# Patient Record
Sex: Male | Born: 1945 | Race: White | Hispanic: No | Marital: Married | Smoking: Current every day smoker
Health system: Southern US, Community
[De-identification: ages and names within clinical notes are randomized; demographics above are authoritative.]

## PROBLEM LIST (undated history)

## (undated) DIAGNOSIS — L409 Psoriasis, unspecified: Secondary | ICD-10-CM

## (undated) DIAGNOSIS — J449 Chronic obstructive pulmonary disease, unspecified: Secondary | ICD-10-CM

## (undated) DIAGNOSIS — E109 Type 1 diabetes mellitus without complications: Principal | ICD-10-CM

## (undated) DIAGNOSIS — M159 Polyosteoarthritis, unspecified: Secondary | ICD-10-CM

## (undated) DIAGNOSIS — L03119 Cellulitis of unspecified part of limb: Secondary | ICD-10-CM

## (undated) DIAGNOSIS — L02419 Cutaneous abscess of limb, unspecified: Secondary | ICD-10-CM

## (undated) DIAGNOSIS — G894 Chronic pain syndrome: Secondary | ICD-10-CM

## (undated) DIAGNOSIS — K143 Hypertrophy of tongue papillae: Secondary | ICD-10-CM

## (undated) DIAGNOSIS — F17200 Nicotine dependence, unspecified, uncomplicated: Secondary | ICD-10-CM

## (undated) DIAGNOSIS — I10 Essential (primary) hypertension: Secondary | ICD-10-CM

## (undated) DIAGNOSIS — E1142 Type 2 diabetes mellitus with diabetic polyneuropathy: Secondary | ICD-10-CM

## (undated) DIAGNOSIS — M5440 Lumbago with sciatica, unspecified side: Secondary | ICD-10-CM

## (undated) DIAGNOSIS — E1149 Type 2 diabetes mellitus with other diabetic neurological complication: Secondary | ICD-10-CM

## (undated) DIAGNOSIS — I872 Venous insufficiency (chronic) (peripheral): Secondary | ICD-10-CM

## (undated) DIAGNOSIS — L301 Dyshidrosis [pompholyx]: Secondary | ICD-10-CM

## (undated) DIAGNOSIS — F411 Generalized anxiety disorder: Secondary | ICD-10-CM

## (undated) DIAGNOSIS — S93409A Sprain of unspecified ligament of unspecified ankle, initial encounter: Secondary | ICD-10-CM

## (undated) HISTORY — DX: Chronic obstructive pulmonary disease, unspecified: J44.9

## (undated) HISTORY — DX: Type 2 diabetes mellitus with other diabetic neurological complication: E11.49

## (undated) HISTORY — DX: Polyosteoarthritis, unspecified: M15.9

## (undated) HISTORY — DX: Sprain of unspecified ligament of unspecified ankle, initial encounter: S93.409A

## (undated) HISTORY — DX: Hypertrophy of tongue papillae: K14.3

## (undated) HISTORY — DX: Dyshidrosis (pompholyx): L30.1

## (undated) HISTORY — DX: Cellulitis of unspecified part of limb: L03.119

## (undated) HISTORY — DX: Generalized anxiety disorder: F41.1

## (undated) HISTORY — DX: Nicotine dependence, unspecified, uncomplicated: F17.200

## (undated) HISTORY — PX: TESTICLE REMOVAL: SHX68

## (undated) HISTORY — DX: Lumbago with sciatica, unspecified side: M54.40

## (undated) HISTORY — DX: Type 2 diabetes mellitus with diabetic polyneuropathy: E11.42

## (undated) HISTORY — DX: Type 1 diabetes mellitus without complications: E10.9

## (undated) HISTORY — DX: Psoriasis, unspecified: L40.9

## (undated) HISTORY — DX: Cutaneous abscess of limb, unspecified: L02.419

## (undated) HISTORY — DX: Chronic pain syndrome: G89.4

## (undated) HISTORY — DX: Venous insufficiency (chronic) (peripheral): I87.2

## (undated) HISTORY — DX: Essential (primary) hypertension: I10

---

## 1999-04-03 ENCOUNTER — Encounter: Admission: RE | Admit: 1999-04-03 | Discharge: 1999-07-02 | Payer: Self-pay | Admitting: Family Medicine

## 1999-08-16 ENCOUNTER — Encounter: Payer: Self-pay | Admitting: Urology

## 1999-08-16 ENCOUNTER — Ambulatory Visit (HOSPITAL_COMMUNITY): Admission: RE | Admit: 1999-08-16 | Discharge: 1999-08-16 | Payer: Self-pay | Admitting: Urology

## 1999-08-25 ENCOUNTER — Ambulatory Visit (HOSPITAL_COMMUNITY): Admission: RE | Admit: 1999-08-25 | Discharge: 1999-08-25 | Payer: Self-pay | Admitting: Urology

## 2002-02-26 ENCOUNTER — Encounter: Admission: RE | Admit: 2002-02-26 | Discharge: 2002-02-26 | Payer: Self-pay | Admitting: Family Medicine

## 2002-02-26 ENCOUNTER — Encounter: Payer: Self-pay | Admitting: Family Medicine

## 2004-03-07 ENCOUNTER — Encounter: Admission: RE | Admit: 2004-03-07 | Discharge: 2004-03-07 | Payer: Self-pay | Admitting: Family Medicine

## 2004-04-18 ENCOUNTER — Ambulatory Visit: Payer: Self-pay | Admitting: Family Medicine

## 2004-05-16 ENCOUNTER — Ambulatory Visit: Payer: Self-pay | Admitting: Family Medicine

## 2004-06-13 ENCOUNTER — Ambulatory Visit: Payer: Self-pay | Admitting: Family Medicine

## 2004-10-03 ENCOUNTER — Ambulatory Visit: Payer: Self-pay | Admitting: Family Medicine

## 2004-11-07 ENCOUNTER — Ambulatory Visit: Payer: Self-pay | Admitting: Family Medicine

## 2004-12-12 ENCOUNTER — Ambulatory Visit: Payer: Self-pay | Admitting: Family Medicine

## 2005-01-30 ENCOUNTER — Ambulatory Visit: Payer: Self-pay | Admitting: Family Medicine

## 2005-04-10 ENCOUNTER — Ambulatory Visit: Payer: Self-pay | Admitting: Family Medicine

## 2005-07-24 ENCOUNTER — Ambulatory Visit: Payer: Self-pay | Admitting: Family Medicine

## 2005-07-25 ENCOUNTER — Encounter: Payer: Self-pay | Admitting: Family Medicine

## 2005-07-25 LAB — CONVERTED CEMR LAB: PSA: 0.43 ng/mL

## 2005-07-31 ENCOUNTER — Ambulatory Visit: Payer: Self-pay | Admitting: Family Medicine

## 2005-08-07 ENCOUNTER — Ambulatory Visit: Payer: Self-pay | Admitting: Family Medicine

## 2005-08-14 ENCOUNTER — Ambulatory Visit: Payer: Self-pay | Admitting: Gastroenterology

## 2005-09-04 ENCOUNTER — Encounter (INDEPENDENT_AMBULATORY_CARE_PROVIDER_SITE_OTHER): Payer: Self-pay | Admitting: Specialist

## 2005-09-04 ENCOUNTER — Ambulatory Visit: Payer: Self-pay | Admitting: Gastroenterology

## 2005-10-02 ENCOUNTER — Encounter: Admission: RE | Admit: 2005-10-02 | Discharge: 2005-10-02 | Payer: Self-pay | Admitting: Gastroenterology

## 2005-11-06 ENCOUNTER — Ambulatory Visit: Payer: Self-pay | Admitting: Family Medicine

## 2005-12-04 ENCOUNTER — Ambulatory Visit: Payer: Self-pay | Admitting: Family Medicine

## 2006-03-05 ENCOUNTER — Ambulatory Visit: Payer: Self-pay | Admitting: Family Medicine

## 2006-03-05 LAB — CONVERTED CEMR LAB
Cholesterol: 196 mg/dL (ref 0–200)
Glucose, Bld: 79 mg/dL (ref 70–99)

## 2006-09-24 ENCOUNTER — Ambulatory Visit: Payer: Self-pay | Admitting: Family Medicine

## 2006-09-24 LAB — CONVERTED CEMR LAB
Hgb A1c MFr Bld: 7.7 % — ABNORMAL HIGH (ref 4.6–6.0)
Potassium: 4.5 meq/L (ref 3.5–5.1)

## 2006-10-15 ENCOUNTER — Ambulatory Visit: Payer: Self-pay | Admitting: Family Medicine

## 2006-10-25 ENCOUNTER — Encounter: Payer: Self-pay | Admitting: Family Medicine

## 2006-10-25 DIAGNOSIS — F411 Generalized anxiety disorder: Secondary | ICD-10-CM

## 2006-10-25 DIAGNOSIS — I10 Essential (primary) hypertension: Secondary | ICD-10-CM

## 2006-10-25 DIAGNOSIS — E1149 Type 2 diabetes mellitus with other diabetic neurological complication: Secondary | ICD-10-CM

## 2006-10-25 DIAGNOSIS — J4489 Other specified chronic obstructive pulmonary disease: Secondary | ICD-10-CM

## 2006-10-25 DIAGNOSIS — J449 Chronic obstructive pulmonary disease, unspecified: Secondary | ICD-10-CM

## 2006-10-25 DIAGNOSIS — E1159 Type 2 diabetes mellitus with other circulatory complications: Secondary | ICD-10-CM

## 2006-10-25 DIAGNOSIS — E109 Type 1 diabetes mellitus without complications: Secondary | ICD-10-CM

## 2006-10-25 HISTORY — DX: Other specified chronic obstructive pulmonary disease: J44.89

## 2006-10-25 HISTORY — DX: Essential (primary) hypertension: I10

## 2006-10-25 HISTORY — DX: Type 2 diabetes mellitus with other diabetic neurological complication: E11.49

## 2006-10-25 HISTORY — DX: Chronic obstructive pulmonary disease, unspecified: J44.9

## 2006-10-25 HISTORY — DX: Generalized anxiety disorder: F41.1

## 2006-10-25 HISTORY — DX: Type 1 diabetes mellitus without complications: E10.9

## 2006-12-16 ENCOUNTER — Ambulatory Visit: Payer: Self-pay | Admitting: Family Medicine

## 2006-12-16 DIAGNOSIS — S93409A Sprain of unspecified ligament of unspecified ankle, initial encounter: Secondary | ICD-10-CM

## 2006-12-16 HISTORY — DX: Sprain of unspecified ligament of unspecified ankle, initial encounter: S93.409A

## 2006-12-31 ENCOUNTER — Ambulatory Visit: Payer: Self-pay | Admitting: Family Medicine

## 2006-12-31 LAB — CONVERTED CEMR LAB
BUN: 8 mg/dL (ref 6–23)
CO2: 29 meq/L (ref 19–32)
Calcium: 9.3 mg/dL (ref 8.4–10.5)
Chloride: 99 meq/L (ref 96–112)
Creatinine, Ser: 0.8 mg/dL (ref 0.4–1.5)
GFR calc Af Amer: 126 mL/min
GFR calc non Af Amer: 104 mL/min
Glucose, Bld: 117 mg/dL — ABNORMAL HIGH (ref 70–99)
Hgb A1c MFr Bld: 6.7 % — ABNORMAL HIGH (ref 4.6–6.0)
Potassium: 4.4 meq/L (ref 3.5–5.1)
Sodium: 136 meq/L (ref 135–145)

## 2007-04-01 ENCOUNTER — Ambulatory Visit: Payer: Self-pay | Admitting: Family Medicine

## 2007-04-03 ENCOUNTER — Ambulatory Visit: Payer: Self-pay | Admitting: Family Medicine

## 2007-04-03 LAB — CONVERTED CEMR LAB
Alkaline Phosphatase: 26 units/L — ABNORMAL LOW (ref 39–117)
BUN: 13 mg/dL (ref 6–23)
Basophils Relative: 0.4 % (ref 0.0–1.0)
Bilirubin, Direct: 0.1 mg/dL (ref 0.0–0.3)
CO2: 30 meq/L (ref 19–32)
Cholesterol: 219 mg/dL (ref 0–200)
Eosinophils Absolute: 0.3 10*3/uL (ref 0.0–0.6)
GFR calc Af Amer: 126 mL/min
GFR calc non Af Amer: 104 mL/min
HDL: 97.2 mg/dL (ref >39.0)
Hemoglobin: 14.7 g/dL (ref 13.0–17.0)
Lymphocytes Relative: 42.7 % (ref 12.0–46.0)
MCHC: 34.4 g/dL (ref 30.0–36.0)
MCV: 99.3 fL (ref 78.0–100.0)
Microalb Creat Ratio: 14.3 mg/g (ref 0.0–30.0)
Microalb, Ur: 0.5 mg/dL (ref 0.0–1.9)
Monocytes Absolute: 0.7 10*3/uL (ref 0.2–0.7)
Monocytes Relative: 9.1 % (ref 3.0–11.0)
Neutro Abs: 3.1 10*3/uL (ref 1.4–7.7)
Potassium: 4.3 meq/L (ref 3.5–5.1)
Total Protein: 7.1 g/dL (ref 6.0–8.3)
VLDL: 11 mg/dL (ref 0–40)

## 2007-04-08 ENCOUNTER — Ambulatory Visit: Payer: Self-pay | Admitting: Family Medicine

## 2007-04-08 DIAGNOSIS — E1142 Type 2 diabetes mellitus with diabetic polyneuropathy: Secondary | ICD-10-CM

## 2007-04-08 HISTORY — DX: Type 2 diabetes mellitus with diabetic polyneuropathy: E11.42

## 2007-06-25 ENCOUNTER — Ambulatory Visit: Payer: Self-pay | Admitting: Family Medicine

## 2007-07-08 ENCOUNTER — Ambulatory Visit: Payer: Self-pay | Admitting: Family Medicine

## 2007-07-08 DIAGNOSIS — F17200 Nicotine dependence, unspecified, uncomplicated: Secondary | ICD-10-CM

## 2007-07-08 HISTORY — DX: Nicotine dependence, unspecified, uncomplicated: F17.200

## 2007-07-25 ENCOUNTER — Telehealth: Payer: Self-pay | Admitting: Family Medicine

## 2007-09-08 ENCOUNTER — Inpatient Hospital Stay (HOSPITAL_COMMUNITY): Admission: AD | Admit: 2007-09-08 | Discharge: 2007-09-12 | Payer: Self-pay | Admitting: Family Medicine

## 2007-09-08 ENCOUNTER — Ambulatory Visit: Payer: Self-pay | Admitting: Family Medicine

## 2007-09-08 DIAGNOSIS — L03119 Cellulitis of unspecified part of limb: Secondary | ICD-10-CM

## 2007-09-08 DIAGNOSIS — L02419 Cutaneous abscess of limb, unspecified: Secondary | ICD-10-CM

## 2007-09-08 HISTORY — DX: Cutaneous abscess of limb, unspecified: L02.419

## 2007-09-08 HISTORY — DX: Cellulitis of unspecified part of limb: L03.119

## 2007-09-09 ENCOUNTER — Encounter: Payer: Self-pay | Admitting: Internal Medicine

## 2007-09-09 ENCOUNTER — Ambulatory Visit: Payer: Self-pay | Admitting: Surgery

## 2007-09-15 ENCOUNTER — Ambulatory Visit: Payer: Self-pay | Admitting: Family Medicine

## 2007-09-19 ENCOUNTER — Ambulatory Visit: Payer: Self-pay | Admitting: Family Medicine

## 2007-10-15 ENCOUNTER — Telehealth: Payer: Self-pay | Admitting: *Deleted

## 2007-10-21 ENCOUNTER — Ambulatory Visit: Payer: Self-pay | Admitting: Family Medicine

## 2007-11-11 DIAGNOSIS — K143 Hypertrophy of tongue papillae: Secondary | ICD-10-CM

## 2007-11-11 HISTORY — DX: Hypertrophy of tongue papillae: K14.3

## 2007-11-25 ENCOUNTER — Ambulatory Visit: Payer: Self-pay | Admitting: Family Medicine

## 2007-11-25 DIAGNOSIS — I872 Venous insufficiency (chronic) (peripheral): Secondary | ICD-10-CM

## 2007-11-25 HISTORY — DX: Venous insufficiency (chronic) (peripheral): I87.2

## 2007-12-02 ENCOUNTER — Ambulatory Visit: Payer: Self-pay | Admitting: Family Medicine

## 2007-12-02 LAB — CONVERTED CEMR LAB
BUN: 12 mg/dL (ref 6–23)
CO2: 29 meq/L (ref 19–32)
Chloride: 99 meq/L (ref 96–112)
Creatinine, Ser: 0.8 mg/dL (ref 0.4–1.5)

## 2007-12-04 ENCOUNTER — Telehealth: Payer: Self-pay | Admitting: *Deleted

## 2008-03-05 ENCOUNTER — Telehealth: Payer: Self-pay | Admitting: Family Medicine

## 2008-03-10 ENCOUNTER — Ambulatory Visit: Payer: Self-pay | Admitting: Family Medicine

## 2008-05-11 ENCOUNTER — Telehealth: Payer: Self-pay | Admitting: *Deleted

## 2008-06-08 ENCOUNTER — Ambulatory Visit: Payer: Self-pay | Admitting: Family Medicine

## 2008-06-08 DIAGNOSIS — L301 Dyshidrosis [pompholyx]: Secondary | ICD-10-CM | POA: Insufficient documentation

## 2008-06-08 HISTORY — DX: Dyshidrosis (pompholyx): L30.1

## 2008-06-08 LAB — CONVERTED CEMR LAB
BUN: 23 mg/dL (ref 6–23)
CO2: 32 meq/L (ref 19–32)
Chloride: 101 meq/L (ref 96–112)
Creatinine, Ser: 1.1 mg/dL (ref 0.4–1.5)
Glucose, Bld: 98 mg/dL (ref 70–99)

## 2008-07-08 ENCOUNTER — Telehealth: Payer: Self-pay | Admitting: *Deleted

## 2008-08-11 ENCOUNTER — Telehealth: Payer: Self-pay | Admitting: Family Medicine

## 2008-09-30 ENCOUNTER — Ambulatory Visit: Payer: Self-pay | Admitting: Family Medicine

## 2008-09-30 ENCOUNTER — Telehealth: Payer: Self-pay | Admitting: Family Medicine

## 2008-09-30 LAB — CONVERTED CEMR LAB
Basophils Relative: 0.7 % (ref 0.0–3.0)
Bilirubin, Direct: 0.1 mg/dL (ref 0.0–0.3)
Blood in Urine, dipstick: NEGATIVE
Calcium: 9.5 mg/dL (ref 8.4–10.5)
Direct LDL: 141.8 mg/dL
Eosinophils Absolute: 0.3 10*3/uL (ref 0.0–0.7)
Eosinophils Relative: 3.9 % (ref 0.0–5.0)
GFR calc non Af Amer: 80.2 mL/min (ref >60)
Glucose, Bld: 94 mg/dL (ref 70–99)
HDL: 47.5 mg/dL (ref >39.00)
Hemoglobin: 13 g/dL (ref 13.0–17.0)
Ketones, urine, test strip: NEGATIVE
Lymphocytes Relative: 44.5 % (ref 12.0–46.0)
MCHC: 34.3 g/dL (ref 30.0–36.0)
Monocytes Relative: 8.6 % (ref 3.0–12.0)
Neutro Abs: 3 10*3/uL (ref 1.4–7.7)
Nitrite: NEGATIVE
Nitrite: NEGATIVE
Protein, U semiquant: NEGATIVE
RBC: 4.04 M/uL — ABNORMAL LOW (ref 4.22–5.81)
Sodium: 142 meq/L (ref 135–145)
Specific Gravity, Urine: <1.005
Specific Gravity, Urine: <=1.005 (ref 1.000–1.030)
Total CHOL/HDL Ratio: 4
Total Protein, Urine: NEGATIVE mg/dL
Total Protein: 7.2 g/dL (ref 6.0–8.3)
Triglycerides: 63 mg/dL (ref 0.0–149.0)
Urobilinogen, UA: 0.2
pH: 6 (ref 5.0–8.0)

## 2008-10-05 ENCOUNTER — Ambulatory Visit: Payer: Self-pay | Admitting: Family Medicine

## 2008-11-18 ENCOUNTER — Ambulatory Visit: Payer: Self-pay | Admitting: Endocrinology

## 2008-12-07 ENCOUNTER — Ambulatory Visit: Payer: Self-pay | Admitting: Endocrinology

## 2009-02-01 ENCOUNTER — Ambulatory Visit: Payer: Self-pay | Admitting: Endocrinology

## 2009-02-16 ENCOUNTER — Telehealth: Payer: Self-pay | Admitting: Family Medicine

## 2009-02-23 ENCOUNTER — Ambulatory Visit: Payer: Self-pay | Admitting: Endocrinology

## 2009-03-16 ENCOUNTER — Ambulatory Visit: Payer: Self-pay | Admitting: Endocrinology

## 2009-04-06 ENCOUNTER — Ambulatory Visit: Payer: Self-pay | Admitting: Endocrinology

## 2009-05-05 ENCOUNTER — Telehealth: Payer: Self-pay | Admitting: Family Medicine

## 2009-05-11 ENCOUNTER — Ambulatory Visit: Payer: Self-pay | Admitting: Endocrinology

## 2009-05-24 ENCOUNTER — Telehealth: Payer: Self-pay | Admitting: Family Medicine

## 2009-08-16 ENCOUNTER — Ambulatory Visit: Payer: Self-pay | Admitting: Endocrinology

## 2009-08-16 LAB — CONVERTED CEMR LAB
Creatinine,U: 27.9 mg/dL
Microalb Creat Ratio: 7.2 mg/g (ref 0.0–30.0)
Microalb, Ur: 0.2 mg/dL (ref 0.0–1.9)

## 2009-09-07 LAB — HM DIABETES EYE EXAM: HM Diabetic Eye Exam: NORMAL

## 2009-10-12 ENCOUNTER — Telehealth: Payer: Self-pay | Admitting: Family Medicine

## 2009-11-30 ENCOUNTER — Telehealth: Payer: Self-pay | Admitting: Family Medicine

## 2009-12-13 ENCOUNTER — Ambulatory Visit: Payer: Self-pay | Admitting: Endocrinology

## 2009-12-28 ENCOUNTER — Telehealth: Payer: Self-pay | Admitting: Family Medicine

## 2010-03-02 ENCOUNTER — Telehealth: Payer: Self-pay | Admitting: Endocrinology

## 2010-03-03 ENCOUNTER — Telehealth: Payer: Self-pay | Admitting: Endocrinology

## 2010-03-29 ENCOUNTER — Ambulatory Visit: Payer: Self-pay | Admitting: Family Medicine

## 2010-03-29 LAB — CONVERTED CEMR LAB
ALT: 16 units/L (ref 0–53)
AST: 20 units/L (ref 0–37)
BUN: 20 mg/dL (ref 6–23)
Creatinine,U: 125.2 mg/dL
Eosinophils Relative: 3 % (ref 0.0–5.0)
GFR calc non Af Amer: 72.26 mL/min (ref >60)
HCT: 41.9 % (ref 39.0–52.0)
Hemoglobin: 14.1 g/dL (ref 13.0–17.0)
LDL Cholesterol: 115 mg/dL — ABNORMAL HIGH (ref 0–99)
Lymphs Abs: 3.4 10*3/uL (ref 0.7–4.0)
Microalb Creat Ratio: 0.4 mg/g (ref 0.0–30.0)
Monocytes Relative: 9 % (ref 3.0–12.0)
Nitrite: NEGATIVE
Platelets: 222 10*3/uL (ref 150.0–400.0)
Potassium: 4.8 meq/L (ref 3.5–5.1)
Protein, U semiquant: NEGATIVE
RBC: 4.43 M/uL (ref 4.22–5.81)
Sodium: 139 meq/L (ref 135–145)
TSH: 0.97 microintl units/mL (ref 0.35–5.50)
Total Bilirubin: 0.4 mg/dL (ref 0.3–1.2)
Total CHOL/HDL Ratio: 3
Urobilinogen, UA: 0.2
VLDL: 15.4 mg/dL (ref 0.0–40.0)
WBC Urine, dipstick: NEGATIVE
WBC: 6.9 10*3/uL (ref 4.5–10.5)

## 2010-04-05 ENCOUNTER — Encounter: Payer: Self-pay | Admitting: Family Medicine

## 2010-04-05 ENCOUNTER — Ambulatory Visit: Payer: Self-pay | Admitting: Family Medicine

## 2010-04-07 ENCOUNTER — Telehealth: Payer: Self-pay | Admitting: Family Medicine

## 2010-04-18 ENCOUNTER — Ambulatory Visit: Payer: Self-pay | Admitting: Endocrinology

## 2010-06-27 NOTE — Progress Notes (Signed)
Summary: LANTUS  Phone Note Call from Patient Call back at Home Phone [phone] Call back at [phone]   Caller: Patient Summary of Call: NEEDS LANTUS SENT TO [facility] INSTEAD OF [facility] Initial call taken by: Hilarie Fredrickson,  March 03, 2010 11:19 AM    Prescriptions: LANTUS 100 UNIT/ML SOLN (INSULIN GLARGINE) Inject 10 units at night  #52mth x 3   Entered by:   Margaret Pyle, CMA   Authorized by:   Minus Breeding MD   Signed by:   Margaret Pyle, CMA on 03/03/2010   Method used:   Electronically to        [facility]* (retail)             ,          Ph: [phone]       Fax: [phone]   RxID:   [id number]

## 2010-06-27 NOTE — Progress Notes (Signed)
Summary: please  call  Phone Note Call from Patient Call back at Home Phone [phone]   Caller: Patient Call For: Roderick Pee MD Summary of Call: pt would liike rachel to return call Initial call taken by: Heron Sabins,  April 07, 2010 11:44 AM  Follow-up for Phone Call        [facility] please call Follow-up by: Roderick Pee MD,  April 10, 2010 8:27 AM    Prescriptions: TRIAMCINOLONE ACETONIDE 0.1 % OINT (TRIAMCINOLONE ACETONIDE) apply two times a day  #1pound jar x 3   Entered by:   Kern Reap CMA (AAMA)   Authorized by:   Roderick Pee MD   Signed by:   Kern Reap CMA (AAMA) on 04/10/2010   Method used:   Electronically to        [facility]* (retail)             ,          Ph: [phone]       Fax: [phone]   RxID:   [id number] GABAPENTIN 300 MG CAPS (GABAPENTIN) Take 1 capsule by mouth three times a day  #300 x 3   Entered by:   Kern Reap CMA (AAMA)   Authorized by:   Roderick Pee MD   Signed by:   Kern Reap CMA (AAMA) on 04/10/2010   Method used:   Electronically to        [facility]* (retail)             ,          Ph: [phone]       Fax: [phone]   RxID:   [id number] FUROSEMIDE 20 MG  TABS (FUROSEMIDE) take one tab in the evening  #100 x 3   Entered by:   Kern Reap CMA (AAMA)   Authorized by:   Roderick Pee MD   Signed by:   Kern Reap CMA (AAMA) on 04/10/2010   Method used:   Electronically to        [facility]* (retail)             ,          Ph: [phone]       Fax: [phone]   RxID:   [id number] IBUPROFEN 800 MG TABS (IBUPROFEN) Take 1 tablet by mouth twice a day  #200 x 3   Entered by:   Kern Reap CMA (AAMA)   Authorized by:   Roderick Pee MD   Signed by:   Kern Reap CMA (AAMA) on 04/10/2010   Method used:   Electronically to        [facility]* (retail)             ,          Ph: [phone]       Fax: [phone]   RxID:   [id number]

## 2010-06-27 NOTE — Progress Notes (Signed)
Summary: accu-chek tests strips refill  Phone Note Refill Request Message from:  Fax from Pharmacy on December 28, 2009 12:52 PM  Refills Requested: Medication #1:  ACCU-CHEK AVIVA  STRP use four times a day to check glucose levels Initial call taken by: Kern Reap CMA Duncan Dull),  December 28, 2009 12:52 PM    Prescriptions: ACCU-CHEK AVIVA  STRP (GLUCOSE BLOOD) use four times a day to check glucose levels  #360 x 3   Entered by:   Kern Reap CMA (AAMA)   Authorized by:   Roderick Pee MD   Signed by:   Kern Reap CMA (AAMA) on 12/28/2009   Method used:   Electronically to        [facility]* (retail)       [address]       [place], [place]  [id number]       Ph: [phone]       Fax: [phone]   RxID:   [id number]

## 2010-06-27 NOTE — Progress Notes (Signed)
Summary: REFILL REQUEST  Phone Note Refill Request Message from:  Patient on November 30, 2009 3:36 PM  Refills Requested: Medication #1:  VICODIN ES 7.5-750 MG  TABS Take 1 tablet by mouth three times a day   Notes: Please fax to [facility].    Initial call taken by: Debbra Riding,  November 30, 2009 3:37 PM  Follow-up for Phone Call        Fleet Contras, pls  research this Follow-up by: Roderick Pee MD,  December 01, 2009 11:10 AM  Additional Follow-up for Phone Call Additional follow up Details #1::        patient was taking darvocet for pain but rx was changed to viocodin.  the last fill was 12/10.  he will come to the office to pick this rx up. Additional Follow-up by: Kern Reap CMA Duncan Dull),  December 01, 2009 2:39 PM    Additional Follow-up for Phone Call Additional follow up Details #2::    okay to refill enough until next physical Follow-up by: Roderick Pee MD,  December 02, 2009 8:46 AM  Additional Follow-up for Phone Call Additional follow up Details #3:: Details for Additional Follow-up Action Taken: faxed to [name] Additional Follow-up by: Kern Reap CMA Duncan Dull),  December 02, 2009 3:02 PM  Prescriptions: VICODIN ES 7.5-750 MG  TABS (HYDROCODONE-ACETAMINOPHEN) Take 1 tablet by mouth three times a day  #90 x 3   Entered by:   Kern Reap CMA (AAMA)   Authorized by:   Roderick Pee MD   Signed by:   Kern Reap CMA (AAMA) on 12/02/2009   Method used:   Print then Give to Patient   RxID:   [id number]

## 2010-06-27 NOTE — Assessment & Plan Note (Signed)
Summary: 3 MOS F/U PER PT/CD   Vital Signs:  Patient profile:   65 year old male Height:      72 inches (182.88 cm) Weight:      199.50 pounds (90.68 kg) O2 Sat:      99 % on Room air Temp:     97.8 degrees F (36.56 degrees C) oral Pulse rate:   84 / minute BP sitting:   128 / 64  (left arm) Cuff size:   regular  Vitals Entered By: Josph Macho RMA (August 16, 2009 10:27 AM)  O2 Flow:  Room air CC: 3 month follow up/ Pt states he is no longer taking Darvocet/ CF Is Patient Diabetic? Yes   Referring Provider:  Dr Mohammed Kindle Primary Provider:  Dr Mohammed Kindle  CC:  3 month follow up/ Pt states he is no longer taking Darvocet/ CF.  History of Present Illness: he brings a record of his cbg's which i have reviewed today.  pt states he feels well in general, except for hypoglycemia in the middle of the night, approx 1/week.  he still takes lantus 15 units at bedtime. pt states few years of severe itching of the legs.  there is an intermittently associated rash.  Current Medications (verified): 1)  Accu-Chek Multiclix Lancets  Misc (Lancets) .... Use 1 Needle Three Times A Day 2)  Amitriptyline Hcl 10 Mg Tabs (Amitriptyline Hcl) .Marland Kitchen.. 1 Tablet By Mouth At Bedtime 3)  Buspirone Hcl 30 Mg Tabs (Buspirone Hcl) .... Take 1 Tablet By Mouth Twice A Day 4)  Clonidine Hcl 0.1 Mg Tabs (Clonidine Hcl) .... Take 1 Tablet By Mouth Every Night 5)  Gabapentin 300 Mg Caps (Gabapentin) .... Take 1 Capsule By Mouth Three Times A Day 6)  Ibuprofen 800 Mg Tabs (Ibuprofen) .... Take 1 Tablet By Mouth Twice A Day 7)  Lantus 100 Unit/ml Soln (Insulin Glargine) .... Inject 10 Units At Night 8)  Lisinopril 40 Mg Tabs (Lisinopril) .Marland Kitchen.. 1 Tablet By Mouth Twice A Day 9)  Rhinocort Aqua 32 Mcg/act Susp (Budesonide (Nasal)) .... Inhale 2 Spray Into Both Nostrils Once A Day 10)  Darvocet-N 100 100-650 Mg  Tabs (Propoxyphene N-Apap) .... One Three Times Daily 11)  Adult Aspirin Low Strength 81 Mg  Tbdp (Aspirin)  .... One  Mon & Fri 12)  Vicodin Es 7.5-750 Mg  Tabs (Hydrocodone-Acetaminophen) .... Take 1 Tablet By Mouth Three Times A Day 13)  Furosemide 20 Mg  Tabs (Furosemide) .... Take One Tab in The Evening 14)  Zyrtec Allergy 10 Mg  Tabs (Cetirizine Hcl) .... Once Daily 15)  Bd Insulin Syringe Ultrafine 31g X 5/16" 0.5 Ml  Misc (Insulin Syringe-Needle U-100) .... Qid 16)  Accu-Chek Aviva  Strp (Glucose Blood) .... Use Four Times A Day To Check Glucose Levels 17)  Lidex 0.05 % Crea (Fluocinonide) .... Apply At Bedtime 18)  Triamcinolone Acetonide 0.1 % Oint (Triamcinolone Acetonide) .... Apply Two Times A Day 19)  Novolog 100 Unit/ml Soln (Insulin Aspart) .... Three Times A Day (Just Before Each Meal) 05-08-13 Units  Allergies (verified): No Known Drug Allergies  Past History:  Past Medical History: Last updated: 10/25/2006 Diabetes mellitus, type I PSORIASIS TA COPD PERIPHERAL NEUROPATHY Hypertension Anxiety  Review of Systems  The patient denies syncope.    Physical Exam  General:  normal appearance.   Skin:  on the legs, i do not see a rash Additional Exam:   Hemoglobin A1C       [H]  7.8 %   Impression & Recommendations:  Problem # 1:  DIABETES MELLITUS, TYPE I (ICD-250.01) needs increased rx  Problem # 2:  DYSHIDROSIS (ICD-705.81) needs increased rx  Medications Added to Medication List This Visit: 1)  Humalog 100 Unit/ml Soln (Insulin lispro (human)) .... Three times a day (just before each meal) 05-08-13 units 2)  Humalog 100 Unit/ml Soln (Insulin lispro (human)) .... Three times a day (just before each meal) 13-13-15 units 3)  Fluocinonide 0.05 % Crea (Fluocinonide) .... Three times a day as needed for itching  Other Orders: TLB-A1C / Hgb A1C (Glycohemoglobin) (83036-A1C) TLB-Microalbumin/Creat Ratio, Urine (82043-MALB) Est. Patient Level IV (54098)  Patient Instructions: 1)  reduce lantus to 10 units at night only. 2)  increase humalog to just before each  meal 05-08-13 units. 3)  Please schedule a follow-up appointment in 3 months. 4)  call sooner if sugar is below 70. 5)  check your blood sugar 4 times a day--before the 3 meals, and at bedtime.  also check if you have symptoms of your blood sugar being too high or too low.  please keep a record of the readings and bring it to your next appointment here.  please call us sooner if you are having low blood sugar episodes. 6)  lidex cream three times a day as needed for itching 7)  (update: i left message on phone-tree:  lastus as above.  increase humalog to (just before each meal) 13-13-15 units). Prescriptions: HUMALOG 100 UNIT/ML SOLN (INSULIN LISPRO (HUMAN)) three times a day (just before each meal) 05-08-13 units  #4 vials x 3   Entered and Authorized by:   Minus Breeding MD   Signed by:   Minus Breeding MD on 08/16/2009   Method used:   Electronically to        [facility]* (mail-order)             ,          Ph: [phone]       Fax: [phone]   RxID:   [id number] FLUOCINONIDE 0.05 % CREA (FLUOCINONIDE) three times a day as needed for itching  #1 large tube x 3   Entered and Authorized by:   Minus Breeding MD   Signed by:   Minus Breeding MD on 08/16/2009   Method used:   Electronically to        [facility]* (mail-order)             ,          Ph: [phone]       Fax: [phone]   RxID:   [id number]

## 2010-06-27 NOTE — Progress Notes (Signed)
Summary: refill request  Phone Note Refill Request   Refills Requested: Medication #1:  AMITRIPTYLINE HCL 10 MG TABS 1 tablet by mouth at bedtime  Medication #2:  CLONIDINE HCL 0.1 MG TABS Take 1 tablet by mouth every night  Medication #3:  IBUPROFEN 800 MG TABS Take 1 tablet by mouth twice a day Initial call taken by: Kern Reap CMA Duncan Dull),  Oct 12, 2009 11:44 AM    Prescriptions: IBUPROFEN 800 MG TABS (IBUPROFEN) Take 1 tablet by mouth twice a day  #200 x 11   Entered by:   Kern Reap CMA (AAMA)   Authorized by:   Roderick Pee MD   Signed by:   Kern Reap CMA (AAMA) on 10/12/2009   Method used:   Electronically to        [facility]* (mail-order)             ,          Ph: [phone]       Fax: [phone]   RxID:   [id number] CLONIDINE HCL 0.1 MG TABS (CLONIDINE HCL) Take 1 tablet by mouth every night  #100 x 4   Entered by:   Kern Reap CMA (AAMA)   Authorized by:   Roderick Pee MD   Signed by:   Kern Reap CMA (AAMA) on 10/12/2009   Method used:   Electronically to        [facility]* (mail-order)             ,          Ph: [phone]       Fax: [phone]   RxID:   [id number] AMITRIPTYLINE HCL 10 MG TABS (AMITRIPTYLINE HCL) 1 tablet by mouth at bedtime  #100 x 4   Entered by:   Kern Reap CMA (AAMA)   Authorized by:   Roderick Pee MD   Signed by:   Kern Reap CMA (AAMA) on 10/12/2009   Method used:   Electronically to        [facility]* (mail-order)             ,          Ph: [phone]       Fax: [phone]   RxID:   [id number]

## 2010-06-27 NOTE — Assessment & Plan Note (Signed)
Summary: cpx labs v70.0/njr   Vital Signs:  Patient profile:   65 year old male Height:      72 inches Weight:      196 pounds BMI:     26.68 Temp:     98.6 degrees F oral BP sitting:   128 / 80  (left arm) Cuff size:   regular  Vitals Entered By: Kern Reap CMA Duncan Dull) (April 05, 2010 10:01 AM) CC: cpx   Primary Care Provider:  Dr Mohammed Kindle  CC:  cpx.  History of Present Illness: Jeremy Gill is a 65 year old male, smoker, type I diabetic with underlying hypertension, COPD, neuropathy from diabetes, who comes in today for general physical examination  His medications were reviewed in detail.  He is currently seeing Dr. Everardo All for treatment of his diabetes.  He takes 10 units of Lantus at bedtime and Humalog sliding scale prior to each meal.  However, blood sugar daily.  He shows me there is tremendous sleep.  In the morning.  His blood sugar is 260 in the afternoon at 72 and then at night time.  Its to 80.  The rest of his medications are static.  He continues to smoke and declines a smoking cessation program.  Tetanus 2003, Pneumovax 2000, seasonal flu shot today.  Preventive Screening-Counseling & Management  Alcohol-Tobacco     Smoking Cessation Counseling: yes  Allergies: No Known Drug Allergies  Past History:  Past medical, surgical, family and social histories (including risk factors) reviewed, and no changes noted (except as noted below).  Past Medical History: Reviewed history from 10/25/2006 and no changes required. Diabetes mellitus, type I PSORIASIS TA COPD PERIPHERAL NEUROPATHY Hypertension Anxiety  Past Surgical History: Reviewed history from 10/25/2006 and no changes required. R TESTICLE REMOVED  Family History: Reviewed history from 11/18/2008 and no changes required. father died in his 62s, unknown cause mother died at 64 CNS aneurysm, cancer, unknown primary 4 brothers, two in good health.  One died of old age 27 was killed 3 sisters  one died of old age.  Two, alive and well sister has dm  Social History: Reviewed history from 11/18/2008 and no changes required. Married Current Smoker Alcohol use-no Drug use-no Regular exercise-no self-employed  Review of Systems      See HPI       Flu Vaccine Consent Questions     Do you have a history of severe allergic reactions to this vaccine? no    Any prior history of allergic reactions to egg and/or gelatin? no    Do you have a sensitivity to the preservative Thimersol? no    Do you have a past history of Guillan-Barre Syndrome? no    Do you currently have an acute febrile illness? no    Have you ever had a severe reaction to latex? no    Vaccine information given and explained to patient? yes    Are you currently pregnant? no    Lot Number:[id number]   Exp Date:11/25/2010   Site Given  Left Deltoid IM   Physical Exam  General:  Well-developed,well-nourished,in no acute distress; alert,appropriate and cooperative throughout examination Head:  Normocephalic and atraumatic without obvious abnormalities. No apparent alopecia or balding. Eyes:  No corneal or conjunctival inflammation noted. EOMI. Perrla. Funduscopic exam benign, without hemorrhages, exudates or papilledema. Vision grossly normal. Ears:  External ear exam shows no significant lesions or deformities.  Otoscopic examination reveals clear canals, tympanic membranes are intact bilaterally without bulging, retraction, inflammation or discharge.  Hearing is grossly normal bilaterally. Nose:  External nasal examination shows no deformity or inflammation. Nasal mucosa are pink and moist without lesions or exudates. Mouth:  Oral mucosa and oropharynx without lesions or exudates.  Teeth in good repair. Neck:  No deformities, masses, or tenderness noted. Chest Wall:  No deformities, masses, tenderness or gynecomastia noted. Breasts:  No masses or gynecomastia noted Lungs:  Normal respiratory effort, chest expands  symmetrically. Lungs are clear to auscultation, no crackles or wheezes. Heart:  Normal rate and regular rhythm. S1 and S2 normal without gallop, murmur, click, rub or other extra sounds. Abdomen:  Bowel sounds positive,abdomen soft and non-tender without masses, organomegaly or hernias noted. Rectal:  No external abnormalities noted. Normal sphincter tone. No rectal masses or tenderness. Genitalia:  left testicle normal.  Right was surgically removed Prostate:  Prostate gland firm and smooth, no enlargement, nodularity, tenderness, mass, asymmetry or induration. Msk:  No deformity or scoliosis noted of thoracic or lumbar spine.   Pulses:  R and L carotid,radial,femoral,dorsalis pedis and posterior tibial pulses are full and equal bilaterally Extremities:  No clubbing, cyanosis, edema, or deformity noted with normal full range of motion of all joints.   Neurologic:  No cranial nerve deficits noted. Station and gait are normal. Plantar reflexes are down-going bilaterally. DTRs are symmetrical throughout. Sensory, motor and coordinative functions appear intact. Skin:  Intact without suspicious lesions or rashes Cervical Nodes:  No lymphadenopathy noted Axillary Nodes:  No palpable lymphadenopathy Inguinal Nodes:  No significant adenopathy Psych:  Cognition and judgment appear intact. Alert and cooperative with normal attention span and concentration. No apparent delusions, illusions, hallucinations  Diabetes Management Exam:    Foot Exam (with socks and/or shoes not present):       Sensory-Pinprick/Light touch:          Left medial foot (L-4): normal          Left dorsal foot (L-5): normal          Left lateral foot (S-1): normal          Right medial foot (L-4): normal          Right dorsal foot (L-5): normal          Right lateral foot (S-1): normal       Sensory-Monofilament:          Left foot: normal          Right foot: normal       Inspection:          Left foot: normal           Right foot: normal       Nails:          Left foot: normal          Right foot: normal    Eye Exam:       Eye Exam done elsewhere          Date: 09/07/2009          Results: normal          Done by: opth   Impression & Recommendations:  Problem # 1:  TOBACCO ABUSE (ICD-305.1) Assessment Unchanged  Orders: Prescription Created Electronically [phone]) Tobacco use cessation intermediate 3-10 minutes (60454)  Problem # 2:  HYPERTENSION (ICD-401.9) Assessment: Improved  His updated medication list for this problem includes:    Clonidine Hcl 0.1 Mg Tabs (Clonidine hcl) .Marland Kitchen... Take 1 tablet by mouth every night    Lisinopril 40  Mg Tabs (Lisinopril) .Marland Kitchen... 1 tablet by mouth twice a day    Furosemide 20 Mg Tabs (Furosemide) .Marland Kitchen... Take one tab in the evening  Orders: Prescription Created Electronically [phone]) Tobacco use cessation intermediate 3-10 minutes (99406) EKG w/ Interpretation (93000)  Problem # 3:  DIABETES MELLITUS, TYPE I (ICD-250.01) Assessment: Unchanged  His updated medication list for this problem includes:    Lantus 100 Unit/ml Soln (Insulin glargine) ..... Inject 10 units at night    Lisinopril 40 Mg Tabs (Lisinopril) .Marland Kitchen... 1 tablet by mouth twice a day    Adult Aspirin Low Strength 81 Mg Tbdp (Aspirin) ..... One  mon & fri    Humalog 100 Unit/ml Soln (Insulin lispro (human)) .Marland Kitchen... Three times a day (just before each meal) 13-13-15 units  Orders: Prescription Created Electronically [phone]) Tobacco use cessation intermediate 3-10 minutes (57846)  Problem # 4:  Preventive Health Care (ICD-V70.0) Assessment: Unchanged  Complete Medication List: 1)  Accu-chek Multiclix Lancets Misc (Lancets) .... Use 1 needle three times a day 2)  Amitriptyline Hcl 10 Mg Tabs (Amitriptyline hcl) .Marland Kitchen.. 1 tablet by mouth at bedtime 3)  Buspirone Hcl 30 Mg Tabs (Buspirone hcl) .... Take 1 tablet by mouth twice a day 4)  Clonidine Hcl 0.1 Mg Tabs (Clonidine hcl) .... Take 1 tablet by  mouth every night 5)  Gabapentin 300 Mg Caps (Gabapentin) .... Take 1 capsule by mouth three times a day 6)  Ibuprofen 800 Mg Tabs (Ibuprofen) .... Take 1 tablet by mouth twice a day 7)  Lantus 100 Unit/ml Soln (Insulin glargine) .... Inject 10 units at night 8)  Lisinopril 40 Mg Tabs (Lisinopril) .Marland Kitchen.. 1 tablet by mouth twice a day 9)  Rhinocort Aqua 32 Mcg/act Susp (Budesonide (nasal)) .... Inhale 2 spray into both nostrils once a day 10)  Adult Aspirin Low Strength 81 Mg Tbdp (Aspirin) .... One  mon & fri 11)  Vicodin Es 7.5-750 Mg Tabs (Hydrocodone-acetaminophen) .... Take 1 tablet by mouth three times a day 12)  Furosemide 20 Mg Tabs (Furosemide) .... Take one tab in the evening 13)  Zyrtec Allergy 10 Mg Tabs (Cetirizine hcl) .... Once daily 14)  Bd Insulin Syringe Ultrafine 31g X 5/16" 0.5 Ml Misc (Insulin syringe-needle u-100) .... Qid 15)  Accu-chek Aviva Strp (Glucose blood) .... Use four times a day to check glucose levels 16)  Triamcinolone Acetonide 0.1 % Oint (Triamcinolone acetonide) .... Apply two times a day 17)  Humalog 100 Unit/ml Soln (Insulin lispro (human)) .... Three times a day (just before each meal) 13-13-15 units 18)  Fluocinonide 0.05 % Crea (Fluocinonide) .... Three times a day as needed for itching  Other Orders: Admin 1st Vaccine (96295) Flu Vaccine 69yrs + (28413)  Patient Instructions: 1)  Please schedule a follow-up appointment in 1 year. 2)  Tobacco is very bad for your health and your loved ones! You Should stop smoking!. 3)  Stop Smoking Tips: Choose a Quit date. Cut down before the Quit date. decide what you will do as a substitute when you feel the urge to smoke(gum,toothpick,exercise). 4)  It is important that you exercise regularly at least 20 minutes 5 times a week. If you develop chest pain, have severe difficulty breathing, or feel very tired , stop exercising immediately and seek medical attention. 5)  Take an Aspirin every day. 6)  Check your blood  sugars regularly. If your readings are usually above : or below 70 you should contact our office. 7)  It is important that  your Diabetic A1c level is checked every 3 months. 8)  See your eye doctor yearly to check for diabetic eye damage. 9)  Check your feet each night for sore areas, calluses or signs of infection. 10)  Check your Blood Pressure regularly. If it is above: you should make an appointment. Prescriptions: VICODIN ES 7.5-750 MG  TABS (HYDROCODONE-ACETAMINOPHEN) Take 1 tablet by mouth three times a day  #90 x 3   Entered and Authorized by:   Roderick Pee MD   Signed by:   Roderick Pee MD on 04/05/2010   Method used:   Printed then faxed to ...       [facility]* (retail)       [address]       [place], [place]  [address]       Ph: [phone]       Fax: [phone]   RxID:   [id number] TRIAMCINOLONE ACETONIDE 0.1 % OINT (TRIAMCINOLONE ACETONIDE) apply two times a day  #1 x 3   Entered and Authorized by:   Roderick Pee MD   Signed by:   Roderick Pee MD on 04/05/2010   Method used:   Electronically to        [facility]* (retail)       [address]       [place], [place]  [address]       Ph: [phone]       Fax: [phone]   RxID:   [id number] FUROSEMIDE 20 MG  TABS (FUROSEMIDE) take one tab in the evening  #100 x 3   Entered and Authorized by:   Roderick Pee MD   Signed by:   Roderick Pee MD on 04/05/2010   Method used:   Electronically to        [facility]* (retail)       [address]       [place], [place]  [address]       Ph: [phone]       Fax: [phone]   RxID:   [id number] RHINOCORT AQUA 32 MCG/ACT SUSP (BUDESONIDE (NASAL)) Inhale 2 spray into both nostrils once a day  #3 x 4   Entered and Authorized by:   Roderick Pee MD   Signed by:   Roderick Pee MD on 04/05/2010   Method used:   Electronically to        [facility]* (retail)       [address]       [place], [place]  [address]       Ph: [phone]       Fax: [phone]   RxID:   [id number] LISINOPRIL 40 MG TABS (LISINOPRIL) 1 tablet by mouth twice a day  #200 x 3   Entered and Authorized by:   Roderick Pee MD   Signed by:   Roderick Pee MD on 04/05/2010   Method used:   Electronically to        [facility]* (retail)       [address]       [place], [place]  [address]       Ph: [phone]       Fax: [phone]   RxID:   [id number] IBUPROFEN 800 MG TABS (IBUPROFEN) Take 1 tablet by mouth twice a day  #200 x 3   Entered and Authorized by:   Roderick Pee MD  Signed by:   Roderick Pee MD on 04/05/2010   Method used:   Electronically to        [facility]* (retail)       [address]       [place], [place]  [address]       Ph: [phone]       Fax: [phone]   RxID:   [id number] GABAPENTIN 300 MG CAPS (GABAPENTIN) Take 1 capsule by mouth three times a day  #300 x 3   Entered and Authorized by:   Roderick Pee MD   Signed by:   Roderick Pee MD on 04/05/2010   Method used:   Electronically to        [facility]* (retail)       [address]       [place], [place]  [address]       Ph: [phone]       Fax: [phone]   RxID:   [id number] CLONIDINE HCL 0.1 MG TABS (CLONIDINE HCL) Take 1 tablet by mouth every night  #100 x 3   Entered and Authorized by:   Roderick Pee MD   Signed by:   Roderick Pee MD on 04/05/2010   Method used:   Electronically to        [facility]* (retail)       [address]       [place], [place]  [address]       Ph: [phone]       Fax: [phone]   RxID:   [id number] BUSPIRONE HCL 30 MG TABS (BUSPIRONE HCL) Take 1 tablet by mouth twice a day  #200 x 3   Entered and Authorized by:   Roderick Pee MD   Signed by:   Roderick Pee MD on 04/05/2010   Method used:   Electronically to        [facility]* (retail)        [address]       [place], [place]  [address]       Ph: [phone]       Fax: [phone]   RxID:   [id number] AMITRIPTYLINE HCL 10 MG TABS (AMITRIPTYLINE HCL) 1 tablet by mouth at bedtime  #100 x 3   Entered and Authorized by:   Roderick Pee MD   Signed by:   Roderick Pee MD on 04/05/2010   Method used:   Electronically to        [facility]* (retail)       [address]       [place], [place]  [address]       Ph: [phone]       Fax: [phone]   RxID:   [id number]    Orders Added: 1)  Prescription Created Electronically [G8553] 2)  Est. Patient 40-64 years [99396] 3)  Tobacco use cessation intermediate 3-10 minutes [99406] 4)  EKG w/ Interpretation [93000] 5)  Admin 1st Vaccine [90471] 6)  Flu Vaccine 48yrs + [82505]

## 2010-06-27 NOTE — Assessment & Plan Note (Signed)
Summary: 4 MO ROV /NWS   Vital Signs:  Patient profile:   65 year old male Height:      72 inches (182.88 cm) Weight:      197.25 pounds (89.66 kg) BMI:     26.85 O2 Sat:      97 % on Room air Temp:     98.8 degrees F (37.11 degrees C) oral Pulse rate:   104 / minute BP sitting:   122 / 74  (left arm) Cuff size:   regular  Vitals Entered By: Brenton Grills MA (December 13, 2009 10:08 AM)  O2 Flow:  Room air CC: 4 mo F/U appt/aj Is Patient Diabetic? Yes Comments Pt wants rx refills of Accu-Check Lancets, Buspirone, Gabapentin, Furosemide, BD Insulin Syringes, and Accu-Check Test Strips to be printed out and given to him   Referring Provider:  Dr Mohammed Kindle Primary Provider:  Dr Mohammed Kindle  CC:  4 mo F/U appt/aj.  History of Present Illness: he brings a record of his cbg's which i have reviewed today.  pt states he feels well in general, except for hypoglycemia in the middle of the night, approx 1/week.  he still takes lantus 15 units at bedtime.   Current Medications (verified): 1)  Accu-Chek Multiclix Lancets  Misc (Lancets) .... Use 1 Needle Three Times A Day 2)  Amitriptyline Hcl 10 Mg Tabs (Amitriptyline Hcl) .Marland Kitchen.. 1 Tablet By Mouth At Bedtime 3)  Buspirone Hcl 30 Mg Tabs (Buspirone Hcl) .... Take 1 Tablet By Mouth Twice A Day 4)  Clonidine Hcl 0.1 Mg Tabs (Clonidine Hcl) .... Take 1 Tablet By Mouth Every Night 5)  Gabapentin 300 Mg Caps (Gabapentin) .... Take 1 Capsule By Mouth Three Times A Day 6)  Ibuprofen 800 Mg Tabs (Ibuprofen) .... Take 1 Tablet By Mouth Twice A Day 7)  Lantus 100 Unit/ml Soln (Insulin Glargine) .... Inject 10 Units At Night 8)  Lisinopril 40 Mg Tabs (Lisinopril) .Marland Kitchen.. 1 Tablet By Mouth Twice A Day 9)  Rhinocort Aqua 32 Mcg/act Susp (Budesonide (Nasal)) .... Inhale 2 Spray Into Both Nostrils Once A Day 10)  Adult Aspirin Low Strength 81 Mg  Tbdp (Aspirin) .... One  Mon & Fri 11)  Vicodin Es 7.5-750 Mg  Tabs (Hydrocodone-Acetaminophen) .... Take 1  Tablet By Mouth Three Times A Day 12)  Furosemide 20 Mg  Tabs (Furosemide) .... Take One Tab in The Evening 13)  Zyrtec Allergy 10 Mg  Tabs (Cetirizine Hcl) .... Once Daily 14)  Bd Insulin Syringe Ultrafine 31g X 5/16" 0.5 Ml  Misc (Insulin Syringe-Needle U-100) .... Qid 15)  Accu-Chek Aviva  Strp (Glucose Blood) .... Use Four Times A Day To Check Glucose Levels 16)  Lidex 0.05 % Crea (Fluocinonide) .... Apply At Bedtime 17)  Triamcinolone Acetonide 0.1 % Oint (Triamcinolone Acetonide) .... Apply Two Times A Day 18)  Humalog 100 Unit/ml Soln (Insulin Lispro (Human)) .... Three Times A Day (Just Before Each Meal) 13-13-15 Units 19)  Fluocinonide 0.05 % Crea (Fluocinonide) .... Three Times A Day As Needed For Itching  Allergies (verified): No Known Drug Allergies  Past History:  Past Medical History: Last updated: 10/25/2006 Diabetes mellitus, type I PSORIASIS TA COPD PERIPHERAL NEUROPATHY Hypertension Anxiety  Review of Systems  The patient denies syncope.    Physical Exam  General:  normal appearance.   Skin:  insulin injection sites at anterior abdomen are normal, except for a few ecchymoses Additional Exam:  Hemoglobin A1C       [  H]  8.1 %   Impression & Recommendations:  Problem # 1:  DIABETES MELLITUS, TYPE I (ICD-250.01) needs increased rx  Other Orders: TLB-A1C / Hgb A1C (Glycohemoglobin) (83036-A1C) Est. Patient Level III ([address])  Patient Instructions: 1)  blood tests are being ordered for you today.  please call [phone] to hear your test results. 2)  pending the test results, please: 3)  reduce lantus to 10 units at night only, and: 4)  increase humalog to just before each meal 13-13-15 units. 5)  Please schedule a follow-up appointment in 3 months. 6)  call sooner if sugar is below 70. 7)  check your blood sugar 4 times a day--before the 3 meals, and at bedtime.  also check if you have symptoms of your blood sugar being too high or too low.  please keep a  record of the readings and bring it to your next appointment here.  please call us sooner if you are having low blood sugar episodes. 8)  please consider referral to a diabetes educator to consider a pump and/or continuous glucose monitor.   9)  you are due for a physical with dr todd.  please call for an appointment. 10)  (update: i left message on phone-tree:  rx as we discussed) Prescriptions: ACCU-CHEK AVIVA  STRP (GLUCOSE BLOOD) use four times a day to check glucose levels  #360 x 3   Entered and Authorized by:   Minus Breeding MD   Signed by:   Minus Breeding MD on 12/13/2009   Method used:   Print then Give to Patient   RxID:   [id number] INSULIN SYRINGE ULTRAFINE 31G X 5/16" 0.5 ML  MISC (INSULIN SYRINGE-NEEDLE U-100) qid  #360 x 3   Entered and Authorized by:   Minus Breeding MD   Signed by:   Minus Breeding MD on 12/13/2009   Method used:   Print then Give to Patient   RxID:   [id number] ACCU-CHEK MULTICLIX LANCETS  MISC (LANCETS) Use 1 needle three times a day  #270 x 3   Entered and Authorized by:   Minus Breeding MD   Signed by:   Minus Breeding MD on 12/13/2009   Method used:   Print then Give to Patient   RxID:   [id number] FUROSEMIDE 20 MG  TABS (FUROSEMIDE) take one tab in the evening  #100 x 0   Entered and Authorized by:   Minus Breeding MD   Signed by:   Minus Breeding MD on 12/13/2009   Method used:   Print then Give to Patient   RxID:   [id number] GABAPENTIN 300 MG CAPS (GABAPENTIN) Take 1 capsule by mouth three times a day  #300 x 0   Entered and Authorized by:   Minus Breeding MD   Signed by:   Minus Breeding MD on 12/13/2009   Method used:   Print then Give to Patient   RxID:   [id number] BUSPIRONE HCL 30 MG TABS (BUSPIRONE HCL) Take 1 tablet by mouth twice a day  #200 x 0   Entered and Authorized by:   Minus Breeding MD   Signed by:   Minus Breeding MD on 12/13/2009   Method used:   Print then Give to Patient   RxID:    [id number]

## 2010-06-27 NOTE — Assessment & Plan Note (Signed)
Summary: FU / NWS   Vital Signs:  Patient profile:   65 year old male Height:      72 inches (182.88 cm) Weight:      199 pounds (90.45 kg) BMI:     27.09 O2 Sat:      97 % on Room air Temp:     98.7 degrees F (37.06 degrees C) oral Pulse rate:   86 / minute BP sitting:   120 / 70  (left arm) Cuff size:   regular  Vitals Entered By: Brenton Grills CMA Duncan Dull) (April 18, 2010 10:06 AM)  O2 Flow:  Room air CC: Follow-up visit/rx for Busiprone/aj Is Patient Diabetic? Yes   Referring Provider:  Dr Mohammed Kindle Primary Provider:  Dr Mohammed Kindle  CC:  Follow-up visit/rx for Busiprone/aj.  History of Present Illness: for now, pt wants to defer insulin pump for financial reasons.  he eats 2 meals/day, at 1 pm, and 10 pm.  pt states he feels well in general.  he brings a record of his cbg's which i have reviewed today.  it varies from 200's (am) to low-100's before lunch.  it is variable, though.  he does not check after his evening meal, which is late in the evening.     Current Medications (verified): 1)  Accu-Chek Multiclix Lancets  Misc (Lancets) .... Use 1 Needle Three Times A Day 2)  Amitriptyline Hcl 10 Mg Tabs (Amitriptyline Hcl) .Marland Kitchen.. 1 Tablet By Mouth At Bedtime 3)  Buspirone Hcl 30 Mg Tabs (Buspirone Hcl) .... Take 1 Tablet By Mouth Twice A Day 4)  Clonidine Hcl 0.1 Mg Tabs (Clonidine Hcl) .... Take 1 Tablet By Mouth Every Night 5)  Gabapentin 300 Mg Caps (Gabapentin) .... Take 1 Capsule By Mouth Three Times A Day 6)  Ibuprofen 800 Mg Tabs (Ibuprofen) .... Take 1 Tablet By Mouth Twice A Day 7)  Lantus 100 Unit/ml Soln (Insulin Glargine) .... Inject 10 Units At Night 8)  Lisinopril 40 Mg Tabs (Lisinopril) .Marland Kitchen.. 1 Tablet By Mouth Twice A Day 9)  Rhinocort Aqua 32 Mcg/act Susp (Budesonide (Nasal)) .... Inhale 2 Spray Into Both Nostrils Once A Day 10)  Adult Aspirin Low Strength 81 Mg  Tbdp (Aspirin) .... One  Mon & Fri 11)  Vicodin Es 7.5-750 Mg  Tabs  (Hydrocodone-Acetaminophen) .... Take 1 Tablet By Mouth Three Times A Day 12)  Furosemide 20 Mg  Tabs (Furosemide) .... Take One Tab in The Evening 13)  Zyrtec Allergy 10 Mg  Tabs (Cetirizine Hcl) .... Once Daily 14)  Bd Insulin Syringe Ultrafine 31g X 5/16" 0.5 Ml  Misc (Insulin Syringe-Needle U-100) .... Qid 15)  Accu-Chek Aviva  Strp (Glucose Blood) .... Use Four Times A Day To Check Glucose Levels 16)  Triamcinolone Acetonide 0.1 % Oint (Triamcinolone Acetonide) .... Apply Two Times A Day 17)  Humalog 100 Unit/ml Soln (Insulin Lispro (Human)) .... Three Times A Day (Just Before Each Meal) 13-13-15 Units 18)  Fluocinonide 0.05 % Crea (Fluocinonide) .... Three Times A Day As Needed For Itching  Allergies (verified): No Known Drug Allergies  Past History:  Past Medical History: Last updated: 10/25/2006 Diabetes mellitus, type I PSORIASIS TA COPD PERIPHERAL NEUROPATHY Hypertension Anxiety  Review of Systems  The patient denies hypoglycemia.    Physical Exam  General:  normal appearance.   Pulses:  dorsalis pedis intact bilat.   Extremities:  no deformity.  no ulcer on the feet.  feet are of normal color and temp.  no  edema. there is bilat onychomycosis. Neurologic:  cn 2-12 grossly intact.   readily moves all 4's.   sensation is intact to touch on the feet, but decreased from normal. Additional Exam:  a1c=9.3   Impression & Recommendations:  Problem # 1:  DIABETES MELLITUS, TYPE I (ICD-250.01) needs increased rx  Medications Added to Medication List This Visit: 1)  Lantus 100 Unit/ml Soln (Insulin glargine) .... Inject 12 units at night 2)  Triamcinolone Acetonide 0.1 % Oint (Triamcinolone acetonide) .... Two times a day 1:1 compound with eucerin 3)  Humalog 100 Unit/ml Soln (Insulin lispro (human)) .Marland Kitchen.. 15 units just before each meal.  Other Orders: Est. Patient Level III (16109)  Patient Instructions: 1)  pending knowing what your blood sugar is after your  evening meal, please: 2)  increase lantus to 12 units at night only, and: 3)  take humalog 15 units, just before each meal 4)  Please schedule a follow-up appointment in 3 months. 5)  check your blood sugar 4 times a day--before meals, and at bedtime.  also check if you have symptoms of your blood sugar being too high or too low.  please keep a record of the readings and bring it to your next appointment here.  please call us sooner if you are having low blood sugar episodes.   6)  please call the customer service phone number on your health insurance card, to see what your copay would be for a pump, and a continuous glucose monitor. Prescriptions: BUSPIRONE HCL 30 MG TABS (BUSPIRONE HCL) Take 1 tablet by mouth twice a day  #200 x 3   Entered and Authorized by:   Minus Breeding MD   Signed by:   Minus Breeding MD on 04/18/2010   Method used:   Faxed to ...       [facility] (mail-order)             , [place]         Ph: [phone]       Fax: [phone]   RxID:   [id number] TRIAMCINOLONE ACETONIDE 0.1 % OINT (TRIAMCINOLONE ACETONIDE) two times a day 1:1 compound with eucerin  #1 lb tub x 1   Entered and Authorized by:   Minus Breeding MD   Signed by:   Minus Breeding MD on 04/18/2010   Method used:   Faxed to ...       [facility] (mail-order)             , [place]         Ph: [phone]       Fax: [phone]   RxID:   [id number]    Orders Added: 1)  Est. Patient Level III [32951]

## 2010-06-27 NOTE — Progress Notes (Signed)
Summary: Rx refill req  Phone Note Call from Patient Call back at Home Phone [phone]   Caller: Patient--[phone] Call For: [place] Summary of Call: Pt needs a refill of Insulin, almost out. Set designer.  Initial call taken by: Verdell Face,  March 02, 2010 11:19 AM    Prescriptions: HUMALOG 100 UNIT/ML SOLN (INSULIN LISPRO (HUMAN)) three times a day (just before each meal) 13-13-15 units  #69mth x 1   Entered by:   Margaret Pyle, CMA   Authorized by:   Minus Breeding MD   Signed by:   Margaret Pyle, CMA on 03/02/2010   Method used:   Electronically to        [facility] (retail)             ,          Ph: [phone]       Fax: [phone]   RxID:   [id number]

## 2010-06-27 NOTE — Miscellaneous (Signed)
Summary: [facility], [facility]   Imported By: Lester York 08/26/2009 08:16:23  _____________________________________________________________________  External Attachment:    Type:   Image     Comment:   External Document

## 2010-10-10 NOTE — H&P (Signed)
NAME:  JAMIER, URBAS NO.:  [id number]   MEDICAL RECORD NO.:  [id number]          PATIENT TYPE:  INP   LOCATION:  1310                         FACILITY:  [facility]   PHYSICIAN:  Eugenio Hoes. 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   Patient states he felt well until Friday when he noticed his right leg  had become red and swollen.  He does not recall any history of trauma.  It stayed like this over the weekend.  He also noted Friday his blood  sugar was over 400.  He came to the office today with that complaint and  was immediately admitted to [facility] for pancultures, IV  fluids, IV antibiotics, and control of his marked hyperglycemia.   He has had no previous hospitalizations.  He had a fractured ankle as an  outpatient.   PAST ILLNESSES:  None.   INJURIES:  None.   DRUG ALLERGIES:  None.   He continues to smoke a pipe.  He drinks about 12 ounces of beer daily.   CURRENT MEDICATIONS:  1. Elavil 10 mg q.h.s. for the diabetic neuropathy.  2. BuSpar 30 mg b.i.d. for anxiety.  3. Clonidine 0.1 daily.  4. Lasix 20 at noon, 40 in the morning.  5. Lisinopril 40 b.i.d. for hypertension.  6. Gabapentin 300 mg t.i.d. for neuropathy.  7. Glipizide 10 mg b.i.d.  8. Lantus 10 units b.i.d.  9. Lisinopril as noted above.  10.Rhinocort Aqua nasal spray for allergic rhinitis.  11.Metformin 1000 mg b.i.d.  12.Darvocet-N 100 three times a day as needed for pain.   REVIEW OF  SYSTEMS:  He wears glasses for distance vision, gets yearly  eye exams, otherwise ENT negative.  He does not get regular dental care.  CARDIOPULMONARY:  Negative.  GI:  Negative.  His last colonoscopy was April of 2007 by Dr. Russella Dar  showed a couple colon polyps and hemorrhoids, otherwise negative.  GU:  Negative except for prostatitis x1.  ENDO:  Negative.  Long-term diabetes.  Recently, in the past year and a  half, converted to a type 1, well controlled with Lantus.  His last  hemoglobin A1c was 7.7, March of 2008.  It then dropped down to 7.1 with  improved therapy.   The rest of review of systems negative.   SOCIAL HISTORY:  He works in Airline pilot.  He enjoys playing pool as a hobby.  Married and 2 children.   FAMILY HISTORY:  Dad died in his 52s, unknown cause.  Mother died in her  49s, CNS aneurysm, cancer, unknown primary.  Four brothers, 1 died and  was killed, 1 died of old age, 1 died of unknown cause, the other is in  good health.  Three sisters, 1 died of old age, the other 2 are in good  health.   VACCINATION HISTORY:  Last tetanus, 2003.  Pneumovax, 2008.  Flu shot,  fall of 2008.   PHYSICAL EXAM:  The weight was 185.  Temp 98.  Pulse 70 and regular.  BP  140/70.  GENERAL:  He is a well-developed, well-nourished, white male in no acute  distress.  HEAD, EYES, EARS, NOSE, AND THROAT:  Negative except for stained teeth  from chronic pipe use.  CARDIOPULMONARY:  Negative except for symmetrical decreased breath  sounds.  ABDOMEN:  Negative.  EXTREMITIES:  Normal except for right lower extremity is red, hot,  swollen.  Blood sugar 400, as noted above.   IMPRESSION:  1. Cellulitis, right lower extremity.  2. Diabetes, type 1, out of control.  3. History of hypertension.  4. Neuropathy.  5. Tobacco abuse.  6. Chronic obstructive pulmonary disease.   PLAN:  Admit.  IV fluids.  Cultures.  Start IV Timentin 3.1 g q.h. after  cultures done.  Also, increase Lantus to 20  b.i.d.  If this does not  control his blood sugar, then obviously we will need to go to a sliding  scale.      Jeffrey A. Tawanna Cooler, MD  Electronically Signed     JAT/MEDQ  D:  09/08/2007  T:  09/08/2007  Job:  [id number]

## 2010-10-13 NOTE — Op Note (Signed)
[place]  Patient:    Jeremy Gill, Jeremy Gill                      MRN: [id number] Proc. Date: 08/25/99 Adm. Date:  [id number] Attending:  Ellwood Handler Dictator:   Verl Dicker, M.D. CC:         Evette Georges, M.D. [facility]             Verl Dicker, M.D.                           Operative Report  DATE OF BIRTH:  1945/08/04  REFERRING PHYSICIAN:  Evette Georges, M.D. [facility]  UROLOGIST:  Verl Dicker, M.D.  INDICATIONS:  The patient is a 65 year old male recently diagnosed with diabetes, history of undescended testes and phimosis.  The patient is having progressive ain in the right groin around the right testis, and is having progressive difficulties retracting the foreskin.  CT scan of the abdomen and pelvis on 08/16/99 at [facility] "undescended right testis within the right inguinal canal, no evidence of pelvic or abdominal adenopathy or retroperitoneal mass, PSA 0.3, AFP 1, beta HCG undetectable".  PREOPERATIVE DIAGNOSIS:  Undescended testes, phimosis.  POSTOPERATIVE DIAGNOSIS:  Undescended testes, phimosis.  PROCEDURE:  Right inguinal orchiectomy, circumcision.  ANESTHESIA:  General.  DRAINS:  None.  COMPLICATIONS:  None.  DESCRIPTION OF PROCEDURE:  The patient was prepped and draped in the supine position after institution of an adequate level of general anesthesia.  A transverse incision was made over the right inguinal canal through the skin and  subcutaneous tissue.  Fibers of the external oblique were easily identified. Incision made in the direction of the fibers.  Testes easily identified within he inguinal canal.  Dissection carried along the cord to the level of the internal  inguinal ring.  Cord was then crossed clamped with large Hemostats x 2, and then divided.  Cord was ligated with stick ties of 0 chromic x 2.  The floor of the canal was then closed with interrupted  sutures of 0 Vicryl.  The external oblique was closed with a running suture of 0 Vicryl.  Skin was closed in a subcuticular fashion with 4-0 Dexon.  An incision was then made in the distal aspect of the penile shaft, proximal to the subcoronal sulcus.  A similar incision was made proximal to the original incision, and a ring of fibrotic purpucial skin was removed in a "parallel lines technique". Bleeding sites were lightly fulgurated using needle tip cautery.  Skin edges were reapproximated with interrupted sutures of 3-0 chromic.  The wound was covered ith dry gauze, bacitracin ointment, and Coban tape.  The patient was returned to recovery in satisfactory condition. DD:  08/25/99 TD:  08/25/99 Job: [id number]

## 2010-10-13 NOTE — Discharge Summary (Signed)
NAME:  Jeremy Gill, Jeremy Gill NO.:  [id number]   MEDICAL RECORD NO.:  [id number]          PATIENT TYPE:  INP   LOCATION:  1310                         FACILITY:  [facility]   PHYSICIAN:  Valerie A. Felicity Coyer, MDDATE OF BIRTH:  [birth year]   DATE OF ADMISSION:  09/08/2007  DATE OF DISCHARGE:  09/12/2007                               DISCHARGE SUMMARY   DISCHARGE DIAGNOSES:  1. Right lower extremity cellulitis.  2. Uncontrolled type 2 diabetes.  3. Mild hyponatremia.   HISTORY OF PRESENT ILLNESS:  Jeremy Gill is a 65 year old white male  with a history of diabetes,  anxiety, hypertension and polyneuropathy.  He presented to the office on the day of admission with pain and  swelling of his right lower extremity that started on Friday prior to  admission.  The patient denied any recent trauma to area.  Also upon  evaluation in the ER, the patient found to have a CBG of over 400, at  which time the patient was transferred to the hospital for further  evaluation and treatment.   PAST MEDICAL HISTORY:  1. Diabetes.  2. Anxiety.  3. Hypertension.  4. 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  5. COPD with ongoing tobacco abuse.   COURSE OF HOSPITALIZATION:  The patient admitted to the hospital from  the office and started on IV antibiotics.  Blood cultures obtained at  time of admission, which were negative for any growth.  The patient's  symptoms responded well to antibiotic treatment.  The patient with  decreased white cell count and decreased swelling and erythema to  affected extremity with IV antibiotics.  A venous Doppler obtained,  which was negative for any DVT bilaterally in lower extremities.  The  patient's diabetes exacerbated by infection.  The patient's insulin  doses adjusted accordingly throughout hospitalization.  The patient  easily transitioned from IV antibiotics to p.o. antibiotics, including  Augmentin and doxycycline for MRSA coverage; however, there was   very  low suspicion for MRSA due to decreased white cell count with initial  antibiotic treatment, but, due to the patient's diabetes, double  coverage was ordered.  The patient felt medically stable for discharge  on September 12, 2007, with very close followup with primary care physician.   MEDICATIONS AT THE TIME OF DISCHARGE:  1. Augmentin 875 mg p.o. b.i.d. until gone.  2. Doxycycline 100 mg p.o. b.i.d. until gone.  3. Metformin 1000 mg p.o. b.i.d.  4. Glucotrol XL 10 mg p.o. b.i.d.  5. Lantus insulin 15 units b.i.d.  Note this is a new dose for this      patient.  6. Lisinopril 40 mg p.o. daily.  7. Clonidine 0.1 mg p.o. daily.  8. Neurontin 300 mg p.o. t.i.d.  9. Lasix 40 mg p.o. q.a.m., 20 mg p.o. q.h.s.  10.BuSpar 30 mg p.o. b.i.d.  11.Aspirin 81 mg p.o. daily.  12.Amitriptyline 10 mg p.o. q.h.s.  13.Zyrtec 10 mg p.o. daily.   PERTINENT LABORATORY WORK AT TIME OF DISCHARGE:  White cell count 8.1,  platelet count 311, hemoglobin 9.8, hematocrit 28.6, sodium 133,  potassium 4.0, BUN 14, creatinine  0.89.  As mentioned above, blood  cultures were negative for any growth x2.  Anemia panel done during this  admission revealed iron level of 25 with percent saturation of 11, TIBC  223, vitamin B12 of 283, ferritin 184.  Total cholesterol 135,  triglycerides 51, HDL 47, LDL of 78.   DISPOSITION:  Again, the patient felt medically stable for discharge  home with close outpatient followup.  The patient is scheduled to follow  up with his primary care physician, Dr. Kelle Darting, on April 20 at  10:00 a.m.  The patient also instructed to maintain a low-sodium, low-  sugar diet and to keep right leg elevated when at rest.      Cordelia Pen, NP      Raenette Rover. Felicity Coyer, MD  Electronically Signed    LE/MEDQ  D:  09/24/2007  T:  09/24/2007  Job:  [id number]   cc:   Tinnie Gens A. Tawanna Cooler, MD  [address] [place]  [place] [address]

## 2010-10-30 ENCOUNTER — Telehealth: Payer: Self-pay | Admitting: Family Medicine

## 2010-10-30 NOTE — Telephone Encounter (Signed)
Refills okay for one year or until next complete physical

## 2010-10-30 NOTE — Telephone Encounter (Signed)
Refill Hydrocodone,Amitriptyline for mail order. Pt will pick up. Wants Fleet Contras to call him about a handicap form.

## 2010-11-01 ENCOUNTER — Telehealth: Payer: Self-pay | Admitting: Family Medicine

## 2010-11-01 MED ORDER — HYDROCODONE-ACETAMINOPHEN 7.5-750 MG PO TABS: 1.0000 | ORAL_TABLET | Freq: Three times a day (TID) | ORAL | Status: DC

## 2010-11-01 MED ORDER — AMITRIPTYLINE HCL 10 MG PO TABS: 10.0000 mg | ORAL_TABLET | Freq: Every day | ORAL | Status: DC

## 2010-11-01 NOTE — Telephone Encounter (Signed)
Spoke with patient.

## 2010-11-01 NOTE — Telephone Encounter (Signed)
Pt would like for only Fleet Contras to return his call, regarding the message he left on 10-30-2010 with Aram Beecham. Thanks.

## 2010-11-02 NOTE — Telephone Encounter (Signed)
rx faxed

## 2011-02-20 LAB — CBC
Hemoglobin: 11.9 — ABNORMAL LOW
Hemoglobin: 10.5 — ABNORMAL LOW
MCHC: 34.6
MCHC: 33.9
MCHC: 34.2
Platelets: 246
RBC: 2.96 — ABNORMAL LOW
RDW: 12.9
RDW: 12.9
RDW: 13.1

## 2011-02-20 LAB — LIPID PANEL
Cholesterol: 135
HDL: 47
LDL Cholesterol: 78
Total CHOL/HDL Ratio: 2.9
Triglycerides: 51

## 2011-02-20 LAB — RETICULOCYTES: Retic Count, Absolute: 18.7 — ABNORMAL LOW

## 2011-02-20 LAB — BASIC METABOLIC PANEL
BUN: 15
CO2: 28
CO2: 29
Calcium: 8.5
Calcium: 8.9
Creatinine, Ser: 0.85
Creatinine, Ser: 0.89
GFR calc Af Amer: >60
GFR calc non Af Amer: >60
GFR calc non Af Amer: >60
Glucose, Bld: 57 — ABNORMAL LOW

## 2011-02-20 LAB — COMPREHENSIVE METABOLIC PANEL
AST: 18
Albumin: 3.2 — ABNORMAL LOW
Alkaline Phosphatase: 29 — ABNORMAL LOW
CO2: 27
Chloride: 90 — ABNORMAL LOW
GFR calc non Af Amer: >60
Potassium: 4.7
Total Bilirubin: 0.9

## 2011-02-20 LAB — URINALYSIS, ROUTINE W REFLEX MICROSCOPIC
Bilirubin Urine: NEGATIVE
Leukocytes, UA: NEGATIVE
Nitrite: NEGATIVE
Specific Gravity, Urine: 1.017
Urobilinogen, UA: 1
pH: 6.5

## 2011-02-20 LAB — IRON AND TIBC
Iron: 25 — ABNORMAL LOW
Saturation Ratios: 11 — ABNORMAL LOW
TIBC: 223

## 2011-02-20 LAB — CULTURE, BLOOD (ROUTINE X 2)

## 2011-02-20 LAB — FERRITIN: Ferritin: 184 (ref 22–322)

## 2011-03-09 ENCOUNTER — Other Ambulatory Visit: Payer: Self-pay | Admitting: Family Medicine

## 2011-03-09 MED ORDER — GLUCOSE BLOOD VI STRP: ORAL_STRIP | Status: AC

## 2011-03-09 MED ORDER — ACCU-CHEK MULTICLIX LANCETS MISC: Status: AC

## 2011-03-09 NOTE — Telephone Encounter (Signed)
rx sent in electronically 

## 2011-03-09 NOTE — Telephone Encounter (Signed)
Pt called req refill of Accuchek Aviva Test Strip #120 check glucose 4 times a day. Pt also needs Accu-chek Multiclix Lancets #270 w/ 3 refills. Pls call in to [facility] mail order pharmacy. Pt is almost out of strips and lancets. Pls call in asap today.

## 2011-04-26 ENCOUNTER — Ambulatory Visit (INDEPENDENT_AMBULATORY_CARE_PROVIDER_SITE_OTHER): Payer: 59 | Admitting: *Deleted

## 2011-04-26 DIAGNOSIS — Z23 Encounter for immunization: Secondary | ICD-10-CM

## 2011-04-30 ENCOUNTER — Other Ambulatory Visit: Payer: Self-pay | Admitting: Family Medicine

## 2011-05-02 ENCOUNTER — Other Ambulatory Visit: Payer: Self-pay | Admitting: *Deleted

## 2011-05-02 MED ORDER — "INSULIN SYRINGE-NEEDLE U-100 31G X 5/16"" 0.5 ML MISC": 1.0000 | Freq: Every day | Status: DC

## 2011-05-02 MED ORDER — BUSPIRONE HCL 30 MG PO TABS: 30.0000 mg | ORAL_TABLET | Freq: Two times a day (BID) | ORAL | Status: DC

## 2011-05-02 MED ORDER — GABAPENTIN 300 MG PO CAPS: 300.0000 mg | ORAL_CAPSULE | Freq: Three times a day (TID) | ORAL | Status: DC

## 2011-05-02 MED ORDER — AMITRIPTYLINE HCL 10 MG PO TABS: 10.0000 mg | ORAL_TABLET | Freq: Every day | ORAL | Status: DC

## 2011-05-02 NOTE — Telephone Encounter (Signed)
Patient is requesting a refill of Ibuprofen 800 mg sent to [facility]

## 2011-05-03 NOTE — Telephone Encounter (Signed)
ok 

## 2011-05-04 MED ORDER — IBUPROFEN 800 MG PO TABS: 800.0000 mg | ORAL_TABLET | Freq: Three times a day (TID) | ORAL | Status: DC | PRN

## 2011-05-04 NOTE — Telephone Encounter (Signed)
rx faxed

## 2011-05-11 ENCOUNTER — Other Ambulatory Visit: Payer: Self-pay

## 2011-05-11 MED ORDER — IBUPROFEN 800 MG PO TABS: 800.0000 mg | ORAL_TABLET | Freq: Three times a day (TID) | ORAL | Status: DC | PRN

## 2011-05-15 ENCOUNTER — Other Ambulatory Visit: Payer: Self-pay | Admitting: Family Medicine

## 2011-05-15 NOTE — Telephone Encounter (Signed)
Pt need new rx ibuprofen 800mg  twice a day [facility] [phone]. Fleet Contras please call pt today.

## 2011-05-18 ENCOUNTER — Other Ambulatory Visit: Payer: Self-pay | Admitting: Endocrinology

## 2011-05-21 NOTE — Telephone Encounter (Signed)
[facility]oke with [facility] and prescription was sent 05/16/11

## 2011-07-10 ENCOUNTER — Other Ambulatory Visit (INDEPENDENT_AMBULATORY_CARE_PROVIDER_SITE_OTHER): Payer: 59

## 2011-07-10 ENCOUNTER — Encounter: Payer: Self-pay | Admitting: Endocrinology

## 2011-07-10 ENCOUNTER — Ambulatory Visit (INDEPENDENT_AMBULATORY_CARE_PROVIDER_SITE_OTHER): Payer: 59 | Admitting: Endocrinology

## 2011-07-10 VITALS — BP 138/72 | HR 88 | Temp 97.1°F | Ht 71.0 in | Wt 189.0 lb

## 2011-07-10 DIAGNOSIS — E109 Type 1 diabetes mellitus without complications: Secondary | ICD-10-CM

## 2011-07-10 MED ORDER — INSULIN GLARGINE 100 UNIT/ML ~~LOC~~ SOLN: 12.0000 [IU] | Freq: Every day | SUBCUTANEOUS | Status: DC

## 2011-07-10 MED ORDER — INSULIN LISPRO 100 UNIT/ML ~~LOC~~ SOLN: SUBCUTANEOUS | Status: DC

## 2011-07-10 NOTE — Patient Instructions (Addendum)
increase lantus to 12 units at night only, and: take humalog 12 units, just before each meal. Please schedule a follow-up appointment in 1 month. check your blood sugar 4 times a day--before meals, and at bedtime.  also check if you have symptoms of your blood sugar being too high or too low.  please keep a record of the readings and bring it to your next appointment here.  please call us sooner if you are having low blood sugar episodes. good diet and exercise habits significanly improve the control of your diabetes.  please let me know if you wish to be referred to a dietician.  high blood sugar is very risky to your health.  you should see an eye doctor every year. controlling your blood pressure and cholesterol drastically reduces the damage diabetes does to your body.  this also applies to quitting smoking.  please discuss these with your doctor.  you should take an aspirin every day, unless you have been advised by a doctor not to. blood tests are being requested for you today.  please call [phone] to hear your test results.  You will be prompted to enter the 9-digit "MRN" number that appears at the top left of this page, followed by #.  Then you will hear the message.

## 2011-07-10 NOTE — Progress Notes (Signed)
  Subjective:    Patient ID: Jeremy Gill, male    DOB: 06-28-1945, 66 y.o.   MRN: [id number]  HPI Pt returns for f/u of adult-onset type 1 DM (2000).  pt states he feels well in general.  He seldom takes humalog, and he takes a varying dosage of lantus.  he brings a record of his cbg's which i have reviewed today.  It varies from 70-300.  It is in general higher as the day goes on.  Past Medical History  Diagnosis Date  . DIABETES MELLITUS, TYPE I 10/25/2006  . ANXIETY 10/25/2006  . COPD 10/25/2006  . HYPERTENSION 10/25/2006  . 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 Polyneuropathy in diabetes 04/08/2007  . VENOUS INSUFFICIENCY 11/25/2007  . TOBACCO ABUSE 07/08/2007  . Psoriasis     Past Surgical History  Procedure Date  . Testicle removal     R testicle    History   Social History  . Marital Status: Married    Spouse Name: N/A    Number of Children: N/A  . Years of Education: N/A   Occupational History  . Self Employed    Social History Main Topics  . Smoking status: Former Smoker    Quit date: 05/29/2011  . Smokeless tobacco: Not on file  . Alcohol Use: No  . Drug Use: No  . Sexually Active: Not on file   Other Topics Concern  . Not on file   Social History Narrative   Regular exercise-no    Current Outpatient Prescriptions on File Prior to Visit  Medication Sig Dispense Refill  . amitriptyline (ELAVIL) 10 MG tablet Take 1 tablet (10 mg total) by mouth at bedtime.  90 tablet  3  . busPIRone (BUSPAR) 30 MG tablet Take 1 tablet (30 mg total) by mouth 2 (two) times daily.  180 tablet  3  . gabapentin (NEURONTIN) 300 MG capsule Take 1 capsule (300 mg total) by mouth 3 (three) times daily.  270 capsule  3  . glucose blood (ACCU-CHEK AVIVA) test strip Use as directed  270 each  3  . Insulin Syringe-Needle U-100 (B-D INS SYRINGE 0.5CC/31GX5/16) 31G X 5/16" 0.5 ML MISC 1 each by Does not apply route daily.  100 each  3  . Lancets (ACCU-CHEK MULTICLIX) lancets Use as directed  300  each  3    No Known Allergies  Family History  Problem Relation Age of Onset  . Cancer Mother     Unknown   . Diabetes Sister     BP 138/72  Pulse 88  Temp(Src) 97.1 F (36.2 C) (Oral)  Ht 5\' 11"  (1.803 m)  Wt 189 lb (85.73 kg)  BMI 26.36 kg/m2  SpO2 98%   Review of Systems Denies loc    Objective:   Physical Exam VITAL SIGNS:  See vs page GENERAL: no distress Pulses: dorsalis pedis intact bilat.   Feet: no deformity.  no ulcer on the feet.  feet are of normal color and temp.  no edema. Neuro: sensation is intact to touch on the feet, but decreased from normal.    Lab Results  Component Value Date   HGBA1C 10.2* 07/10/2011      Assessment & Plan:  DM, needs increased rx

## 2011-07-17 ENCOUNTER — Telehealth: Payer: Self-pay | Admitting: Family Medicine

## 2011-07-17 NOTE — Telephone Encounter (Signed)
Patient called stating that he called the pharmacy to get a refill on his oxycodone and was told that his doctors office needed to call in narcotics. Patient needs a refill on his oxycodone 7.5/750v/35 also his ibuprofen 800mg . Please assist and inform patient when done.

## 2011-07-18 MED ORDER — HYDROCODONE-ACETAMINOPHEN 7.5-750 MG PO TABS: 1.0000 | ORAL_TABLET | Freq: Three times a day (TID) | ORAL | Status: DC | PRN

## 2011-07-18 MED ORDER — IBUPROFEN 800 MG PO TABS: 800.0000 mg | ORAL_TABLET | Freq: Two times a day (BID) | ORAL | Status: DC

## 2011-07-18 NOTE — Telephone Encounter (Signed)
Fleet Contras please refill his Vicodin ES #90 one half to one tablet 3 times daily when necessary for pain no refills he needs a 30 minute appointment sometime in the next month for general medical exam since we've not seen him in over a year

## 2011-08-07 ENCOUNTER — Ambulatory Visit (INDEPENDENT_AMBULATORY_CARE_PROVIDER_SITE_OTHER): Payer: 59 | Admitting: Family Medicine

## 2011-08-07 ENCOUNTER — Encounter: Payer: Self-pay | Admitting: Family Medicine

## 2011-08-07 DIAGNOSIS — E1142 Type 2 diabetes mellitus with diabetic polyneuropathy: Secondary | ICD-10-CM

## 2011-08-07 DIAGNOSIS — K143 Hypertrophy of tongue papillae: Secondary | ICD-10-CM

## 2011-08-07 DIAGNOSIS — M549 Dorsalgia, unspecified: Secondary | ICD-10-CM

## 2011-08-07 DIAGNOSIS — L301 Dyshidrosis [pompholyx]: Secondary | ICD-10-CM

## 2011-08-07 DIAGNOSIS — I10 Essential (primary) hypertension: Secondary | ICD-10-CM

## 2011-08-07 DIAGNOSIS — I872 Venous insufficiency (chronic) (peripheral): Secondary | ICD-10-CM

## 2011-08-07 DIAGNOSIS — N138 Other obstructive and reflux uropathy: Secondary | ICD-10-CM

## 2011-08-07 DIAGNOSIS — J449 Chronic obstructive pulmonary disease, unspecified: Secondary | ICD-10-CM

## 2011-08-07 DIAGNOSIS — N401 Enlarged prostate with lower urinary tract symptoms: Secondary | ICD-10-CM

## 2011-08-07 DIAGNOSIS — M1990 Unspecified osteoarthritis, unspecified site: Secondary | ICD-10-CM

## 2011-08-07 DIAGNOSIS — Z0000 Encounter for general adult medical examination without abnormal findings: Secondary | ICD-10-CM

## 2011-08-07 DIAGNOSIS — E109 Type 1 diabetes mellitus without complications: Secondary | ICD-10-CM

## 2011-08-07 DIAGNOSIS — F17200 Nicotine dependence, unspecified, uncomplicated: Secondary | ICD-10-CM

## 2011-08-07 DIAGNOSIS — Z23 Encounter for immunization: Secondary | ICD-10-CM

## 2011-08-07 LAB — HEPATIC FUNCTION PANEL
ALT: 19 U/L (ref 0–53)
AST: 16 U/L (ref 0–37)
Alkaline Phosphatase: 25 U/L — ABNORMAL LOW (ref 39–117)
Bilirubin, Direct: 0.1 mg/dL (ref 0.0–0.3)
Total Protein: 6.9 g/dL (ref 6.0–8.3)

## 2011-08-07 LAB — LIPID PANEL
Cholesterol: 196 mg/dL (ref 0–200)
VLDL: 8.2 mg/dL (ref 0.0–40.0)

## 2011-08-07 LAB — CBC WITH DIFFERENTIAL/PLATELET
Basophils Absolute: 0 10*3/uL (ref 0.0–0.1)
Eosinophils Absolute: 0.1 10*3/uL (ref 0.0–0.7)
Lymphs Abs: 2.8 10*3/uL (ref 0.7–4.0)
MCHC: 33.4 g/dL (ref 30.0–36.0)
MCV: 93.9 fl (ref 78.0–100.0)
Monocytes Absolute: 0.5 10*3/uL (ref 0.1–1.0)
Neutrophils Relative %: 47.2 % (ref 43.0–77.0)
Platelets: 193 10*3/uL (ref 150.0–400.0)
RDW: 13.6 % (ref 11.5–14.6)
WBC: 6.5 10*3/uL (ref 4.5–10.5)

## 2011-08-07 LAB — BASIC METABOLIC PANEL
BUN: 20 mg/dL (ref 6–23)
Creatinine, Ser: 1 mg/dL (ref 0.4–1.5)
GFR: 80.41 mL/min (ref >60.00)
Glucose, Bld: 265 mg/dL — ABNORMAL HIGH (ref 70–99)

## 2011-08-07 LAB — HEMOGLOBIN A1C: Hgb A1c MFr Bld: 9.7 % — ABNORMAL HIGH (ref 4.6–6.5)

## 2011-08-07 LAB — POCT URINALYSIS DIPSTICK
Leukocytes, UA: NEGATIVE
Protein, UA: NEGATIVE
Urobilinogen, UA: 0.2
pH, UA: 6

## 2011-08-07 LAB — MICROALBUMIN / CREATININE URINE RATIO: Microalb, Ur: 0.9 mg/dL (ref 0.0–1.9)

## 2011-08-07 MED ORDER — HYDROCODONE-ACETAMINOPHEN 7.5-750 MG PO TABS: 1.0000 | ORAL_TABLET | Freq: Three times a day (TID) | ORAL | Status: DC

## 2011-08-07 MED ORDER — IBUPROFEN 400 MG PO TABS: ORAL_TABLET | ORAL | Status: DC

## 2011-08-07 NOTE — Progress Notes (Signed)
Subjective:    Patient ID: Jeremy Gill, male    DOB: [birth year], 66 y.o.   MRN: [id number]  HPI Jeremy Gill is a 66 year old single male,,,, X. smoker times 06/12/2011,,,,,,,, who comes in today for a Medicare wellness examination because of a history of diabetes type 1, hypertension, osteoarthritis, neuropathy, venous insufficiency, anxiety, sleep dysfunction  His medications were reviewed in detail there've been no changes the only recommendation I would have would be to decrease his Motrin to no more than 400 twice a day. He's currently taking 800 mg 3 times a day which increases his chance to GI bleed and renal failure also the NSAIDs can cause elevated blood pressure  He takes Vicodin 1 tablet 3 times daily for back pain.  His blood pressure today was 170/98. He states his blood pressures at home are Gill normal 130/80  He is followed by Dr. Everardo Gill endocrinology for his diabetes  He gets routine eye care, poor dentition no dental care, hearing normal, although I think it Jeremy Gill managed, cognitive function normal home health safety reviewed no issues identified, he does not exercise on a regular basis, no guns in the house, he does have a health care power of attorney and living well tetanus 2003 Pneumovax x2 seasonal flu shot 2012 information given on shingles   Review of Systems  Constitutional: Negative.   HENT: Negative.   Eyes: Negative.   Respiratory: Negative.   Cardiovascular: Negative.   Gastrointestinal: Negative.   Genitourinary: Negative.   Musculoskeletal: Negative.   Skin: Negative.   Neurological: Negative.   Hematological: Negative.   Psychiatric/Behavioral: Negative.        Objective:   Physical Exam  Constitutional: He is oriented to person, place, and time. He appears well-developed and well-nourished.  HENT:  Head: Normocephalic and atraumatic.  Right Ear: External ear normal.  Left Ear: External ear normal.  Nose: Nose normal.  Mouth/Throat: Oropharynx  is clear and moist.       Extremely poor dentition referred to Dr. dentist for complete evaluation  Eyes: Conjunctivae and EOM are normal. Pupils are equal, round, and reactive to light.  Neck: Normal range of motion. Neck supple. No JVD present. No tracheal deviation present. No thyromegaly present.  Cardiovascular: Normal rate, regular rhythm, normal heart sounds and intact distal pulses.  Exam reveals no gallop and no friction rub.   No murmur heard. Pulmonary/Chest: Effort normal and breath sounds normal. No stridor. No respiratory distress. He has no wheezes. He has no rales. He exhibits no tenderness.  Abdominal: Soft. Bowel sounds are normal. He exhibits no distension and no mass. There is no tenderness. There is no rebound and no guarding.  Genitourinary: Rectum normal, prostate normal and penis normal. Guaiac negative stool. No penile tenderness.  Musculoskeletal: Normal range of motion. He exhibits no edema and no tenderness.  Lymphadenopathy:    He has no cervical adenopathy.  Neurological: He is alert and oriented to person, place, and time. He has normal reflexes. No cranial nerve deficit. He exhibits normal muscle tone.  Skin: Skin is warm and dry. No rash noted. No erythema. No pallor.  Psychiatric: He has a normal mood and affect. His behavior is normal. Judgment and thought content normal.          Assessment & Plan:  Diabetes type 1 managed by Dr. Everardo Gill  Chronic back pain and osteoarthritis continue Motrin but decrease dose to 400 twice a day Vicodin when necessary  Hypertension continue current medication  Sleep dysfunction  anxiety continue current medication  BP check daily followup in one month

## 2011-08-07 NOTE — Patient Instructions (Signed)
Is decrease the Motrin to 400 mg twice daily with food  Check your blood pressure daily in the morning return in 4 weeks for followup

## 2011-08-21 ENCOUNTER — Ambulatory Visit: Payer: 59 | Admitting: Endocrinology

## 2011-09-18 ENCOUNTER — Ambulatory Visit: Payer: 59 | Admitting: Endocrinology

## 2011-10-03 ENCOUNTER — Other Ambulatory Visit: Payer: Self-pay | Admitting: *Deleted

## 2011-10-03 MED ORDER — INSULIN LISPRO 100 UNIT/ML ~~LOC~~ SOLN: SUBCUTANEOUS | Status: DC

## 2011-12-06 ENCOUNTER — Telehealth: Payer: Self-pay | Admitting: Family Medicine

## 2011-12-06 NOTE — Telephone Encounter (Signed)
Pt called and said that he would like to get a call back asap. Would not give any detail to what it was regarding.

## 2011-12-06 NOTE — Telephone Encounter (Signed)
Pt was calling in regard to son

## 2011-12-21 ENCOUNTER — Other Ambulatory Visit: Payer: Self-pay | Admitting: Endocrinology

## 2011-12-31 ENCOUNTER — Other Ambulatory Visit: Payer: Self-pay | Admitting: *Deleted

## 2011-12-31 MED ORDER — CLONIDINE HCL 0.1 MG PO TABS: 0.1000 mg | ORAL_TABLET | Freq: Every evening | ORAL | Status: DC

## 2011-12-31 MED ORDER — FUROSEMIDE 20 MG PO TABS: 20.0000 mg | ORAL_TABLET | Freq: Every evening | ORAL | Status: DC

## 2012-01-01 ENCOUNTER — Other Ambulatory Visit: Payer: Self-pay | Admitting: *Deleted

## 2012-01-01 MED ORDER — LISINOPRIL 40 MG PO TABS: 40.0000 mg | ORAL_TABLET | Freq: Every day | ORAL | Status: DC

## 2012-01-08 ENCOUNTER — Telehealth: Payer: Self-pay | Admitting: Speech Pathology

## 2012-01-08 DIAGNOSIS — M549 Dorsalgia, unspecified: Secondary | ICD-10-CM

## 2012-01-08 NOTE — Telephone Encounter (Signed)
Referral request sent 

## 2012-01-08 NOTE — Telephone Encounter (Signed)
Pt called and said Dr. Tawanna Cooler referred him to see Dr. Dareen Piano, Rheumatologist, however they are requesting the referral come from [place].  Please send me a referral for this.  Thanks.

## 2012-02-06 ENCOUNTER — Other Ambulatory Visit: Payer: Self-pay | Admitting: Family Medicine

## 2012-02-19 ENCOUNTER — Other Ambulatory Visit: Payer: Self-pay | Admitting: *Deleted

## 2012-02-19 DIAGNOSIS — E1142 Type 2 diabetes mellitus with diabetic polyneuropathy: Secondary | ICD-10-CM

## 2012-02-19 MED ORDER — HYDROCODONE-ACETAMINOPHEN 7.5-750 MG PO TABS: 1.0000 | ORAL_TABLET | Freq: Three times a day (TID) | ORAL | Status: DC

## 2012-03-12 ENCOUNTER — Ambulatory Visit (INDEPENDENT_AMBULATORY_CARE_PROVIDER_SITE_OTHER): Payer: 59

## 2012-03-12 DIAGNOSIS — Z23 Encounter for immunization: Secondary | ICD-10-CM

## 2012-07-17 ENCOUNTER — Other Ambulatory Visit: Payer: Self-pay | Admitting: Endocrinology

## 2012-07-18 ENCOUNTER — Other Ambulatory Visit: Payer: Self-pay | Admitting: *Deleted

## 2012-07-18 ENCOUNTER — Other Ambulatory Visit: Payer: Self-pay | Admitting: Family Medicine

## 2012-07-18 MED ORDER — INSULIN LISPRO 100 UNIT/ML ~~LOC~~ SOLN: SUBCUTANEOUS | Status: DC

## 2012-07-23 ENCOUNTER — Other Ambulatory Visit: Payer: Self-pay | Admitting: Endocrinology

## 2012-07-23 MED ORDER — INSULIN LISPRO 100 UNIT/ML ~~LOC~~ SOLN: SUBCUTANEOUS | Status: DC

## 2012-08-06 ENCOUNTER — Other Ambulatory Visit: Payer: Self-pay | Admitting: Family Medicine

## 2012-08-07 ENCOUNTER — Encounter: Payer: Self-pay | Admitting: Family Medicine

## 2012-08-29 ENCOUNTER — Telehealth: Payer: Self-pay | Admitting: Family Medicine

## 2012-08-29 MED ORDER — ONETOUCH ULTRASOFT LANCETS MISC: Status: DC

## 2012-08-29 MED ORDER — GLUCOSE BLOOD VI STRP: ORAL_STRIP | Status: DC

## 2012-08-29 NOTE — Telephone Encounter (Signed)
[facility]

## 2012-08-29 NOTE — Telephone Encounter (Signed)
Pt has new meter one touch verio IQ [facility] lancets,strip test enough for 3 month supply. Pt is testing blood sugar 4 times a day

## 2012-10-22 ENCOUNTER — Telehealth: Payer: Self-pay | Admitting: Family Medicine

## 2012-10-22 NOTE — Telephone Encounter (Signed)
Patient called stating that he need a refill of his hydrocodone 7.5-750mg  1po tid [facility] for a 90 day supply. Please assist.

## 2012-10-22 NOTE — Telephone Encounter (Signed)
Patient also needs his ibuprofen 400 mg 1po tid [facility] for a 90 day supply along with previous request.

## 2012-10-24 MED ORDER — IBUPROFEN 800 MG PO TABS: 800.0000 mg | ORAL_TABLET | Freq: Three times a day (TID) | ORAL | Status: DC | PRN

## 2012-10-24 MED ORDER — HYDROCODONE-ACETAMINOPHEN 7.5-325 MG PO TABS: 1.0000 | ORAL_TABLET | Freq: Three times a day (TID) | ORAL | Status: DC | PRN

## 2012-10-27 NOTE — Telephone Encounter (Signed)
okay

## 2012-12-02 ENCOUNTER — Other Ambulatory Visit (INDEPENDENT_AMBULATORY_CARE_PROVIDER_SITE_OTHER): Payer: 59

## 2012-12-02 DIAGNOSIS — Z0000 Encounter for general adult medical examination without abnormal findings: Secondary | ICD-10-CM

## 2012-12-02 LAB — CBC WITH DIFFERENTIAL/PLATELET
Basophils Absolute: 0.1 10*3/uL (ref 0.0–0.1)
Eosinophils Absolute: 0.3 10*3/uL (ref 0.0–0.7)
Hemoglobin: 12.4 g/dL — ABNORMAL LOW (ref 13.0–17.0)
Lymphocytes Relative: 41.6 % (ref 12.0–46.0)
MCHC: 33.7 g/dL (ref 30.0–36.0)
Monocytes Relative: 8.7 % (ref 3.0–12.0)
Neutro Abs: 2.5 10*3/uL (ref 1.4–7.7)
Platelets: 199 10*3/uL (ref 150.0–400.0)
RDW: 13.6 % (ref 11.5–14.6)

## 2012-12-02 LAB — HEPATIC FUNCTION PANEL
Albumin: 3.9 g/dL (ref 3.5–5.2)
Alkaline Phosphatase: 23 U/L — ABNORMAL LOW (ref 39–117)
Total Bilirubin: 0.4 mg/dL (ref 0.3–1.2)

## 2012-12-02 LAB — LIPID PANEL
LDL Cholesterol: 110 mg/dL — ABNORMAL HIGH (ref 0–99)
VLDL: 7.6 mg/dL (ref 0.0–40.0)

## 2012-12-02 LAB — POCT URINALYSIS DIPSTICK
Bilirubin, UA: NEGATIVE
Leukocytes, UA: NEGATIVE
Nitrite, UA: NEGATIVE
Protein, UA: NEGATIVE
pH, UA: 7

## 2012-12-02 LAB — MICROALBUMIN / CREATININE URINE RATIO: Microalb, Ur: 0.5 mg/dL (ref 0.0–1.9)

## 2012-12-02 LAB — BASIC METABOLIC PANEL
BUN: 20 mg/dL (ref 6–23)
CO2: 26 mEq/L (ref 19–32)
Calcium: 9.2 mg/dL (ref 8.4–10.5)
Creatinine, Ser: 1 mg/dL (ref 0.4–1.5)
GFR: 79.16 mL/min (ref >60.00)
Glucose, Bld: 233 mg/dL — ABNORMAL HIGH (ref 70–99)
Sodium: 140 mEq/L (ref 135–145)

## 2012-12-02 LAB — PSA: PSA: 0.48 ng/mL (ref 0.10–4.00)

## 2012-12-02 LAB — HEMOGLOBIN A1C: Hgb A1c MFr Bld: 9.6 % — ABNORMAL HIGH (ref 4.6–6.5)

## 2012-12-09 ENCOUNTER — Encounter: Payer: Self-pay | Admitting: Family Medicine

## 2012-12-09 ENCOUNTER — Ambulatory Visit (INDEPENDENT_AMBULATORY_CARE_PROVIDER_SITE_OTHER): Payer: 59 | Admitting: Family Medicine

## 2012-12-09 VITALS — BP 120/80 | Temp 98.9°F | Ht 71.0 in | Wt 193.0 lb

## 2012-12-09 DIAGNOSIS — E1142 Type 2 diabetes mellitus with diabetic polyneuropathy: Secondary | ICD-10-CM

## 2012-12-09 DIAGNOSIS — E109 Type 1 diabetes mellitus without complications: Secondary | ICD-10-CM

## 2012-12-09 DIAGNOSIS — L301 Dyshidrosis [pompholyx]: Secondary | ICD-10-CM

## 2012-12-09 DIAGNOSIS — J4489 Other specified chronic obstructive pulmonary disease: Secondary | ICD-10-CM

## 2012-12-09 DIAGNOSIS — F17200 Nicotine dependence, unspecified, uncomplicated: Secondary | ICD-10-CM

## 2012-12-09 DIAGNOSIS — I10 Essential (primary) hypertension: Secondary | ICD-10-CM

## 2012-12-09 DIAGNOSIS — I872 Venous insufficiency (chronic) (peripheral): Secondary | ICD-10-CM

## 2012-12-09 DIAGNOSIS — K143 Hypertrophy of tongue papillae: Secondary | ICD-10-CM

## 2012-12-09 DIAGNOSIS — J449 Chronic obstructive pulmonary disease, unspecified: Secondary | ICD-10-CM

## 2012-12-09 DIAGNOSIS — G8929 Other chronic pain: Secondary | ICD-10-CM

## 2012-12-09 DIAGNOSIS — M549 Dorsalgia, unspecified: Secondary | ICD-10-CM

## 2012-12-09 MED ORDER — LISINOPRIL 40 MG PO TABS: 40.0000 mg | ORAL_TABLET | Freq: Every day | ORAL | Status: DC

## 2012-12-09 MED ORDER — GABAPENTIN 300 MG PO CAPS: ORAL_CAPSULE | ORAL | Status: DC

## 2012-12-09 MED ORDER — BUSPIRONE HCL 30 MG PO TABS: ORAL_TABLET | ORAL | Status: DC

## 2012-12-09 MED ORDER — CLONIDINE HCL 0.1 MG PO TABS: 0.1000 mg | ORAL_TABLET | Freq: Every evening | ORAL | Status: DC

## 2012-12-09 MED ORDER — INSULIN LISPRO 100 UNIT/ML ~~LOC~~ SOLN: SUBCUTANEOUS | Status: DC

## 2012-12-09 MED ORDER — HYDROCODONE-ACETAMINOPHEN 7.5-325 MG PO TABS: 1.0000 | ORAL_TABLET | Freq: Three times a day (TID) | ORAL | Status: DC | PRN

## 2012-12-09 MED ORDER — FUROSEMIDE 20 MG PO TABS: 20.0000 mg | ORAL_TABLET | Freq: Every evening | ORAL | Status: DC

## 2012-12-09 MED ORDER — INSULIN GLARGINE 100 UNIT/ML ~~LOC~~ SOLN: SUBCUTANEOUS | Status: DC

## 2012-12-09 MED ORDER — AMITRIPTYLINE HCL 10 MG PO TABS: ORAL_TABLET | ORAL | Status: DC

## 2012-12-09 NOTE — Patient Instructions (Signed)
Increase her regular insulin to 16 units before each meal and increase your evening dose of insulin Lantus to 18 units  Check a fasting blood sugar once a day in the morning  Return in 4 weeks for followup  Continue your other medications as outlined  I would go see a dentist a Dr. Mellody Dance we may not be what gets her blood sugar under good control because her teeth are all and gums are all inflamed........ I would recommend Dr. Alvester Morin

## 2012-12-09 NOTE — Progress Notes (Signed)
Subjective:    Patient ID: Jeremy Gill, male    DOB: [birth year], 67 y.o.   MRN: [id number]  HPI Court is a 67 year old male still smokes but down to one cigarette a day who comes in today for his annual Medicare wellness examination because of a history of diabetes type 1, hypertension, chronic back and neck pain, diabetic neuropathy, ongoing tobacco abuse  He is on 12 units of Lantus at bedtime and 12 units of Humalog before each meal. His blood sugar was 233 fasting the other day and his A1c is 9.6%.  He has found it difficult to follow a diet and exercise because of his chronic pain.  He gets routine eye care, does not get any dental care, had a colonoscopy at one time was normal, vaccinations up-to-date except I recommended shingles vaccine for him  Cognitive function normal he does not exercise on a regular basis home no sick reviewed no issues identified, no guns in the house, he does have a health care power of attorney and living well  For chronic pain he takes the Neurontin 300 mg 3 times daily, Motrin 800 mg 3 times daily and Vicodin 7.5 mg 3 times daily  We talked about the negative impact of over-the-counter NSAIDs and his diabetes and he is also on lisinopril. Renal function fairly normal with a GFR of 79 creatinine 1.0. He understands this combination may cause him to have renal failure   Review of Systems  Constitutional: Negative.   HENT: Negative.   Eyes: Negative.   Respiratory: Negative.   Cardiovascular: Negative.   Gastrointestinal: Negative.   Genitourinary: Negative.   Musculoskeletal: Negative.   Skin: Negative.   Neurological: Negative.   Psychiatric/Behavioral: Negative.        Objective:   Physical Exam  Nursing note and vitals reviewed. Constitutional: He is oriented to person, place, and time. He appears well-developed and well-nourished.  HENT:  Head: Normocephalic and atraumatic.  Right Ear: External ear normal.  Left Ear: External ear  normal.  Nose: Nose normal.  Mouth/Throat: Oropharynx is clear and moist.  Poor dentition teeth are stained guns are inflamed  Eyes: Conjunctivae and EOM are normal. Pupils are equal, round, and reactive to light.  Neck: Normal range of motion. Neck supple. No JVD present. No tracheal deviation present. No thyromegaly present.  Cardiovascular: Normal rate, regular rhythm, normal heart sounds and intact distal pulses.  Exam reveals no gallop and no friction rub.   No murmur heard. No carotid artery bruits peripheral pulses 1+ out of 2 and symmetrical  Pulmonary/Chest: Effort normal and breath sounds normal. No stridor. No respiratory distress. He has no wheezes. He has no rales. He exhibits no tenderness.  Abdominal: Soft. Bowel sounds are normal. He exhibits no distension and no mass. There is no tenderness. There is no rebound and no guarding.  Genitourinary: Rectum normal and penis normal. Guaiac negative stool. No penile tenderness.  2+ symmetrical BPH  Musculoskeletal: Normal range of motion. He exhibits no edema and no tenderness.  Lymphadenopathy:    He has no cervical adenopathy.  Neurological: He is alert and oriented to person, place, and time. He has normal reflexes. No cranial nerve deficit. He exhibits normal muscle tone.  Skin: Skin is warm and dry. No rash noted. No erythema. No pallor.  Psychiatric: He has a normal mood and affect. His behavior is normal. Judgment and thought content normal.          Assessment & Plan:  Diabetes  type 1 not a good control will increase regular insulin to 14 units before each meal and Lantus we'll increase to 16 units at bedtime followup in one month  Diabetic neuropathy continue Elavil 10 mg  Anxiety continue BuSpar 30 mg twice a day  Hypertension continue Catapres and lisinopril BP good today 120/80  Chronic pain continue Neurontin 300 mg twice a day Motrin 800 3 times a day and Vicodin 7.5 mg 3 times a day  Tobacco abuse again  discussed smoking cessation he says he is down to one cigarette a day  Underlying COPD from chronic tobacco abuse currently asymptomatic  Poor dentition recommend dental consult ASAP

## 2012-12-12 ENCOUNTER — Ambulatory Visit (INDEPENDENT_AMBULATORY_CARE_PROVIDER_SITE_OTHER): Payer: 59 | Admitting: *Deleted

## 2012-12-12 DIAGNOSIS — Z2911 Encounter for prophylactic immunotherapy for respiratory syncytial virus (RSV): Secondary | ICD-10-CM

## 2012-12-12 DIAGNOSIS — Z23 Encounter for immunization: Secondary | ICD-10-CM

## 2012-12-22 ENCOUNTER — Encounter: Payer: Self-pay | Admitting: Family Medicine

## 2012-12-29 ENCOUNTER — Telehealth: Payer: Self-pay | Admitting: Family Medicine

## 2012-12-29 DIAGNOSIS — G8929 Other chronic pain: Secondary | ICD-10-CM

## 2012-12-29 DIAGNOSIS — M549 Dorsalgia, unspecified: Secondary | ICD-10-CM

## 2012-12-29 NOTE — Telephone Encounter (Signed)
PT is calling to request a 3 month refill of his HYDROcodone-acetaminophen (NORCO) 7.5-325 MG per tablet, and ibuprofen (ADVIL,MOTRIN) 800 MG tablet. He states that when the Ibuprofen was filled in may, it was only filled for 90 and it should've been filled for 180 quantity. [facility]. Please assist.

## 2013-01-01 MED ORDER — HYDROCODONE-ACETAMINOPHEN 7.5-325 MG PO TABS: 1.0000 | ORAL_TABLET | Freq: Three times a day (TID) | ORAL | Status: DC | PRN

## 2013-01-01 MED ORDER — IBUPROFEN 800 MG PO TABS
800.0000 mg | ORAL_TABLET | Freq: Three times a day (TID) | ORAL | Status: DC
Start: 2013-01-01 — End: 2013-03-30

## 2013-01-01 NOTE — Telephone Encounter (Signed)
Hydrocodone faxed and Ibuprofen sent to [facility]

## 2013-03-24 ENCOUNTER — Ambulatory Visit: Payer: 59

## 2013-03-24 DIAGNOSIS — Z23 Encounter for immunization: Secondary | ICD-10-CM

## 2013-03-30 ENCOUNTER — Other Ambulatory Visit: Payer: Self-pay | Admitting: *Deleted

## 2013-03-30 MED ORDER — IBUPROFEN 800 MG PO TABS: 800.0000 mg | ORAL_TABLET | Freq: Three times a day (TID) | ORAL | Status: DC

## 2013-05-26 ENCOUNTER — Other Ambulatory Visit: Payer: Self-pay | Admitting: Family Medicine

## 2013-06-02 ENCOUNTER — Telehealth: Payer: Self-pay | Admitting: Family Medicine

## 2013-06-02 DIAGNOSIS — E1142 Type 2 diabetes mellitus with diabetic polyneuropathy: Secondary | ICD-10-CM

## 2013-06-02 MED ORDER — AMITRIPTYLINE HCL 10 MG PO TABS: ORAL_TABLET | ORAL | Status: DC

## 2013-06-02 NOTE — Telephone Encounter (Signed)
[facility] requesting new rx for amitriptyline (ELAVIL) 10 MG tablet # 90

## 2013-06-02 NOTE — Telephone Encounter (Signed)
[facility]

## 2013-06-06 ENCOUNTER — Other Ambulatory Visit: Payer: Self-pay | Admitting: Family Medicine

## 2013-07-06 ENCOUNTER — Telehealth: Payer: Self-pay | Admitting: Family Medicine

## 2013-07-06 DIAGNOSIS — M549 Dorsalgia, unspecified: Principal | ICD-10-CM

## 2013-07-06 DIAGNOSIS — G8929 Other chronic pain: Secondary | ICD-10-CM

## 2013-07-06 MED ORDER — HYDROCODONE-ACETAMINOPHEN 7.5-325 MG PO TABS: 1.0000 | ORAL_TABLET | Freq: Three times a day (TID) | ORAL | Status: DC | PRN

## 2013-07-06 NOTE — Telephone Encounter (Signed)
Rx ready for pick up and patient is aware 

## 2013-07-06 NOTE — Telephone Encounter (Signed)
Pt needs new rx hydrocodone °

## 2013-07-22 ENCOUNTER — Other Ambulatory Visit: Payer: Self-pay | Admitting: Family Medicine

## 2013-09-14 ENCOUNTER — Ambulatory Visit: Payer: 59 | Admitting: Family Medicine

## 2013-09-21 ENCOUNTER — Telehealth: Payer: Self-pay | Admitting: Family Medicine

## 2013-09-21 DIAGNOSIS — M549 Dorsalgia, unspecified: Principal | ICD-10-CM

## 2013-09-21 DIAGNOSIS — G8929 Other chronic pain: Secondary | ICD-10-CM

## 2013-09-21 MED ORDER — HYDROCODONE-ACETAMINOPHEN 7.5-325 MG PO TABS: 1.0000 | ORAL_TABLET | Freq: Three times a day (TID) | ORAL | Status: DC | PRN

## 2013-09-21 NOTE — Telephone Encounter (Signed)
rx ready for pick up and patient is aware  

## 2013-09-21 NOTE — Telephone Encounter (Signed)
Pt is needing new rx HYDROcodone-acetaminophen (NORCO) 7.5-325 MG per tablet please call when available for pick up ° °

## 2013-10-13 ENCOUNTER — Other Ambulatory Visit: Payer: Self-pay | Admitting: Family Medicine

## 2013-12-09 ENCOUNTER — Other Ambulatory Visit: Payer: Self-pay | Admitting: Family Medicine

## 2013-12-09 DIAGNOSIS — I10 Essential (primary) hypertension: Secondary | ICD-10-CM

## 2013-12-09 DIAGNOSIS — E109 Type 1 diabetes mellitus without complications: Secondary | ICD-10-CM

## 2013-12-14 ENCOUNTER — Telehealth: Payer: Self-pay | Admitting: Family Medicine

## 2013-12-14 DIAGNOSIS — G8929 Other chronic pain: Secondary | ICD-10-CM

## 2013-12-14 DIAGNOSIS — M549 Dorsalgia, unspecified: Principal | ICD-10-CM

## 2013-12-14 NOTE — Telephone Encounter (Signed)
Pt called and would like script refill for HYDROcodone-acetaminophen (NORCO) 7.5-325 MG per tablet [[id number]][[id number]].;  Pt's spouse has appointment on 12/15/13 11:30 am and would like for her to pick up if possible.  Best number to call is [phone] / lt

## 2013-12-15 MED ORDER — HYDROCODONE-ACETAMINOPHEN 7.5-325 MG PO TABS: 1.0000 | ORAL_TABLET | Freq: Three times a day (TID) | ORAL | Status: DC | PRN

## 2013-12-15 NOTE — Telephone Encounter (Signed)
Rx ready for pick up and patient is aware 

## 2013-12-16 ENCOUNTER — Other Ambulatory Visit: Payer: Self-pay | Admitting: Family Medicine

## 2014-01-11 ENCOUNTER — Telehealth: Payer: Self-pay | Admitting: Family Medicine

## 2014-01-11 DIAGNOSIS — E109 Type 1 diabetes mellitus without complications: Secondary | ICD-10-CM

## 2014-01-11 MED ORDER — INSULIN LISPRO 100 UNIT/ML ~~LOC~~ SOLN: SUBCUTANEOUS | Status: DC

## 2014-01-11 NOTE — Telephone Encounter (Signed)
Rx sent to [facility]

## 2014-01-11 NOTE — Telephone Encounter (Signed)
Pt is almost out of insulin lispro (HUMALOG) 100 UNIT/ML injection [[id number]][[id number]] and [facility] states prescription has expired.  [facility]  Best number to call [phone]

## 2014-01-13 ENCOUNTER — Other Ambulatory Visit (INDEPENDENT_AMBULATORY_CARE_PROVIDER_SITE_OTHER): Payer: 59

## 2014-01-13 DIAGNOSIS — I10 Essential (primary) hypertension: Secondary | ICD-10-CM

## 2014-01-13 DIAGNOSIS — Z0000 Encounter for general adult medical examination without abnormal findings: Secondary | ICD-10-CM

## 2014-01-13 DIAGNOSIS — E109 Type 1 diabetes mellitus without complications: Secondary | ICD-10-CM

## 2014-01-13 LAB — CBC WITH DIFFERENTIAL/PLATELET
BASOS ABS: 0 10*3/uL (ref 0.0–0.1)
Basophils Relative: 0.5 % (ref 0.0–3.0)
Eosinophils Absolute: 0.3 10*3/uL (ref 0.0–0.7)
Eosinophils Relative: 3.8 % (ref 0.0–5.0)
HCT: 38.8 % — ABNORMAL LOW (ref 39.0–52.0)
HEMOGLOBIN: 12.9 g/dL — AB (ref 13.0–17.0)
Lymphocytes Relative: 33.8 % (ref 12.0–46.0)
Lymphs Abs: 2.3 10*3/uL (ref 0.7–4.0)
MCHC: 33.2 g/dL (ref 30.0–36.0)
MCV: 93.3 fl (ref 78.0–100.0)
MONO ABS: 0.5 10*3/uL (ref 0.1–1.0)
MONOS PCT: 7.8 % (ref 3.0–12.0)
NEUTROS ABS: 3.7 10*3/uL (ref 1.4–7.7)
Neutrophils Relative %: 54.1 % (ref 43.0–77.0)
PLATELETS: 245 10*3/uL (ref 150.0–400.0)
RBC: 4.17 Mil/uL — ABNORMAL LOW (ref 4.22–5.81)
RDW: 13.8 % (ref 11.5–15.5)
WBC: 6.8 10*3/uL (ref 4.0–10.5)

## 2014-01-13 LAB — PSA: PSA: 0.45 ng/mL (ref 0.10–4.00)

## 2014-01-13 LAB — TSH: TSH: 0.64 u[IU]/mL (ref 0.35–4.50)

## 2014-01-13 LAB — BASIC METABOLIC PANEL
BUN: 16 mg/dL (ref 6–23)
CALCIUM: 9.4 mg/dL (ref 8.4–10.5)
CO2: 29 meq/L (ref 19–32)
Chloride: 101 mEq/L (ref 96–112)
Creatinine, Ser: 1 mg/dL (ref 0.4–1.5)
GFR: 83.71 mL/min (ref >60.00)
Glucose, Bld: 217 mg/dL — ABNORMAL HIGH (ref 70–99)
Potassium: 4.7 mEq/L (ref 3.5–5.1)
SODIUM: 136 meq/L (ref 135–145)

## 2014-01-13 LAB — LIPID PANEL
CHOLESTEROL: 187 mg/dL (ref 0–200)
HDL: 58.8 mg/dL (ref >39.00)
LDL Cholesterol: 118 mg/dL — ABNORMAL HIGH (ref 0–99)
NonHDL: 128.2
Total CHOL/HDL Ratio: 3
Triglycerides: 52 mg/dL (ref 0.0–149.0)
VLDL: 10.4 mg/dL (ref 0.0–40.0)

## 2014-01-13 LAB — POCT URINALYSIS DIPSTICK
BILIRUBIN UA: NEGATIVE
KETONES UA: NEGATIVE
LEUKOCYTES UA: NEGATIVE
NITRITE UA: NEGATIVE
PROTEIN UA: NEGATIVE
Spec Grav, UA: 1.015
Urobilinogen, UA: 1
pH, UA: 6

## 2014-01-13 LAB — MICROALBUMIN / CREATININE URINE RATIO
Creatinine,U: 83.1 mg/dL
MICROALB/CREAT RATIO: 1.4 mg/g (ref 0.0–30.0)
Microalb, Ur: 1.2 mg/dL (ref 0.0–1.9)

## 2014-01-13 LAB — HEPATIC FUNCTION PANEL
ALK PHOS: 26 U/L — AB (ref 39–117)
ALT: 14 U/L (ref 0–53)
AST: 15 U/L (ref 0–37)
Albumin: 3.8 g/dL (ref 3.5–5.2)
BILIRUBIN DIRECT: 0.1 mg/dL (ref 0.0–0.3)
BILIRUBIN TOTAL: 0.4 mg/dL (ref 0.2–1.2)
Total Protein: 6.9 g/dL (ref 6.0–8.3)

## 2014-01-13 LAB — HEMOGLOBIN A1C: Hgb A1c MFr Bld: 9.3 % — ABNORMAL HIGH (ref 4.6–6.5)

## 2014-01-19 ENCOUNTER — Encounter: Payer: Self-pay | Admitting: Family Medicine

## 2014-01-19 ENCOUNTER — Ambulatory Visit (INDEPENDENT_AMBULATORY_CARE_PROVIDER_SITE_OTHER): Payer: 59 | Admitting: Family Medicine

## 2014-01-19 VITALS — BP 120/70 | Temp 98.8°F | Ht 71.0 in | Wt 208.0 lb

## 2014-01-19 DIAGNOSIS — F411 Generalized anxiety disorder: Secondary | ICD-10-CM

## 2014-01-19 DIAGNOSIS — I872 Venous insufficiency (chronic) (peripheral): Secondary | ICD-10-CM

## 2014-01-19 DIAGNOSIS — E109 Type 1 diabetes mellitus without complications: Secondary | ICD-10-CM

## 2014-01-19 DIAGNOSIS — Z23 Encounter for immunization: Secondary | ICD-10-CM

## 2014-01-19 DIAGNOSIS — J449 Chronic obstructive pulmonary disease, unspecified: Secondary | ICD-10-CM

## 2014-01-19 DIAGNOSIS — E1142 Type 2 diabetes mellitus with diabetic polyneuropathy: Secondary | ICD-10-CM

## 2014-01-19 DIAGNOSIS — F17200 Nicotine dependence, unspecified, uncomplicated: Secondary | ICD-10-CM

## 2014-01-19 DIAGNOSIS — I10 Essential (primary) hypertension: Secondary | ICD-10-CM

## 2014-01-19 MED ORDER — CLONIDINE HCL 0.1 MG PO TABS: 0.1000 mg | ORAL_TABLET | Freq: Every evening | ORAL | Status: DC

## 2014-01-19 MED ORDER — TRIAMCINOLONE ACETONIDE 0.1 % EX OINT: TOPICAL_OINTMENT | Freq: Two times a day (BID) | CUTANEOUS | Status: DC

## 2014-01-19 MED ORDER — LISINOPRIL 40 MG PO TABS: 40.0000 mg | ORAL_TABLET | Freq: Every day | ORAL | Status: DC

## 2014-01-19 MED ORDER — FUROSEMIDE 20 MG PO TABS: 20.0000 mg | ORAL_TABLET | Freq: Every evening | ORAL | Status: DC

## 2014-01-19 MED ORDER — BUSPIRONE HCL 30 MG PO TABS: ORAL_TABLET | ORAL | Status: DC

## 2014-01-19 MED ORDER — AMITRIPTYLINE HCL 10 MG PO TABS: ORAL_TABLET | ORAL | Status: DC

## 2014-01-19 MED ORDER — INSULIN LISPRO 100 UNIT/ML ~~LOC~~ SOLN
SUBCUTANEOUS | Status: DC
Start: 2014-01-19 — End: 2014-12-23

## 2014-01-19 MED ORDER — GABAPENTIN 300 MG PO CAPS: ORAL_CAPSULE | ORAL | Status: DC

## 2014-01-19 NOTE — Patient Instructions (Signed)
Call and make an appointment to see a dentist immediately  We will get you set up a consult with Tollie Eth to help control your diabetes  Walk 30 minutes daily  No tobacco  Followup with me in 4-6 weeks

## 2014-01-19 NOTE — Progress Notes (Signed)
Pre visit review using our clinic review tool, if applicable. No additional management support is needed unless otherwise documented below in the visit note. Lab Results  Component Value Date   HGBA1C 9.3* 01/13/2014   HGBA1C 9.6* 12/02/2012   HGBA1C 9.7* 08/07/2011   Lab Results  Component Value Date   MICROALBUR 1.2 01/13/2014   LDLCALC 118* 01/13/2014   CREATININE 1.0 01/13/2014

## 2014-01-19 NOTE — Progress Notes (Signed)
   Subjective:    Patient ID: Jeremy Gill, male    DOB: Nov 04, 1945, 69 y.o.   MRN: [id number]  HPI Jeremy Gill is a 68 year old male who comes in today for evaluation of anxiety, allergic rhinitis, hypertension, diabetes type 1 uncontrolled, neuropathy secondary to diabetes, chronic back pain, hypertension  He's been seeing endocrinology his A1c is 9.3%. We will get him a consult with Tollie Eth.  He continues to smoke despite knowing the complications of tobacco use hypertension and diabetes  Vaccinations updated by Fleet Contras   Review of Systems  Constitutional: Negative.   HENT: Negative.   Eyes: Negative.   Respiratory: Negative.   Cardiovascular: Negative.   Gastrointestinal: Negative.   Genitourinary: Negative.   Musculoskeletal: Negative.   Skin: Negative.   Neurological: Negative.   Psychiatric/Behavioral: Negative.        Objective:   Physical Exam  Nursing note and vitals reviewed. Constitutional: He is oriented to person, place, and time. He appears well-developed and well-nourished. No distress.  Smells of tobacco,,,,,,, pipe smoker,,,, 5 bowls a day  HENT:  Head: Normocephalic and atraumatic.  Right Ear: External ear normal.  Left Ear: External ear normal.  Nose: Nose normal.  Mouth/Throat: No oropharyngeal exudate.  Teeth are black from chronic tobacco abuse he states extensive gum disease  Eyes: Conjunctivae and EOM are normal. Pupils are equal, round, and reactive to light. Right eye exhibits no discharge. Left eye exhibits no discharge. Scleral icterus is present.  Neck: Normal range of motion. Neck supple. No JVD present. No tracheal deviation present. No thyromegaly present.  Cardiovascular: Normal rate, regular rhythm and intact distal pulses.  Exam reveals no gallop and no friction rub.   Murmur heard. New grade 2/6 systolic ejection murmur question MR////////// cardiac consult for evaluation  Pulmonary/Chest: Effort normal and breath sounds normal. No  stridor. No respiratory distress. He has no wheezes. He has no rales. He exhibits no tenderness.  Abdominal: Soft. Bowel sounds are normal. He exhibits no distension and no mass. There is no tenderness. There is no rebound and no guarding.  Genitourinary: Rectum normal and penis normal. Guaiac negative stool. No penile tenderness.  1+ symmetrical BPH  Musculoskeletal: Normal range of motion. He exhibits no edema and no tenderness.  Lymphadenopathy:    He has no cervical adenopathy.  Neurological: He is alert and oriented to person, place, and time. He has normal reflexes. No cranial nerve deficit. He exhibits normal muscle tone.  Skin: Skin is warm and dry. No rash noted. He is not diaphoretic. No erythema. No pallor.  Psychiatric: He has a normal mood and affect. His behavior is normal. Judgment and thought content normal.          Assessment & Plan:  Diabetes type 1 not at goal........ consult with Tollie Eth  Hypertension at goal continue current therapy  Anxiety continue BuSpar  Extensive gum disease with rotten teeth........... dental consult stat  Peripheral edema continue Lasix 20 mg daily  Neuropathy secondary to diabetes continue Neurontin  Chronic back pain on Narco one tablet 3 times daily decrease Motrin to 400 twice daily   Tobacco abuse............. stop smoking     Explained to Jeremy Gill that he is in severe severe risk factors for having a heart attack stroke kidney failure etc. etc. from his uncontrolled diabetes poor nutrition rotten teeth etc.

## 2014-01-20 ENCOUNTER — Telehealth: Payer: Self-pay | Admitting: Family Medicine

## 2014-01-20 NOTE — Telephone Encounter (Signed)
Relevant patient education mailed to patient.  

## 2014-03-19 ENCOUNTER — Telehealth: Payer: Self-pay | Admitting: Family Medicine

## 2014-03-19 DIAGNOSIS — E109 Type 1 diabetes mellitus without complications: Secondary | ICD-10-CM

## 2014-03-19 DIAGNOSIS — M549 Dorsalgia, unspecified: Secondary | ICD-10-CM

## 2014-03-19 DIAGNOSIS — G8929 Other chronic pain: Secondary | ICD-10-CM

## 2014-03-19 MED ORDER — INSULIN GLARGINE 100 UNIT/ML ~~LOC~~ SOLN: SUBCUTANEOUS | Status: DC

## 2014-03-19 NOTE — Telephone Encounter (Signed)
Pt would like new rx hydrocodone °

## 2014-03-19 NOTE — Telephone Encounter (Signed)
[facility] - [place], [place] - [address] is requesting re-fill on IBUPROFEN TABS 800 MG

## 2014-03-19 NOTE — Telephone Encounter (Signed)
Lantus sent to pharmacy.  Okay to fill hydrocodone?

## 2014-03-23 MED ORDER — HYDROCODONE-ACETAMINOPHEN 7.5-325 MG PO TABS: 1.0000 | ORAL_TABLET | Freq: Three times a day (TID) | ORAL | Status: DC | PRN

## 2014-03-23 NOTE — Telephone Encounter (Signed)
Denied per Dr Tawanna Coolerodd.  Ibuprofen can be bought OTC.

## 2014-03-23 NOTE — Telephone Encounter (Signed)
Rx faxed

## 2014-03-25 ENCOUNTER — Telehealth: Payer: Self-pay | Admitting: Family Medicine

## 2014-03-25 NOTE — Telephone Encounter (Signed)
Pt would like you to call him.. Refused to elaborate, saying he wants to discuss something with you..pls call.

## 2014-03-25 NOTE — Telephone Encounter (Signed)
Attempted to call patient but no answer.

## 2014-03-25 NOTE — Telephone Encounter (Signed)
Patient will pick up the hard copy of his hydrocodone

## 2014-03-30 ENCOUNTER — Encounter: Payer: Self-pay | Admitting: Physical Medicine & Rehabilitation

## 2014-04-08 ENCOUNTER — Telehealth: Payer: Self-pay | Admitting: Family Medicine

## 2014-04-08 NOTE — Telephone Encounter (Signed)
[facility] - [place], [place] - [address] is requesting re-fill on ONE TOUCH VERIO GOLD STRP 100

## 2014-04-09 ENCOUNTER — Ambulatory Visit: Payer: 59 | Admitting: Physical Medicine & Rehabilitation

## 2014-04-09 MED ORDER — GLUCOSE BLOOD VI STRP: ORAL_STRIP | Status: DC

## 2014-04-09 NOTE — Telephone Encounter (Signed)
Rx sent 

## 2014-04-20 ENCOUNTER — Telehealth: Payer: Self-pay | Admitting: Family Medicine

## 2014-04-20 MED ORDER — GLUCOSE BLOOD VI STRP
ORAL_STRIP | Status: DC
Start: 2014-04-20 — End: 2016-12-18

## 2014-04-20 NOTE — Telephone Encounter (Signed)
Pt would like rachel to return his call concerning medication issue

## 2014-04-20 NOTE — Telephone Encounter (Signed)
Rx sent to pharmacy   

## 2014-09-24 ENCOUNTER — Telehealth: Payer: Self-pay | Admitting: Family Medicine

## 2014-09-24 DIAGNOSIS — G8929 Other chronic pain: Secondary | ICD-10-CM

## 2014-09-24 DIAGNOSIS — M549 Dorsalgia, unspecified: Principal | ICD-10-CM

## 2014-09-24 NOTE — Telephone Encounter (Signed)
Pt needs new rx hydrocodone °

## 2014-09-27 MED ORDER — HYDROCODONE-ACETAMINOPHEN 7.5-325 MG PO TABS: 1.0000 | ORAL_TABLET | Freq: Three times a day (TID) | ORAL | Status: DC | PRN

## 2014-09-27 NOTE — Telephone Encounter (Signed)
Rx ready for pick up and patient is aware.  He is also aware of having his hydrocodone refilled by a new provider or have a referral for pain medication.

## 2014-11-23 ENCOUNTER — Telehealth: Payer: Self-pay | Admitting: *Deleted

## 2014-11-23 DIAGNOSIS — E109 Type 1 diabetes mellitus without complications: Secondary | ICD-10-CM

## 2014-11-23 NOTE — Telephone Encounter (Signed)
Spoke with patient and he would like to wait to get his a1c because he does not have insurance at this time. a1c ordered Diabetic bundle

## 2014-12-23 ENCOUNTER — Other Ambulatory Visit: Payer: Self-pay | Admitting: *Deleted

## 2014-12-23 DIAGNOSIS — I10 Essential (primary) hypertension: Secondary | ICD-10-CM

## 2014-12-23 DIAGNOSIS — E108 Type 1 diabetes mellitus with unspecified complications: Secondary | ICD-10-CM

## 2014-12-23 DIAGNOSIS — E109 Type 1 diabetes mellitus without complications: Secondary | ICD-10-CM

## 2014-12-23 DIAGNOSIS — F17200 Nicotine dependence, unspecified, uncomplicated: Secondary | ICD-10-CM

## 2014-12-23 DIAGNOSIS — E0842 Diabetes mellitus due to underlying condition with diabetic polyneuropathy: Secondary | ICD-10-CM

## 2014-12-23 MED ORDER — AMITRIPTYLINE HCL 10 MG PO TABS: ORAL_TABLET | ORAL | Status: DC

## 2014-12-23 MED ORDER — CLONIDINE HCL 0.1 MG PO TABS: 0.1000 mg | ORAL_TABLET | Freq: Every evening | ORAL | Status: DC

## 2014-12-23 MED ORDER — FLUOCINONIDE 0.05 % EX CREA: TOPICAL_CREAM | Freq: Three times a day (TID) | CUTANEOUS | Status: DC | PRN

## 2014-12-23 MED ORDER — LISINOPRIL 40 MG PO TABS: 40.0000 mg | ORAL_TABLET | Freq: Every day | ORAL | Status: DC

## 2014-12-23 MED ORDER — TRIAMCINOLONE ACETONIDE 0.1 % EX OINT: TOPICAL_OINTMENT | Freq: Two times a day (BID) | CUTANEOUS | Status: DC

## 2014-12-23 MED ORDER — INSULIN GLARGINE 100 UNIT/ML ~~LOC~~ SOLN: SUBCUTANEOUS | Status: DC

## 2014-12-23 MED ORDER — BUSPIRONE HCL 30 MG PO TABS: ORAL_TABLET | ORAL | Status: DC

## 2014-12-23 MED ORDER — FUROSEMIDE 20 MG PO TABS: 20.0000 mg | ORAL_TABLET | Freq: Every evening | ORAL | Status: DC

## 2014-12-23 MED ORDER — GABAPENTIN 300 MG PO CAPS: ORAL_CAPSULE | ORAL | Status: DC

## 2014-12-23 MED ORDER — INSULIN LISPRO 100 UNIT/ML ~~LOC~~ SOLN: SUBCUTANEOUS | Status: DC

## 2014-12-27 ENCOUNTER — Telehealth: Payer: Self-pay | Admitting: Family Medicine

## 2014-12-27 ENCOUNTER — Other Ambulatory Visit (INDEPENDENT_AMBULATORY_CARE_PROVIDER_SITE_OTHER): Payer: Self-pay

## 2014-12-27 DIAGNOSIS — M549 Dorsalgia, unspecified: Principal | ICD-10-CM

## 2014-12-27 DIAGNOSIS — E109 Type 1 diabetes mellitus without complications: Secondary | ICD-10-CM

## 2014-12-27 DIAGNOSIS — I10 Essential (primary) hypertension: Secondary | ICD-10-CM

## 2014-12-27 DIAGNOSIS — G8929 Other chronic pain: Secondary | ICD-10-CM

## 2014-12-27 LAB — BASIC METABOLIC PANEL
BUN: 21 mg/dL (ref 6–23)
CO2: 29 mEq/L (ref 19–32)
CREATININE: 1.05 mg/dL (ref 0.40–1.50)
Calcium: 9.8 mg/dL (ref 8.4–10.5)
Chloride: 102 mEq/L (ref 96–112)
GFR: 74.37 mL/min (ref >60.00)
Glucose, Bld: 154 mg/dL — ABNORMAL HIGH (ref 70–99)
POTASSIUM: 4.4 meq/L (ref 3.5–5.1)
Sodium: 141 mEq/L (ref 135–145)

## 2014-12-27 LAB — HEMOGLOBIN A1C: Hgb A1c MFr Bld: 8.5 % — ABNORMAL HIGH (ref 4.6–6.5)

## 2014-12-27 NOTE — Telephone Encounter (Signed)
Referral placed.

## 2014-12-27 NOTE — Telephone Encounter (Signed)
Pt was referred to [facility] back in Nov 2015 and did not keep the appt . Today he was in asking if he can be referred again. Would like a call back.Marland Kitchen

## 2015-01-13 ENCOUNTER — Other Ambulatory Visit: Payer: Self-pay | Admitting: Family Medicine

## 2015-01-13 DIAGNOSIS — E1142 Type 2 diabetes mellitus with diabetic polyneuropathy: Secondary | ICD-10-CM

## 2015-03-29 ENCOUNTER — Ambulatory Visit (INDEPENDENT_AMBULATORY_CARE_PROVIDER_SITE_OTHER): Payer: Self-pay

## 2015-03-29 DIAGNOSIS — Z23 Encounter for immunization: Secondary | ICD-10-CM

## 2015-07-26 ENCOUNTER — Encounter: Payer: Self-pay | Admitting: Gastroenterology

## 2015-08-26 ENCOUNTER — Telehealth: Payer: Self-pay | Admitting: Internal Medicine

## 2015-08-26 NOTE — Telephone Encounter (Signed)
Patient came in requesting medication refill for Lantus Insulin 100 units. [facility]   Patient aware he came in late to request the refill and it will be done Monday, April 3rd 2017. Not enough time to do it today.

## 2015-09-23 NOTE — Telephone Encounter (Signed)
Called patient and he states everything has been taken care of by his PCP

## 2015-09-27 ENCOUNTER — Other Ambulatory Visit: Payer: Self-pay | Admitting: Family Medicine

## 2015-11-28 ENCOUNTER — Encounter (HOSPITAL_COMMUNITY): Payer: Self-pay | Admitting: *Deleted

## 2015-11-28 ENCOUNTER — Emergency Department (HOSPITAL_COMMUNITY)
Admission: EM | Admit: 2015-11-28 | Discharge: 2015-11-28 | Disposition: A | Discharge status: Home / Self Care | Payer: Medicare Other | Attending: Emergency Medicine | Admitting: Emergency Medicine

## 2015-11-28 DIAGNOSIS — Z87891 Personal history of nicotine dependence: Secondary | ICD-10-CM | POA: Insufficient documentation

## 2015-11-28 DIAGNOSIS — Z794 Long term (current) use of insulin: Secondary | ICD-10-CM | POA: Diagnosis not present

## 2015-11-28 DIAGNOSIS — J449 Chronic obstructive pulmonary disease, unspecified: Secondary | ICD-10-CM | POA: Diagnosis not present

## 2015-11-28 DIAGNOSIS — Z79891 Long term (current) use of opiate analgesic: Secondary | ICD-10-CM | POA: Diagnosis not present

## 2015-11-28 DIAGNOSIS — Z79899 Other long term (current) drug therapy: Secondary | ICD-10-CM | POA: Diagnosis not present

## 2015-11-28 DIAGNOSIS — M549 Dorsalgia, unspecified: Secondary | ICD-10-CM

## 2015-11-28 DIAGNOSIS — G8929 Other chronic pain: Secondary | ICD-10-CM | POA: Diagnosis not present

## 2015-11-28 DIAGNOSIS — Z7984 Long term (current) use of oral hypoglycemic drugs: Secondary | ICD-10-CM | POA: Insufficient documentation

## 2015-11-28 DIAGNOSIS — E109 Type 1 diabetes mellitus without complications: Secondary | ICD-10-CM | POA: Insufficient documentation

## 2015-11-28 DIAGNOSIS — M545 Low back pain: Secondary | ICD-10-CM | POA: Insufficient documentation

## 2015-11-28 DIAGNOSIS — I10 Essential (primary) hypertension: Secondary | ICD-10-CM | POA: Insufficient documentation

## 2015-11-28 MED ORDER — OXYCODONE-ACETAMINOPHEN 5-325 MG PO TABS
2.0000 | ORAL_TABLET | Freq: Once | ORAL | Status: AC
  Administered 2015-11-28: 2 via ORAL
  Filled 2015-11-28: qty 2

## 2015-11-28 MED ORDER — METHOCARBAMOL 500 MG PO TABS: 500.0000 mg | ORAL_TABLET | Freq: Two times a day (BID) | ORAL | Status: DC

## 2015-11-28 MED ORDER — DIAZEPAM 5 MG PO TABS
5.0000 mg | ORAL_TABLET | Freq: Once | ORAL | Status: AC
  Administered 2015-11-28: 5 mg via ORAL
  Filled 2015-11-28: qty 1

## 2015-11-28 MED ORDER — INSULIN GLARGINE 100 UNIT/ML ~~LOC~~ SOLN: 20.0000 [IU] | Freq: Every day | SUBCUTANEOUS | Status: DC

## 2015-11-28 MED ORDER — OXYCODONE-ACETAMINOPHEN 5-325 MG PO TABS: 1.0000 | ORAL_TABLET | Freq: Four times a day (QID) | ORAL | Status: DC | PRN

## 2015-11-28 NOTE — Discharge Instructions (Signed)

## 2015-11-28 NOTE — ED Notes (Signed)
MD at bedside. 

## 2015-11-28 NOTE — ED Notes (Signed)
Pt complains of back pain that has made ambulating difficult for the past 5 days. Pt states he has had back pain for months. Pt denies injury to back. Pt also complains of tingling and weakness in his arms for the past 5 days

## 2015-11-28 NOTE — ED Provider Notes (Signed)
CSN: [id number]     Arrival date & time 11/28/15  1434 History   First MD Initiated Contact with Patient 11/28/15 1616     Chief Complaint  Patient presents with  . Back Pain     (Consider location/radiation/quality/duration/timing/severity/associated sxs/prior Treatment) HPI Comments: Patient here complaining of lower back pain times several days. Has had a three-year history of chronic back pain that became more acute over the past several days. Denies any bowel or bladder dysfunction. Denies any saddle anesthesias. Is able to ambulate but that does make the pain worse and his pain is characterized as sharp. His current pain is no different than his prior symptoms. Denies any recent history of trauma. Was on hydrocodone in the past but has not been on that for several years. Has an appointment to be seen by a new physician in 2 days. He also states he is out of his insulin.  Patient is a 70 y.o. male presenting with back pain. The history is provided by the patient and the spouse.  Back Pain   Past Medical History  Diagnosis Date  . DIABETES MELLITUS, TYPE I 10/25/2006  . ANXIETY 10/25/2006  . COPD 10/25/2006  . HYPERTENSION 10/25/2006  . 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 Polyneuropathy in diabetes(357.2) 04/08/2007  . VENOUS INSUFFICIENCY 11/25/2007  . TOBACCO ABUSE 07/08/2007  . Psoriasis    Past Surgical History  Procedure Laterality Date  . Testicle removal      R testicle   Family History  Problem Relation Age of Onset  . Cancer Mother     Unknown   . Diabetes Sister    Social History  Substance Use Topics  . Smoking status: Former Smoker    Quit date: 05/29/2011  . Smokeless tobacco: None  . Alcohol Use: No    Review of Systems  Musculoskeletal: Positive for back pain.  All other systems reviewed and are negative.     Allergies  Review of patient's allergies indicates no known allergies.  Home Medications   Prior to Admission medications   Medication Sig Start Date  End Date Taking? Authorizing Provider  amitriptyline (ELAVIL) 10 MG tablet TAKE 1 TABLET AT BEDTIME 12/23/14   Roderick PeeJeffrey A Todd, MD  aspirin 81 MG tablet Take 81 mg by mouth. On M and F    Historical Provider, MD  BD INSULIN SYRINGE ULTRAFINE 31G X 5/16" 0.5 ML MISC USE DAILY 10/13/13   Roderick PeeJeffrey A Todd, MD  busPIRone (BUSPAR) 30 MG tablet TAKE 1 TABLET TWICE A DAY 12/23/14   Roderick PeeJeffrey A Todd, MD  cloNIDine (CATAPRES) 0.1 MG tablet Take 1 tablet (0.1 mg total) by mouth every evening. 12/23/14   Roderick PeeJeffrey A Todd, MD  fluocinonide cream (LIDEX) 0.05 % Apply topically 3 (three) times daily as needed. For itching 12/23/14   Roderick PeeJeffrey A Todd, MD  furosemide (LASIX) 20 MG tablet Take 1 tablet (20 mg total) by mouth every evening. 12/23/14   Roderick PeeJeffrey A Todd, MD  gabapentin (NEURONTIN) 300 MG capsule TAKE 1 CAPSULE THREE TIMES A DAY 12/23/14   Roderick PeeJeffrey A Todd, MD  glucose blood test strip One touch verio.  Use up to 4 times a day.  E10.9 04/20/14   Roderick PeeJeffrey A Todd, MD  HYDROcodone-acetaminophen (NORCO) 7.5-325 MG per tablet Take 1 tablet by mouth 3 (three) times daily as needed. 09/27/14   Roderick PeeJeffrey A Todd, MD  insulin glargine (LANTUS) 100 UNIT/ML injection 16 units at bedtime 12/23/14   Roderick PeeJeffrey A Todd, MD  insulin lispro (HUMALOG)  100 UNIT/ML injection 16 units before each meal 12/23/14   Roderick PeeJeffrey A Todd, MD  Insulin Syringe-Needle U-100 (BD INSULIN SYRINGE ULTRAFINE) 31G X 5/16" 0.5 ML MISC  08/06/12   Roderick PeeJeffrey A Todd, MD  Lancets [facility](ONETOUCH ULTRASOFT) lancets QID Dx 250.01 08/29/12   Roderick PeeJeffrey A Todd, MD  lisinopril (PRINIVIL,ZESTRIL) 40 MG tablet Take 1 tablet (40 mg total) by mouth daily. 12/23/14   Roderick PeeJeffrey A Todd, MD  triamcinolone ointment (KENALOG) 0.1 % Apply topically 2 (two) times daily. 1:1 compound with Eucerin 12/23/14   Roderick PeeJeffrey A Todd, MD   BP 106/66 mmHg  Pulse 92  Temp(Src) 98.2 F (36.8 C) (Oral)  Resp 18  SpO2 94% Physical Exam  Constitutional: He is oriented to person, place, and time. He appears well-developed  and well-nourished.  Non-toxic appearance. No distress.  HENT:  Head: Normocephalic and atraumatic.  Eyes: Conjunctivae, EOM and lids are normal. Pupils are equal, round, and reactive to light.  Neck: Normal range of motion. Neck supple. No tracheal deviation present. No thyroid mass present.  Cardiovascular: Normal rate, regular rhythm and normal heart sounds.  Exam reveals no gallop.   No murmur heard. Pulmonary/Chest: Effort normal and breath sounds normal. No stridor. No respiratory distress. He has no decreased breath sounds. He has no wheezes. He has no rhonchi. He has no rales.  Abdominal: Soft. Normal appearance and bowel sounds are normal. He exhibits no distension. There is no tenderness. There is no rebound and no CVA tenderness.  Musculoskeletal: Normal range of motion. He exhibits no edema or tenderness.       Arms: Neurological: He is alert and oriented to person, place, and time. He displays no tremor. No cranial nerve deficit or sensory deficit. Coordination and gait normal. GCS eye subscore is 4. GCS verbal subscore is 5. GCS motor subscore is 6.  Reflex Scores:      Patellar reflexes are 1+ on the right side and 1+ on the left side. Patient with family in the department without assistance  Skin: Skin is warm and dry. No abrasion and no rash noted.  Psychiatric: He has a normal mood and affect. His speech is normal and behavior is normal.  Nursing note and vitals reviewed.   ED Course  Procedures (including critical care time) Labs Review Labs Reviewed - No data to display  Imaging Review No results found. I have personally reviewed and evaluated these images and lab results as part of my medical decision-making.   EKG Interpretation None      MDM   Final diagnoses:  None    Patient with acute on chronic back pain. No red flags for cauda equina. Patient medicated for pain here in give short course of pain medication as well as a refill of his insulin. Return  precautions given    Lorre NickAnthony Ekin Pilar, MD 11/28/15 1645

## 2015-11-28 NOTE — ED Notes (Signed)
Discharge instructions, follow up care, and rx x3 reviewed with patient. Patient verbalized understanding. 

## 2015-11-30 ENCOUNTER — Telehealth: Payer: Self-pay | Admitting: Family Medicine

## 2015-11-30 ENCOUNTER — Ambulatory Visit (INDEPENDENT_AMBULATORY_CARE_PROVIDER_SITE_OTHER): Payer: Medicare Other | Admitting: Family Medicine

## 2015-11-30 ENCOUNTER — Encounter: Payer: Self-pay | Admitting: Family Medicine

## 2015-11-30 VITALS — BP 126/82 | HR 89 | Temp 98.0°F | Resp 12 | Ht 71.0 in | Wt 214.0 lb

## 2015-11-30 DIAGNOSIS — G894 Chronic pain syndrome: Secondary | ICD-10-CM | POA: Diagnosis not present

## 2015-11-30 DIAGNOSIS — M545 Low back pain, unspecified: Secondary | ICD-10-CM

## 2015-11-30 DIAGNOSIS — E1342 Other specified diabetes mellitus with diabetic polyneuropathy: Secondary | ICD-10-CM | POA: Diagnosis not present

## 2015-11-30 DIAGNOSIS — M159 Polyosteoarthritis, unspecified: Secondary | ICD-10-CM | POA: Diagnosis not present

## 2015-11-30 DIAGNOSIS — M5440 Lumbago with sciatica, unspecified side: Secondary | ICD-10-CM

## 2015-11-30 HISTORY — DX: Polyosteoarthritis, unspecified: M15.9

## 2015-11-30 HISTORY — DX: Chronic pain syndrome: G89.4

## 2015-11-30 NOTE — Telephone Encounter (Signed)
It is Ok to hold on imaging if that is what he wants to do. I ordered lumbar X ray because it was reported as new onset. I am not sure if pain clinic will do lumbar X ray.  Thanks, BJ

## 2015-11-30 NOTE — Progress Notes (Signed)
HPI:  ACUTE VISIT:  Chief Complaint  Patient presents with  . Back Pain    [name] is a 70 y.o. male, who is here today complaining of back pain, today he is following on recent ER visit, 11/28/15.   Presented to the ER because lower back pain radiated to both LE to knees, severe, could not even move. He received  Rx for Methocarbamol and Percocet.  He states that pain is mildly better. He denies side effects from medications.  He denies any recent injury or unusual level of activity.  Pain is achy,  10/10 in intensity, with no associated LE numbness, tingling, urinary incontinence or retention, stool incontinence, or saddle anesthesia. He has Hx of DM I with polyneuropathy, c/o hand numbness, stable.  No rash or edema on area, fever, chills, or abnormal wt loss.  Denies prior Hx of back pain.  He has history of generalized arthralgias, requesting prescription for Percocet. According to patient his former PCP, Jeremy Gill was prescribing it, also mentions that he recommended pain management. Unstable gait, uses a cane.  Hx of anxiety on Buspirone.  He is on Gabapentin and Amitriptyline.     Review of Systems  Constitutional: Negative for fever, appetite change, fatigue and unexpected weight change.  HENT: Negative for mouth sores, nosebleeds and trouble swallowing.   Respiratory: Negative for cough, shortness of breath and wheezing.        + Tobacco use.   Cardiovascular: Negative for leg swelling.  Gastrointestinal: Negative for nausea, vomiting, abdominal pain and blood in stool.       No changes in bowel habits.  Genitourinary: Negative for dysuria, hematuria and decreased urine volume.  Musculoskeletal: Positive for back pain, arthralgias and gait problem. Negative for joint swelling.  Skin: Negative for color change and rash.  Neurological: Positive for numbness (hands,chronic). Negative for syncope, weakness and headaches.  Psychiatric/Behavioral:  Negative for confusion. The patient is nervous/anxious.       Current Outpatient Prescriptions on File Prior to Visit  Medication Sig Dispense Refill  . amitriptyline (ELAVIL) 10 MG tablet TAKE 1 TABLET AT BEDTIME 90 tablet 3  . aspirin 81 MG tablet Take 81 mg by mouth at bedtime. On M and F    . BD INSULIN SYRINGE ULTRAFINE 31G X 5/16" 0.5 ML MISC USE DAILY 100 each 1  . busPIRone (BUSPAR) 15 MG tablet Take 30 mg by mouth 2 (two) times daily.  3  . busPIRone (BUSPAR) 30 MG tablet TAKE 1 TABLET TWICE A DAY 180 tablet 3  . cloNIDine (CATAPRES) 0.1 MG tablet Take 1 tablet (0.1 mg total) by mouth every evening. (Patient taking differently: Take 0.1 mg by mouth daily as needed (high blood pressure). ) 90 tablet 3  . fluocinonide cream (LIDEX) 0.05 % Apply topically 3 (three) times daily as needed. For itching 30 g 3  . furosemide (LASIX) 20 MG tablet Take 1 tablet (20 mg total) by mouth every evening. (Patient taking differently: Take 20 mg by mouth daily. ) 90 tablet 3  . gabapentin (NEURONTIN) 600 MG tablet Take 600 mg by mouth 3 (three) times daily.  1  . glucose blood test strip One touch verio.  Use up to 4 times a day.  E10.9 400 each 3  . HYDROcodone-acetaminophen (NORCO) 7.5-325 MG per tablet Take 1 tablet by mouth 3 (three) times daily as needed. 180 tablet 0  . ibuprofen (ADVIL,MOTRIN) 800 MG tablet Take 800 mg by mouth 3 (  three) times daily.  1  . insulin aspart (NOVOLOG) 100 UNIT/ML injection Inject 15 Units into the skin 3 (three) times daily before meals.    . insulin glargine (LANTUS) 100 UNIT/ML injection Inject 0.2 mLs (20 Units total) into the skin at bedtime. 30 mL 5  . insulin lispro (HUMALOG) 100 UNIT/ML injection 16 units before each meal 3 vial 5  . Insulin Syringe-Needle U-100 (BD INSULIN SYRINGE ULTRAFINE) 31G X 5/16" 0.5 ML MISC     . Lancets (ONETOUCH ULTRASOFT) lancets QID Dx 250.01 100 each 12  . lisinopril (PRINIVIL,ZESTRIL) 40 MG tablet Take 1 tablet (40 mg total)  by mouth daily. 90 tablet 3  . methocarbamol (ROBAXIN) 500 MG tablet Take 1 tablet (500 mg total) by mouth 2 (two) times daily. 30 tablet 0  . oxyCODONE-acetaminophen (PERCOCET/ROXICET) 5-325 MG tablet Take 1-2 tablets by mouth every 6 (six) hours as needed for moderate pain or severe pain. 20 tablet 0  . triamcinolone ointment (KENALOG) 0.1 % Apply topically 2 (two) times daily. 1:1 compound with Eucerin 453.6 g 3   No current facility-administered medications on file prior to visit.     Past Medical History  Diagnosis Date  . DIABETES MELLITUS, TYPE I 10/25/2006  . ANXIETY 10/25/2006  . COPD 10/25/2006  . HYPERTENSION 10/25/2006  . 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 Polyneuropathy in diabetes(357.2) 04/08/2007  . VENOUS INSUFFICIENCY 11/25/2007  . TOBACCO ABUSE 07/08/2007  . Psoriasis    No Known Allergies  Social History   Social History  . Marital Status: Married    Spouse Name: N/A  . Number of Children: N/A  . Years of Education: N/A   Occupational History  . Self Employed    Social History Main Topics  . Smoking status: Former Smoker    Quit date: 05/29/2011  . Smokeless tobacco: None  . Alcohol Use: No  . Drug Use: No  . Sexual Activity: Not Asked   Other Topics Concern  . None   Social History Narrative   Regular exercise-no    Filed Vitals:   11/30/15 0839  BP: 126/82  Pulse: 89  Temp: 98 F (36.7 C)  Resp: 12   Body mass index is 29.86 kg/(m^2).   SpO2 Readings from Last 3 Encounters:  11/30/15 97%  11/28/15 99%  07/10/11 98%      Physical Exam  Nursing note and vitals reviewed. Constitutional: He is oriented to person, place, and time. He appears well-developed. No distress.  HENT:  Head: Atraumatic.  Mouth/Throat: Oropharynx is clear and moist. Abnormal dentition.  Eyes: Conjunctivae are normal.  Cardiovascular:  DP pulses present bilateral.  Respiratory: Effort normal and breath sounds normal. No respiratory distress.  GI: Soft. He exhibits  no mass. There is no tenderness.  Musculoskeletal: He exhibits edema (mild peri-ankle edema bilateral). He exhibits no tenderness.   No tenderness upon palpation of paraspinal muscles. Mild pain elicited with movement on exam table during examination. Knee crepitus bilateral, ROM elicits pain.     Neurological: He is alert and oriented to person, place, and time. He has normal strength.  Reflex Scores:      Patellar reflexes are 2+ on the right side and 2+ on the left side.      Achilles reflexes are 2+ on the right side and 2+ on the left side. SLR negative bilateral. Stable gait assisted with cane.  Psychiatric: He has a normal mood and affect.  Well groomed, good eye contact.      ASSESSMENT  AND PLAN:     Jeremy Gill was seen today for back pain.  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  Because he is reporting lower back pain as new onset, I think imaging is appropriate. I reviewed records after visit and on 01/19/14 documentation there is a comment about chronic back pain, during OV I asked his a few times and he denied prior Hx.  Educated about some side effects of medications prescribed recently in the ER. Local heat, relative rest recommended. PT may help and could be considered if still having pain in 2-3 weeks. Fall precautions discussed.  - In regard to Percocet request, I explained that it is a controlled medication with some side effects and this needs to be discussed with his PCP.  I see prior Rx for Hydrocodone -Acetaminophen 7.5 mg-325 mg in 2016. He would like referral to  pain clinic and sine it was recommended by Jeremy Gill (per pt report), I agree with placing referral.  -He is following with endocrinologists for DM I.  - Encouraged smoking cessation, not interested.    He needs to establish with PCP to continue following on his other chronic medical problems.     -[name] advised to return or notify a doctor immediately if symptoms worsen or persist or new concerns arise, he voices understanding.       Jeremy Peugh G. Swazil[place]Jordan, MD  [facility]. [place] office.

## 2015-11-30 NOTE — Telephone Encounter (Signed)
Pt need to know if it is okay to disregard your xray order due to the pain management require them to have one with them.

## 2015-11-30 NOTE — Progress Notes (Signed)
Pre visit review using our clinic review tool, if applicable. No additional management support is needed unless otherwise documented below in the visit note. 

## 2015-11-30 NOTE — Telephone Encounter (Signed)
Pt would like to know if you will make an exception and see him to establish?  Pt is a Jeremy Gill pt, wife sees [name] and mother in law sees [name]. Pt has asked for you , and declined anyone else.

## 2015-11-30 NOTE — Telephone Encounter (Signed)
I spoke with the patient's wife and informed her that they could wait on the x-ray we had put in to see if pain management does one. I did let them know that they could still get it incase pain management doesn't do an x-ray.

## 2015-11-30 NOTE — Patient Instructions (Addendum)
A few things to remember from today's visit:   [name] I have seen you today for an acute visit because your primary care provider was not available. Monitor for signs of worsening symptoms and seek immediate medical attention if any concerning/warning symptom as we discussed. If symptoms are not resolved in 1-2 weeks you should schedule a follow up appointment with your doctor, before if symptoms get worse.  Please continue following with your PCP for your other chronic medical problems and be sure you have an appointment already scheduled.   1. Low back pain with radiation, unspecified laterality  - DG Lumbar Spine Complete; Future    Back pain is very common in adults.The cause of back pain is rarely dangerous and the pain often gets better over time even with no pharmacologic treatment.  The cause of your back pain may not be known. Some common causes of back pain include: 1. Strain of the muscles or ligaments supporting the spine. 2. Wear and tear (degeneration) of the spinal disks. 3. Arthritis. 4. Direct injury to the back. 5.  For many people, back pain may return. Since back pain is rarely dangerous, most people can learn to manage this condition on their own.  HOME CARE INSTRUCTIONS Watch your back pain for any changes. The following actions may help to lessen any discomfort you are feeling: 1. Remain active. It is stressful on your back to sit or stand in one place for long periods of time. Do not sit, drive, or stand in one place for more than 30 minutes at a time. Take short walks on even surfaces as soon as you are able.Try to increase the length of time you walk each day. 2.  3. Exercise regularly as directed by your health care provider. Exercise helps your back heal faster. It also helps avoid future injury by keeping your muscles strong and flexible.  4. Do not stay in bed.Resting more than 1-2 days can delay your recovery.                          5. Pay attention to your body when you bend and lift. The most comfortable positions are those that put less stress on your recovering back.  6.  Always use proper lifting techniques, including: 1. Bending your knees. 2. Keeping the load close to your body. 3. Avoiding twisting.  7. Find a comfortable position to sleep. Use a firm mattress and lie on your side with your knees slightly bent. If you lie on your back, put a pillow under your knees.  8. Over the counter rubbing medications like Icy Hot or local heat might help.  Acetaminophen and/or Aleve/Ibuprofen can be taken if needed and if not contraindications. Local ice and heat may be alternated to reduce pain and spasms.     Muscle relaxants might or might not help, they cause drowsiness among other    side effects. They could also interact with some of medications you may be already taking (medications for depression/anxiety and some pain medications).   9. Maintain a healthy weight. Excess weight puts extra stress on your back and makes it difficult to maintain good posture.   SEEK MEDICAL CARE IF: worsening pain, associated fever, rash/edema on area, pain going to legs or buttocks, numbness/tingling, night pain, or abnormal weight loss.    SEEK IMMEDIATE MEDICAL CARE IF:  1. You develop new bowel or bladder control problems. 2. You have unusual  weakness or numbness in your arms or legs. 3. You develop nausea or vomiting. 4. You develop abdominal pain. 5. You feel faint.   Fall precautions.

## 2015-12-01 NOTE — Telephone Encounter (Signed)
Has already been seen by Dr. SwazilandJordan- She has more availability to establish folks as far as I know

## 2015-12-05 ENCOUNTER — Ambulatory Visit (INDEPENDENT_AMBULATORY_CARE_PROVIDER_SITE_OTHER)
Admission: RE | Admit: 2015-12-05 | Discharge: 2015-12-05 | Disposition: A | Discharge status: Home / Self Care | Payer: Medicare Other | Source: Ambulatory Visit | Attending: Family Medicine | Admitting: Family Medicine

## 2015-12-05 DIAGNOSIS — M5440 Lumbago with sciatica, unspecified side: Secondary | ICD-10-CM

## 2015-12-05 DIAGNOSIS — M545 Low back pain, unspecified: Secondary | ICD-10-CM

## 2015-12-05 DIAGNOSIS — M47816 Spondylosis without myelopathy or radiculopathy, lumbar region: Secondary | ICD-10-CM | POA: Diagnosis not present

## 2015-12-08 ENCOUNTER — Other Ambulatory Visit: Payer: Self-pay | Admitting: Family Medicine

## 2015-12-08 DIAGNOSIS — G8929 Other chronic pain: Secondary | ICD-10-CM

## 2015-12-08 DIAGNOSIS — M549 Dorsalgia, unspecified: Principal | ICD-10-CM

## 2015-12-21 DIAGNOSIS — M545 Low back pain: Secondary | ICD-10-CM | POA: Diagnosis not present

## 2015-12-21 DIAGNOSIS — E1142 Type 2 diabetes mellitus with diabetic polyneuropathy: Secondary | ICD-10-CM | POA: Diagnosis not present

## 2015-12-21 DIAGNOSIS — R2689 Other abnormalities of gait and mobility: Secondary | ICD-10-CM | POA: Diagnosis not present

## 2015-12-21 DIAGNOSIS — M5136 Other intervertebral disc degeneration, lumbar region: Secondary | ICD-10-CM | POA: Diagnosis not present

## 2015-12-27 ENCOUNTER — Other Ambulatory Visit: Payer: Self-pay | Admitting: Family Medicine

## 2015-12-27 DIAGNOSIS — I10 Essential (primary) hypertension: Secondary | ICD-10-CM

## 2015-12-27 DIAGNOSIS — E0842 Diabetes mellitus due to underlying condition with diabetic polyneuropathy: Secondary | ICD-10-CM

## 2015-12-27 NOTE — Telephone Encounter (Signed)
Pt is aware and declined to accept Dr Swaziland or Kandee Keen.

## 2015-12-28 NOTE — Telephone Encounter (Signed)
Pt need new Rx for Novolog, amitriptyline, buspirone and furosemide.   Pharm:  [facility] on [address].

## 2015-12-29 ENCOUNTER — Other Ambulatory Visit: Payer: Self-pay | Admitting: Emergency Medicine

## 2015-12-29 ENCOUNTER — Other Ambulatory Visit: Payer: Self-pay

## 2015-12-29 DIAGNOSIS — E0842 Diabetes mellitus due to underlying condition with diabetic polyneuropathy: Secondary | ICD-10-CM

## 2015-12-29 DIAGNOSIS — I10 Essential (primary) hypertension: Secondary | ICD-10-CM

## 2015-12-29 DIAGNOSIS — F17200 Nicotine dependence, unspecified, uncomplicated: Secondary | ICD-10-CM

## 2015-12-29 MED ORDER — AMITRIPTYLINE HCL 10 MG PO TABS: ORAL_TABLET | ORAL | 3 refills | Status: DC

## 2015-12-29 MED ORDER — FUROSEMIDE 20 MG PO TABS: 20.0000 mg | ORAL_TABLET | Freq: Every evening | ORAL | 3 refills | Status: DC

## 2015-12-29 MED ORDER — BUSPIRONE HCL 30 MG PO TABS: ORAL_TABLET | ORAL | 3 refills | Status: DC

## 2015-12-29 MED ORDER — INSULIN ASPART 100 UNIT/ML ~~LOC~~ SOLN: 15.0000 [IU] | Freq: Three times a day (TID) | SUBCUTANEOUS | 0 refills | Status: DC

## 2015-12-29 MED ORDER — FUROSEMIDE 20 MG PO TABS
20.0000 mg | ORAL_TABLET | Freq: Every evening | ORAL | 3 refills | Status: DC
Start: 2015-12-29 — End: 2016-02-08

## 2015-12-29 NOTE — Telephone Encounter (Signed)
Patient has not currently established care with a new PCP. Patient has an appointment scheduled with Dr. Swaziland on 9/13 at 9am. Dr. Swaziland agreed to fill the NovoLog for 1 month as long as patient has an appointment. Patient must keep appointment for future refills.

## 2016-01-04 DIAGNOSIS — M545 Low back pain: Secondary | ICD-10-CM | POA: Diagnosis not present

## 2016-01-06 DIAGNOSIS — M545 Low back pain: Secondary | ICD-10-CM | POA: Diagnosis not present

## 2016-01-10 DIAGNOSIS — M545 Low back pain: Secondary | ICD-10-CM | POA: Diagnosis not present

## 2016-01-12 DIAGNOSIS — M545 Low back pain: Secondary | ICD-10-CM | POA: Diagnosis not present

## 2016-01-17 DIAGNOSIS — M545 Low back pain: Secondary | ICD-10-CM | POA: Diagnosis not present

## 2016-01-19 ENCOUNTER — Telehealth: Payer: Self-pay | Admitting: Family Medicine

## 2016-01-19 NOTE — Telephone Encounter (Signed)
Wife would like you to call and discuss a medication. Declined to elaborate

## 2016-01-19 NOTE — Telephone Encounter (Signed)
Called and spoke with patient's wife. Patient has appointment with pain management in September. They were wanting to know if we can fill the hydrocodone for Mr. Jeremy Gill for when he goes on vacation so he will have it. Pain management said that would be fine. Please advise.

## 2016-01-20 DIAGNOSIS — M545 Low back pain: Secondary | ICD-10-CM | POA: Diagnosis not present

## 2016-01-20 NOTE — Telephone Encounter (Signed)
Called and spoke with patient's wife. They are going to come in Monday morning at 11am.

## 2016-01-20 NOTE — Telephone Encounter (Signed)
I have not seen Mr Jeremy Gill for pain management. I saw him for acute visit, lower back pain (reported as new onset). He was instructed then to follow with PCP. He could arrange a f/u appt and we can have a discussion but I do not fel comfortable doing so without appropriate evaluation.  Thanks, BJ

## 2016-01-23 ENCOUNTER — Encounter: Payer: Self-pay | Admitting: Family Medicine

## 2016-01-23 ENCOUNTER — Ambulatory Visit (INDEPENDENT_AMBULATORY_CARE_PROVIDER_SITE_OTHER): Payer: Medicare Other | Admitting: Family Medicine

## 2016-01-23 VITALS — BP 134/72 | HR 92 | Temp 97.8°F | Resp 12 | Ht 71.0 in | Wt 208.2 lb

## 2016-01-23 DIAGNOSIS — M545 Low back pain, unspecified: Secondary | ICD-10-CM

## 2016-01-23 DIAGNOSIS — M5440 Lumbago with sciatica, unspecified side: Secondary | ICD-10-CM | POA: Diagnosis not present

## 2016-01-23 DIAGNOSIS — G894 Chronic pain syndrome: Secondary | ICD-10-CM

## 2016-01-23 DIAGNOSIS — E1342 Other specified diabetes mellitus with diabetic polyneuropathy: Secondary | ICD-10-CM | POA: Diagnosis not present

## 2016-01-23 HISTORY — DX: Low back pain, unspecified: M54.50

## 2016-01-23 MED ORDER — HYDROCODONE-ACETAMINOPHEN 7.5-325 MG PO TABS: 1.0000 | ORAL_TABLET | Freq: Three times a day (TID) | ORAL | 0 refills | Status: DC | PRN

## 2016-01-23 NOTE — Progress Notes (Signed)
HPI:  ACUTE VISIT:  Chief Complaint  Patient presents with  . Medication Management    refill on pain med    Jeremy Gill is a 70 y.o. male, who is here today complaining of "terrible" pain, requesting refills on Hydrocodone-Acetaminophen 7.5-325 mg, which he has taken before positive for pain and prescribed by former PCP. He already has an appointment with pain management, second week of September 2017. She denies any side effect of medication, and usually helps some with pain.  He denies any fall in the past year. Smoker, he smokes pipe, he is not interested in quitting.  He denies any use or illicit drugs.  Hx of chronic pain, affecting mainly lower back and lower extremities; also arthralgias of shoulders and ankles, mild cervical and knees.  I saw Jeremy Gill on 11/30/2015, acute visit for lower back pain.  Lumbar spine plain imaging 12/05/15:  1. Mild degenerative changes. 2. Aortic atherosclerosis.  He was referred to orthopedist.  He has history of chronic back pain, currently he is following with orthopedist and ongoing PT. According to patient. The has helped, about 30% improvement in back pain but pain is still "bad", 12/10, sharp, constant but exacerbated by certain activities. Alleviated by rest and local ice. Lower back pain is radiated to both lower extremities, he denies any saddle anesthesia, bowel/urine incontinence.   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 According to his problem list he has history of DM 1, he thinks it is DM 2. He is not longer following with endocrinologist, according to patient, he saw Dr Everardo All and he "didn't get along" with him.  Currently he is on Gabapentin 600 mg 3 times per day.   Lab Results  Component Value Date   HGBA1C 8.5 (H) 12/27/2014    Hx of anxiety, currently he is on Buspirone 15 mg 2 tabs bid.  He is also on amitriptyline 10  mg at bedtime, he has been taking it for about 10 years for insomnia.      Review of Systems  Constitutional: Negative for appetite change, fatigue, fever and unexpected weight change.  HENT: Negative for nosebleeds, sore throat and trouble swallowing.   Eyes: Negative for redness and visual disturbance.  Respiratory: Negative for cough, shortness of breath and wheezing.   Cardiovascular: Positive for leg swelling (stable). Negative for chest pain and palpitations.  Gastrointestinal: Negative for abdominal pain, nausea and vomiting.       No changes in bowel habits.  Genitourinary: Negative for decreased urine volume, dysuria and hematuria.  Musculoskeletal: Positive for arthralgias, back pain, gait problem, neck pain and neck stiffness.  Skin: Negative for rash and wound.  Neurological: Positive for numbness. Negative for seizures, syncope, weakness and headaches.  Psychiatric/Behavioral: Negative for confusion. The patient is nervous/anxious.       Current Outpatient Prescriptions on File Prior to Visit  Medication Sig Dispense Refill  . amitriptyline (ELAVIL) 10 MG tablet TAKE 1 TABLET AT BEDTIME 90 tablet 3  . aspirin 81 MG tablet Take 81 mg by mouth at bedtime. On M and F    . BD INSULIN SYRINGE ULTRAFINE 31G X 5/16" 0.5 ML MISC USE DAILY 100 each 1  . busPIRone (BUSPAR) 15 MG tablet Take 30 mg by mouth 2 (two) times daily.  3  . cloNIDine (CATAPRES) 0.1 MG tablet Take 1 tablet (0.1 mg total) by mouth every evening. (Patient taking differently: Take 0.1 mg by  mouth daily as needed (high blood pressure). ) 90 tablet 3  . fluocinonide cream (LIDEX) 0.05 % Apply topically 3 (three) times daily as needed. For itching 30 g 3  . furosemide (LASIX) 20 MG tablet Take 1 tablet (20 mg total) by mouth every evening. 90 tablet 3  . gabapentin (NEURONTIN) 600 MG tablet Take 600 mg by mouth 3 (three) times daily.  1  . glucose blood test strip One touch verio.  Use up to 4 times a day.  E10.9  400 each 3  . ibuprofen (ADVIL,MOTRIN) 800 MG tablet Take 800 mg by mouth 3 (three) times daily.  1  . insulin aspart (NOVOLOG) 100 UNIT/ML injection Inject 15 Units into the skin 3 (three) times daily before meals. 10 mL 0  . insulin glargine (LANTUS) 100 UNIT/ML injection Inject 0.2 mLs (20 Units total) into the skin at bedtime. 30 mL 5  . insulin lispro (HUMALOG) 100 UNIT/ML injection 16 units before each meal 3 vial 5  . Insulin Syringe-Needle U-100 (BD INSULIN SYRINGE ULTRAFINE) 31G X 5/16" 0.5 ML MISC     . Lancets (ONETOUCH ULTRASOFT) lancets QID Dx 250.01 100 each 12  . lisinopril (PRINIVIL,ZESTRIL) 40 MG tablet Take 1 tablet (40 mg total) by mouth daily. 90 tablet 3  . triamcinolone ointment (KENALOG) 0.1 % Apply topically 2 (two) times daily. 1:1 compound with Eucerin 453.6 g 3   No current facility-administered medications on file prior to visit.      Past Medical History:  Diagnosis Date  . ANXIETY 10/25/2006  . COPD 10/25/2006  . DIABETES MELLITUS, TYPE I 10/25/2006  . 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 HYPERTENSION 10/25/2006  . Polyneuropathy in diabetes(357.2) 04/08/2007  . Psoriasis   . TOBACCO ABUSE 07/08/2007  . VENOUS INSUFFICIENCY 11/25/2007   No Known Allergies  Social History   Social History  . Marital status: Married    Spouse name: N/A  . Number of children: N/A  . Years of education: N/A   Occupational History  . Self Employed    Social History Main Topics  . Smoking status: Current Every Day Smoker    Types: Pipe    Last attempt to quit: 05/29/2011  . Smokeless tobacco: None  . Alcohol use No  . Drug use: No  . Sexual activity: Not Asked   Other Topics Concern  . None   Social History Narrative   Regular exercise-no    Vitals:   01/23/16 1049  BP: 134/72  Pulse: 92  Resp: 12  Temp: 97.8 F (36.6 C)    Body mass index is 29.04 kg/m.     Physical Exam  Nursing note and vitals reviewed. Constitutional: He is oriented to person, place, and  time. He appears well-developed. No distress.  HENT:  Head: Atraumatic.  Mouth/Throat: Oropharynx is clear and moist and mucous membranes are normal. Abnormal dentition (poor dentition).  Eyes: Conjunctivae and EOM are normal.  Cardiovascular: Normal rate and regular rhythm.   Murmur (soft SEM RUSB) heard. Pulses:      Dorsalis pedis pulses are 2+ on the right side, and 2+ on the left side.  Respiratory: Effort normal and breath sounds normal. No respiratory distress.  GI: Soft. He exhibits no mass. There is no hepatomegaly. There is no tenderness.  Musculoskeletal: He exhibits edema (1+ pitting LE edema bilateral).  Shoulders with adequate ROM, elicits pain. Cervical ROM with moderate limitation, no pain elicited. No tenderness upon palpation of paraspinal muscles. Pain elicited with movement on exam  table during examination. Antalgic gait.    Neurological: He is alert and oriented to person, place, and time. Coordination normal.  Reflex Scores:      Patellar reflexes are 1+ on the right side and 1+ on the left side.      Achilles reflexes are 1+ on the right side and 1+ on the left side. SLR elicits pain on lower back, bilateral. Achilles and patellar DTR's symmetric.  Gait unstable when he first gets up, slow, assisted by cane.  Skin: Skin is warm. No erythema.  Psychiatric: He has a normal mood and affect.  Appropriately groomed, good eye contact.      ASSESSMENT AND PLAN:     Fayrene FearingJames was seen today for medication management.  Diagnoses and all orders for this visit:  Diabetic polyneuropathy associated with other specified diabetes mellitus (HCC)  Cymbalta could be a good option for him but we may need to try weaning off Amitriptyline. For now he is not interested in changing current medications. Continue gabapentin 600 mg 3 times per day. Encouraged smoking cessation. Better controlled of DM and foot care. He has an appt in a few days for annual physical/follow up,  so no lab work done today.   -     HYDROcodone-acetaminophen (NORCO) 7.5-325 MG tablet; Take 1 tablet by mouth 3 (three) times daily as needed.  Low back pain with radiation, unspecified laterality  Continue following with orthopedist. Continue with PT. Fall precautions discussed.  -     HYDROcodone-acetaminophen (NORCO) 7.5-325 MG tablet; Take 1 tablet by mouth 3 (three) times daily as needed.  Chronic pain disorder  Prescription for hydrocodone acetaminophen was given, some side effects discussed. Instructed to keep appointment with pain clinic, who will continue managing his chronic pain.  -     HYDROcodone-acetaminophen (NORCO) 7.5-325 MG tablet; Take 1 tablet by mouth 3 (three) times daily as needed.    -Fasting lab work will be ordered including office visit.  Return for Medicare preventive with Ms Susan/keep next OV with me .     -Jeremy.Guy SandiferJames E Sewell advised to return or notify a doctor immediately if symptoms worsen or persist or new concerns arise.       Samuella Rasool G. [place], MD  [facility]. [place] office.

## 2016-01-23 NOTE — Patient Instructions (Addendum)
A few things to remember from today's visit:   Diabetic polyneuropathy associated with other specified diabetes mellitus (HCC) - Plan: HYDROcodone-acetaminophen (NORCO) 7.5-325 MG tablet  Low back pain with radiation, unspecified laterality - Plan: HYDROcodone-acetaminophen (NORCO) 7.5-325 MG tablet  Chronic pain disorder - Plan: HYDROcodone-acetaminophen (NORCO) 7.5-325 MG tablet    Pain is chronic and the goal with medication is to decrease pain to a level when you are able to function, pain will not be zero. Opioid medications have many side effects: constipation, nausea, vomiting,sedation, decreased psychomotor function, urinary retention, addiction among some.     Fall precautions. Continue following with orthopedist, continue physical therapy, and keep appointment with pain clinic.

## 2016-02-02 ENCOUNTER — Ambulatory Visit: Payer: Medicare Other | Admitting: Family Medicine

## 2016-02-06 DIAGNOSIS — M545 Low back pain: Secondary | ICD-10-CM | POA: Diagnosis not present

## 2016-02-08 ENCOUNTER — Ambulatory Visit (INDEPENDENT_AMBULATORY_CARE_PROVIDER_SITE_OTHER): Payer: Medicare Other | Admitting: Family Medicine

## 2016-02-08 ENCOUNTER — Encounter: Payer: Self-pay | Admitting: Family Medicine

## 2016-02-08 VITALS — BP 116/80 | HR 95 | Resp 12 | Ht 71.0 in | Wt 204.5 lb

## 2016-02-08 DIAGNOSIS — Z136 Encounter for screening for cardiovascular disorders: Secondary | ICD-10-CM | POA: Diagnosis not present

## 2016-02-08 DIAGNOSIS — E1142 Type 2 diabetes mellitus with diabetic polyneuropathy: Secondary | ICD-10-CM

## 2016-02-08 DIAGNOSIS — F17200 Nicotine dependence, unspecified, uncomplicated: Secondary | ICD-10-CM

## 2016-02-08 DIAGNOSIS — Z23 Encounter for immunization: Secondary | ICD-10-CM | POA: Diagnosis not present

## 2016-02-08 DIAGNOSIS — F419 Anxiety disorder, unspecified: Secondary | ICD-10-CM

## 2016-02-08 DIAGNOSIS — E1149 Type 2 diabetes mellitus with other diabetic neurological complication: Secondary | ICD-10-CM | POA: Diagnosis not present

## 2016-02-08 DIAGNOSIS — I10 Essential (primary) hypertension: Secondary | ICD-10-CM | POA: Diagnosis not present

## 2016-02-08 LAB — COMPREHENSIVE METABOLIC PANEL
ALBUMIN: 4.3 g/dL (ref 3.5–5.2)
ALT: 16 U/L (ref 0–53)
AST: 16 U/L (ref 0–37)
Alkaline Phosphatase: 37 U/L — ABNORMAL LOW (ref 39–117)
BUN: 19 mg/dL (ref 6–23)
CHLORIDE: 96 meq/L (ref 96–112)
CO2: 32 meq/L (ref 19–32)
Calcium: 9.6 mg/dL (ref 8.4–10.5)
Creatinine, Ser: 1.01 mg/dL (ref 0.40–1.50)
GFR: 77.53 mL/min (ref >60.00)
Glucose, Bld: 250 mg/dL — ABNORMAL HIGH (ref 70–99)
POTASSIUM: 4.8 meq/L (ref 3.5–5.1)
SODIUM: 135 meq/L (ref 135–145)
Total Bilirubin: 0.3 mg/dL (ref 0.2–1.2)
Total Protein: 7.7 g/dL (ref 6.0–8.3)

## 2016-02-08 LAB — LIPID PANEL
CHOL/HDL RATIO: 4
CHOLESTEROL: 199 mg/dL (ref 0–200)
HDL: 51.8 mg/dL (ref >39.00)
LDL CALC: 132 mg/dL — AB (ref 0–99)
NONHDL: 147.03
Triglycerides: 73 mg/dL (ref 0.0–149.0)
VLDL: 14.6 mg/dL (ref 0.0–40.0)

## 2016-02-08 LAB — MICROALBUMIN / CREATININE URINE RATIO
Creatinine,U: 69.5 mg/dL
MICROALB UR: 0.7 mg/dL (ref 0.0–1.9)
Microalb Creat Ratio: 1 mg/g (ref 0.0–30.0)

## 2016-02-08 LAB — HEMOGLOBIN A1C: Hgb A1c MFr Bld: 9.6 % — ABNORMAL HIGH (ref 4.6–6.5)

## 2016-02-08 MED ORDER — LISINOPRIL 40 MG PO TABS: 40.0000 mg | ORAL_TABLET | Freq: Every day | ORAL | 1 refills | Status: DC

## 2016-02-08 MED ORDER — CLONIDINE HCL 0.1 MG PO TABS: 0.1000 mg | ORAL_TABLET | Freq: Every evening | ORAL | 2 refills | Status: DC

## 2016-02-08 MED ORDER — BUSPIRONE HCL 15 MG PO TABS: 30.0000 mg | ORAL_TABLET | Freq: Two times a day (BID) | ORAL | 2 refills | Status: DC

## 2016-02-08 MED ORDER — FUROSEMIDE 20 MG PO TABS: 20.0000 mg | ORAL_TABLET | Freq: Every evening | ORAL | 3 refills | Status: DC

## 2016-02-08 MED ORDER — INSULIN GLARGINE 100 UNIT/ML ~~LOC~~ SOLN: 20.0000 [IU] | Freq: Every day | SUBCUTANEOUS | 5 refills | Status: DC

## 2016-02-08 MED ORDER — AMITRIPTYLINE HCL 10 MG PO TABS
ORAL_TABLET | ORAL | 2 refills | Status: DC
Start: 2016-02-08 — End: 2016-05-31

## 2016-02-08 MED ORDER — INSULIN ASPART 100 UNIT/ML ~~LOC~~ SOLN: 15.0000 [IU] | Freq: Three times a day (TID) | SUBCUTANEOUS | 3 refills | Status: DC

## 2016-02-08 MED ORDER — GABAPENTIN 600 MG PO TABS: 600.0000 mg | ORAL_TABLET | Freq: Three times a day (TID) | ORAL | 1 refills | Status: DC

## 2016-02-08 NOTE — Progress Notes (Signed)
HPI:   Jeremy Gill is a 70 y.o. male, who is here today to follow on some of his chronic medical problems.    He denies any new concern or acute problem.   Diabetes Mellitus II:   Dx with DM II about 16 years ago, listed on problem list DM I, he tells me that it is type 2. He is not longer following with endocrinologists, Dr Everardo AllEllison.  Currently on Lantus 20 U daily and Novolog 15 U before meals tid. Problem has been stable. Checking BS's : FG 120-130's, pre-prandial 150's. Hypoglycemia: He feels "bad" when BS < 100. A month ago he had a BS in the mid 50's, not frequent.  He is tolerating medications well. He denies abdominal pain, nausea, vomiting, polydipsia, polyuria, or polyphagia. + Numbness, tingling, or burning; Hx of peripheral neuropathy. He is on Gabapentin 600 mg tid.  He has not had eye examination in 2 years.   Lab Results  Component Value Date   CREATININE 1.05 12/27/2014   BUN 21 12/27/2014   NA 141 12/27/2014   K 4.4 12/27/2014   CL 102 12/27/2014   CO2 29 12/27/2014    Lab Results  Component Value Date   HGBA1C 8.5 (H) 12/27/2014   Lab Results  Component Value Date   MICROALBUR 1.2 01/13/2014    Hypertension: He does not check his BP at home. Currently he is on Lisinopril 40 mg daily and Clonidine 0.1 mg at bedtime.  He also takes Lasix 20 mg at bedtime, it does not cause urinary frequency or nocturia.  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 He denies any Hx of CKD. No gross hematuria or foam in urine.  Denies severe/frequent headache, visual changes, chest pain, dyspnea, palpitation, claudication, focal weakness, or worsening edema.  Anxiety: Currently he is on Buspar 30 mg bid. He denies any suicidal thoughts. In general he sleeps well, he takes Amitriptyline 10 mg at bedtime, according to pt, it was initially prescribed for insomnia. He has taken Amitriptyline for many years, denies any side  effect.  He lives with wife and mother in low.  + pipe smoker. Hx of COPD, denies cough,wheezing, or dyspnea.    Review of Systems  Constitutional: Negative for appetite change, fatigue, fever and unexpected weight change.  HENT: Negative for mouth sores, nosebleeds, sore throat and trouble swallowing.   Eyes: Negative for pain, redness and visual disturbance.  Respiratory: Negative for cough, shortness of breath and wheezing.   Cardiovascular: Positive for leg swelling (chronic). Negative for chest pain and palpitations.  Gastrointestinal: Negative for abdominal pain, nausea and vomiting.       No changes in bowel habits.  Endocrine: Negative for polydipsia, polyphagia and polyuria.  Genitourinary: Negative for decreased urine volume, difficulty urinating, dysuria, frequency and hematuria.  Musculoskeletal: Positive for arthralgias, back pain, gait problem, neck pain and neck stiffness.  Skin: Negative for rash and wound.  Neurological: Positive for numbness. Negative for seizures, syncope, weakness and headaches.  Psychiatric/Behavioral: Negative for confusion, sleep disturbance and suicidal ideas. The patient is nervous/anxious.       Current Outpatient Prescriptions on File Prior to Visit  Medication Sig Dispense Refill  . aspirin 81 MG tablet Take 81 mg by mouth at bedtime. On M and F    . BD INSULIN SYRINGE ULTRAFINE 31G X 5/16" 0.5 ML MISC USE DAILY 100 each 1  . fluocinonide cream (LIDEX) 0.05 % Apply topically 3 (three)  times daily as needed. For itching 30 g 3  . glucose blood test strip One touch verio.  Use up to 4 times a day.  E10.9 400 each 3  . HYDROcodone-acetaminophen (NORCO) 7.5-325 MG tablet Take 1 tablet by mouth 3 (three) times daily as needed. 90 tablet 0  . ibuprofen (ADVIL,MOTRIN) 800 MG tablet Take 800 mg by mouth 3 (three) times daily.  1  . Insulin Syringe-Needle U-100 (BD INSULIN SYRINGE ULTRAFINE) 31G X 5/16" 0.5 ML MISC     . Lancets (ONETOUCH  ULTRASOFT) lancets QID Dx 250.01 100 each 12  . triamcinolone ointment (KENALOG) 0.1 % Apply topically 2 (two) times daily. 1:1 compound with Eucerin 453.6 g 3   No current facility-administered medications on file prior to visit.      Past Medical History:  Diagnosis Date  . ANXIETY 10/25/2006  . COPD 10/25/2006  . DIABETES MELLITUS, TYPE I 10/25/2006  . 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 HYPERTENSION 10/25/2006  . Polyneuropathy in diabetes(357.2) 04/08/2007  . Psoriasis   . TOBACCO ABUSE 07/08/2007  . VENOUS INSUFFICIENCY 11/25/2007   No Known Allergies  Social History   Social History  . Marital status: Married    Spouse name: N/A  . Number of children: N/A  . Years of education: N/A   Occupational History  . Self Employed    Social History Main Topics  . Smoking status: Current Every Day Smoker    Types: Pipe    Last attempt to quit: 05/29/2011  . Smokeless tobacco: None  . Alcohol use No  . Drug use: No  . Sexual activity: Not Asked   Other Topics Concern  . None   Social History Narrative   Regular exercise-no    Vitals:   02/08/16 0859  BP: 116/80  Pulse: 95  Resp: 12   O2 sat 98% at RA.  Body mass index is 28.52 kg/m.    Physical Exam  Nursing note and vitals reviewed. Constitutional: He is oriented to person, place, and time. He appears well-developed. No distress.  HENT:  Head: Atraumatic.  Mouth/Throat: Oropharynx is clear and moist and mucous membranes are normal. Abnormal dentition.  Hyperpigmentation changes on tongue, ? Hairy tongue   Eyes: Conjunctivae and EOM are normal. Pupils are equal, round, and reactive to light.  Neck: No thyroid mass and no thyromegaly present.  Cardiovascular: Normal rate and regular rhythm.   Murmur (? soft SEM RUSB) heard. Pulses:      Dorsalis pedis pulses are 2+ on the right side, and 2+ on the left side.  Respiratory: Effort normal and breath sounds normal. No respiratory distress.  GI: Soft. He exhibits no  mass. There is no hepatomegaly. There is no tenderness.  Musculoskeletal: He exhibits edema (pitting trace LE edema bilateral).  Lymphadenopathy:    He has no cervical adenopathy.  Neurological: He is alert and oriented to person, place, and time. He has normal strength. Coordination normal.  Assisted gait with cane.  Skin: Skin is warm. No erythema.  Psychiatric: He has a normal mood and affect.  Well groomed, good eye contact.   Diabetic foot exam:  Monofilament decreased bilateral. Peripheral pulses present (DP). No calluses + hypertrophic/long toenails.   ASSESSMENT AND PLAN:     Jeremy Gill was seen today for transfer.  Diagnoses and all orders for this visit:    Diabetes mellitus type 2 with neurological manifestations (HCC)  HgA1C pending. No changes in current management for now,will adjust according to lab results. Regular exercise  and healthy diet with avoidance of added sugar food intake is an important part of treatment and recommended. Education about hypoglycemia provided. Annual eye exam, periodic dental and foot care recommended. F/U in 5-6 months  -     Hemoglobin A1c -     Lipid Panel -     CMP -     Microalbumin/Creatinine Ratio, Urine -     insulin glargine (LANTUS) 100 UNIT/ML injection; Inject 0.2 mLs (20 Units total) into the skin at bedtime. -     insulin aspart (NOVOLOG) 100 UNIT/ML injection; Inject 15 Units into the skin 3 (three) times daily before meals.  Diabetic polyneuropathy associated with type 2 diabetes mellitus (HCC)  Foot care discussed. Continue Gabapentin. Amitriptyline also may help, explained this medication is not usually recommended at his age, risk of side effects; but because he has taken it for many years and tolerating well, he will continue it. Fall precautions.  -     amitriptyline (ELAVIL) 10 MG tablet; TAKE 1 TABLET AT BEDTIME -     gabapentin (NEURONTIN) 600 MG tablet; Take 1 tablet (600 mg total) by mouth 3 (three)  times daily.  Essential hypertension  Adequately controlled, SBP on lower normal range, recommended monitoring BP at home. No changes in current management. Adverse effects of chronic NSAID's use discussed.  DASH diet recommended. Eye exam recommended annually. F/U in 3-4 months, before if needed.  -     Lisinopril (PRINIVIL,ZESTRIL) 40 MG tablet; Take 1 tablet (40 mg total) by mouth daily. -     cloNIDine (CATAPRES) 0.1 MG tablet; Take 1 tablet (0.1 mg total) by mouth every evening. -     furosemide (LASIX) 20 MG tablet; Take 1 tablet (20 mg total) by mouth every evening. -     Lipid Panel -     CMP -     lisinopril (PRINIVIL,ZESTRIL) 40 MG tablet; Take 1 tablet (40 mg total) by mouth daily.  Screening for AAA (abdominal aortic aneurysm)  Adverse effects of tobacco use. He is not interested in cessation, he does not think pipe is as bad as cigarette and states that he does "not inhale it." He agrees with AAA screening.  -     US ABDOMINAL AORTA SCREENING AAA; Future  Need for 23-polyvalent pneumococcal polysaccharide vaccine -     Pneumococcal polysaccharide vaccine 23-valent greater than or equal to 2yo subcutaneous/IM  TOBACCO ABUSE -     US ABDOMINAL AORTA SCREENING AAA; Future  Anxiety disorder, unspecified  Stable. No changes in current management. F/U in 6 months.  -     busPIRone (BUSPAR) 15 MG tablet; Take 2 tablets (30 mg total) by mouth 2 (two) times daily.      -He is supposed to follow with pain clinic in the next few days.    -Jeremy Gill was advised to return sooner than planned today if new concerns arise.       Airiana Elman G. [place], MD  [facility]. [place] office.

## 2016-02-08 NOTE — Progress Notes (Signed)
Pre visit review using our clinic review tool, if applicable. No additional management support is needed unless otherwise documented below in the visit note. 

## 2016-02-08 NOTE — Patient Instructions (Addendum)
A few things to remember from today's visit:   Diabetes mellitus type 2 with neurological manifestations (HCC) - Plan: Hemoglobin A1c, Lipid Panel, CMP, Microalbumin/Creatinine Ratio, Urine, insulin glargine (LANTUS) 100 UNIT/ML injection, insulin aspart (NOVOLOG) 100 UNIT/ML injection  Diabetic polyneuropathy associated with type 2 diabetes mellitus (HCC) - Plan: amitriptyline (ELAVIL) 10 MG tablet, gabapentin (NEURONTIN) 600 MG tablet  Essential hypertension - Plan: cloNIDine (CATAPRES) 0.1 MG tablet, furosemide (LASIX) 20 MG tablet, Lipid Panel, CMP, lisinopril (PRINIVIL,ZESTRIL) 40 MG tablet, DISCONTINUED: lisinopril (PRINIVIL,ZESTRIL) 40 MG tablet  Screening for AAA (abdominal aortic aneurysm)  Type 1 diabetes mellitus without complication (HCC) - Plan: insulin glargine (LANTUS) 100 UNIT/ML injection  Monitor blood pressure at home, we might need to decrease medication if on lower range. HgA1C goal < 7.0. Avoid sugar added food:regular soft drinks, energy drinks, and sports drinks. candy. cakes. cookies. pies and cobblers. sweet rolls, pastries, and donuts. fruit drinks, such as fruitades and fruit punch. dairy desserts, such as ice cream  Mediterranean diet has showed benefits for sugar control.  How much and what type of carbohydrate foods are important for managing diabetes. The balance between how much insulin is in your body and the carbohydrate you eat makes a difference in your blood glucose levels.  Fasting blood sugar ideally 130 or less, 2 hours after meals less than 180.   Regular exercise also will help with controlling disease, daily brisk walking as tolerated for 15-30 min definitively will help.  Fall precautions.  Avoid skipping meals, blood sugar might drop and cause serious problems. Remember checking feet periodically, good dental hygiene, and annual eye exam.     Please be sure medication list is accurate. If a new problem present, please set up appointment  sooner than planned today.

## 2016-02-09 ENCOUNTER — Telehealth: Payer: Self-pay | Admitting: Family Medicine

## 2016-02-09 ENCOUNTER — Other Ambulatory Visit: Payer: Self-pay

## 2016-02-09 DIAGNOSIS — M545 Low back pain: Secondary | ICD-10-CM | POA: Diagnosis not present

## 2016-02-09 DIAGNOSIS — Z136 Encounter for screening for cardiovascular disorders: Secondary | ICD-10-CM

## 2016-02-09 NOTE — Telephone Encounter (Signed)
Kim with [facility] called to advise there is a referral in their workque from  Dr [place] for pt. They only see pregnant women. Please advise.

## 2016-02-09 NOTE — Telephone Encounter (Signed)
I don't see any referrals that would have been sent to [facility] nor any that are pending.

## 2016-02-13 DIAGNOSIS — M545 Low back pain: Secondary | ICD-10-CM | POA: Diagnosis not present

## 2016-02-14 ENCOUNTER — Inpatient Hospital Stay (HOSPITAL_COMMUNITY): Admission: RE | Admit: 2016-02-14 | Payer: Medicare Other | Source: Ambulatory Visit

## 2016-02-14 ENCOUNTER — Other Ambulatory Visit: Payer: Self-pay

## 2016-02-14 DIAGNOSIS — E1149 Type 2 diabetes mellitus with other diabetic neurological complication: Secondary | ICD-10-CM

## 2016-02-14 MED ORDER — ATORVASTATIN CALCIUM 20 MG PO TABS: 20.0000 mg | ORAL_TABLET | Freq: Every day | ORAL | 1 refills | Status: DC

## 2016-02-14 MED ORDER — INSULIN GLARGINE 100 UNIT/ML ~~LOC~~ SOLN: 25.0000 [IU] | Freq: Every day | SUBCUTANEOUS | 1 refills | Status: DC

## 2016-02-15 DIAGNOSIS — M545 Low back pain: Secondary | ICD-10-CM | POA: Diagnosis not present

## 2016-02-16 ENCOUNTER — Encounter: Payer: Medicare Other | Attending: Physical Medicine & Rehabilitation | Admitting: Physical Medicine & Rehabilitation

## 2016-02-16 ENCOUNTER — Encounter: Payer: Self-pay | Admitting: Physical Medicine & Rehabilitation

## 2016-02-16 VITALS — BP 153/81 | HR 91 | Resp 14

## 2016-02-16 DIAGNOSIS — F17200 Nicotine dependence, unspecified, uncomplicated: Secondary | ICD-10-CM | POA: Diagnosis not present

## 2016-02-16 DIAGNOSIS — L409 Psoriasis, unspecified: Secondary | ICD-10-CM | POA: Diagnosis not present

## 2016-02-16 DIAGNOSIS — J449 Chronic obstructive pulmonary disease, unspecified: Secondary | ICD-10-CM | POA: Diagnosis not present

## 2016-02-16 DIAGNOSIS — L301 Dyshidrosis [pompholyx]: Secondary | ICD-10-CM | POA: Insufficient documentation

## 2016-02-16 DIAGNOSIS — Z79899 Other long term (current) drug therapy: Secondary | ICD-10-CM | POA: Diagnosis not present

## 2016-02-16 DIAGNOSIS — Z5181 Encounter for therapeutic drug level monitoring: Secondary | ICD-10-CM

## 2016-02-16 DIAGNOSIS — R269 Unspecified abnormalities of gait and mobility: Secondary | ICD-10-CM | POA: Diagnosis not present

## 2016-02-16 DIAGNOSIS — M545 Low back pain, unspecified: Secondary | ICD-10-CM

## 2016-02-16 DIAGNOSIS — G894 Chronic pain syndrome: Secondary | ICD-10-CM | POA: Diagnosis not present

## 2016-02-16 DIAGNOSIS — G8929 Other chronic pain: Secondary | ICD-10-CM

## 2016-02-16 DIAGNOSIS — I10 Essential (primary) hypertension: Secondary | ICD-10-CM | POA: Diagnosis not present

## 2016-02-16 DIAGNOSIS — I872 Venous insufficiency (chronic) (peripheral): Secondary | ICD-10-CM | POA: Diagnosis not present

## 2016-02-16 DIAGNOSIS — G479 Sleep disorder, unspecified: Secondary | ICD-10-CM | POA: Diagnosis not present

## 2016-02-16 DIAGNOSIS — M1990 Unspecified osteoarthritis, unspecified site: Secondary | ICD-10-CM | POA: Insufficient documentation

## 2016-02-16 DIAGNOSIS — F419 Anxiety disorder, unspecified: Secondary | ICD-10-CM | POA: Diagnosis present

## 2016-02-16 DIAGNOSIS — Z794 Long term (current) use of insulin: Secondary | ICD-10-CM | POA: Diagnosis not present

## 2016-02-16 DIAGNOSIS — E1142 Type 2 diabetes mellitus with diabetic polyneuropathy: Secondary | ICD-10-CM | POA: Diagnosis not present

## 2016-02-16 MED ORDER — GABAPENTIN 600 MG PO TABS: 600.0000 mg | ORAL_TABLET | Freq: Three times a day (TID) | ORAL | 1 refills | Status: DC

## 2016-02-16 MED ORDER — DULOXETINE HCL 30 MG PO CPEP: 30.0000 mg | ORAL_CAPSULE | Freq: Every day | ORAL | 1 refills | Status: DC

## 2016-02-16 MED ORDER — GABAPENTIN 300 MG PO CAPS: 300.0000 mg | ORAL_CAPSULE | Freq: Three times a day (TID) | ORAL | 1 refills | Status: DC

## 2016-02-16 MED ORDER — MELOXICAM 15 MG PO TABS: 15.0000 mg | ORAL_TABLET | Freq: Every day | ORAL | 1 refills | Status: DC

## 2016-02-16 NOTE — Patient Instructions (Signed)
Please request TENS trial from PT

## 2016-02-16 NOTE — Progress Notes (Signed)
Subjective:    Patient ID: Jeremy Gill, male    DOB: [birth year], 70 y.o.   MRN: [id number]  HPI  70 y/o male with pmh of diabetic polyneuropathy, OA, chronic pain, tobacco abuse, HTN, COPD presents with chronic pain >low back, midline.  Started 11/26/15 denies inciting event.  Getting progressively worse.  Pain meds improve the pain. No pain meds exacerbate the pain.  Mostly dull pain. Non-radiating.  Constant.  He saw Ortho spine for evaluation of surgery.  He was referred to PT, which he completed.  It helped a little.  He has not been back to see surgery.  He exercises daily. He tried hydrocodone for 12 years, but it was stopped by her PCP at that time.  He saw Dr. [place], who prescribed 30 day supply of Hydrocodone recently. Denies associated weakness and numbness.  He has fallen twice in the last year because he was not using his cane.  Pain limits pt from doing "normal things".    Pain Inventory Average Pain 9 Pain Right Now 6 My pain is sharp, dull and aching  In the last 24 hours, has pain interfered with the following? General activity 7 Relation with others 5 Enjoyment of life 3 What TIME of day is your pain at its worst? daytime Sleep (in general) Good  Pain is worse with: walking, bending, sitting, standing and some activites Pain improves with: heat/ice, therapy/exercise and medication Relief from Meds: 7  Mobility use a cane ability to climb steps?  no do you drive?  yes  Function retired I need assistance with the following:  dressing  Neuro/Psych weakness numbness tingling trouble walking  Prior Studies Any changes since last visit?  no  Physicians involved in your care Any changes since last visit?  no   Family History  Problem Relation Age of Onset  . Cancer Mother     Unknown   . Diabetes Sister    Social History   Social History  . Marital status: Married    Spouse name: N/A  . Number of children: N/A  . Years of education: N/A    Occupational History  . Self Employed    Social History Main Topics  . Smoking status: Current Every Day Smoker    Types: Pipe    Last attempt to quit: 05/29/2011  . Smokeless tobacco: None  . Alcohol use No  . Drug use: No  . Sexual activity: Not Asked   Other Topics Concern  . None   Social History Narrative   Regular exercise-no   Past Surgical History:  Procedure Laterality Date  . TESTICLE REMOVAL     R testicle   Past Medical History:  Diagnosis Date  . ANXIETY 10/25/2006  . COPD 10/25/2006  . DIABETES MELLITUS, TYPE I 10/25/2006  . 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 HYPERTENSION 10/25/2006  . Polyneuropathy in diabetes(357.2) 04/08/2007  . Psoriasis   . TOBACCO ABUSE 07/08/2007  . VENOUS INSUFFICIENCY 11/25/2007   BP (!) 153/81   Pulse 91   Resp 14   SpO2 97%   Opioid Risk Score:   Fall Risk Score:  `1  Depression screen PHQ 2/9  [facility] screen [facility] 02/16/2016 02/08/2016 01/19/2014  Decreased Interest 2 0 0  Down, Depressed, Hopeless 1 0 0  PHQ - 2 Score 3 0 0  Altered sleeping 1 - -  Tired, decreased energy 2 - -  Change in appetite 1 - -  Feeling bad or failure about yourself  0 - -  Trouble concentrating 0 - -  Moving slowly or fidgety/restless 0 - -  Suicidal thoughts 0 - -  PHQ-9 Score 7 - -  Difficult doing work/chores Somewhat difficult - -    Review of Systems  Constitutional: Negative for chills and fever.  Endocrine:       High blood sugars and low  Skin: Positive for rash.  Neurological: Positive for weakness. Negative for dizziness.  All other systems reviewed and are negative.     Objective:   Physical Exam Gen: NAD. Vital signs reviewed HENT: Normocephalic, Atraumatic Eyes: EOMI, Conj WNL Cardio: S1, S2 normal, RRR Pulm: B/l clear to auscultation.  Effort normal Abd: Soft, non-distended, non-tender, BS+ MSK:  Gait antalgic with cane.   No TTP.    No edema.   FABERs limited due to pain with all ROM.  Neuro: CN II-XII grossly  intact.    Sensation intact to light touch in all LE dermatomes  Reflexes 1+ throughout LE  Strength  4+/5 in all LE myotomes  Seated SLR appears neg, but limited due to pain with ROM Skin: Warm and Dry.     Assessment & Plan:  70 y/o male with pmh of diabetic polyneuropathy, OA, chronic pain, tobacco abuse, HTN, COPD presents with chronic pain >low back, midline.   1.  Chronic mechanical low back pain  Xray L-spine reviewed, mild degenerative changes.   Tylenol ineffective for pt  Pt recently completed PT, possible extension  Cont ice  Will order Mobic 15mg  daily with food  Will increase Gabapentin to 900 TID  Will start Cymbalta 30mg  daily  Pt sees Ortho Surg next week, will await further plan per Ortho  Pt to try TENS with PT, will consider PENS in future  Pt would like to discuss all medications with PCP  He also insists that he knows what works for him and requests the Hydocodone.  Long discussion with patient regarding plan for management of pain with opiods as a last resort.    2. Diabetic polyneuropathy  Gabapentin increased to 900 TID  3. Gait abnormality  Cont cane for safety, pt states good support with current single point cane  Encouraged compliance with diabetic footware  4. Sleep disturbance  Cont Elavil 10 qhs

## 2016-02-20 DIAGNOSIS — M6281 Muscle weakness (generalized): Secondary | ICD-10-CM | POA: Diagnosis not present

## 2016-02-20 DIAGNOSIS — M5136 Other intervertebral disc degeneration, lumbar region: Secondary | ICD-10-CM | POA: Diagnosis not present

## 2016-02-20 DIAGNOSIS — M545 Low back pain: Secondary | ICD-10-CM | POA: Diagnosis not present

## 2016-02-20 DIAGNOSIS — R2689 Other abnormalities of gait and mobility: Secondary | ICD-10-CM | POA: Diagnosis not present

## 2016-02-21 ENCOUNTER — Ambulatory Visit (HOSPITAL_COMMUNITY)
Admission: RE | Admit: 2016-02-21 | Discharge: 2016-02-21 | Disposition: A | Discharge status: Home / Self Care | Payer: Medicare Other | Source: Ambulatory Visit | Attending: Family Medicine | Admitting: Family Medicine

## 2016-02-21 DIAGNOSIS — Z87891 Personal history of nicotine dependence: Secondary | ICD-10-CM | POA: Insufficient documentation

## 2016-02-21 DIAGNOSIS — E119 Type 2 diabetes mellitus without complications: Secondary | ICD-10-CM | POA: Insufficient documentation

## 2016-02-21 DIAGNOSIS — I708 Atherosclerosis of other arteries: Secondary | ICD-10-CM | POA: Diagnosis not present

## 2016-02-21 DIAGNOSIS — Z136 Encounter for screening for cardiovascular disorders: Secondary | ICD-10-CM | POA: Diagnosis not present

## 2016-02-21 DIAGNOSIS — Z832 Family history of diseases of the blood and blood-forming organs and certain disorders involving the immune mechanism: Secondary | ICD-10-CM | POA: Insufficient documentation

## 2016-02-21 DIAGNOSIS — I10 Essential (primary) hypertension: Secondary | ICD-10-CM | POA: Insufficient documentation

## 2016-02-21 DIAGNOSIS — J449 Chronic obstructive pulmonary disease, unspecified: Secondary | ICD-10-CM | POA: Insufficient documentation

## 2016-02-23 LAB — TOXASSURE SELECT,+ANTIDEPR,UR

## 2016-02-27 NOTE — Progress Notes (Signed)
Urine drug screen for this encounter is consistent for prescribed medication 

## 2016-03-21 ENCOUNTER — Encounter: Payer: Medicare Other | Attending: Physical Medicine & Rehabilitation | Admitting: Physical Medicine & Rehabilitation

## 2016-03-21 ENCOUNTER — Encounter: Payer: Self-pay | Admitting: Physical Medicine & Rehabilitation

## 2016-03-21 VITALS — BP 156/90 | HR 100 | Resp 14

## 2016-03-21 DIAGNOSIS — I10 Essential (primary) hypertension: Secondary | ICD-10-CM | POA: Insufficient documentation

## 2016-03-21 DIAGNOSIS — L409 Psoriasis, unspecified: Secondary | ICD-10-CM | POA: Diagnosis not present

## 2016-03-21 DIAGNOSIS — I872 Venous insufficiency (chronic) (peripheral): Secondary | ICD-10-CM | POA: Diagnosis not present

## 2016-03-21 DIAGNOSIS — G894 Chronic pain syndrome: Secondary | ICD-10-CM | POA: Diagnosis not present

## 2016-03-21 DIAGNOSIS — G8929 Other chronic pain: Secondary | ICD-10-CM | POA: Diagnosis not present

## 2016-03-21 DIAGNOSIS — J449 Chronic obstructive pulmonary disease, unspecified: Secondary | ICD-10-CM | POA: Diagnosis not present

## 2016-03-21 DIAGNOSIS — Z794 Long term (current) use of insulin: Secondary | ICD-10-CM | POA: Diagnosis not present

## 2016-03-21 DIAGNOSIS — F17200 Nicotine dependence, unspecified, uncomplicated: Secondary | ICD-10-CM | POA: Diagnosis not present

## 2016-03-21 DIAGNOSIS — L301 Dyshidrosis [pompholyx]: Secondary | ICD-10-CM | POA: Insufficient documentation

## 2016-03-21 DIAGNOSIS — M1990 Unspecified osteoarthritis, unspecified site: Secondary | ICD-10-CM | POA: Diagnosis not present

## 2016-03-21 DIAGNOSIS — G479 Sleep disorder, unspecified: Secondary | ICD-10-CM | POA: Insufficient documentation

## 2016-03-21 DIAGNOSIS — M545 Low back pain: Secondary | ICD-10-CM | POA: Insufficient documentation

## 2016-03-21 DIAGNOSIS — F419 Anxiety disorder, unspecified: Secondary | ICD-10-CM | POA: Diagnosis present

## 2016-03-21 DIAGNOSIS — E1142 Type 2 diabetes mellitus with diabetic polyneuropathy: Secondary | ICD-10-CM | POA: Diagnosis not present

## 2016-03-21 DIAGNOSIS — R269 Unspecified abnormalities of gait and mobility: Secondary | ICD-10-CM | POA: Diagnosis not present

## 2016-03-21 MED ORDER — NAPROXEN 500 MG PO TABS: 500.0000 mg | ORAL_TABLET | Freq: Two times a day (BID) | ORAL | 1 refills | Status: DC

## 2016-03-21 MED ORDER — METHOCARBAMOL 500 MG PO TABS: 500.0000 mg | ORAL_TABLET | Freq: Three times a day (TID) | ORAL | 0 refills | Status: DC | PRN

## 2016-03-21 MED ORDER — TRAMADOL HCL 50 MG PO TABS: 50.0000 mg | ORAL_TABLET | Freq: Three times a day (TID) | ORAL | 0 refills | Status: DC | PRN

## 2016-03-21 NOTE — Progress Notes (Signed)
Subjective:    Patient ID: Jeremy Gill, male    DOB: 1945-06-05, 70 y.o.   MRN: [id number]  HPI  70 y/o male with pmh of diabetic polyneuropathy, OA, chronic pain, tobacco abuse, HTN, COPD presents follow up chronic pain >low back, midline.  Started 11/26/15 denies inciting event.  Getting progressively worse.  Pain meds improve the pain. No pain meds exacerbate the pain.  Mostly dull pain. Non-radiating.  Constant.  He saw Ortho spine for evaluation of surgery.  He was referred to PT, which he completed.  It helped a little.  He has not been back to see surgery.  He exercises daily. He tried hydrocodone for 12 years, but it was stopped by her PCP.  Denies associated weakness and numbness.  He has fallen twice in the last year because he was not using his cane.  Pain limits pt from doing "normal things".    Last clinic visit 02/16/16.  Since that time, he has completed PT.  He saw Ortho, who released him.  Continues to use ice.  Mobic did not help.  He believes the Cymbalta is helping.  There was some confusion about dosing of Gabapentin.  Pt did not try TENS with PT.  He continues to take Elavil with good benefit.    Pain Inventory Average Pain 7 Pain Right Now 7 My pain is constant, sharp, burning, dull, stabbing, tingling and aching  In the last 24 hours, has pain interfered with the following? General activity 7 Relation with others 5 Enjoyment of life 4 What TIME of day is your pain at its worst? daytime Sleep (in general) Good  Pain is worse with: walking, bending, sitting, standing and some activites Pain improves with: heat/ice and therapy/exercise Relief from Meds: 2  Mobility walk with assistance use a cane how many minutes can you walk? 30 ability to climb steps?  yes do you drive?  yes  Function retired  Neuro/Psych numbness tingling trouble walking depression  Prior Studies Any changes since last visit?  no  Physicians involved in your care Any changes  since last visit?  no   Family History  Problem Relation Age of Onset  . Cancer Mother     Unknown   . Diabetes Sister    Social History   Social History  . Marital status: Married    Spouse name: N/A  . Number of children: N/A  . Years of education: N/A   Occupational History  . Self Employed    Social History Main Topics  . Smoking status: Current Every Day Smoker    Types: Pipe    Last attempt to quit: 05/29/2011  . Smokeless tobacco: Never Used  . Alcohol use No  . Drug use: No  . Sexual activity: Not Asked   Other Topics Concern  . None   Social History Narrative   Regular exercise-no   Past Surgical History:  Procedure Laterality Date  . TESTICLE REMOVAL     R testicle   Past Medical History:  Diagnosis Date  . ANXIETY 10/25/2006  . COPD 10/25/2006  . DIABETES MELLITUS, TYPE I 10/25/2006  . 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 HYPERTENSION 10/25/2006  . Polyneuropathy in diabetes(357.2) 04/08/2007  . Psoriasis   . TOBACCO ABUSE 07/08/2007  . VENOUS INSUFFICIENCY 11/25/2007   BP (!) 156/90 (BP Location: Left Arm, Patient Position: Sitting, Cuff Size: Large)   Pulse 100   Resp 14   SpO2 98%   Opioid Risk Score:   Fall  Risk Score:  `1  Depression screen PHQ 2/9  Dep[facility] screen [facility] 02/16/2016 02/08/2016 01/19/2014  Decreased Interest 2 0 0  Down, Depressed, Hopeless 1 0 0  PHQ - 2 Score 3 0 0  Altered sleeping 1 - -  Tired, decreased energy 2 - -  Change in appetite 1 - -  Feeling bad or failure about yourself  0 - -  Trouble concentrating 0 - -  Moving slowly or fidgety/restless 0 - -  Suicidal thoughts 0 - -  PHQ-9 Score 7 - -  Difficult doing work/chores Somewhat difficult - -    Review of Systems  Constitutional: Negative.  Negative for chills and fever.  HENT: Negative.   Eyes: Negative.   Cardiovascular: Positive for leg swelling.  Gastrointestinal: Positive for diarrhea.  Endocrine: Negative.        High blood sugar Low blood sugar     Genitourinary: Positive for dysuria.  Musculoskeletal: Positive for arthralgias, back pain and gait problem.  Skin: Positive for rash.  Allergic/Immunologic: Negative.   Neurological: Positive for numbness. Negative for dizziness.       Tingling   Hematological: Negative.   Psychiatric/Behavioral: Positive for dysphoric mood.  All other systems reviewed and are negative.     Objective:   Physical Exam Gen: NAD. Vital signs reviewed HENT: Normocephalic, Atraumatic Eyes: EOMI, Conj WNL Cardio: S1, S2 normal, RRR Pulm: B/l clear to auscultation.  Effort normal Abd: Soft, non-distended, non-tender, BS+ MSK:  Gait antalgic with cane.   No TTP.    No edema.   FABERs limited due to pain with ROM, but appears to Neg.  Neuro: CN II-XII grossly intact.    Sensation intact to light touch in all LE dermatomes  Reflexes 1+ throughout LE  Strength  4+/5 in all LE myotomes  Seated SLR neg Skin: Warm and Dry.     Assessment & Plan:  70 y/o male with pmh of diabetic polyneuropathy, OA, chronic pain, tobacco abuse, HTN, COPD presents with chronic pain >low back, midline.   1.  Chronic mechanical low back pain  Xray L-spine reviewed, mild degenerative changes.   Tylenol ineffective for pt  Pt released by Ortho  Pt recently completed PT, cont HEP  Cont ice  Mobic 15mg  d/ced due to lack of efficacy  Pt to now increase Gabapentin to 900 TID  Will increase Cymbalta to 60mg  daily  Encouraged pt to try TENS OTC, now that pt has completed  Pt would like to discuss all medications with PCP  He also insists that he knows what works for him and requests the Hydocodone (was on 7.5 TID), brings articles for me to ready.  Long discussion with patient regarding plan for management of pain with opiods as a last resort.    Will order Tramadol 50 TID PRN  Will order Robaxin TID PRN  Will order Naproxen 500 BID  2. Diabetic polyneuropathy  Gabapentin increased to 900 TID  3. Gait abnormality  Cont  cane for safety, pt states good support with current single point cane  Encouraged compliance with diabetic footware  4. Sleep disturbance  Cont Elavil 10 qhs

## 2016-04-27 ENCOUNTER — Ambulatory Visit: Payer: Medicare Other | Admitting: Physical Medicine & Rehabilitation

## 2016-04-27 ENCOUNTER — Encounter: Payer: Medicare Other | Admitting: Physical Medicine & Rehabilitation

## 2016-04-30 ENCOUNTER — Telehealth: Payer: Self-pay | Admitting: Family Medicine

## 2016-04-30 NOTE — Telephone Encounter (Signed)
Called and spoke with patient. Relayed the message to patient and advised that he contact the pain clinic and have them send a message to the provider. Patient verbalized understanding.

## 2016-04-30 NOTE — Telephone Encounter (Signed)
Called and spoke with patient. He had to cancel his pain management appointment on Friday because he couldn't walk. The next appointment available is not until Dec 21st, which he is scheduled for. Patient is wanting to know if you can fill the Tramadol to hold him until his appointment?

## 2016-04-30 NOTE — Telephone Encounter (Signed)
I am sorry but he is already following with pain management and if he gets a prescription from another provider he might be discharged from pain management. This is a controlled medication,  it needs to be prescribed by same provider when taken chronically for pain management.   Thanks, BJ

## 2016-04-30 NOTE — Telephone Encounter (Signed)
Pt would like a call back from Sarah. Declined to provide any other information.

## 2016-04-30 NOTE — Telephone Encounter (Signed)
Tried calling patient, will call back later.

## 2016-05-04 ENCOUNTER — Encounter: Payer: Medicare Other | Attending: Physical Medicine & Rehabilitation | Admitting: Physical Medicine & Rehabilitation

## 2016-05-04 ENCOUNTER — Encounter: Payer: Self-pay | Admitting: Physical Medicine & Rehabilitation

## 2016-05-04 VITALS — BP 137/83 | HR 87

## 2016-05-04 DIAGNOSIS — Z794 Long term (current) use of insulin: Secondary | ICD-10-CM | POA: Diagnosis not present

## 2016-05-04 DIAGNOSIS — M545 Low back pain: Secondary | ICD-10-CM | POA: Insufficient documentation

## 2016-05-04 DIAGNOSIS — E1142 Type 2 diabetes mellitus with diabetic polyneuropathy: Secondary | ICD-10-CM

## 2016-05-04 DIAGNOSIS — I10 Essential (primary) hypertension: Secondary | ICD-10-CM | POA: Diagnosis not present

## 2016-05-04 DIAGNOSIS — L409 Psoriasis, unspecified: Secondary | ICD-10-CM | POA: Diagnosis not present

## 2016-05-04 DIAGNOSIS — M1990 Unspecified osteoarthritis, unspecified site: Secondary | ICD-10-CM | POA: Insufficient documentation

## 2016-05-04 DIAGNOSIS — G894 Chronic pain syndrome: Secondary | ICD-10-CM | POA: Diagnosis not present

## 2016-05-04 DIAGNOSIS — L301 Dyshidrosis [pompholyx]: Secondary | ICD-10-CM | POA: Insufficient documentation

## 2016-05-04 DIAGNOSIS — R269 Unspecified abnormalities of gait and mobility: Secondary | ICD-10-CM | POA: Diagnosis not present

## 2016-05-04 DIAGNOSIS — G479 Sleep disorder, unspecified: Secondary | ICD-10-CM | POA: Diagnosis not present

## 2016-05-04 DIAGNOSIS — I872 Venous insufficiency (chronic) (peripheral): Secondary | ICD-10-CM | POA: Insufficient documentation

## 2016-05-04 DIAGNOSIS — F17200 Nicotine dependence, unspecified, uncomplicated: Secondary | ICD-10-CM | POA: Insufficient documentation

## 2016-05-04 DIAGNOSIS — G8929 Other chronic pain: Secondary | ICD-10-CM | POA: Diagnosis not present

## 2016-05-04 DIAGNOSIS — F419 Anxiety disorder, unspecified: Secondary | ICD-10-CM | POA: Diagnosis present

## 2016-05-04 DIAGNOSIS — J449 Chronic obstructive pulmonary disease, unspecified: Secondary | ICD-10-CM | POA: Insufficient documentation

## 2016-05-04 MED ORDER — DULOXETINE HCL 60 MG PO CPEP: 60.0000 mg | ORAL_CAPSULE | Freq: Every day | ORAL | 1 refills | Status: DC

## 2016-05-04 MED ORDER — GABAPENTIN 300 MG PO CAPS: 900.0000 mg | ORAL_CAPSULE | Freq: Three times a day (TID) | ORAL | 1 refills | Status: DC

## 2016-05-04 MED ORDER — METHOCARBAMOL 500 MG PO TABS: 250.0000 mg | ORAL_TABLET | Freq: Three times a day (TID) | ORAL | 1 refills | Status: DC | PRN

## 2016-05-04 MED ORDER — TRAMADOL HCL 50 MG PO TABS: 50.0000 mg | ORAL_TABLET | Freq: Three times a day (TID) | ORAL | 0 refills | Status: DC | PRN

## 2016-05-04 MED ORDER — NAPROXEN 500 MG PO TABS: 500.0000 mg | ORAL_TABLET | Freq: Two times a day (BID) | ORAL | 1 refills | Status: DC

## 2016-05-04 NOTE — Progress Notes (Signed)
Subjective:    Patient ID: Jeremy SalinesJames E Dettloff, male    DOB: Dec 19, 1945, 70 y.o.   MRN: [id number]  HPI  70 y/o male with pmh of diabetic polyneuropathy, OA, chronic pain, tobacco abuse, HTN, COPD presents follow up chronic pain >low back, midline.  Started 11/26/15 denies inciting event.  Getting progressively worse.  Pain meds improve the pain. No pain meds exacerbate the pain.  Mostly dull pain. Non-radiating.  Constant.  He saw Ortho spine for evaluation of surgery.  He was referred to PT, which he completed.  It helped a little.  He exercises daily. He tried hydrocodone for 12 years, but it was stopped by her PCP.  Denies associated weakness and numbness.  He has fallen twice in the last year because he was not using his cane.  Pain limits pt from doing "normal things".    Last clinic visit 03/21/16.  Since that time, he was cleaning the bathroom 3 weeks and fell standing up after squatting.  Pt did not increase his Gabapentin.  He thought he was supposed to d/c his Cymbalta. He never tried the TENS unit.  He tries OTC lidoderm patch, which helps.  He would like to decrease Robaxin, increase Tramadol.  He likes the Naproxen.  He continues to take Elavil with benefit.    Pain Inventory Average Pain 6 Pain Right Now 6 My pain is sharp, stabbing and aching  In the last 24 hours, has pain interfered with the following? General activity 6 Relation with others 1 Enjoyment of life 3 What TIME of day is your pain at its worst? daytime Sleep (in general) Good  Pain is worse with: walking, bending, sitting, standing and some activites Pain improves with: rest, therapy/exercise and medication Relief from Meds: 6  Mobility use a cane how many minutes can you walk? 10 ability to climb steps?  yes do you drive?  yes  Function retired  Neuro/Psych tremor tingling trouble walking spasms  Prior Studies Any changes since last visit?  no   Family History  Problem Relation Age of Onset  .  Cancer Mother     Unknown   . Diabetes Sister    Social History   Social History  . Marital status: Married    Spouse name: N/A  . Number of children: N/A  . Years of education: N/A   Occupational History  . Self Employed    Social History Main Topics  . Smoking status: Current Every Day Smoker    Types: Pipe    Last attempt to quit: 05/29/2011  . Smokeless tobacco: Never Used  . Alcohol use No  . Drug use: No  . Sexual activity: Not Asked   Other Topics Concern  . None   Social History Narrative   Regular exercise-no   Past Surgical History:  Procedure Laterality Date  . TESTICLE REMOVAL     R testicle   Past Medical History:  Diagnosis Date  . ANXIETY 10/25/2006  . COPD 10/25/2006  . DIABETES MELLITUS, TYPE I 10/25/2006  . 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 HYPERTENSION 10/25/2006  . Polyneuropathy in diabetes(357.2) 04/08/2007  . Psoriasis   . TOBACCO ABUSE 07/08/2007  . VENOUS INSUFFICIENCY 11/25/2007   BP 137/83 (BP Location: Right Arm, Patient Position: Sitting, Cuff Size: Normal)   Pulse 87   SpO2 96%   Opioid Risk Score:   Fall Risk Score:  `1  Depression screen PHQ 2/9  [facility] 02/16/2016 02/08/2016 01/19/2014  Decreased Interest 2  0 0  Down, Depressed, Hopeless 1 0 0  PHQ - 2 Score 3 0 0  Altered sleeping 1 - -  Tired, decreased energy 2 - -  Change in appetite 1 - -  Feeling bad or failure about yourself  0 - -  Trouble concentrating 0 - -  Moving slowly or fidgety/restless 0 - -  Suicidal thoughts 0 - -  PHQ-9 Score 7 - -  Difficult doing work/chores Somewhat difficult - -    Review of Systems  Constitutional: Negative.  Negative for chills and fever.  HENT: Negative.   Eyes: Negative.   Cardiovascular: Positive for leg swelling.  Gastrointestinal: Positive for diarrhea.  Endocrine: Negative.        High blood sugar Low blood sugar   Genitourinary: Positive for dysuria.  Musculoskeletal: Positive for arthralgias, back pain and  gait problem.  Skin: Positive for rash.  Allergic/Immunologic: Negative.   Neurological: Positive for tremors and numbness. Negative for dizziness.       Tingling   Hematological: Negative.   Psychiatric/Behavioral: Positive for dysphoric mood.  All other systems reviewed and are negative.     Objective:   Physical Exam Gen: NAD. Vital signs reviewed HENT: Normocephalic, Atraumatic Eyes: EOMI, Conj WNL Cardio: S1, S2 normal, RRR Pulm: B/l clear to auscultation.  Effort normal Abd: Soft, non-distended, non-tender, BS+ MSK:  Gait antalgic with cane.   No TTP.    No edema.   Neg FABERs.  Neuro:  Sensation intact to light touch in all LE dermatomes  Reflexes 1+ throughout LE  Strength  4+-5/5 in all LE myotomes  Seated SLR neg Skin: Warm and Dry.     Assessment & Plan:  70 y/o male with pmh of diabetic polyneuropathy, OA, chronic pain, tobacco abuse, HTN, COPD presents with chronic pain >low back, midline.   1.  Chronic mechanical low back pain  Xray L-spine reviewed, mild degenerative changes.   Tylenol ineffective for pt  Pt released by Ortho  Pt recently completed PT, cont HEP  Cont ice  Mobic 15mg  d/ced due to lack of efficacy  Will increase Gabapentin to 900 TID  Will increase Cymbalta to 60mg  daily after 30mg  for 2 weeks  Will order TENS  Pt would like to discuss all medications with PCP  He also insists that he knows what works for him and requests the Hydocodone (was on 7.5 TID), has brought articles for me to read.  Long discussion with patient regarding plan for management of pain with opiods as a last resort previously  Will order Tramadol 50 TID PRN (educated on signs/symptoms serotonin syndrome)  Will order Robaxin TID PRN  Will order Naproxen 500 BID  Cont Lidoderm  2. Diabetic polyneuropathy  Gabapentin increased to 900 TID  3. Gait abnormality  Cont cane for safety, pt states good support with current single point cane  Encouraged compliance with  diabetic footware  4. Sleep disturbance  Cont Elavil 10 qhs

## 2016-05-14 ENCOUNTER — Telehealth: Payer: Self-pay

## 2016-05-14 NOTE — Telephone Encounter (Signed)
Called and left voicemail letting patient know that handicap placard is ready for pick up at the front desk.

## 2016-05-17 ENCOUNTER — Ambulatory Visit: Payer: Medicare Other | Admitting: Physical Medicine & Rehabilitation

## 2016-05-31 ENCOUNTER — Encounter: Payer: PPO | Attending: Physical Medicine & Rehabilitation | Admitting: Physical Medicine & Rehabilitation

## 2016-05-31 ENCOUNTER — Encounter: Payer: Self-pay | Admitting: Physical Medicine & Rehabilitation

## 2016-05-31 VITALS — BP 174/92 | HR 101 | Resp 14

## 2016-05-31 DIAGNOSIS — L409 Psoriasis, unspecified: Secondary | ICD-10-CM | POA: Insufficient documentation

## 2016-05-31 DIAGNOSIS — G894 Chronic pain syndrome: Secondary | ICD-10-CM

## 2016-05-31 DIAGNOSIS — F419 Anxiety disorder, unspecified: Secondary | ICD-10-CM | POA: Diagnosis not present

## 2016-05-31 DIAGNOSIS — G479 Sleep disorder, unspecified: Secondary | ICD-10-CM | POA: Insufficient documentation

## 2016-05-31 DIAGNOSIS — G8929 Other chronic pain: Secondary | ICD-10-CM

## 2016-05-31 DIAGNOSIS — E1142 Type 2 diabetes mellitus with diabetic polyneuropathy: Secondary | ICD-10-CM

## 2016-05-31 DIAGNOSIS — R269 Unspecified abnormalities of gait and mobility: Secondary | ICD-10-CM | POA: Insufficient documentation

## 2016-05-31 DIAGNOSIS — I10 Essential (primary) hypertension: Secondary | ICD-10-CM | POA: Diagnosis not present

## 2016-05-31 DIAGNOSIS — M545 Low back pain, unspecified: Secondary | ICD-10-CM

## 2016-05-31 DIAGNOSIS — M1990 Unspecified osteoarthritis, unspecified site: Secondary | ICD-10-CM | POA: Diagnosis not present

## 2016-05-31 DIAGNOSIS — Z794 Long term (current) use of insulin: Secondary | ICD-10-CM | POA: Diagnosis not present

## 2016-05-31 DIAGNOSIS — L301 Dyshidrosis [pompholyx]: Secondary | ICD-10-CM | POA: Diagnosis not present

## 2016-05-31 DIAGNOSIS — F17200 Nicotine dependence, unspecified, uncomplicated: Secondary | ICD-10-CM | POA: Insufficient documentation

## 2016-05-31 DIAGNOSIS — I872 Venous insufficiency (chronic) (peripheral): Secondary | ICD-10-CM | POA: Insufficient documentation

## 2016-05-31 DIAGNOSIS — J449 Chronic obstructive pulmonary disease, unspecified: Secondary | ICD-10-CM | POA: Insufficient documentation

## 2016-05-31 MED ORDER — AMITRIPTYLINE HCL 25 MG PO TABS: 25.0000 mg | ORAL_TABLET | Freq: Every day | ORAL | 1 refills | Status: DC

## 2016-05-31 MED ORDER — DULOXETINE HCL 60 MG PO CPEP: 60.0000 mg | ORAL_CAPSULE | Freq: Every day | ORAL | 1 refills | Status: DC

## 2016-05-31 MED ORDER — NAPROXEN 500 MG PO TABS: 500.0000 mg | ORAL_TABLET | Freq: Two times a day (BID) | ORAL | 1 refills | Status: DC

## 2016-05-31 MED ORDER — TRAMADOL HCL 50 MG PO TABS: 50.0000 mg | ORAL_TABLET | Freq: Four times a day (QID) | ORAL | 1 refills | Status: DC | PRN

## 2016-05-31 NOTE — Progress Notes (Signed)
Subjective:    Patient ID: Jeremy Gill, male    DOB: 10-Dec-1945, 71 y.o.   MRN: [id number]  HPI  71 y/o male with pmh of diabetic polyneuropathy, OA, chronic pain, tobacco abuse, HTN, COPD presents follow up chronic pain >low back, midline.  Started 11/26/15 denies inciting event.  Getting progressively worse.  Pain meds improve the pain. No pain meds alleviate the pain.  Mostly dull pain. Non-radiating.  Constant.  He saw Ortho spine for evaluation of surgery.  He was referred to PT, which he completed.  It helped a little.  He exercises daily. He tried hydrocodone for 12 years, but it was stopped by her PCP.  Denies associated weakness and numbness.  He has fallen twice in the year prior to eval because he was not using his cane.  Pain limits pt from doing "normal things".    Last clinic visit 05/04/16.  Denies falls since last visit.  He continues to do HEP.  The increase in Gabapentin helped him.  The increase in Cymbalta helped as well.  He did not receive the TENS unit because he changed insurance companies.  Pt states he took 100mg  Tramadol 4/day for 2 days, which helped a lot.  The Robaxin is helping.  The Naproxen is helping as well.  He is not using Lidoderm patches at present.  He still uses Elavil, which helps.    Pain Inventory Average Pain 5 Pain Right Now 5 My pain is sharp, burning and dull  In the last 24 hours, has pain interfered with the following? General activity 5 Relation with others 2 Enjoyment of life 4 What TIME of day is your pain at its worst? daytime Sleep (in general) Good  Pain is worse with: walking, bending, sitting, standing and some activites Pain improves with: rest, therapy/exercise and medication Relief from Meds: 5  Mobility walk with assistance use a cane ability to climb steps?  yes do you drive?  yes  Function retired  Neuro/Psych trouble walking spasms  Prior Studies Any changes since last visit?  no   Family History  Problem  Relation Age of Onset  . Cancer Mother     Unknown   . Diabetes Sister    Social History   Social History  . Marital status: Married    Spouse name: N/A  . Number of children: N/A  . Years of education: N/A   Occupational History  . Self Employed    Social History Main Topics  . Smoking status: Current Every Day Smoker    Types: Pipe    Last attempt to quit: 05/29/2011  . Smokeless tobacco: Never Used  . Alcohol use No  . Drug use: No  . Sexual activity: Not Asked   Other Topics Concern  . None   Social History Narrative   Regular exercise-no   Past Surgical History:  Procedure Laterality Date  . TESTICLE REMOVAL     R testicle   Past Medical History:  Diagnosis Date  . ANXIETY 10/25/2006  . COPD 10/25/2006  . DIABETES MELLITUS, TYPE I 10/25/2006  . 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 HYPERTENSION 10/25/2006  . Polyneuropathy in diabetes(357.2) 04/08/2007  . Psoriasis   . TOBACCO ABUSE 07/08/2007  . VENOUS INSUFFICIENCY 11/25/2007   BP (!) 174/92   Pulse (!) 101   Resp 14   SpO2 95%   Opioid Risk Score:   Fall Risk Score:  `1  Depression screen PHQ 2/9  Depression screen [facility] 2/9 02/16/2016 02/08/2016  01/19/2014  Decreased Interest 2 0 0  Down, Depressed, Hopeless 1 0 0  PHQ - 2 Score 3 0 0  Altered sleeping 1 - -  Tired, decreased energy 2 - -  Change in appetite 1 - -  Feeling bad or failure about yourself  0 - -  Trouble concentrating 0 - -  Moving slowly or fidgety/restless 0 - -  Suicidal thoughts 0 - -  PHQ-9 Score 7 - -  Difficult doing work/chores Somewhat difficult - -    Review of Systems  Constitutional: Negative.  Negative for chills and fever.  HENT: Negative.   Eyes: Negative.   Respiratory: Negative.   Cardiovascular: Positive for leg swelling.  Gastrointestinal: Positive for constipation.  Endocrine:       High blood sugar    Genitourinary: Negative.   Musculoskeletal: Positive for arthralgias, back pain and gait problem.       Spasms     Skin: Negative.   Allergic/Immunologic: Negative.   Neurological: Negative for dizziness.          Hematological: Negative.   Psychiatric/Behavioral: Negative.   All other systems reviewed and are negative.     Objective:   Physical Exam Gen: NAD. Vital signs reviewed HENT: Normocephalic, Atraumatic Eyes: EOMI. No discharge.  Cardio: RRR. No JVD.  Pulm: B/l clear to auscultation.  Effort normal Abd: Soft, BS+ MSK:  Gait antalgic with cane.   No TTP.    No edema.   Neg FABERs.  Neuro:  Sensation intact to light touch in all LE dermatomes, subjective slightly decreased at ankles  Reflexes 1+ throughout LE  Strength  4+-5/5 in all LE myotomes Skin: Warm and Dry.  Intact.    Assessment & Plan:  71 y/o male with pmh of diabetic polyneuropathy, OA, chronic pain, tobacco abuse, HTN, COPD presents for follow up of chronic pain >low back, midline.   1.  Chronic mechanical low back pain  Xray L-spine reviewed, mild degenerative changes.   Tylenol ineffective for pt  Pt released by Ortho  Pt recently completed PT, cont HEP  Mobic 15mg  d/ced due to lack of efficacy  Cont ice  Cont Gabapentin to 900 TID  Cont Cymbalta to 60mg    TENS, pt to follow up  Pt would like to discuss all medications with PCP  He also insists that he knows what works for him and requests the Hydocodone (was on 7.5 TID), has brought articles for me to read.  Long discussion with patient regarding plan for management of pain with opiods as a last resort previously. He is more amenable to change now.  Will increase Tramadol 50 to 4/day (educated and discussed signs/symptoms serotonin syndrome and risk of seizures)  Cont Robaxin 250 BID PRN  Cont Naproxen 500 BID, labs reviewed and ordered  Cont Lidoderm PRN  Elavil increased to 25qhs  2. Diabetic polyneuropathy  Cont Gabapentin 900 TID  3. Gait abnormality  Cont cane for safety, pt states good support with current single point cane  Encouraged compliance  with diabetic footware  4. Sleep disturbance  Will increase Elavil to 25 qhs

## 2016-06-14 ENCOUNTER — Other Ambulatory Visit: Payer: Self-pay | Admitting: Physical Medicine & Rehabilitation

## 2016-06-29 ENCOUNTER — Telehealth: Payer: Self-pay | Admitting: *Deleted

## 2016-06-29 ENCOUNTER — Other Ambulatory Visit: Payer: Self-pay | Admitting: Physical Medicine & Rehabilitation

## 2016-06-29 DIAGNOSIS — G894 Chronic pain syndrome: Secondary | ICD-10-CM

## 2016-06-29 NOTE — Telephone Encounter (Signed)
I believe I requested to have his BMP or bring recent labs from PCP, if he has had them.  Can we give script for BMP if he has not anything from his PCP. Thanks.

## 2016-06-29 NOTE — Telephone Encounter (Signed)
Mr Jeremy Gill came by this am and has called back to the office this afternoon inquiring about some blood work needing to be drawn. I do not see any note in Dr Eliane DecreePatel's dictation mentioning any labwork.  They feel he intended for them to go to [facility] and have some drawn.  Please advise.

## 2016-07-02 NOTE — Telephone Encounter (Signed)
Thanks

## 2016-07-03 ENCOUNTER — Other Ambulatory Visit: Payer: Self-pay | Admitting: Physical Medicine & Rehabilitation

## 2016-07-05 DIAGNOSIS — G894 Chronic pain syndrome: Secondary | ICD-10-CM | POA: Diagnosis not present

## 2016-07-05 LAB — BASIC METABOLIC PANEL
BUN / CREAT RATIO: 26 — AB (ref 10–24)
BUN: 24 mg/dL (ref 8–27)
CHLORIDE: 94 mmol/L — AB (ref 96–106)
CO2: 31 mmol/L — ABNORMAL HIGH (ref 18–29)
CREATININE: 0.92 mg/dL (ref 0.76–1.27)
Calcium: 9.7 mg/dL (ref 8.6–10.2)
GFR calc non Af Amer: 84 mL/min/{1.73_m2} (ref >59)
GFR, EST AFRICAN AMERICAN: 97 mL/min/{1.73_m2} (ref >59)
GLUCOSE: 338 mg/dL — AB (ref 65–99)
Potassium: 4.6 mmol/L (ref 3.5–5.2)
SODIUM: 132 mmol/L — AB (ref 134–144)

## 2016-07-12 ENCOUNTER — Encounter: Payer: PPO | Attending: Physical Medicine & Rehabilitation | Admitting: Physical Medicine & Rehabilitation

## 2016-07-12 ENCOUNTER — Encounter: Payer: Self-pay | Admitting: Physical Medicine & Rehabilitation

## 2016-07-12 VITALS — BP 110/65 | HR 109 | Resp 14

## 2016-07-12 DIAGNOSIS — E1142 Type 2 diabetes mellitus with diabetic polyneuropathy: Secondary | ICD-10-CM | POA: Diagnosis not present

## 2016-07-12 DIAGNOSIS — G479 Sleep disorder, unspecified: Secondary | ICD-10-CM | POA: Insufficient documentation

## 2016-07-12 DIAGNOSIS — M1990 Unspecified osteoarthritis, unspecified site: Secondary | ICD-10-CM | POA: Insufficient documentation

## 2016-07-12 DIAGNOSIS — I872 Venous insufficiency (chronic) (peripheral): Secondary | ICD-10-CM | POA: Insufficient documentation

## 2016-07-12 DIAGNOSIS — I10 Essential (primary) hypertension: Secondary | ICD-10-CM | POA: Insufficient documentation

## 2016-07-12 DIAGNOSIS — M545 Low back pain, unspecified: Secondary | ICD-10-CM

## 2016-07-12 DIAGNOSIS — J449 Chronic obstructive pulmonary disease, unspecified: Secondary | ICD-10-CM | POA: Insufficient documentation

## 2016-07-12 DIAGNOSIS — L409 Psoriasis, unspecified: Secondary | ICD-10-CM | POA: Diagnosis not present

## 2016-07-12 DIAGNOSIS — R269 Unspecified abnormalities of gait and mobility: Secondary | ICD-10-CM

## 2016-07-12 DIAGNOSIS — F17200 Nicotine dependence, unspecified, uncomplicated: Secondary | ICD-10-CM | POA: Diagnosis not present

## 2016-07-12 DIAGNOSIS — F419 Anxiety disorder, unspecified: Secondary | ICD-10-CM | POA: Diagnosis not present

## 2016-07-12 DIAGNOSIS — L301 Dyshidrosis [pompholyx]: Secondary | ICD-10-CM | POA: Insufficient documentation

## 2016-07-12 DIAGNOSIS — Z794 Long term (current) use of insulin: Secondary | ICD-10-CM | POA: Insufficient documentation

## 2016-07-12 DIAGNOSIS — G8929 Other chronic pain: Secondary | ICD-10-CM | POA: Insufficient documentation

## 2016-07-12 DIAGNOSIS — G894 Chronic pain syndrome: Secondary | ICD-10-CM | POA: Diagnosis not present

## 2016-07-12 MED ORDER — TRAMADOL HCL 50 MG PO TABS: 50.0000 mg | ORAL_TABLET | Freq: Four times a day (QID) | ORAL | 1 refills | Status: DC | PRN

## 2016-07-12 MED ORDER — AMITRIPTYLINE HCL 10 MG PO TABS: 10.0000 mg | ORAL_TABLET | Freq: Every day | ORAL | 1 refills | Status: DC

## 2016-07-12 NOTE — Progress Notes (Signed)
Subjective:    Patient ID: Jeremy SalinesJames E Liendo, male    DOB: 12-14-1945, 71 y.o.   MRN: [id number]  HPI  71 y/o male with pmh of diabetic polyneuropathy, OA, chronic pain, tobacco abuse, HTN, COPD presents follow up chronic pain >low back, midline.   Initially, pt stated it started 11/26/15 denies inciting event.  Getting progressively worse.  Pain meds improve the pain. No pain meds alleviate the pain.  Mostly dull pain. Non-radiating.  Constant.  He saw Ortho spine for evaluation of surgery.  He was referred to PT, which he completed.  It helped a little.  He exercises daily. He tried hydrocodone for 12 years, but it was stopped by her PCP.  Denies associated weakness and numbness.  He has fallen twice in the year prior to eval because he was not using his cane.  Pain limits pt from doing "normal things".    Last clinic visit 05/31/16. Since that time he had a BMP.  She continues to do HEP.  Still gets benefit from Gabapentin.  Doing well with Cymbalta.  He has his TENS unit, but has not used it yet.  He did well with the increase in Tramadol. Doing well with Robaxin 250 BID.  Cont Naproxen as this has been doing well.  He states he has not needed the Lidoderm patch.  He believes the increase in Elavil helped.  Pt states this is the best 6 weeks he has had in a long time.    Pain Inventory Average Pain 3 Pain Right Now 3 My pain is dull, tingling and aching  In the last 24 hours, has pain interfered with the following? General activity 1 Relation with others 1 Enjoyment of life 1 What TIME of day is your pain at its worst? daytime Sleep (in general) Good  Pain is worse with: walking, bending, sitting, standing and some activites Pain improves with: therapy/exercise, pacing activities and medication Relief from Meds: 9  Mobility walk with assistance use a cane how many minutes can you walk? 30 ability to climb steps?  yes do you drive?   yes  Function retired  Neuro/Psych numbness tingling trouble walking spasms anxiety  Prior Studies Any changes since last visit?  no   Physicians involved in care Any changes since last visit?  no   Family History  Problem Relation Age of Onset  . Cancer Mother     Unknown   . Diabetes Sister    Social History   Social History  . Marital status: Married    Spouse name: N/A  . Number of children: N/A  . Years of education: N/A   Occupational History  . Self Employed    Social History Main Topics  . Smoking status: Current Every Day Smoker    Types: Pipe    Last attempt to quit: 05/29/2011  . Smokeless tobacco: Never Used  . Alcohol use No  . Drug use: No  . Sexual activity: Not Asked   Other Topics Concern  . None   Social History Narrative   Regular exercise-no   Past Surgical History:  Procedure Laterality Date  . TESTICLE REMOVAL     R testicle   Past Medical History:  Diagnosis Date  . ANXIETY 10/25/2006  . COPD 10/25/2006  . DIABETES MELLITUS, TYPE I 10/25/2006  . 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 HYPERTENSION 10/25/2006  . Polyneuropathy in diabetes(357.2) 04/08/2007  . Psoriasis   . TOBACCO ABUSE 07/08/2007  . VENOUS INSUFFICIENCY 11/25/2007  BP 110/65   Pulse (!) 109   Resp 14   SpO2 (!) 65%   Opioid Risk Score:   Fall Risk Score:  `1  Depression screen PHQ 2/9  Depression screen [facility] 2/9 02/16/2016 02/08/2016 01/19/2014  Decreased Interest 2 0 0  Down, Depressed, Hopeless 1 0 0  PHQ - 2 Score 3 0 0  Altered sleeping 1 - -  Tired, decreased energy 2 - -  Change in appetite 1 - -  Feeling bad or failure about yourself  0 - -  Trouble concentrating 0 - -  Moving slowly or fidgety/restless 0 - -  Suicidal thoughts 0 - -  PHQ-9 Score 7 - -  Difficult doing work/chores Somewhat difficult - -    Review of Systems  Constitutional: Negative.  Negative for chills and fever.  HENT: Negative.   Eyes: Negative.   Respiratory: Negative.    Cardiovascular: Negative.   Gastrointestinal: Negative.   Endocrine:       High blood sugar    Genitourinary: Negative.   Musculoskeletal: Positive for arthralgias, back pain and gait problem.       Spasms   Skin: Negative.   Allergic/Immunologic: Negative.   Neurological: Positive for numbness. Negative for dizziness.       Tingling    Hematological: Negative.   Psychiatric/Behavioral: The patient is nervous/anxious.   All other systems reviewed and are negative.     Objective:   Physical Exam Gen: NAD. Vital signs reviewed HENT: Normocephalic, Atraumatic Eyes: EOMI. No discharge.  Cardio: RRR. No JVD.  Pulm: B/l clear to auscultation.  Effort normal Abd: Soft, BS+ MSK:  Gait antalgic with cane, improved.   No TTP.    No edema.  Neuro:    Sensation intact to light touch in all LE dermatomes, subjective slightly decreased at ankles  Strength  4+-5/5 in all LE myotomes (unchanged) Skin: Warm and Dry.  Intact. Psych: Normal mood and affect.     Assessment & Plan:  71 y/o male with pmh of diabetic polyneuropathy, OA, chronic pain, tobacco abuse, HTN, COPD presents for follow up of chronic pain >low back, midline.   1.  Chronic mechanical low back pain  Xray L-spine reviewed, mild degenerative changes.   Tylenol ineffective for pt  Pt released by Ortho  Pt completed PT, cont HEP  Mobic 15mg  d/ced due to lack of efficacy  Cont ice  Cont Gabapentin to 900 TID  Cont Cymbalta to 60mg    TENS PRN  Cont Tramadol 50 to 4/day (educated and discussed signs/symptoms serotonin syndrome and risk of seizures)  Cont Robaxin 250 BID PRN  Cont Naproxen 500 BID, labs reviewed and ordered  Cont Lidoderm PRN  Will decrease Elavil to 10qhs due to possibility of serotonin syndrome  2. Diabetic polyneuropathy  Cont Gabapentin 900 TID  3. Gait abnormality  Cont cane for safety, pt states good support with current single point cane  Encouraged compliance with diabetic  footware  4. Sleep disturbance  Will decrease Elavil to 10qhs due to possibility of serotonin syndrome

## 2016-07-23 ENCOUNTER — Other Ambulatory Visit: Payer: Self-pay | Admitting: Physical Medicine & Rehabilitation

## 2016-08-09 ENCOUNTER — Other Ambulatory Visit: Payer: Self-pay | Admitting: Physical Medicine & Rehabilitation

## 2016-08-15 ENCOUNTER — Other Ambulatory Visit: Payer: Self-pay | Admitting: Family Medicine

## 2016-08-15 DIAGNOSIS — E1149 Type 2 diabetes mellitus with other diabetic neurological complication: Secondary | ICD-10-CM

## 2016-08-27 ENCOUNTER — Other Ambulatory Visit: Payer: Self-pay | Admitting: Family Medicine

## 2016-08-27 ENCOUNTER — Other Ambulatory Visit: Payer: Self-pay | Admitting: Physical Medicine & Rehabilitation

## 2016-09-04 ENCOUNTER — Other Ambulatory Visit: Payer: Self-pay | Admitting: Physical Medicine & Rehabilitation

## 2016-09-13 ENCOUNTER — Encounter: Payer: Self-pay | Admitting: Physical Medicine & Rehabilitation

## 2016-09-13 ENCOUNTER — Encounter: Payer: PPO | Attending: Physical Medicine & Rehabilitation | Admitting: Physical Medicine & Rehabilitation

## 2016-09-13 ENCOUNTER — Ambulatory Visit: Payer: PPO | Admitting: Physical Medicine & Rehabilitation

## 2016-09-13 VITALS — BP 130/77 | HR 108

## 2016-09-13 DIAGNOSIS — L409 Psoriasis, unspecified: Secondary | ICD-10-CM | POA: Diagnosis not present

## 2016-09-13 DIAGNOSIS — G894 Chronic pain syndrome: Secondary | ICD-10-CM

## 2016-09-13 DIAGNOSIS — Z794 Long term (current) use of insulin: Secondary | ICD-10-CM | POA: Diagnosis not present

## 2016-09-13 DIAGNOSIS — M1990 Unspecified osteoarthritis, unspecified site: Secondary | ICD-10-CM | POA: Diagnosis not present

## 2016-09-13 DIAGNOSIS — M545 Low back pain, unspecified: Secondary | ICD-10-CM

## 2016-09-13 DIAGNOSIS — L301 Dyshidrosis [pompholyx]: Secondary | ICD-10-CM | POA: Diagnosis not present

## 2016-09-13 DIAGNOSIS — I872 Venous insufficiency (chronic) (peripheral): Secondary | ICD-10-CM | POA: Diagnosis not present

## 2016-09-13 DIAGNOSIS — R269 Unspecified abnormalities of gait and mobility: Secondary | ICD-10-CM | POA: Insufficient documentation

## 2016-09-13 DIAGNOSIS — E1142 Type 2 diabetes mellitus with diabetic polyneuropathy: Secondary | ICD-10-CM | POA: Insufficient documentation

## 2016-09-13 DIAGNOSIS — G8929 Other chronic pain: Secondary | ICD-10-CM | POA: Diagnosis not present

## 2016-09-13 DIAGNOSIS — J449 Chronic obstructive pulmonary disease, unspecified: Secondary | ICD-10-CM | POA: Diagnosis not present

## 2016-09-13 DIAGNOSIS — F17200 Nicotine dependence, unspecified, uncomplicated: Secondary | ICD-10-CM | POA: Diagnosis not present

## 2016-09-13 DIAGNOSIS — G479 Sleep disorder, unspecified: Secondary | ICD-10-CM | POA: Diagnosis not present

## 2016-09-13 DIAGNOSIS — I10 Essential (primary) hypertension: Secondary | ICD-10-CM | POA: Insufficient documentation

## 2016-09-13 DIAGNOSIS — F419 Anxiety disorder, unspecified: Secondary | ICD-10-CM | POA: Diagnosis not present

## 2016-09-13 MED ORDER — TRAMADOL HCL 50 MG PO TABS: 50.0000 mg | ORAL_TABLET | Freq: Four times a day (QID) | ORAL | 2 refills | Status: DC | PRN

## 2016-09-13 MED ORDER — NAPROXEN 500 MG PO TABS: 500.0000 mg | ORAL_TABLET | Freq: Two times a day (BID) | ORAL | 2 refills | Status: DC | PRN

## 2016-09-13 MED ORDER — DOCUSATE SODIUM 100 MG PO CAPS: 100.0000 mg | ORAL_CAPSULE | Freq: Three times a day (TID) | ORAL | 1 refills | Status: AC

## 2016-09-13 NOTE — Addendum Note (Signed)
Addended by: Maryla Morrow A on: 09/13/2016 01:39 PM   Modules accepted: Orders

## 2016-09-13 NOTE — Progress Notes (Signed)
Subjective:    Patient ID: Jeremy Gill, male    DOB: June 04, 1945, 71 y.o.   MRN: [id number]  HPI  71 y/o male with pmh of diabetic polyneuropathy, OA, chronic pain, tobacco abuse, HTN, COPD presents follow up chronic pain >low back, midline.   Initially, pt stated it started 11/26/15 denies inciting event.  Getting progressively worse.  Pain meds improve the pain. No pain meds alleviate the pain.  Mostly dull pain. Non-radiating.  Constant.  He saw Ortho spine for evaluation of surgery.  He was referred to PT, which he completed.  It helped a little.  He exercises daily. He tried hydrocodone for 12 years, but it was stopped by her PCP.  Denies associated weakness and numbness.  He has fallen twice in the year prior to eval because he was not using his cane.  Pain limits pt from doing "normal things".    Last clinic visit 07/12/16. He continues to take the medication as prescribed.  He notes every now and then he has to take an extra tramadol.  He did not have any problems with the decrease in the tramadol.  Denies falls since last visit.    Pain Inventory Average Pain 4 Pain Right Now 6 My pain is dull, tingling and aching  In the last 24 hours, has pain interfered with the following? General activity 1 Relation with others 1 Enjoyment of life 1 What TIME of day is your pain at its worst? daytime Sleep (in general) Good  Pain is worse with: walking, bending, sitting, standing and some activites Pain improves with: therapy/exercise, pacing activities and medication Relief from Meds: 9  Mobility walk with assistance use a cane how many minutes can you walk? 30 ability to climb steps?  yes do you drive?  yes  Function retired  Neuro/Psych numbness tingling trouble walking spasms anxiety  Prior Studies Any changes since last visit?  no   Physicians involved in care Any changes since last visit?  no   Family History  Problem Relation Age of Onset  . Cancer Mother    Unknown   . Diabetes Sister    Social History   Social History  . Marital status: Married    Spouse name: N/A  . Number of children: N/A  . Years of education: N/A   Occupational History  . Self Employed    Social History Main Topics  . Smoking status: Current Every Day Smoker    Types: Pipe    Last attempt to quit: 05/29/2011  . Smokeless tobacco: Never Used  . Alcohol use No  . Drug use: No  . Sexual activity: Not on file   Other Topics Concern  . Not on file   Social History Narrative   Regular exercise-no   Past Surgical History:  Procedure Laterality Date  . TESTICLE REMOVAL     R testicle   Past Medical History:  Diagnosis Date  . ANXIETY 10/25/2006  . COPD 10/25/2006  . DIABETES MELLITUS, TYPE I 10/25/2006  . 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 HYPERTENSION 10/25/2006  . Polyneuropathy in diabetes(357.2) 04/08/2007  . Psoriasis   . TOBACCO ABUSE 07/08/2007  . VENOUS INSUFFICIENCY 11/25/2007   There were no vitals taken for this visit.  Opioid Risk Score:   Fall Risk Score:  `1  Depression screen PHQ 2/9  Depression screen [facility] 2/9 02/16/2016 02/08/2016 01/19/2014  Decreased Interest 2 0 0  Down, Depressed, Hopeless 1 0 0  PHQ - 2 Score 3 0  0  Altered sleeping 1 - -  Tired, decreased energy 2 - -  Change in appetite 1 - -  Feeling bad or failure about yourself  0 - -  Trouble concentrating 0 - -  Moving slowly or fidgety/restless 0 - -  Suicidal thoughts 0 - -  PHQ-9 Score 7 - -  Difficult doing work/chores Somewhat difficult - -    Review of Systems  Constitutional: Negative.  Negative for chills and fever.  HENT: Negative.   Eyes: Negative.   Respiratory: Negative.   Cardiovascular: Negative.   Gastrointestinal: Negative.   Endocrine:       High blood sugar    Genitourinary: Negative.   Musculoskeletal: Positive for arthralgias, back pain and gait problem.       Spasms   Skin: Negative.   Allergic/Immunologic: Negative.   Neurological: Positive  for numbness. Negative for dizziness.       Tingling    Hematological: Negative.   Psychiatric/Behavioral: The patient is nervous/anxious.   All other systems reviewed and are negative.     Objective:   Physical Exam Gen: NAD. Vital signs reviewed HENT: Normocephalic, Atraumatic Eyes: EOMI. No discharge.  Cardio: RRR. No JVD.  Pulm: B/l clear to auscultation.  Effort normal Abd: Soft, BS+ MSK:  Gait antalgic with cane, improved.   No TTP.    No edema.  Neuro:    Sensation intact to light touch in all LE dermatomes, subjective slightly decreased at ankles  Strength  4+-5/5 in all LE myotomes (stable) Skin: Warm and Dry.  Intact. Psych: Normal mood and affect.     Assessment & Plan:  71 y/o male with pmh of diabetic polyneuropathy, OA, chronic pain, tobacco abuse, HTN, COPD presents for follow up of chronic pain >low back, midline.   1.  Chronic mechanical low back pain  Xray L-spine reviewed, mild degenerative changes.   Tylenol ineffective for pt  Mobic  d/ced due to lack of efficacy  Pt released by Ortho  Pt completed PT, cont HEP  Cont ice  Cont Gabapentin to 900 TID  Cont Cymbalta to    TENS PRN  Cont Tramadol 50 to 4/day (educated and discussed signs/symptoms serotonin syndrome and risk of seizures)  Cont Robaxin 250 BID PRN  Cont Naproxen 500 BID PRN  Cont Lidoderm PRN  Cont Elavil to 10qhs  Encouraged physical activity  2. Diabetic polyneuropathy  Cont Gabapentin 900 TID  3. Gait abnormality  Cont cane for safety, pt states good support with current single point cane  Encouraged compliance with diabetic footware  4. Sleep disturbance  Cont Elavil 10qhs  5. Constipation  Colace ordered

## 2016-09-13 NOTE — Addendum Note (Signed)
Addended by: Maryla Morrow A on: 09/13/2016 01:36 PM   Modules accepted: Orders

## 2016-10-03 ENCOUNTER — Other Ambulatory Visit: Payer: Self-pay | Admitting: Physical Medicine & Rehabilitation

## 2016-10-03 NOTE — Telephone Encounter (Signed)
Recieved electronic medication refill request for gabapentin, previous note states:   Cont Gabapentin to 900 TID,    Current prescription states 300mg  TID  Is the patient to take 300mg  TID to equal 900mg  a day, or is the patient supposed to take 900mg  Three times a day  Please advise

## 2016-10-03 NOTE — Telephone Encounter (Signed)
900 TID. That is an old prescription. Thanks.

## 2016-10-17 ENCOUNTER — Other Ambulatory Visit: Payer: Self-pay | Admitting: Physical Medicine & Rehabilitation

## 2016-10-29 ENCOUNTER — Other Ambulatory Visit: Payer: Self-pay | Admitting: Physical Medicine & Rehabilitation

## 2016-10-30 ENCOUNTER — Ambulatory Visit: Payer: PPO

## 2016-10-30 VITALS — BP 106/60 | HR 100 | Ht 72.0 in | Wt 185.4 lb

## 2016-10-30 DIAGNOSIS — Z1211 Encounter for screening for malignant neoplasm of colon: Secondary | ICD-10-CM

## 2016-10-30 DIAGNOSIS — Z1159 Encounter for screening for other viral diseases: Secondary | ICD-10-CM

## 2016-10-30 NOTE — Patient Instructions (Addendum)
Jeremy Gill , Thank you for taking time to come for your Medicare Wellness Visit. I appreciate your ongoing commitment to your health goals. Please review the following plan we discussed and let me know if I can assist you in the future.   Will get eye examined soon  Go to the ER if you are afraid you have chest pain  At some point, Will make apt with Dr. [place] to recheck your BS; A1c and possible do an EKG and discuss your pain  Please bring a diary / log of you bs and what you have eaten for the last 5 days; and the time   Will have foot exam completed today   Darl Pikes will order the cologuard and you should receive it with in 1 to 2 weeks Therefore, Medicare Part B will cover the CologuardTM test once every three years for beneficiaries who meet all of the following criteria:  Age 50 to 85 years,  Asymptomatic (no signs or symptoms of colorectal disease including but not limited to lower gastrointestinal pain, blood in stool, positive guaiac fecal occult blood test or fecal immunochemical test), and  At average risk of developing colorectal cancer (no personal history of adenomatous polyps, colorectal cancer, or inflammatory bowel disease, including Crohn's Disease and ulcerative colitis; no family history of colorectal cancers or adenomatous polyps, familial adenomatous polyposis, or hereditary nonpolyposis colorectal cancer).   the plan To make apt with Dr. [place] to discuss your discomfort right chest area or go to the ER if it becomes consuming or does not stop Darl Pikes will order cologuard You will continue to negotiate pain meds with the pain management center Dr. Swaziland would like to run another A1c but can discuss when you are ready too.    These are the goals we discussed: Goals    . patient          To have your pain control           This is a list of the screening recommended for you and due dates:  Health Maintenance  Topic Date Due  .  Hepatitis C: One time  screening is recommended by Center for Disease Control  (CDC) for  adults born from [birth year] through 1965.   1945/09/08  . Eye exam for diabetics  12/18/2013  . Complete foot exam   01/20/2015  . Colon Cancer Screening  09/05/2015  . Hemoglobin A1C  08/07/2016  . Flu Shot  12/26/2016  . Tetanus Vaccine  08/06/2021  . Pneumonia vaccines  Completed   Prevention of falls: Remove rugs or any tripping hazards in the home Use Non slip mats in bathtubs and showers Placing grab bars next to the toilet and or shower Placing handrails on both sides of the stair way Adding extra lighting in the home.   Personal safety issues reviewed:  1. Consider starting a community watch program per [facility] 2.  Changes batteries is smoke detector and/or carbon monoxide detector  3.  If you have firearms; keep them in a safe place 4.  Wear protection when in the sun; Always wear sunscreen or a hat; It is good to have your doctor check your skin annually or review any new areas of concern 5. Driving safety; Keep in the right lane; stay 3 car lengths behind the car in front of you on the highway; look 3 times prior to pulling out; carry your cell phone everywhere you go!    Learn about the Yellow Dot  program:  The program allows first responders at your emergency to have access to who your physician is, as well as your medications and medical conditions.  Citizens requesting the Yellow Dot Packages should contact Master Corporal Gae DryKevin Wallace at the [facility] [phone] for the first week of the program and beginning the week after Easter citizens should contact their Water engineerlocal law enforcement agency.      Health Maintenance, Male A healthy lifestyle and preventive care is important for your health and wellness. Ask your health care provider about what schedule of regular examinations is right for you. What should I know about weight and diet? Eat a Healthy Diet  Eat plenty  of vegetables, fruits, whole grains, low-fat dairy products, and lean protein.  Do not eat a lot of foods high in solid fats, added sugars, or salt.  Maintain a Healthy Weight Regular exercise can help you achieve or maintain a healthy weight. You should:  Do at least 150 minutes of exercise each week. The exercise should increase your heart rate and make you sweat (moderate-intensity exercise).  Do strength-training exercises at least twice a week.  Watch Your Levels of Cholesterol and Blood Lipids  Have your blood tested for lipids and cholesterol every 5 years starting at 71 years of age. If you are at high risk for heart disease, you should start having your blood tested when you are 71 years old. You may need to have your cholesterol levels checked more often if: ? Your lipid or cholesterol levels are high. ? You are older than 71 years of age. ? You are at high risk for heart disease.  What should I know about cancer screening? Many types of cancers can be detected early and may often be prevented. Lung Cancer  You should be screened every year for lung cancer if: ? You are a current smoker who has smoked for at least 30 years. ? You are a former smoker who has quit within the past 15 years.  Talk to your health care provider about your screening options, when you should start screening, and how often you should be screened.  Colorectal Cancer  Routine colorectal cancer screening usually begins at 71 years of age and should be repeated every 5-10 years until you are 71 years old. You may need to be screened more often if early forms of precancerous polyps or small growths are found. Your health care provider may recommend screening at an earlier age if you have risk factors for colon cancer.  Your health care provider may recommend using home test kits to check for hidden blood in the stool.  A small camera at the end of a tube can be used to examine your colon (sigmoidoscopy  or colonoscopy). This checks for the earliest forms of colorectal cancer.  Prostate and Testicular Cancer  Depending on your age and overall health, your health care provider may do certain tests to screen for prostate and testicular cancer.  Talk to your health care provider about any symptoms or concerns you have about testicular or prostate cancer.  Skin Cancer  Check your skin from head to toe regularly.  Tell your health care provider about any new moles or changes in moles, especially if: ? There is a change in a mole's size, shape, or color. ? You have a mole that is larger than a pencil eraser.  Always use sunscreen. Apply sunscreen liberally and repeat throughout the day.  Protect yourself by wearing long  sleeves, pants, a wide-brimmed hat, and sunglasses when outside.  What should I know about heart disease, diabetes, and high blood pressure?  If you are 30-73 years of age, have your blood pressure checked every 3-5 years. If you are 53 years of age or older, have your blood pressure checked every year. You should have your blood pressure measured twice-once when you are at a hospital or clinic, and once when you are not at a hospital or clinic. Record the average of the two measurements. To check your blood pressure when you are not at a hospital or clinic, you can use: ? An automated blood pressure machine at a pharmacy. ? A home blood pressure monitor.  Talk to your health care provider about your target blood pressure.  If you are between 70-56 years old, ask your health care provider if you should take aspirin to prevent heart disease.  Have regular diabetes screenings by checking your fasting blood sugar level. ? If you are at a normal weight and have a low risk for diabetes, have this test once every three years after the age of 48. ? If you are overweight and have a high risk for diabetes, consider being tested at a younger age or more often.  A one-time screening  for abdominal aortic aneurysm (AAA) by ultrasound is recommended for men aged 65-75 years who are current or former smokers. What should I know about preventing infection? Hepatitis B If you have a higher risk for hepatitis B, you should be screened for this virus. Talk with your health care provider to find out if you are at risk for hepatitis B infection. Hepatitis C Blood testing is recommended for:  Everyone born from [birth year] through 1965.  Anyone with known risk factors for hepatitis C.  Sexually Transmitted Diseases (STDs)  You should be screened each year for STDs including gonorrhea and chlamydia if: ? You are sexually active and are younger than 71 years of age. ? You are older than 71 years of age and your health care provider tells you that you are at risk for this type of infection. ? Your sexual activity has changed since you were last screened and you are at an increased risk for chlamydia or gonorrhea. Ask your health care provider if you are at risk.  Talk with your health care provider about whether you are at high risk of being infected with HIV. Your health care provider may recommend a prescription medicine to help prevent HIV infection.  What else can I do?  Schedule regular health, dental, and eye exams.  Stay current with your vaccines (immunizations).  Do not use any tobacco products, such as cigarettes, chewing tobacco, and e-cigarettes. If you need help quitting, ask your health care provider.  Limit alcohol intake to no more than 2 drinks per day. One drink equals 12 ounces of beer, 5 ounces of wine, or 1 ounces of hard liquor.  Do not use street drugs.  Do not share needles.  Ask your health care provider for help if you need support or information about quitting drugs.  Tell your health care provider if you often feel depressed.  Tell your health care provider if you have ever been abused or do not feel safe at home. This information is not intended  to replace advice given to you by your health care provider. Make sure you discuss any questions you have with your health care provider. Document Released: 11/10/2007 Document Revised: 01/11/2016 Document Reviewed: 02/15/2015  Risk analyst Patient Education  [employer].    Fall Prevention in the Home Falls can cause injuries and can affect people from all age groups. There are many simple things that you can do to make your home safe and to help prevent falls. What can I do on the outside of my home?  Regularly repair the edges of walkways and driveways and fix any cracks.  Remove high doorway thresholds.  Trim any shrubbery on the main path into your home.  Use bright outdoor lighting.  Clear walkways of debris and clutter, including tools and rocks.  Regularly check that handrails are securely fastened and in good repair. Both sides of any steps should have handrails.  Install guardrails along the edges of any raised decks or porches.  Have leaves, snow, and ice cleared regularly.  Use sand or salt on walkways during winter months.  In the garage, clean up any spills right away, including grease or oil spills. What can I do in the bathroom?  Use night lights.  Install grab bars by the toilet and in the tub and shower. Do not use towel bars as grab bars.  Use non-skid mats or decals on the floor of the tub or shower.  If you need to sit down while you are in the shower, use a plastic, non-slip stool.  Keep the floor dry. Immediately clean up any water that spills on the floor.  Remove soap buildup in the tub or shower on a regular basis.  Attach bath mats securely with double-sided non-slip rug tape.  Remove throw rugs and other tripping hazards from the floor. What can I do in the bedroom?  Use night lights.  Make sure that a bedside light is easy to reach.  Do not use oversized bedding that drapes onto the floor.  Have a firm chair that has  side arms to use for getting dressed.  Remove throw rugs and other tripping hazards from the floor. What can I do in the kitchen?  Clean up any spills right away.  Avoid walking on wet floors.  Place frequently used items in easy-to-reach places.  If you need to reach for something above you, use a sturdy step stool that has a grab bar.  Keep electrical cables out of the way.  Do not use floor polish or wax that makes floors slippery. If you have to use wax, make sure that it is non-skid floor wax.  Remove throw rugs and other tripping hazards from the floor. What can I do in the stairways?  Do not leave any items on the stairs.  Make sure that there are handrails on both sides of the stairs. Fix handrails that are broken or loose. Make sure that handrails are as long as the stairways.  Check any carpeting to make sure that it is firmly attached to the stairs. Fix any carpet that is loose or worn.  Avoid having throw rugs at the top or bottom of stairways, or secure the rugs with carpet tape to prevent them from moving.  Make sure that you have a light switch at the top of the stairs and the bottom of the stairs. If you do not have them, have them installed. What are some other fall prevention tips?  Wear closed-toe shoes that fit well and support your feet. Wear shoes that have rubber soles or low heels.  When you use a stepladder, make sure that it is completely opened and that the sides are  firmly locked. Have someone hold the ladder while you are using it. Do not climb a closed stepladder.  Add color or contrast paint or tape to grab bars and handrails in your home. Place contrasting color strips on the first and last steps.  Use mobility aids as needed, such as canes, walkers, scooters, and crutches.  Turn on lights if it is dark. Replace any light bulbs that burn out.  Set up furniture so that there are clear paths. Keep the furniture in the same spot.  Fix any uneven  floor surfaces.  Choose a carpet design that does not hide the edge of steps of a stairway.  Be aware of any and all pets.  Review your medicines with your healthcare provider. Some medicines can cause dizziness or changes in blood pressure, which increase your risk of falling. Talk with your health care provider about other ways that you can decrease your risk of falls. This may include working with a physical therapist or trainer to improve your strength, balance, and endurance. This information is not intended to replace advice given to you by your health care provider. Make sure you discuss any questions you have with your health care provider. Document Released: 05/04/2002 Document Revised: 10/11/2015 Document Reviewed: 06/18/2014 Elsevier Interactive Patient Education  2017 [employer].

## 2016-10-30 NOTE — Progress Notes (Signed)
Subjective:   Jeremy Gill is a 71 y.o. male who presents for Medicare Annual/Subsequent preventive examination.  The Patient was informed that the wellness visit is to identify future health risk and educate and initiate measures that can reduce risk for increased disease through the lifespan.    NO ROS; Medicare Wellness Visit Psychosocial 2 ; one living Lost a son last Dec Son lived near him / had MI  He did not know he has Heart disease  Mr. Larence PenningBullard friends and son died of HD and stroke and is afraid he may have one Denies depression;   Describes health as good, fair or great? Fair to good  States he is not unhappy    Preventive Screening -Counseling & Management   Smoking history - Hx of tobacco abuse/ current smoker States he smokes a pipe; about one per hour  Never smoked cigarettes Started smoking a pipe at 71 years of age and has been smoking ever since;  01/2016 screening AAA/ discussed by Dr. [place]il[place]Jordan in 01/2016  Second Hand Smoke status; No Smokers in the home no  ETOH -  Beer on occasion  Medication adherence or issues?  No except he needs pain meds to help his pain Checks BS during the day; in the am;  Will not discuss what his BS have been   RISK FACTORS BMI 25;  Diet Breakfast; skips breakfast Lunch; eat light; sandwich; chips Supper; can't remember what he had Chicken salad or this is when he eats a good meal   Lipids; chol 199; HDL 51; LDL 132; Trig 73  Regular exercise  Up and down Does a lot of exercise Has some exercises for back;  Standing at the counter; and stretches Has a ball he uses to exercise  Isometric exercises   Cardiac Risk Factors:  Advanced aged > 55 in men;  Hyperlipidemia - HDL 51 and Trig 73;  Diabetes - Diabetes type 2   A1c 9.6 01/2016; 338 bs in 06/2016  Family History - (cancer and DM )   Obesity no  C/o of "chest pain" every so often but generally sitting when it occurs; the pain may last 5 minutes Has not checked  pulse during these events     Admits that his pain is frustrating and cannot get the medicine he needs to control his pain States tramadol is not helping but his original pain meds he took x 2 weeks ago helped     Fall risk one in the last year Bathroom; has tub with a shower combo Step in the tub; no chair in the tub  Grab bars upves in single family home  Given education on "Fall Prevention in the Home" for more safety tips the patient can apply as appropriate.   Mobility of Functional changes this year? Did not discuss; states he does not need a wheelchair   Mental Health:  Any emotional problems? Anxious, depressed, irritable, sad or blue? no Denies feeling depressed or hopeless; voices pleasure in daily life How many social activities have you been engaged in within the last 2 weeks? no  Hearing Screening Comments: Issues with left ear since he was young Had one hearing screen;  Had issues with ears stopping up  Will defer to Dr. [place]azilandJordan Vision Screening Comments: Has a cataract now Can not name doctor Did go once a year; but lost insurance  Now he is good. Will get eyes checked soon    Activities of Daily Living - See functional  screen   Cognitive testing; Ad8 score; 0 or less than 2  MMSE deferred or completed if AD8 + 2 issues Deferred due to time   Advanced Directives - declines at this time  Patient Care Team: [place], [name], MD as PCP - General (Family Medicine)   Immunization History  Administered Date(s) Administered  . Influenza Split 04/26/2011, 03/12/2012  . Influenza Whole 05/28/2004, 04/03/2007, 03/10/2008, 02/23/2009, 04/05/2010  . Influenza, High Dose Seasonal PF 03/29/2015  . Influenza,inj,Quad PF,36+ Mos 03/24/2013, 01/19/2014  . Pneumococcal Conjugate-13 01/19/2014  . Pneumococcal Polysaccharide-23 05/28/2001, 04/08/2007, 02/08/2016  . Td 05/28/2001  . Tdap 08/07/2011  . Zoster 12/12/2012   Required Immunizations needed  today  Screening test up to date or reviewed for plan of completion Health Maintenance Due  Topic Date Due  . Hepatitis C Screening  [birth year]  . OPHTHALMOLOGY EXAM  12/18/2013  . FOOT EXAM  01/20/2015  . COLONOSCOPY  09/05/2015  . HEMOGLOBIN A1C  08/07/2016          Objective:    Vitals: BP 106/60   Pulse 100   Ht 6' (1.829 m)   Wt 185 lb 6 oz (84.1 kg)   SpO2 97%   BMI 25.14 kg/m   Body mass index is 25.14 kg/m.  Tobacco History  Smoking Status  . Current Every Day Smoker  . Types: Pipe  . Last attempt to quit: 05/29/2011  Smokeless Tobacco  . Never Used     Ready to quit: No Counseling given: Yes   Past Medical History:  Diagnosis Date  . ANXIETY 10/25/2006  . COPD 10/25/2006  . DIABETES MELLITUS, TYPE I 10/25/2006  . 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 HYPERTENSION 10/25/2006  . Polyneuropathy in diabetes(357.2) 04/08/2007  . Psoriasis   . TOBACCO ABUSE 07/08/2007  . VENOUS INSUFFICIENCY 11/25/2007   Past Surgical History:  Procedure Laterality Date  . TESTICLE REMOVAL     R testicle   Family History  Problem Relation Age of Onset  . Cancer Mother        Unknown   . Diabetes Sister    History  Sexual Activity  . Sexual activity: Not on file    Outpatient Encounter Prescriptions as of 10/30/2016  Medication Sig  . amitriptyline (ELAVIL) 10 MG tablet Take 1 tablet (10 mg total) by mouth at bedtime.  Marland Kitchen aspirin 81 MG tablet Take 81 mg by mouth at bedtime. On M and F  . atorvastatin (LIPITOR) 20 MG tablet TAKE 1 TABLET BY MOUTH EVERY DAY  . BD INSULIN SYRINGE ULTRAFINE 31G X 5/16" 0.5 ML MISC USE DAILY  . busPIRone (BUSPAR) 15 MG tablet Take 2 tablets (30 mg total) by mouth 2 (two) times daily.  . cloNIDine (CATAPRES) 0.1 MG tablet Take 1 tablet (0.1 mg total) by mouth every evening.  . DULoxetine (CYMBALTA) 60 MG capsule TAKE 1 CAPSULE BY MOUTH EVERY DAY  . fluocinonide cream (LIDEX) 0.05 % Apply topically 3 (three) times daily as needed. For itching  .  furosemide (LASIX) 20 MG tablet Take 1 tablet (20 mg total) by mouth every evening.  . gabapentin (NEURONTIN) 300 MG capsule TAKE 3 CAPSULES BY MOUTH 3 TIMES DAILY  . gabapentin (NEURONTIN) 300 MG capsule Take 3 capsules (900 mg total) by mouth 3 (three) times daily.  Marland Kitchen glucose blood test strip One touch verio.  Use up to 4 times a day.  E10.9  . insulin glargine (LANTUS) 100 UNIT/ML injection Inject 0.25 mLs (25 Units total) into the skin  at bedtime.  . Insulin Syringe-Needle U-100 (BD INSULIN SYRINGE ULTRAFINE) 31G X 5/16" 0.5 ML MISC   . Lancets (ONETOUCH ULTRASOFT) lancets QID Dx 250.01  . lisinopril (PRINIVIL,ZESTRIL) 40 MG tablet Take 1 tablet (40 mg total) by mouth daily.  . methocarbamol (ROBAXIN) 500 MG tablet Take 1/2 tablets by mouth every 8 hours as needed for muscle spasms.  . naproxen (NAPROSYN) 500 MG tablet Take 1 tablet (500 mg total) by mouth 2 (two) times daily as needed.  . naproxen (NAPROSYN) 500 MG tablet TAKE 1 TABLET BY MOUTH 2 TIMES DAILY WITH A MEAL  . NOVOLOG 100 UNIT/ML injection INJECT 15 UNITS SUBCUTANEOUSLY 3 TIMES DAILY BEFORE MEALS  . traMADol (ULTRAM) 50 MG tablet Take 1 tablet (50 mg total) by mouth every 6 (six) hours as needed.  . triamcinolone ointment (KENALOG) 0.1 % Apply topically 2 (two) times daily. 1:1 compound with Eucerin   No facility-administered encounter medications on file as of 10/30/2016.     Activities of Daily Living No flowsheet data found.  Patient Care Team: [place], [name], MD as PCP - General (Family Medicine)   Assessment:    Discussed A1c and diabetes management The patient ventilated his frustration at the medical profession discussing his diabetes and writing into the computer. Let him ventilate and then asked how his health providers can help. States he is frustrated because he can't have pain meds due the addiction factor and he is not an addict and he has never been one. Asked if he needed to see a new pain doctor and stated  no, he is managing where he is and feels this doctor understands him.  Agreed that he does not want to discuss his diabetes until he gets his pain under control. Agreed to make apt with Dr. [place]and to discuss the chest pain he feels he is having which last for  5 minutes intervals; Informed to go to ER if he continues to have discomfort in the right chest area.   Finally he agrees that he is not interested in discussing his diabetes until his pain is under control.   He is not willing to make any changes at the current time    Exercise Activities and Dietary recommendations  declined to discuss; declined w/c;    Goals    . patient          To have your pain control          Fall Risk Fall Risk  10/30/2016 09/13/2016 07/12/2016 05/31/2016 05/04/2016  Falls in the past year? Yes No Yes Yes Yes  Number falls in past yr: 1 - 1 1 1   Injury with Fall? - - No No No  Risk for fall due to : - - - - -   Depression Screen PHQ 2/9 Scores 10/30/2016 02/16/2016 02/08/2016 01/19/2014  PHQ - 2 Score 0 3 0 0  PHQ- 9 Score - 7 - -    denies overt depression; he does express anger around his pain issues       Immunization History  Administered Date(s) Administered  . Influenza Split 04/26/2011, 03/12/2012  . Influenza Whole 05/28/2004, 04/03/2007, 03/10/2008, 02/23/2009, 04/05/2010  . Influenza, High Dose Seasonal PF 03/29/2015  . Influenza,inj,Quad PF,36+ Mos 03/24/2013, 01/19/2014  . Pneumococcal Conjugate-13 01/19/2014  . Pneumococcal Polysaccharide-23 05/28/2001, 04/08/2007, 02/08/2016  . Td 05/28/2001  . Tdap 08/07/2011  . Zoster 12/12/2012   Screening Tests Health Maintenance  Topic Date Due  . Hepatitis C Screening  29-Nov-1945  .  OPHTHALMOLOGY EXAM  12/18/2013  . FOOT EXAM  01/20/2015  . COLONOSCOPY  09/05/2015  . HEMOGLOBIN A1C  08/07/2016  . INFLUENZA VACCINE  12/26/2016  . TETANUS/TDAP  08/06/2021  . PNA vac Low Risk Adult  Completed      Plan:     PCP Notes  Health  Maintenance  agreed to hep c at next blood draw Agrees to have an eye exam now that he has insurance Foot exam reviewed today  Declined colonoscopy but did agree to the cologuard. Will order and fax exact science  Will have a1c; but does not want to address his DM until his pain is controlled   Abnormal Screens   Referrals   Patient concerns; states hearing in left is an issue Uses alcohol /vinegar solution to clean ears Will come in to have ears checked  May need hearing screen  Apt to see Dr. [place]and regarding occasional chest pain and tachy rate; (100) states his son just died of MI in 04-Jun-2023. As well as a friend. BS in poor control and will make apt with Dr. Swaziland primarily to fup on his chest discomfort; appears fearful of Chest pain since the loss of his son and friend with Mi   Nurse Concerns; as stated above  Angry that pain mgmt does can given him meds to control his pain but states he wants to continue at the pain management clinic   Next PCP apt Wife will call and set it up    I have personally reviewed and noted the following in the patient's chart:   . Medical and social history . Use of alcohol, tobacco or illicit drugs  . Current medications and supplements . Functional ability and status . Nutritional status . Physical activity . Advanced directives . List of other physicians . Hospitalizations, surgeries, and ER visits in previous 12 months . Vitals . Screenings to include cognitive, depression, and falls . Referrals and appointments  In addition, I have reviewed and discussed with patient certain preventive protocols, quality metrics, and best practice recommendations. A written personalized care plan for preventive services as well as general preventive health recommendations were provided to patient.     Montine Circle, RN  10/30/2016

## 2016-10-31 NOTE — Progress Notes (Signed)
I have reviewed documentation from this visit and I agree with recommendations given.   He missed follow-up appt, needs to re-schedule, not compliant.  Tieisha Darden G. SwazilandJordan, MD  [facility]. [place] office.

## 2016-11-07 ENCOUNTER — Ambulatory Visit: Payer: PPO | Admitting: Family Medicine

## 2016-11-09 ENCOUNTER — Other Ambulatory Visit: Payer: Self-pay | Admitting: Family Medicine

## 2016-11-09 ENCOUNTER — Other Ambulatory Visit: Payer: Self-pay | Admitting: Physical Medicine & Rehabilitation

## 2016-11-09 DIAGNOSIS — E1149 Type 2 diabetes mellitus with other diabetic neurological complication: Secondary | ICD-10-CM

## 2016-11-09 DIAGNOSIS — F17200 Nicotine dependence, unspecified, uncomplicated: Secondary | ICD-10-CM

## 2016-11-12 NOTE — Progress Notes (Signed)
HPI:   Jeremy Gill is a 71 y.o. male, who is here today to follow on some chronic medical problems.  I last saw him in 01/2016, he has not followed as recommended.  Hx of chronic pain, he is currently following with Dr Allena Katz.   Diabetes Mellitus II:   Dx about 16-17 years ago. He was previously following with Dr Everardo All, endocrinologists.  Currently on Lantus 25 U and Novolog 15 U before meals. Problem has been poorly controlled. Checking BS's : FG 200's and post prandial 300's.   Hypoglycemia:Denies  Last eye exam: "A couple years" ago. Dental exam: 2 years ago.  He is tolerating medications well. He denies polydipsia, polyuria, or polyphagia. + Feet numbness and burning.  Hx of neuropathy and radicular pain. He is on Gabapentin 900 mg tid and Cymbalta 60 mg daily.   Lab Results  Component Value Date   CREATININE 0.92 07/05/2016   BUN 24 07/05/2016   NA 132 (L) 07/05/2016   K 4.6 07/05/2016   CL 94 (L) 07/05/2016   CO2 31 (H) 07/05/2016    Lab Results  Component Value Date   HGBA1C 9.6 (H) 02/08/2016   Lab Results  Component Value Date   MICROALBUR 0.7 02/08/2016    HTN: He is on Lisinopril 40 mg daily and Catapres 0.1 prn, once per week. BP readings at home 120-130's/99-103.  Denies severe/frequent headache, visual changes, exertional chest pain, dyspnea, palpitation, claudication, focal weakness, or edema.  Insomnia and anxiety:  He takes Amitriptyline 10 mg at bedtime. Sleeps well, about 7 hours. He has been taking this medication for years.  Hyperlipidemia:  Currently on Lipitor 20 mg. Following a low fat diet: Not consistently.  He has not noted side effects with medication.  Lab Results  Component Value Date   CHOL 199 02/08/2016   HDL 51.80 02/08/2016   LDLCALC 132 (H) 02/08/2016   LDLDIRECT 141.8 09/30/2008   TRIG 73.0 02/08/2016   CHOLHDL 4 02/08/2016     Concerns today:   -Elevated HR intermittently  for 3  months, this happens at any time, and usually at rest. Average HR: > 112. Also having intermittent chest discomfort from right to left chest, pressure type, lasts about 10 seconds. Denies associated dyspnea or diaphoresis. "Little bit" dizziness associated.  He feels like he is drinking enough fluids. He thinks this symptoms may be related to anxiety. He feels "nerveous" when these symptoms are present.  He denies heartburn.  He is currently on Buspar 30 mg bid and Cymbalta. He denies depressed mood or suicidal thoughts.  + Smoker.  -Fullness ear sensation for 2-3 weeks,and nasal congestion. He denies fever or chills. Chronic tinnitus, stable and bilateral. No sick contact or recent travel.  He takes Naproxen 500 mg and Tramadol.    Review of Systems  Constitutional: Positive for fatigue. Negative for activity change, appetite change, fever and unexpected weight change.  HENT: Positive for congestion, sore throat and tinnitus (Bilateral,chronic). Negative for mouth sores, nosebleeds, sinus pain and trouble swallowing.   Eyes: Negative for redness and visual disturbance.  Respiratory: Negative for cough, shortness of breath and wheezing.   Cardiovascular: Positive for chest pain, palpitations and leg swelling (stable).  Gastrointestinal: Negative for abdominal pain, nausea and vomiting.       No changes in bowel habits.  Endocrine: Negative for cold intolerance, heat intolerance, polydipsia, polyphagia and polyuria.  Genitourinary: Negative for decreased urine volume, dysuria and hematuria.  Musculoskeletal: Positive  for arthralgias, back pain and gait problem.  Skin: Negative for rash and wound.  Neurological: Positive for dizziness and numbness. Negative for syncope, weakness and headaches.  Hematological: Negative for adenopathy. Does not bruise/bleed easily.  Psychiatric/Behavioral: Positive for sleep disturbance. Negative for confusion and hallucinations. The patient is  nervous/anxious.       Current Outpatient Prescriptions on File Prior to Visit  Medication Sig Dispense Refill  . amitriptyline (ELAVIL) 10 MG tablet Take 1 tablet (10 mg total) by mouth at bedtime. 30 tablet 1  . aspirin 81 MG tablet Take 81 mg by mouth at bedtime. On M and F    . atorvastatin (LIPITOR) 20 MG tablet TAKE 1 TABLET BY MOUTH EVERY DAY 90 tablet 0  . BD INSULIN SYRINGE ULTRAFINE 31G X 5/16" 0.5 ML MISC USE DAILY 100 each 1  . busPIRone (BUSPAR) 15 MG tablet Take 2 tablets (30 mg total) by mouth 2 (two) times daily. 180 tablet 2  . DULoxetine (CYMBALTA) 60 MG capsule TAKE 1 CAPSULE BY MOUTH EVERY DAY 30 capsule 1  . fluocinonide cream (LIDEX) 0.05 % Apply topically 3 (three) times daily as needed. For itching 30 g 3  . furosemide (LASIX) 20 MG tablet Take 1 tablet (20 mg total) by mouth every evening. 90 tablet 3  . gabapentin (NEURONTIN) 300 MG capsule Take 3 capsules (900 mg total) by mouth 3 (three) times daily. 270 capsule 2  . glucose blood test strip One touch verio.  Use up to 4 times a day.  E10.9 400 each 3  . insulin glargine (LANTUS) 100 UNIT/ML injection Inject 0.25 mLs (25 Units total) into the skin at bedtime. 15 mL 1  . Insulin Syringe-Needle U-100 (BD INSULIN SYRINGE ULTRAFINE) 31G X 5/16" 0.5 ML MISC     . Lancets (ONETOUCH ULTRASOFT) lancets QID Dx 250.01 100 each 12  . lisinopril (PRINIVIL,ZESTRIL) 40 MG tablet Take 1 tablet (40 mg total) by mouth daily. 90 tablet 1  . methocarbamol (ROBAXIN) 500 MG tablet TAKE 1/2 TABLET BY MOUTH EVERY 8 HOURS AS NEEDED FOR MUSCLE SPASMS 45 tablet 1  . naproxen (NAPROSYN) 500 MG tablet Take 1 tablet (500 mg total) by mouth 2 (two) times daily as needed. 60 tablet 2  . NOVOLOG 100 UNIT/ML injection INJECT 15 UNITS SUBCUTANEOUSLY 3 TIMES DAILY BEFORE MEALS 40 mL 1  . traMADol (ULTRAM) 50 MG tablet Take 1 tablet (50 mg total) by mouth every 6 (six) hours as needed. 135 tablet 2  . triamcinolone ointment (KENALOG) 0.1 % Apply  topically 2 (two) times daily. 1:1 compound with Eucerin 453.6 g 3   No current facility-administered medications on file prior to visit.      Past Medical History:  Diagnosis Date  . ANXIETY 10/25/2006  . COPD 10/25/2006  . DIABETES MELLITUS, TYPE I 10/25/2006  . 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 HYPERTENSION 10/25/2006  . Polyneuropathy in diabetes(357.2) 04/08/2007  . Psoriasis   . TOBACCO ABUSE 07/08/2007  . VENOUS INSUFFICIENCY 11/25/2007   No Known Allergies  Social History   Social History  . Marital status: Married    Spouse name: N/A  . Number of children: N/A  . Years of education: N/A   Occupational History  . Self Employed    Social History Main Topics  . Smoking status: Current Every Day Smoker    Types: Pipe    Last attempt to quit: 05/29/2011  . Smokeless tobacco: Never Used  . Alcohol use No  . Drug use: No  .  Sexual activity: Not Asked   Other Topics Concern  . None   Social History Narrative   Regular exercise-no    Vitals:   11/13/16 1546 11/13/16 1706  BP: 130/80   Pulse: (!) 124 (!) 112  Resp: 12   O2 sat at RA 96% Body mass index is 25.26 kg/m.  Physical Exam  Nursing note and vitals reviewed. Constitutional: He is oriented to person, place, and time. He appears well-developed. No distress.  HENT:  Head: Atraumatic.  Right Ear: Tympanic membrane, external ear and ear canal normal.  Left Ear: Tympanic membrane, external ear and ear canal normal.  Mouth/Throat: Mucous membranes are normal. Abnormal dentition. Dental caries present. Posterior oropharyngeal erythema present. No oropharyngeal exudate or posterior oropharyngeal edema.  Soft palate erythema and whitish patches.  Eyes: Conjunctivae and EOM are normal. Pupils are equal, round, and reactive to light.  Neck: No JVD present. No tracheal deviation present. No thyroid mass and no thyromegaly (palpable) present.  Cardiovascular: Regular rhythm.  Tachycardia present.   No murmur  heard. Pulses:      Dorsalis pedis pulses are 2+ on the right side, and 2+ on the left side.  Respiratory: Effort normal and breath sounds normal. No respiratory distress.  GI: Soft. He exhibits no mass. There is no hepatomegaly. There is no tenderness.  Musculoskeletal: He exhibits edema (Trace pitting edema LE,bilateral.).  Lymphadenopathy:    He has no cervical adenopathy.  Neurological: He is alert and oriented to person, place, and time. He displays tremor (mild, head).  No focal deficit appreciated. Gait assisted with a cane.  Skin: Skin is warm. No rash noted. No erythema.  Psychiatric: His mood appears anxious. Cognition and memory are normal.  Poor groomed, good eye contact.   Foot exam:  Monofilament decreased bilateral. Peripheral pulses present (DP). + calluses + hypertrophic/long toenails.  ASSESSMENT AND PLAN:   Diagnoses and all orders for this visit:  Lab Results  Component Value Date   HGBA1C 14.1 (H) 11/13/2016   Lab Results  Component Value Date   CREATININE 1.08 11/13/2016   BUN 21 11/13/2016   NA 134 (L) 11/13/2016   K 4.4 11/13/2016   CL 96 11/13/2016   CO2 29 11/13/2016   Lab Results  Component Value Date   WBC 8.0 11/13/2016   HGB 12.8 (L) 11/13/2016   HCT 38.1 (L) 11/13/2016   MCV 91.1 11/13/2016   PLT 195.0 11/13/2016   Lab Results  Component Value Date   TSH 0.55 11/13/2016    Diabetes mellitus type 2 with neurological manifestations (HCC)  HgA1C pending. Poorly controlled. Educated about possible complications of DM, he already has neurologic disease. Treatment will be adjusted according to HgA1C. Regular exercise and healthy diet with avoidance of added sugar food intake is an important part of treatment and recommended. Annual eye exam, periodic dental and foot care recommended. Smoking cessation encouraged. F/U in 4 months  -     Hemoglobin A1c -     Fructosamine -     Comprehensive metabolic panel  Essential  hypertension  DBP elevated at home. Metoprolol Succinate added. Catapres discontinued. Caution with Naproxen. DASH-low salt diet recommended. Eye exam recommended annually. F/U in 3-4 months, before if needed.  -     EKG 12-Lead -     Comprehensive metabolic panel -     metoprolol succinate (TOPROL-XL) 25 MG 24 hr tablet; Take 1 tablet (25 mg total) by mouth daily.  Diabetic polyneuropathy associated with type 2  diabetes mellitus (HCC)  Stable. No changes in Gabapentin or Cymbalta. Foot care discussed.  Sinus tachycardia  Improved at the end of OV. EKG today: sinus tach, normal axis, no signs of acute ischemia. EKG 12/2013 no significant changes except for tachycardia now present. Amitriptyline could be aggravating problem, will start weaning medication off, take it q 2 days. Metoprolol Succinate added. Monitor R at home. Cardiology referral placed. Instructed about warning signs.  -     EKG 12-Lead -     CBC with Differential/Platelet -     TSH -     Comprehensive metabolic panel -     Ambulatory referral to Cardiology -     metoprolol succinate (TOPROL-XL) 25 MG 24 hr tablet; Take 1 tablet (25 mg total) by mouth daily.  Oral thrush  Nystatin sl to use until a week after lesions resolved. F/U as needed. -     nystatin (MYCOSTATIN) 100000 UNIT/ML suspension; Take 2 mLs (200,000 Units total) by mouth 4 (four) times daily.  Encounter for hepatitis C screening test for low risk patient -     Hepatitis C Antibody  Anxiety disorder, unspecified type  No changes in current management. After sinus tach and DM better controlled we may consider changing Buspar for a different med or increase dose but for now given her non compliance Hx I prefer to wait. Metoprolol may also help.  Tramadol and Cymbalta + Buspar increase risk of serotonin synd.  Eustachian tube dysfunction, bilateral  Autoinflation maneuvers a few times per day recommended. Avoid cold meds or  decongestants. Instructed about warning signs. F/U as needed.   I would like to see him sooner but he does not want more often than 6 months, agrees with 4 months.   -Jeremy Gill was advised to return sooner than planned today if new concerns arise.       Yameli Delamater G. [place], MD  [facility]. [place] office.

## 2016-11-13 ENCOUNTER — Ambulatory Visit (INDEPENDENT_AMBULATORY_CARE_PROVIDER_SITE_OTHER): Payer: PPO | Admitting: Family Medicine

## 2016-11-13 ENCOUNTER — Encounter: Payer: Self-pay | Admitting: Family Medicine

## 2016-11-13 VITALS — BP 130/80 | HR 112 | Resp 12 | Ht 72.0 in | Wt 186.2 lb

## 2016-11-13 DIAGNOSIS — R000 Tachycardia, unspecified: Secondary | ICD-10-CM | POA: Diagnosis not present

## 2016-11-13 DIAGNOSIS — E1142 Type 2 diabetes mellitus with diabetic polyneuropathy: Secondary | ICD-10-CM

## 2016-11-13 DIAGNOSIS — H6983 Other specified disorders of Eustachian tube, bilateral: Secondary | ICD-10-CM

## 2016-11-13 DIAGNOSIS — B370 Candidal stomatitis: Secondary | ICD-10-CM | POA: Diagnosis not present

## 2016-11-13 DIAGNOSIS — I10 Essential (primary) hypertension: Secondary | ICD-10-CM

## 2016-11-13 DIAGNOSIS — F419 Anxiety disorder, unspecified: Secondary | ICD-10-CM | POA: Diagnosis not present

## 2016-11-13 DIAGNOSIS — Z1159 Encounter for screening for other viral diseases: Secondary | ICD-10-CM

## 2016-11-13 DIAGNOSIS — E1149 Type 2 diabetes mellitus with other diabetic neurological complication: Secondary | ICD-10-CM

## 2016-11-13 MED ORDER — NYSTATIN 100000 UNIT/ML MT SUSP: 2.0000 mL | Freq: Four times a day (QID) | OROMUCOSAL | 0 refills | Status: DC

## 2016-11-13 MED ORDER — METOPROLOL SUCCINATE ER 25 MG PO TB24: 25.0000 mg | ORAL_TABLET | Freq: Every day | ORAL | 1 refills | Status: DC

## 2016-11-13 MED ORDER — METOPROLOL SUCCINATE ER 25 MG PO TB24: 25.0000 mg | ORAL_TABLET | Freq: Every day | ORAL | 3 refills | Status: DC

## 2016-11-13 NOTE — Patient Instructions (Addendum)
A few things to remember from today's visit:   Essential hypertension - Plan: EKG 12-Lead, Comprehensive metabolic panel  Diabetes mellitus type 2 with neurological manifestations (HCC) - Plan: Hemoglobin A1c, Fructosamine, Comprehensive metabolic panel  Diabetic polyneuropathy associated with type 2 diabetes mellitus (HCC)  Sinus tachycardia - Plan: EKG 12-Lead, CBC with Differential/Platelet, TSH, Comprehensive metabolic panel, Ambulatory referral to Cardiology  Metoprolol added. Amitriptyline decreased to q 2 days. Catapres discontinued.  Medication for thrush. Cardiology appt. Monitoring heart rate.  In 2 weeks BP readings and heart rate reading.  HgA1C goal < 7.0. Avoid sugar added food:regular soft drinks, energy drinks, and sports drinks. candy. cakes. cookies. pies and cobblers. sweet rolls, pastries, and donuts. fruit drinks, such as fruitades and fruit punch. dairy desserts, such as ice cream  Mediterranean diet has showed benefits for sugar control.  How much and what type of carbohydrate foods are important for managing diabetes. The balance between how much insulin is in your body and the carbohydrate you eat makes a difference in your blood glucose levels.  Fasting blood sugar ideally 130 or less, 2 hours after meals less than 180.   Regular exercise also will help with controlling disease, daily brisk walking as tolerated for 15-30 min definitively will help.   Avoid skipping meals, blood sugar might drop and cause serious problems. Remember checking feet periodically, good dental hygiene, and annual eye exam.     Please be sure medication list is accurate. If a new problem present, please set up appointment sooner than planned today.

## 2016-11-14 ENCOUNTER — Telehealth: Payer: Self-pay | Admitting: Family Medicine

## 2016-11-14 DIAGNOSIS — I10 Essential (primary) hypertension: Secondary | ICD-10-CM

## 2016-11-14 DIAGNOSIS — R000 Tachycardia, unspecified: Secondary | ICD-10-CM

## 2016-11-14 LAB — COMPREHENSIVE METABOLIC PANEL
ALK PHOS: 39 U/L (ref 39–117)
ALT: 12 U/L (ref 0–53)
AST: 14 U/L (ref 0–37)
Albumin: 4.2 g/dL (ref 3.5–5.2)
BUN: 21 mg/dL (ref 6–23)
CHLORIDE: 96 meq/L (ref 96–112)
CO2: 29 mEq/L (ref 19–32)
Calcium: 9.2 mg/dL (ref 8.4–10.5)
Creatinine, Ser: 1.08 mg/dL (ref 0.40–1.50)
GFR: 71.6 mL/min (ref >60.00)
GLUCOSE: 365 mg/dL — AB (ref 70–99)
POTASSIUM: 4.4 meq/L (ref 3.5–5.1)
SODIUM: 134 meq/L — AB (ref 135–145)
Total Bilirubin: 0.7 mg/dL (ref 0.2–1.2)
Total Protein: 6.8 g/dL (ref 6.0–8.3)

## 2016-11-14 LAB — TSH: TSH: 0.55 u[IU]/mL (ref 0.35–4.50)

## 2016-11-14 LAB — CBC WITH DIFFERENTIAL/PLATELET
BASOS PCT: 1.3 % (ref 0.0–3.0)
Basophils Absolute: 0.1 10*3/uL (ref 0.0–0.1)
EOS ABS: 0.3 10*3/uL (ref 0.0–0.7)
EOS PCT: 4.1 % (ref 0.0–5.0)
HCT: 38.1 % — ABNORMAL LOW (ref 39.0–52.0)
Hemoglobin: 12.8 g/dL — ABNORMAL LOW (ref 13.0–17.0)
LYMPHS ABS: 2.7 10*3/uL (ref 0.7–4.0)
Lymphocytes Relative: 34 % (ref 12.0–46.0)
MCHC: 33.7 g/dL (ref 30.0–36.0)
MCV: 91.1 fl (ref 78.0–100.0)
MONO ABS: 0.4 10*3/uL (ref 0.1–1.0)
Monocytes Relative: 5.4 % (ref 3.0–12.0)
Neutro Abs: 4.4 10*3/uL (ref 1.4–7.7)
Neutrophils Relative %: 55.2 % (ref 43.0–77.0)
PLATELETS: 195 10*3/uL (ref 150.0–400.0)
RBC: 4.18 Mil/uL — ABNORMAL LOW (ref 4.22–5.81)
RDW: 13.4 % (ref 11.5–15.5)
WBC: 8 10*3/uL (ref 4.0–10.5)

## 2016-11-14 LAB — HEPATITIS C ANTIBODY: HCV Ab: NEGATIVE

## 2016-11-14 LAB — HEMOGLOBIN A1C: Hgb A1c MFr Bld: 14.1 % — ABNORMAL HIGH (ref 4.6–6.5)

## 2016-11-14 MED ORDER — METOPROLOL SUCCINATE ER 25 MG PO TB24: 25.0000 mg | ORAL_TABLET | Freq: Every day | ORAL | 1 refills | Status: DC

## 2016-11-14 NOTE — Telephone Encounter (Signed)
[facility]

## 2016-11-14 NOTE — Telephone Encounter (Signed)
[facility]rolol succinate [facility]. [facility]

## 2016-11-16 LAB — FRUCTOSAMINE: Fructosamine: 676 umol/L — ABNORMAL HIGH (ref 190–270)

## 2016-11-19 ENCOUNTER — Other Ambulatory Visit: Payer: Self-pay

## 2016-11-19 DIAGNOSIS — B370 Candidal stomatitis: Secondary | ICD-10-CM

## 2016-11-19 MED ORDER — NYSTATIN 100000 UNIT/ML MT SUSP: 2.0000 mL | Freq: Four times a day (QID) | OROMUCOSAL | 0 refills | Status: DC

## 2016-11-19 MED ORDER — LIRAGLUTIDE 18 MG/3ML ~~LOC~~ SOPN: PEN_INJECTOR | SUBCUTANEOUS | 2 refills | Status: DC

## 2016-11-20 ENCOUNTER — Other Ambulatory Visit: Payer: Self-pay

## 2016-11-20 NOTE — Patient Outreach (Signed)
[facility] Care Management  11/20/2016  Lazarus SalinesJames E Clevenger Jan 20, 1946 [id number]   Telephone Screen  Referral Date: 11/20/16 Referral Source: HTA Concierge Referral Reason: " DM,in gap needs help with insulin(victoza,Lantus & Novolog)" Insurance: HTA   Outreach attempt # 1 to patient. No answer. RN CM left HIPAA compliant message along with contact info.        Plan: RN CM will make outreach attempt to patient within three business days if no return call from patient.   Antionette Fairyoshanda Mande Auvil, RN,BSN,CCM [facility] Telephonic Care Management Coordinator Direct Phone: [phone] Toll Free: [phone] Fax: [phone]

## 2016-11-21 ENCOUNTER — Encounter (INDEPENDENT_AMBULATORY_CARE_PROVIDER_SITE_OTHER): Payer: Self-pay

## 2016-11-22 NOTE — Progress Notes (Deleted)
Cardiology Office Note:    Date:  11/22/2016   ID:  Jeremy Gill, DOB 18-Nov-1945, MRN [id number]  PCP:  [place], [name], MD  Cardiologist:  Ellis Parents ***   Referring MD: [place], [name], MD   No chief complaint on file. ***  History of Present Illness:    Jeremy Gill is a 71 y.o. male who is being seen today for the evaluation of tachycardia and intermittent chest discomfort at the request of [place], Jeremy Expose, MD. ***  Mr. Bunyard has a past medical history significant for chronic pain, Diabetes type 2 on insulin and poorly controlled, hypertension, peripheral neuropathy, anxiety, hyperlipidemia, venous insufficiency, tobacco use, COPD.  At his PCP appointment on 11/13/16 he was noted to have an elevated heart rate over the previous 3 months that occurs at any time and usually at rest. He was also having intermittent chest discomfort for right to left chest, pressure type, lasting about 10 seconds with no assocaited dyspnea or dipahoresis but a "little bit" dizziness.    AAA screening on 02/21/16 found normal caliber abdominal aorta, common and external iliac arteries, without focal dilatation. Aorto-iliac atherosclerosis, without focal stenosis.  Hypertension  Lisinopril 40 catapres 0.1 prn once per week.  Labs 11/13/16- normal renal function, elevated glucose Hyperlipidemia, on lipitor 20, Lipid panel 02/08/16 TC 199, LDL 132, HDL 51.8, trig 73 DM 2 on insulin  A1c 14.1 on 11/13/16 ( usually in the 9 range)  No flowsheet data found.   Past Medical History:  Diagnosis Date  . ANKLE SPRAIN 12/16/2006   Qualifier: Diagnosis of  By: Tawanna Cooler MD, Eugenio Hoes   . ANXIETY 10/25/2006  . CELLULITIS, LEG, RIGHT 09/08/2007   Qualifier: Diagnosis of  By: Tawanna Cooler MD, Eugenio Hoes   . Chronic pain disorder 11/30/2015  . COPD 10/25/2006  . 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 DIABETES MELLITUS, TYPE I 10/25/2006    . Diabetic polyneuropathy (HCC) 04/08/2007   Qualifier: Diagnosis of  By: Tawanna Cooler MD, Eugenio Hoes   . 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 Essential hypertension 10/25/2006   Qualifier: Diagnosis of  By: Rosette Reveal RN, Jorene Minors   . Generalized osteoarthritis of multiple sites 11/30/2015  . HYPERTENSION 10/25/2006  . 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 Low back pain with radiation 01/23/2016  . Polyneuropathy in diabetes(357.2) 04/08/2007  . Psoriasis   . TOBACCO ABUSE 07/08/2007  . VENOUS INSUFFICIENCY 11/25/2007    Past Surgical History:  Procedure Laterality Date  . TESTICLE REMOVAL     R testicle    Current Medications: No outpatient prescriptions have been marked as taking for the 11/23/16 encounter (Appointment) with Berton Bon, NP.     Allergies:   Patient has no known allergies.   Social History   Social History  . Marital status: Married    Spouse name: N/A  . Number of children: N/A  . Years of education: N/A   Occupational History  . Self Employed    Social History Main Topics  . Smoking status: Current Every Day Smoker    Types: Pipe    Last attempt to quit: 05/29/2011  . Smokeless tobacco: Never Used  . Alcohol use No  . Drug use: No  . Sexual activity: Not on file   Other Topics Concern  . Not on file   Social History  Narrative   Regular exercise-no     Family History: The patient's ***family history includes Cancer in his mother; Diabetes in his sister. ROS:   Please see the history of present illness.    *** All other systems reviewed and are negative.  EKGs/Labs/Other Studies Reviewed:    The following studies were reviewed today: ***  EKG:  EKG is *** ordered today.  The ekg ordered today demonstrates ***  Recent Labs: 11/13/2016: ALT 12; BUN 21; Creatinine, Ser 1.08; Hemoglobin 12.8; Platelets 195.0; Potassium 4.4; Sodium 134; TSH 0.55   Recent Lipid Panel    Component Value Date/Time    CHOL 199 02/08/2016 1004   TRIG 73.0 02/08/2016 1004   TRIG 20 03/05/2006 1042   HDL 51.80 02/08/2016 1004   CHOLHDL 4 02/08/2016 1004   VLDL 14.6 02/08/2016 1004   LDLCALC 132 (H) 02/08/2016 1004   LDLDIRECT 141.8 09/30/2008 0851    Physical Exam:    VS:  There were no vitals taken for this visit.    Wt Readings from Last 3 Encounters:  11/13/16 186 lb 4 oz (84.5 kg)  10/30/16 185 lb 6 oz (84.1 kg)  02/08/16 204 lb 8 oz (92.8 kg)     GEN: *** Well nourished, well developed in no acute distress HEENT: Normal NECK: No JVD; No carotid bruits LYMPHATICS: No lymphadenopathy CARDIAC: ***RRR, no murmurs, rubs, gallops RESPIRATORY:  Clear to auscultation without rales, wheezing or rhonchi  ABDOMEN: Soft, non-tender, non-distended MUSCULOSKELETAL:  No edema; No deformity  SKIN: Warm and dry NEUROLOGIC:  Alert and oriented x 3 PSYCHIATRIC:  Normal affect   ASSESSMENT:    No diagnosis found. PLAN:    Pt was seen and examined by myself and Dr Marland Kitchen*** in clinic today.  In order of problems listed above:  1. ***   Medication Adjustments/Labs and Tests Ordered: Current medicines are reviewed at length with the patient today.  Concerns regarding medicines are outlined above. Labs and tests ordered and medication changes are outlined in the patient instructions below:  There are no Patient Instructions on file for this visit.   Signed, Berton BonJanine Galileah Piggee, NP  11/22/2016 6:04 PM    [facility]

## 2016-11-23 ENCOUNTER — Other Ambulatory Visit: Payer: Self-pay

## 2016-11-23 ENCOUNTER — Ambulatory Visit: Payer: PPO | Admitting: Cardiology

## 2016-11-23 NOTE — Patient Outreach (Signed)
[facility] Care Management  11/23/2016  Jeremy Gill 1946/05/18 [id number]   Telephone Screen  Referral Date: 11/20/16 Referral Source: HTA Concierge Referral Reason: " DM,in gap needs help with insulin(victoza,Lantus & Novolog)" Insurance: HTA   Outreach attempt #2 to patient. Preferred number for patient currently out of service. RN CM attempted two alternate numbers for patient and those numbers invalid as well.     Plan: RN CM will make outreach attempt to patient within three business days.   Jeremy Fairyoshanda Harshita Bernales, RN,BSN,CCM [facility] Care Management Telephonic Care Management Coordinator Direct Phone: [phone] Toll Free: [phone] Fax: [phone]

## 2016-11-26 ENCOUNTER — Ambulatory Visit (INDEPENDENT_AMBULATORY_CARE_PROVIDER_SITE_OTHER): Payer: PPO | Admitting: Interventional Cardiology

## 2016-11-26 ENCOUNTER — Encounter (INDEPENDENT_AMBULATORY_CARE_PROVIDER_SITE_OTHER): Payer: Self-pay

## 2016-11-26 ENCOUNTER — Encounter: Payer: Self-pay | Admitting: Interventional Cardiology

## 2016-11-26 VITALS — BP 122/70 | HR 83 | Ht 72.0 in | Wt 189.0 lb

## 2016-11-26 DIAGNOSIS — E782 Mixed hyperlipidemia: Secondary | ICD-10-CM | POA: Diagnosis not present

## 2016-11-26 DIAGNOSIS — R0789 Other chest pain: Secondary | ICD-10-CM | POA: Diagnosis not present

## 2016-11-26 DIAGNOSIS — E1149 Type 2 diabetes mellitus with other diabetic neurological complication: Secondary | ICD-10-CM

## 2016-11-26 DIAGNOSIS — F17200 Nicotine dependence, unspecified, uncomplicated: Secondary | ICD-10-CM | POA: Diagnosis not present

## 2016-11-26 DIAGNOSIS — I700 Atherosclerosis of aorta: Secondary | ICD-10-CM

## 2016-11-26 MED ORDER — ATORVASTATIN CALCIUM 40 MG PO TABS: 40.0000 mg | ORAL_TABLET | Freq: Every day | ORAL | 3 refills | Status: DC

## 2016-11-26 NOTE — Progress Notes (Signed)
Cardiology Office Note   Date:  11/26/2016   ID:  Jeremy Gill, DOB 03-30-1946, MRN [id number]  PCP:  Jeremy, [name], MD    No chief complaint on file. palpitations   Wt Readings from Last 3 Encounters:  11/26/16 189 lb (85.7 kg)  11/13/16 186 lb 4 oz (84.5 kg)  10/30/16 185 lb 6 oz (84.1 kg)       History of Present Illness: Jeremy Gill is a 71 y.o. male  Who has had DM since 2000.  He has had HTN as well, controled on meds.    He has had a long h/o a high HR.  He now noted that his heart beat skips on occasion, lasting a few seconds.  It can then follow with a pressure in his chest that lasts a few seconds.  Walking is his most strenuous activity.  THis does not cause any discomfort in his chest.  Chronic back problems prevent him from doing any intentional walking.    His son passed away from a heart attack.  He does not know of anyone else in his family having heart trouble.  Occasional dizziness.        Past Medical History:  Diagnosis Date  . ANKLE SPRAIN 12/16/2006   Qualifier: Diagnosis of  By: Tawanna Cooler MD, Eugenio Hoes   . ANXIETY 10/25/2006  . CELLULITIS, LEG, RIGHT 09/08/2007   Qualifier: Diagnosis of  By: Tawanna Cooler MD, Eugenio Hoes   . Chronic pain disorder 11/30/2015  . COPD 10/25/2006  . 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 DIABETES MELLITUS, TYPE I 10/25/2006  . Diabetic polyneuropathy (HCC) 04/08/2007   Qualifier: Diagnosis of  By: Tawanna Cooler MD, Eugenio Hoes   . 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 Essential hypertension 10/25/2006   Qualifier: Diagnosis of  By: Rosette Reveal RN, Jorene Minors   . Generalized osteoarthritis of multiple sites 11/30/2015  . HYPERTENSION 10/25/2006  . 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 Low back pain with radiation 01/23/2016  . Polyneuropathy in diabetes(357.2) 04/08/2007  . Psoriasis   . TOBACCO  ABUSE 07/08/2007  . VENOUS INSUFFICIENCY 11/25/2007    Past Surgical History:  Procedure Laterality Date  . TESTICLE REMOVAL     R testicle     Current Outpatient Prescriptions  Medication Sig Dispense Refill  . amitriptyline (ELAVIL) 10 MG tablet Take 1 tablet (10 mg total) by mouth at bedtime. 30 tablet 1  . aspirin 81 MG tablet Take 81 mg by mouth at bedtime. On M and F    . atorvastatin (LIPITOR) 20 MG tablet TAKE 1 TABLET BY MOUTH EVERY DAY 90 tablet 0  . BD INSULIN SYRINGE ULTRAFINE 31G X 5/16" 0.5 ML MISC USE DAILY 100 each 1  . busPIRone (BUSPAR) 15 MG tablet Take 2 tablets (30 mg total) by mouth 2 (two) times daily. 180 tablet 2  . DULoxetine (CYMBALTA) 60 MG capsule TAKE 1 CAPSULE BY MOUTH EVERY DAY 30 capsule 1  . fluocinonide cream (LIDEX) 0.05 % Apply topically 3 (three) times daily as needed. For itching 30 Gill 3  . furosemide (LASIX) 20 MG tablet Take 1 tablet (20 mg total) by mouth every evening. 90 tablet 3  . gabapentin (NEURONTIN) 300 MG capsule Take 3 capsules (900 mg total) by mouth 3 (three) times daily. 270 capsule 2  .  glucose blood test strip One touch verio.  Use up to 4 times a day.  E10.9 400 each 3  . insulin glargine (LANTUS) 100 UNIT/ML injection Inject 0.25 mLs (25 Units total) into the skin at bedtime. 15 mL 1  . Insulin Syringe-Needle U-100 (BD INSULIN SYRINGE ULTRAFINE) 31G X 5/16" 0.5 ML MISC     . Lancets (ONETOUCH ULTRASOFT) lancets QID Dx 250.01 100 each 12  . lisinopril (PRINIVIL,ZESTRIL) 40 MG tablet Take 1 tablet (40 mg total) by mouth daily. 90 tablet 1  . methocarbamol (ROBAXIN) 500 MG tablet TAKE 1/2 TABLET BY MOUTH EVERY 8 HOURS AS NEEDED FOR MUSCLE SPASMS 45 tablet 1  . metoprolol succinate (TOPROL-XL) 25 MG 24 hr tablet Take 1 tablet (25 mg total) by mouth daily. 30 tablet 1  . naproxen (NAPROSYN) 500 MG tablet Take 1 tablet (500 mg total) by mouth 2 (two) times daily as needed. 60 tablet 2  . NOVOLOG 100 UNIT/ML injection INJECT 15 UNITS  SUBCUTANEOUSLY 3 TIMES DAILY BEFORE MEALS 40 mL 1  . nystatin (MYCOSTATIN) 100000 UNIT/ML suspension Take 2 mLs (200,000 Units total) by mouth 4 (four) times daily. 60 mL 0  . traMADol (ULTRAM) 50 MG tablet Take 1 tablet (50 mg total) by mouth every 6 (six) hours as needed. 135 tablet 2  . triamcinolone ointment (KENALOG) 0.1 % Apply topically 2 (two) times daily. 1:1 compound with Eucerin 453.6 Gill 3  . liraglutide (VICTOZA) 18 MG/3ML SOPN Inject 0.6 mg subcutaneously into the skin daily, increase to 1.2 mg in a week if tolerated. (Patient not taking: Reported on 11/26/2016) 9 mL 2   No current facility-administered medications for this visit.     Allergies:   Patient has no known allergies.    Social History:  The patient  reports that he has been smoking Pipe.  He has never used smokeless tobacco. He reports that he does not drink alcohol or use drugs.   Family History:  The patient's family history includes Cancer in his mother; Diabetes in his sister.    ROS:  Please see the history of present illness.   Otherwise, review of systems are positive for back pain.   All other systems are reviewed and negative.    PHYSICAL EXAM: VS:  BP 122/70   Pulse 83   Ht 6' (1.829 m)   Wt 189 lb (85.7 kg)   SpO2 98%   BMI 25.63 kg/m  , BMI Body mass index is 25.63 kg/m. GEN: Well nourished, well developed, in no acute distress  HEENT: normal  Neck: no JVD, carotid bruits, or masses Cardiac: RRR; no murmurs, rubs, or gallops,no edema  Respiratory:  clear to auscultation bilaterally, normal work of breathing GI: soft, nontender, nondistended, + BS MS: no deformity or atrophy  Skin: warm and dry, no rash Neuro:  Strength and sensation are intact Psych: euthymic mood, full affect   EKG:   The ekg ordered today demonstrates sinus tach, no ST segment changes   Recent Labs: 11/13/2016: ALT 12; BUN 21; Creatinine, Ser 1.08; Hemoglobin 12.8; Platelets 195.0; Potassium 4.4; Sodium 134; TSH 0.55    Lipid Panel    Component Value Date/Time   CHOL 199 02/08/2016 1004   TRIG 73.0 02/08/2016 1004   TRIG 20 03/05/2006 1042   HDL 51.80 02/08/2016 1004   CHOLHDL 4 02/08/2016 1004   VLDL 14.6 02/08/2016 1004   LDLCALC 132 (H) 02/08/2016 1004   LDLDIRECT 141.8 09/30/2008 0851     Other studies  Reviewed: Additional studies/ records that were reviewed today with results demonstrating: 2017 aortic ultrasound reviewed.   ASSESSMENT AND PLAN:  1.  Chest pressure/ palpitations: Given long history of diabetes, would plan for nuclear stress test.  Some atypical features, but given that he is not very mobile, difficult to get a sense of exercise induced symptoms. Palpitatios sound more like skipped beats.   2. Hyperlipidemia: LDL above target given DM. Increase atorvastatin 40 mg daily.  3. Aortic atherosclerosis: No AAA by ultrasound in 9/17. 4. DM: Poorly controlled, A1C 14.1. Follwed with PMD. 5. Tobacco abuse: He needs to stop smoking.   Current medicines are reviewed at length with the patient today.  The patient concerns regarding his medicines were addressed.  The following changes have been made:  Increase atorvastatin  Labs/ tests ordered today include:  No orders of the defined types were placed in this encounter.   Recommend 150 minutes/week of aerobic exercise Low fat, low carb, high fiber diet recommended  Disposition:   FU for stress test.   Signed, Lance Muss, MD  11/26/2016 12:11 PM    [facility] [address] [place], [place], [place]  [address] Phone: [phone]; Fax: [phone]

## 2016-11-26 NOTE — Patient Instructions (Signed)
Medication Instructions: Your physician has recommended you make the following change in your medication:   INCREASE atorvastatin to 40 mg daily    Labwork: Your physician recommends that you return for FASTING lab work in: 3 months for LIPIDS, LFTs   Testing/Procedures: Your physician has requested that you have a lexiscan myoview. For further information please visit [email or web address]. Please follow instruction sheet, as given.   Follow-Up: Based on results of test   Any Other Special Instructions Will Be Listed Below (If Applicable).     If you need a refill on your cardiac medications before your next appointment, please call your pharmacy.

## 2016-11-27 ENCOUNTER — Other Ambulatory Visit: Payer: Self-pay

## 2016-11-27 NOTE — Patient Outreach (Signed)
[facility] Care Management  11/27/2016  Jeremy SalinesJames E Gill 09-17-1945 [id number]   Telephone Screen  Referral Date: 11/20/16 Referral Source: HTA Concierge Referral Reason: " DM,in gap needs help with insulin(victoza,Lantus & Novolog)" Insurance: HTA    Outreach attempt #3 to patient. No answer at present. RN CM left HIPAA compliant voicemail message along with contact info.    Plan: RN CM will send unsuccessful outreach letter to patient and close case if no response within 10 business days.   Antionette Fairyoshanda Cregg Jutte, RN,BSN,CCM [facility] Care Management Telephonic Care Management Coordinator Direct Phone: [phone] Toll Free: [phone] Fax: [phone]

## 2016-11-29 ENCOUNTER — Telehealth (HOSPITAL_COMMUNITY): Payer: Self-pay | Admitting: *Deleted

## 2016-11-29 NOTE — Telephone Encounter (Signed)
Patient given detailed instructions per Myocardial Perfusion Study Information Sheet for the test on 12/04/16 at 1000. Patient notified to arrive 15 minutes early and that it is imperative to arrive on time for appointment to keep from having the test rescheduled.  If you need to cancel or reschedule your appointment, please call the office within 24 hours of your appointment. . Patient verbalized understanding.Annarose Ouellet, Adelene IdlerCynthia W

## 2016-12-04 ENCOUNTER — Ambulatory Visit (HOSPITAL_COMMUNITY): Payer: PPO | Attending: Internal Medicine

## 2016-12-04 DIAGNOSIS — R0789 Other chest pain: Secondary | ICD-10-CM | POA: Diagnosis not present

## 2016-12-04 MED ORDER — REGADENOSON 0.4 MG/5ML IV SOLN
0.4000 mg | Freq: Once | INTRAVENOUS | Status: AC
  Administered 2016-12-04: 0.4 mg via INTRAVENOUS

## 2016-12-04 MED ORDER — TECHNETIUM TC 99M TETROFOSMIN IV KIT
10.4000 | PACK | Freq: Once | INTRAVENOUS | Status: AC | PRN
  Administered 2016-12-04: 10.4 via INTRAVENOUS
  Filled 2016-12-04: qty 11

## 2016-12-04 MED ORDER — TECHNETIUM TC 99M TETROFOSMIN IV KIT
32.6000 | PACK | Freq: Once | INTRAVENOUS | Status: AC | PRN
  Administered 2016-12-04: 32.6 via INTRAVENOUS
  Filled 2016-12-04: qty 33

## 2016-12-05 LAB — MYOCARDIAL PERFUSION IMAGING
CHL CUP NUCLEAR SDS: 7
CHL CUP NUCLEAR SRS: 7
CHL CUP NUCLEAR SSS: 14
CHL CUP RESTING HR STRESS: 72 {beats}/min
CSEPPHR: 87 {beats}/min
LV dias vol: 86 mL (ref 62–150)
LV sys vol: 37 mL
RATE: 0.29
TID: 1.01

## 2016-12-11 ENCOUNTER — Other Ambulatory Visit: Payer: Self-pay

## 2016-12-11 MED ORDER — PEN NEEDLES 32G X 4 MM MISC: 1.0000 "pen " | Freq: Once | 3 refills | Status: AC

## 2016-12-13 ENCOUNTER — Encounter: Payer: PPO | Attending: Physical Medicine & Rehabilitation | Admitting: Physical Medicine & Rehabilitation

## 2016-12-13 ENCOUNTER — Other Ambulatory Visit: Payer: Self-pay | Admitting: Family Medicine

## 2016-12-13 ENCOUNTER — Other Ambulatory Visit: Payer: Self-pay | Admitting: Physical Medicine & Rehabilitation

## 2016-12-13 ENCOUNTER — Encounter: Payer: Self-pay | Admitting: Physical Medicine & Rehabilitation

## 2016-12-13 VITALS — BP 102/61 | HR 80

## 2016-12-13 DIAGNOSIS — I10 Essential (primary) hypertension: Secondary | ICD-10-CM

## 2016-12-13 DIAGNOSIS — G894 Chronic pain syndrome: Secondary | ICD-10-CM | POA: Diagnosis not present

## 2016-12-13 DIAGNOSIS — M1990 Unspecified osteoarthritis, unspecified site: Secondary | ICD-10-CM | POA: Insufficient documentation

## 2016-12-13 DIAGNOSIS — G8929 Other chronic pain: Secondary | ICD-10-CM | POA: Diagnosis not present

## 2016-12-13 DIAGNOSIS — R269 Unspecified abnormalities of gait and mobility: Secondary | ICD-10-CM

## 2016-12-13 DIAGNOSIS — L409 Psoriasis, unspecified: Secondary | ICD-10-CM | POA: Diagnosis not present

## 2016-12-13 DIAGNOSIS — F17200 Nicotine dependence, unspecified, uncomplicated: Secondary | ICD-10-CM | POA: Diagnosis not present

## 2016-12-13 DIAGNOSIS — I872 Venous insufficiency (chronic) (peripheral): Secondary | ICD-10-CM | POA: Diagnosis not present

## 2016-12-13 DIAGNOSIS — L301 Dyshidrosis [pompholyx]: Secondary | ICD-10-CM | POA: Diagnosis not present

## 2016-12-13 DIAGNOSIS — M545 Low back pain: Secondary | ICD-10-CM | POA: Insufficient documentation

## 2016-12-13 DIAGNOSIS — G479 Sleep disorder, unspecified: Secondary | ICD-10-CM | POA: Insufficient documentation

## 2016-12-13 DIAGNOSIS — J449 Chronic obstructive pulmonary disease, unspecified: Secondary | ICD-10-CM | POA: Insufficient documentation

## 2016-12-13 DIAGNOSIS — E1142 Type 2 diabetes mellitus with diabetic polyneuropathy: Secondary | ICD-10-CM | POA: Insufficient documentation

## 2016-12-13 DIAGNOSIS — E0842 Diabetes mellitus due to underlying condition with diabetic polyneuropathy: Secondary | ICD-10-CM

## 2016-12-13 DIAGNOSIS — Z794 Long term (current) use of insulin: Secondary | ICD-10-CM | POA: Diagnosis not present

## 2016-12-13 DIAGNOSIS — F419 Anxiety disorder, unspecified: Secondary | ICD-10-CM | POA: Diagnosis present

## 2016-12-13 DIAGNOSIS — R000 Tachycardia, unspecified: Secondary | ICD-10-CM

## 2016-12-13 NOTE — Progress Notes (Signed)
Subjective:    Patient ID: Jeremy Gill, male    DOB: 15-May-1946, 71 y.o.   MRN: [id number]  HPI  71 y/o male with pmh of diabetic polyneuropathy, OA, chronic pain, tobacco abuse, HTN, COPD presents follow up chronic pain >low back, midline.   Initially, pt stated it started 11/26/15 denies inciting event.  Getting progressively worse.  Pain meds improve the pain. No pain meds alleviate the pain.  Mostly dull pain. Non-radiating.  Constant.  He saw Ortho spine for evaluation of surgery.  He was referred to PT, which he completed.  It helped a little.  He exercises daily. He tried hydrocodone for 12 years, but it was stopped by her PCP.  Denies associated weakness and numbness.  He has fallen twice in the year prior to eval because he was not using his cane.  Pain limits pt from doing "normal things".    Last clinic visit 09/13/16. Since last visit, he continues to do HEP.  He is not using TENS.  He continues to take all medications.  Denies falls. Overall, he states he is doing well.  Overall, he states he is doing well.    Pain Inventory Average Pain 7 Pain Right Now 4 My pain is sharp, stabbing and aching  In the last 24 hours, has pain interfered with the following? General activity 4 Relation with others 2 Enjoyment of life 7 What TIME of day is your pain at its worst? daytime Sleep (in general) Good  Pain is worse with: walking, bending and standing Pain improves with: rest and medication Relief from Meds: 2  Mobility walk with assistance use a cane ability to climb steps?  yes do you drive?  yes  Function retired  Neuro/Psych weakness numbness trouble walking depression  Prior Studies Any changes since last visit?  no   Physicians involved in care Any changes since last visit?  no   Family History  Problem Relation Age of Onset  . Cancer Mother        Unknown   . Diabetes Sister    Social History   Social History  . Marital status: Married    Spouse  name: N/A  . Number of children: N/A  . Years of education: N/A   Occupational History  . Self Employed    Social History Main Topics  . Smoking status: Current Every Day Smoker    Types: Pipe    Last attempt to quit: 05/29/2011  . Smokeless tobacco: Never Used  . Alcohol use No  . Drug use: No  . Sexual activity: Not Asked   Other Topics Concern  . None   Social History Narrative   Regular exercise-no   Past Surgical History:  Procedure Laterality Date  . TESTICLE REMOVAL     R testicle   Past Medical History:  Diagnosis Date  . ANKLE SPRAIN 12/16/2006   Qualifier: Diagnosis of  By: Tawanna Cooler MD, Eugenio Hoes   . ANXIETY 10/25/2006  . CELLULITIS, LEG, RIGHT 09/08/2007   Qualifier: Diagnosis of  By: Tawanna Cooler MD, Eugenio Hoes   . Chronic pain disorder 11/30/2015  . COPD 10/25/2006  . 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 DIABETES MELLITUS, TYPE I 10/25/2006  . Diabetic polyneuropathy (HCC) 04/08/2007   Qualifier: Diagnosis of  By: Tawanna Cooler MD, Eugenio Hoes   . 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 Essential hypertension 10/25/2006   Qualifier: Diagnosis  of  By: Templeton, RN, Jorene MinorsAngela Dawn   . Generalized osteoarthritis of multiplRosette Reveale sites 11/30/2015  . HYPERTENSION 10/25/2006  . 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 Low back pain with radiation 01/23/2016  . Polyneuropathy in diabetes(357.2) 04/08/2007  . Psoriasis   . TOBACCO ABUSE 07/08/2007  . VENOUS INSUFFICIENCY 11/25/2007   BP 102/61   Pulse 80   SpO2 95%   Opioid Risk Score:   Fall Risk Score:  `1  Depression screen PHQ 2/9  [facility] 12/13/2016 10/30/2016 02/16/2016 02/08/2016 01/19/2014  Decreased Interest 0 (No Data) 2 0 0  Down, Depressed, Hopeless 0 0 1 0 0  PHQ - 2 Score 0 0 3 0 0  Altered sleeping - - 1 - -  Tired, decreased energy - - 2 - -  Change in appetite - - 1 - -    Feeling bad or failure about yourself  - - 0 - -  Trouble concentrating - - 0 - -  Moving slowly or fidgety/restless - - 0 - -  Suicidal thoughts - - 0 - -  PHQ-9 Score - - 7 - -  Difficult doing work/chores - - Somewhat difficult - -    Review of Systems  Constitutional: Negative.  Negative for chills and fever.  HENT: Negative.   Eyes: Negative.   Respiratory: Negative.   Cardiovascular: Negative.   Gastrointestinal: Negative.   Endocrine:       High blood sugar    Genitourinary: Negative.   Musculoskeletal: Positive for arthralgias, back pain and gait problem.       Spasms   Skin: Negative.   Allergic/Immunologic: Negative.   Neurological: Positive for numbness. Negative for dizziness.       Tingling    Hematological: Negative.   Psychiatric/Behavioral: The patient is nervous/anxious.   All other systems reviewed and are negative.     Objective:   Physical Exam Gen: NAD. Vital signs reviewed HENT: Normocephalic, Atraumatic Eyes: EOMI. No discharge.  Cardio: RRR. No JVD.  Pulm: B/l clear to auscultation.  Effort normal Abd: Soft, BS+ MSK:  Rait antalgic with cane, improved.   No TTP.    No edema.  Neuro:    Sensation intact to light touch in all LE dermatomes, subjective slightly decreased at ankles  Strength  4+/5 in all LE myotomes (stable) Skin: Warm and Dry.  Intact. Psych: Normal mood and affect.     Assessment & Plan:  71 y/o male with pmh of diabetic polyneuropathy, OA, chronic pain, tobacco abuse, HTN, COPD presents for follow up of chronic pain >low back, midline.   1.  Chronic mechanical low back pain  Xray L-spine reviewed, mild degenerative changes.   Tylenol ineffective for pt  Mobic 15mg  d/ced due to lack of efficacy  Pt released by Ortho  Pt completed PT, cont HEP  Cont ice  TENS PRN, not using at present  Cont Gabapentin to 900 TID  Cont Cymbalta to 60mg   Cont Tramadol 50 to 4/day (educated and discussed signs/symptoms serotonin  syndrome and risk of seizures)  Cont Robaxin 250 BID PRN  Cont Naproxen 500 BID PRN  Cont Lidoderm PRN  Cont Elavil to 10qhs  Encouraged physical activity  2. Diabetic polyneuropathy  Cont Gabapentin 900 TID  3. Gait abnormality  Cont cane for safety, pt states good support with current single point cane  Encouraged compliance with diabetic footware  4. Sleep  disturbance  Cont Elavil 10qhs  5. Constipation  Colace ordered

## 2016-12-17 ENCOUNTER — Other Ambulatory Visit: Payer: Self-pay

## 2016-12-17 NOTE — Patient Outreach (Signed)
[facility] Care Management  12/17/2016  Lazarus SalinesJames E Fulfer Feb 25, 1946 [id number]   Telephone Screen  Referral Date: 11/20/16 Referral Source: [employer] Concierge Referral Reason: " DM,in gap needs help with insulin(victoza,Lantus & Novolog)" Insurance: [employer]TA     Multiple attempts to establish contact with patient without success. No response from letter mailed to patient. Case is being closed at this time.    Plan: RN CM will notify [facility] administrative assistant of case closure status.  RN CM will send MD case closure letter.  Antionette Fairyoshanda Zane Samson, RN,BSN,CCM [facility] Care Management Telephonic Care Management Coordinator Direct Phone: [phone] Toll Free: [phone] Fax: [phone]

## 2016-12-18 ENCOUNTER — Emergency Department (HOSPITAL_COMMUNITY): Payer: PPO

## 2016-12-18 ENCOUNTER — Inpatient Hospital Stay (HOSPITAL_COMMUNITY)
Admission: EM | Admit: 2016-12-18 | Discharge: 2016-12-20 | DRG: 637 | Disposition: A | Discharge status: Home Health | Payer: PPO | Attending: Family Medicine | Admitting: Family Medicine

## 2016-12-18 ENCOUNTER — Encounter (HOSPITAL_COMMUNITY): Payer: Self-pay | Admitting: *Deleted

## 2016-12-18 DIAGNOSIS — Z794 Long term (current) use of insulin: Secondary | ICD-10-CM

## 2016-12-18 DIAGNOSIS — F17290 Nicotine dependence, other tobacco product, uncomplicated: Secondary | ICD-10-CM | POA: Diagnosis present

## 2016-12-18 DIAGNOSIS — E860 Dehydration: Secondary | ICD-10-CM | POA: Diagnosis not present

## 2016-12-18 DIAGNOSIS — G9341 Metabolic encephalopathy: Secondary | ICD-10-CM | POA: Diagnosis not present

## 2016-12-18 DIAGNOSIS — G8929 Other chronic pain: Secondary | ICD-10-CM | POA: Diagnosis present

## 2016-12-18 DIAGNOSIS — E1010 Type 1 diabetes mellitus with ketoacidosis without coma: Secondary | ICD-10-CM | POA: Diagnosis not present

## 2016-12-18 DIAGNOSIS — G9340 Encephalopathy, unspecified: Secondary | ICD-10-CM | POA: Diagnosis not present

## 2016-12-18 DIAGNOSIS — E875 Hyperkalemia: Secondary | ICD-10-CM | POA: Diagnosis not present

## 2016-12-18 DIAGNOSIS — R4182 Altered mental status, unspecified: Secondary | ICD-10-CM | POA: Diagnosis not present

## 2016-12-18 DIAGNOSIS — Z79899 Other long term (current) drug therapy: Secondary | ICD-10-CM

## 2016-12-18 DIAGNOSIS — E1110 Type 2 diabetes mellitus with ketoacidosis without coma: Secondary | ICD-10-CM | POA: Diagnosis not present

## 2016-12-18 DIAGNOSIS — G894 Chronic pain syndrome: Secondary | ICD-10-CM | POA: Diagnosis not present

## 2016-12-18 DIAGNOSIS — Z9114 Patient's other noncompliance with medication regimen: Secondary | ICD-10-CM

## 2016-12-18 DIAGNOSIS — Z7982 Long term (current) use of aspirin: Secondary | ICD-10-CM | POA: Diagnosis not present

## 2016-12-18 DIAGNOSIS — F419 Anxiety disorder, unspecified: Secondary | ICD-10-CM | POA: Diagnosis not present

## 2016-12-18 DIAGNOSIS — J449 Chronic obstructive pulmonary disease, unspecified: Secondary | ICD-10-CM | POA: Diagnosis not present

## 2016-12-18 DIAGNOSIS — E1042 Type 1 diabetes mellitus with diabetic polyneuropathy: Secondary | ICD-10-CM | POA: Diagnosis present

## 2016-12-18 DIAGNOSIS — S0990XA Unspecified injury of head, initial encounter: Secondary | ICD-10-CM | POA: Diagnosis not present

## 2016-12-18 DIAGNOSIS — M545 Low back pain: Secondary | ICD-10-CM | POA: Diagnosis not present

## 2016-12-18 DIAGNOSIS — I10 Essential (primary) hypertension: Secondary | ICD-10-CM | POA: Diagnosis present

## 2016-12-18 DIAGNOSIS — R7889 Finding of other specified substances, not normally found in blood: Secondary | ICD-10-CM | POA: Diagnosis not present

## 2016-12-18 DIAGNOSIS — N179 Acute kidney failure, unspecified: Secondary | ICD-10-CM | POA: Diagnosis not present

## 2016-12-18 DIAGNOSIS — R739 Hyperglycemia, unspecified: Secondary | ICD-10-CM | POA: Diagnosis not present

## 2016-12-18 DIAGNOSIS — E1142 Type 2 diabetes mellitus with diabetic polyneuropathy: Secondary | ICD-10-CM

## 2016-12-18 LAB — BASIC METABOLIC PANEL
ANION GAP: 17 — AB (ref 5–15)
ANION GAP: 10 (ref 5–15)
Anion gap: 25 — ABNORMAL HIGH (ref 5–15)
BUN: 42 mg/dL — AB (ref 6–20)
BUN: 36 mg/dL — ABNORMAL HIGH (ref 6–20)
BUN: 36 mg/dL — ABNORMAL HIGH (ref 6–20)
CALCIUM: 10.5 mg/dL — AB (ref 8.9–10.3)
CHLORIDE: 93 mmol/L — AB (ref 101–111)
CO2: 19 mmol/L — AB (ref 22–32)
CO2: 20 mmol/L — ABNORMAL LOW (ref 22–32)
CO2: 24 mmol/L (ref 22–32)
CREATININE: 1.65 mg/dL — AB (ref 0.61–1.24)
Calcium: 10.3 mg/dL (ref 8.9–10.3)
Calcium: 10.2 mg/dL (ref 8.9–10.3)
Chloride: 108 mmol/L (ref 101–111)
Chloride: 113 mmol/L — ABNORMAL HIGH (ref 101–111)
Creatinine, Ser: 1.4 mg/dL — ABNORMAL HIGH (ref 0.61–1.24)
Creatinine, Ser: 1.27 mg/dL — ABNORMAL HIGH (ref 0.61–1.24)
GFR calc Af Amer: 57 mL/min — ABNORMAL LOW (ref >60)
GFR calc non Af Amer: 40 mL/min — ABNORMAL LOW (ref >60)
GFR calc non Af Amer: 55 mL/min — ABNORMAL LOW (ref >60)
GFR, EST AFRICAN AMERICAN: 47 mL/min — AB (ref >60)
GFR, EST NON AFRICAN AMERICAN: 49 mL/min — AB (ref >60)
GLUCOSE: 469 mg/dL — AB (ref 65–99)
GLUCOSE: 275 mg/dL — AB (ref 65–99)
Glucose, Bld: 932 mg/dL (ref 65–99)
POTASSIUM: 4 mmol/L (ref 3.5–5.1)
POTASSIUM: 3.5 mmol/L (ref 3.5–5.1)
Potassium: 5.8 mmol/L — ABNORMAL HIGH (ref 3.5–5.1)
SODIUM: 137 mmol/L (ref 135–145)
Sodium: 145 mmol/L (ref 135–145)
Sodium: 147 mmol/L — ABNORMAL HIGH (ref 135–145)

## 2016-12-18 LAB — CBC
HCT: 41.4 % (ref 39.0–52.0)
Hemoglobin: 14.5 g/dL (ref 13.0–17.0)
MCH: 30.5 pg (ref 26.0–34.0)
MCHC: 35 g/dL (ref 30.0–36.0)
MCV: 87 fL (ref 78.0–100.0)
PLATELETS: 214 10*3/uL (ref 150–400)
RBC: 4.76 MIL/uL (ref 4.22–5.81)
RDW: 12.7 % (ref 11.5–15.5)
WBC: 10.1 10*3/uL (ref 4.0–10.5)

## 2016-12-18 LAB — URINALYSIS, ROUTINE W REFLEX MICROSCOPIC
Bacteria, UA: NONE SEEN
Bilirubin Urine: NEGATIVE
KETONES UR: 80 mg/dL — AB
Leukocytes, UA: NEGATIVE
Nitrite: NEGATIVE
Protein, ur: NEGATIVE mg/dL
SPECIFIC GRAVITY, URINE: 1.028 (ref 1.005–1.030)
SQUAMOUS EPITHELIAL / LPF: NONE SEEN
pH: 5 (ref 5.0–8.0)

## 2016-12-18 LAB — MRSA PCR SCREENING: MRSA BY PCR: NEGATIVE

## 2016-12-18 LAB — GLUCOSE, CAPILLARY
GLUCOSE-CAPILLARY: 527 mg/dL — AB (ref 65–99)
GLUCOSE-CAPILLARY: 289 mg/dL — AB (ref 65–99)
Glucose-Capillary: 245 mg/dL — ABNORMAL HIGH (ref 65–99)
Glucose-Capillary: 338 mg/dL — ABNORMAL HIGH (ref 65–99)
Glucose-Capillary: 575 mg/dL (ref 65–99)

## 2016-12-18 LAB — CBG MONITORING, ED
Glucose-Capillary: >600 mg/dL (ref 65–99)
Glucose-Capillary: >600 mg/dL (ref 65–99)

## 2016-12-18 MED ORDER — METHOCARBAMOL 500 MG PO TABS
500.0000 mg | ORAL_TABLET | Freq: Three times a day (TID) | ORAL | Status: DC
  Administered 2016-12-18 – 2016-12-20 (×5): 500 mg via ORAL
  Filled 2016-12-18 (×5): qty 1

## 2016-12-18 MED ORDER — SODIUM POLYSTYRENE SULFONATE 15 GM/60ML PO SUSP
15.0000 g | Freq: Once | ORAL | Status: AC
  Administered 2016-12-18: 15 g via ORAL
  Filled 2016-12-18: qty 60

## 2016-12-18 MED ORDER — ATORVASTATIN CALCIUM 40 MG PO TABS
40.0000 mg | ORAL_TABLET | Freq: Every evening | ORAL | Status: DC
Start: 2016-12-18 — End: 2016-12-20
  Administered 2016-12-18 – 2016-12-19 (×2): 40 mg via ORAL
  Filled 2016-12-18 (×2): qty 1

## 2016-12-18 MED ORDER — SODIUM CHLORIDE 0.9 % IV SOLN
INTRAVENOUS | Status: DC
  Administered 2016-12-18: 5.4 [IU]/h via INTRAVENOUS
  Filled 2016-12-18: qty 1

## 2016-12-18 MED ORDER — ORAL CARE MOUTH RINSE
15.0000 mL | Freq: Two times a day (BID) | OROMUCOSAL | Status: DC
  Administered 2016-12-19: 15 mL via OROMUCOSAL

## 2016-12-18 MED ORDER — CHLORHEXIDINE GLUCONATE 0.12 % MT SOLN
15.0000 mL | Freq: Two times a day (BID) | OROMUCOSAL | Status: DC
  Administered 2016-12-18 – 2016-12-20 (×4): 15 mL via OROMUCOSAL
  Filled 2016-12-18 (×4): qty 15

## 2016-12-18 MED ORDER — SODIUM CHLORIDE 0.9 % IV BOLUS (SEPSIS)
1000.0000 mL | Freq: Once | INTRAVENOUS | Status: AC
  Administered 2016-12-18: 1000 mL via INTRAVENOUS

## 2016-12-18 MED ORDER — GABAPENTIN 300 MG PO CAPS
600.0000 mg | ORAL_CAPSULE | Freq: Two times a day (BID) | ORAL | Status: DC
  Administered 2016-12-18 – 2016-12-20 (×4): 600 mg via ORAL
  Filled 2016-12-18 (×4): qty 2

## 2016-12-18 MED ORDER — SODIUM CHLORIDE 0.9 % IV SOLN
INTRAVENOUS | Status: DC
  Administered 2016-12-18: 20.6 [IU]/h via INTRAVENOUS
  Administered 2016-12-18: 13.9 [IU]/h via INTRAVENOUS
  Filled 2016-12-18 (×2): qty 1

## 2016-12-18 MED ORDER — DEXTROSE-NACL 5-0.45 % IV SOLN
INTRAVENOUS | Status: DC
  Administered 2016-12-18: via INTRAVENOUS

## 2016-12-18 MED ORDER — HEPARIN SODIUM (PORCINE) 5000 UNIT/ML IJ SOLN
5000.0000 [IU] | Freq: Three times a day (TID) | INTRAMUSCULAR | Status: DC
  Administered 2016-12-18 – 2016-12-20 (×5): 5000 [IU] via SUBCUTANEOUS
  Filled 2016-12-18 (×5): qty 1

## 2016-12-18 MED ORDER — BUSPIRONE HCL 10 MG PO TABS
30.0000 mg | ORAL_TABLET | Freq: Two times a day (BID) | ORAL | Status: DC
  Administered 2016-12-18 – 2016-12-20 (×4): 30 mg via ORAL
  Filled 2016-12-18 (×4): qty 3

## 2016-12-18 MED ORDER — METOPROLOL SUCCINATE ER 25 MG PO TB24
25.0000 mg | ORAL_TABLET | Freq: Every evening | ORAL | Status: DC
  Administered 2016-12-18 – 2016-12-19 (×2): 25 mg via ORAL
  Filled 2016-12-18 (×2): qty 1

## 2016-12-18 MED ORDER — GABAPENTIN 300 MG PO CAPS: 900.0000 mg | ORAL_CAPSULE | Freq: Three times a day (TID) | ORAL | Status: DC

## 2016-12-18 MED ORDER — DULOXETINE HCL 30 MG PO CPEP
60.0000 mg | ORAL_CAPSULE | Freq: Every day | ORAL | Status: DC
  Administered 2016-12-18 – 2016-12-20 (×3): 60 mg via ORAL
  Filled 2016-12-18 (×3): qty 2

## 2016-12-18 MED ORDER — SODIUM CHLORIDE 0.9 % IV SOLN: INTRAVENOUS | Status: DC

## 2016-12-18 MED ORDER — INSULIN GLARGINE 100 UNIT/ML ~~LOC~~ SOLN
15.0000 [IU] | Freq: Every day | SUBCUTANEOUS | Status: DC
  Administered 2016-12-19 (×2): 15 [IU] via SUBCUTANEOUS
  Filled 2016-12-18 (×3): qty 0.15

## 2016-12-18 MED ORDER — AMITRIPTYLINE HCL 10 MG PO TABS
10.0000 mg | ORAL_TABLET | Freq: Every day | ORAL | Status: DC
  Administered 2016-12-18 – 2016-12-19 (×2): 10 mg via ORAL
  Filled 2016-12-18 (×2): qty 1

## 2016-12-18 MED ORDER — SODIUM BICARBONATE 8.4 % IV SOLN
50.0000 meq | Freq: Once | INTRAVENOUS | Status: AC
  Administered 2016-12-18: 50 meq via INTRAVENOUS
  Filled 2016-12-18: qty 50

## 2016-12-18 MED ORDER — SODIUM CHLORIDE 0.9 % IV SOLN: INTRAVENOUS | Status: AC

## 2016-12-18 MED ORDER — ASPIRIN 81 MG PO CHEW: 81.0000 mg | CHEWABLE_TABLET | ORAL | Status: DC

## 2016-12-18 MED ORDER — SODIUM CHLORIDE 0.9 % IV SOLN
INTRAVENOUS | Status: DC
  Administered 2016-12-18: 20:00:00 via INTRAVENOUS

## 2016-12-18 MED ORDER — DEXTROSE-NACL 5-0.45 % IV SOLN: INTRAVENOUS | Status: DC

## 2016-12-18 MED ORDER — CALCIUM GLUCONATE 10 % IV SOLN: 1.0000 g | Freq: Once | INTRAVENOUS | Status: DC

## 2016-12-18 NOTE — ED Triage Notes (Addendum)
Per EMS, pt from home. Wife called due to AMS since waking this morning, was last seen normal last night. Pt is newly incontinent today. Pt afebrile. Pt has hx of diabetes. Pt's CBG 554. Pt has not taken his home insulin. Pt received 500cc NS en route to hospital. Pt is alert to person, place and situation. Pt was not alert to date.  BP 114/54 HR 98 O2 100% on RA

## 2016-12-18 NOTE — ED Notes (Signed)
Patient will be approved for 1238. We are getting a stat clean on that room and will approve patient as soon as it is ready. The nurse will be Zoe and her # is [phone]. If you would like to call report now and bring the patient when the room is clean that would be fine.

## 2016-12-18 NOTE — H&P (Addendum)
[facility] H&P   Patient Demographics:    Jeremy Gill, is a 71 y.o. male  MRN: [id number]   DOB - March 30, 1946  Admit Date - 12/18/2016  Outpatient Primary MD for the patient is SwazilandJordan, [name], MD  Referring MD/NP/PA: Erma HeritageKevin Steinhl  Outpatient Specialists:   Patient coming from: home  Chief Complaint  Patient presents with  . Altered Mental Status  . Hyperglycemia      HPI:    Jeremy MailJames Sison  is a 71 y.o. male, w dm2, hypertension, copd not on home o2, nicotine dep, apparently has been noncompliant w his medication due to disorientation by patient per his wife.    In ED, CT brain => negative, CXR pending . ua negative,  Blood sugar was 932.  Hco3 19 AG 25,  Pt will be admitted for DKA    Review of systems:    In addition to the HPI above,  No Fever-chills, No Headache, No changes with Vision or hearing, No problems swallowing food or Liquids, No Chest pain, Cough or Shortness of Breath, No Abdominal pain, No Nausea or Vommitting, Bowel movements are regular, No Blood in stool or Urine, No dysuria, No new skin rashes or bruises, No new joints pains-aches,  No new weakness, tingling, numbness in any extremity, No recent weight gain or loss, No polydypsia or polyphagia,  +polyuria No significant Mental Stressors.  A full 10 point Review of Systems was done, except as stated above, all other Review of Systems were negative.   With Past History of the following :    Past Medical History:  Diagnosis Date  . ANKLE SPRAIN 12/16/2006   Qualifier: Diagnosis of  By: Tawanna Coolerodd MD, Eugenio HoesJeffrey A   . ANXIETY 10/25/2006  . CELLULITIS, LEG, RIGHT 09/08/2007   Qualifier: Diagnosis of  By: Tawanna Coolerodd MD, Eugenio HoesJeffrey A   . Chronic pain disorder 11/30/2015  . COPD 10/25/2006  . Diabetes mellitus type 2 with neurological manifestations (HCC) 10/25/2006   Qualifier: Diagnosis of  By: Rosette Revealempleton, RN, Jorene MinorsAngela  Dawn   . DIABETES MELLITUS, TYPE I 10/25/2006  . Diabetic polyneuropathy (HCC) 04/08/2007   Qualifier: Diagnosis of  By: Tawanna Coolerodd MD, Eugenio HoesJeffrey A   . 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 Essential hypertension 10/25/2006   Qualifier: Diagnosis of  By: Rosette Revealempleton, RN, Jorene MinorsAngela Dawn   . Generalized osteoarthritis of multiple sites 11/30/2015  . HYPERTENSION 10/25/2006  . 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 Low back pain with radiation 01/23/2016  . Polyneuropathy in diabetes(357.2) 04/08/2007  . Psoriasis   . TOBACCO ABUSE 07/08/2007  . VENOUS INSUFFICIENCY 11/25/2007      Past Surgical History:  Procedure Laterality Date  . TESTICLE REMOVAL     R testicle      Social History:     Social History  Substance Use Topics  .  Smoking status: Current Every Day Smoker    Types: Pipe    Last attempt to quit: 05/29/2011  . Smokeless tobacco: Never Used  . Alcohol use No     Lives - at home   Mobility -   Walks with cane   Family History :     Family History  Problem Relation Age of Onset  . Cancer Mother        Unknown   . Diabetes Sister       Home Medications:   Prior to Admission medications   Medication Sig Start Date End Date Taking? Authorizing Provider  amitriptyline (ELAVIL) 10 MG tablet Take 1 tablet (10 mg total) by mouth at bedtime. 07/12/16  Yes Marcello Fennel, MD  aspirin 81 MG chewable tablet Chew 81 mg by mouth 2 (two) times a week. Pt takes on Monday and Friday.   Yes [provider]  atorvastatin (LIPITOR) 40 MG tablet Take 1 tablet (40 mg total) by mouth daily. Patient taking differently: Take 40 mg by mouth every evening.  11/26/16 02/24/17 Yes Dunn, Dayna N, PA-C  busPIRone (BUSPAR) 15 MG tablet Take 2 tablets (30 mg total) by mouth 2 (two) times daily. 02/08/16  Yes [place], [name], MD  DULoxetine (CYMBALTA) 60 MG capsule TAKE 1 CAPSULE BY MOUTH EVERY DAY 10/29/16  Yes Marcello Fennel, MD  fluocinonide cream  (LIDEX) 0.05 % Apply topically 3 (three) times daily as needed. For itching Patient taking differently: Apply 1 application topically 3 (three) times daily as needed (for itching).  12/23/14  Yes Roderick Pee, MD  gabapentin (NEURONTIN) 300 MG capsule TAKE 3 CAPSULES BY MOUTH 3 TIMES DAILY 12/13/16  Yes Marcello Fennel, MD  insulin glargine (LANTUS) 100 UNIT/ML injection Inject 0.25 mLs (25 Units total) into the skin at bedtime. 02/14/16  Yes [place], [name], MD  methocarbamol (ROBAXIN) 500 MG tablet TAKE 1/2 TABLET BY MOUTH EVERY 8 HOURS AS NEEDED FOR MUSCLE SPASMS 11/12/16  Yes Marcello Fennel, MD  metoprolol succinate (TOPROL-XL) 25 MG 24 hr tablet TAKE 1 TABLET BY MOUTH EVERY DAY Patient taking differently: Take 1 tablet by mouth every evening 12/14/16  Yes [place], [name], MD  naproxen (NAPROSYN) 500 MG tablet Take 1 tablet (500 mg total) by mouth 2 (two) times daily as needed. Patient taking differently: Take 500 mg by mouth 2 (two) times daily.  09/13/16  Yes Patel, Maryln Gottron, MD  NOVOLOG 100 UNIT/ML injection INJECT 15 UNITS SUBCUTANEOUSLY 3 TIMES DAILY BEFORE MEALS 11/09/16  Yes [place], [name], MD  traMADol (ULTRAM) 50 MG tablet Take 1 tablet (50 mg total) by mouth every 6 (six) hours as needed. Patient taking differently: Take 50 mg by mouth every 6 (six) hours as needed for moderate pain.  09/13/16  Yes Marcello Fennel, MD  triamcinolone ointment (KENALOG) 0.1 % Apply topically 2 (two) times daily. 1:1 compound with Eucerin Patient taking differently: Apply 1 application topically 2 (two) times daily. Compounded as a 1:1 mixture with Eucerin. 12/23/14  Yes Roderick Pee, MD  liraglutide (VICTOZA) 18 MG/3ML SOPN Inject 0.6 mg subcutaneously into the skin daily, increase to 1.2 mg in a week if tolerated. 11/19/16   [place], [name], MD     Allergies:    No Known Allergies   Physical Exam:   Vitals  Blood pressure 135/77, pulse 95, temperature 98.1 F (36.7 C), temperature source  Oral, resp. rate 18, SpO2 100 %.   1. General  lying in bed in NAD,    2. Normal affect and insight, Not Suicidal or Homicidal, Awake Alert, Oriented X 3.  3. No F.N deficits, ALL C.Nerves Intact, Strength 5/5 all 4 extremities, Sensation intact all 4 extremities, Plantars down going.  4. Ears and Eyes appear Normal, Conjunctivae clear, PERRLA. Moist Oral Mucosa.  5. Supple Neck, No JVD, No cervical lymphadenopathy appriciated, No Carotid Bruits.  6. Symmetrical Chest wall movement, Good air movement bilaterally, CTAB.  7. RRR, No Gallops, Rubs or Murmurs, No Parasternal Heave.  8. Positive Bowel Sounds, Abdomen Soft, No tenderness, No organomegaly appriciated,No rebound -guarding or rigidity.  9.  No Cyanosis, Normal Skin Turgor, No Skin Rash or Bruise.  10. Good muscle tone,  joints appear normal , no effusions, Normal ROM.  11. No Palpable Lymph Nodes in Neck or Axillae     Data Review:    CBC  Recent Labs Lab 12/18/16 1400  WBC 10.1  HGB 14.5  HCT 41.4  PLT 214  MCV 87.0  MCH 30.5  MCHC 35.0  RDW 12.7   ------------------------------------------------------------------------------------------------------------------  Chemistries   Recent Labs Lab 12/18/16 1400  NA 137  K 5.8*  CL 93*  CO2 19*  GLUCOSE 932*  BUN 42*  CREATININE 1.65*  CALCIUM 10.5*   ------------------------------------------------------------------------------------------------------------------ CrCl cannot be calculated (Unknown ideal weight.). ------------------------------------------------------------------------------------------------------------------ No results for input(s): TSH, T4TOTAL, T3FREE, THYROIDAB in the last 72 hours.  Invalid input(s): FREET3  Coagulation profile No results for input(s): INR, PROTIME in the last 168 hours. ------------------------------------------------------------------------------------------------------------------- No results for  input(s): DDIMER in the last 72 hours. -------------------------------------------------------------------------------------------------------------------  Cardiac Enzymes No results for input(s): CKMB, TROPONINI, MYOGLOBIN in the last 168 hours.  Invalid input(s): CK ------------------------------------------------------------------------------------------------------------------ No results found for: BNP   ---------------------------------------------------------------------------------------------------------------  Urinalysis    Component Value Date/Time   COLORURINE STRAW (A) 12/18/2016 1356   APPEARANCEUR CLEAR 12/18/2016 1356   LABSPEC 1.028 12/18/2016 1356   PHURINE 5.0 12/18/2016 1356   GLUCOSEU >=500 (A) 12/18/2016 1356   GLUCOSEU NEGATIVE 09/30/2008 0851   HGBUR SMALL (A) 12/18/2016 1356   HGBUR trace-lysed 03/29/2010 0940   BILIRUBINUR NEGATIVE 12/18/2016 1356   BILIRUBINUR neg 01/13/2014 1141   KETONESUR 80 (A) 12/18/2016 1356   PROTEINUR NEGATIVE 12/18/2016 1356   UROBILINOGEN 1.0 01/13/2014 1141   UROBILINOGEN 0.2 03/29/2010 0940   NITRITE NEGATIVE 12/18/2016 1356   LEUKOCYTESUR NEGATIVE 12/18/2016 1356    ----------------------------------------------------------------------------------------------------------------   Imaging Results:    Ct Head Wo Contrast  Result Date: 12/18/2016 CLINICAL DATA:  Altered mental status, recent fall this morning, diabetes EXAM: CT HEAD WITHOUT CONTRAST TECHNIQUE: Contiguous axial images were obtained from the base of the skull through the vertex without intravenous contrast. COMPARISON:  None available FINDINGS: Brain: Brain atrophy evident with advanced chronic white matter microvascular ischemic changes throughout the cerebral hemispheres bilaterally. No acute intracranial hemorrhage, mass lesion, definite new infarction, midline shift, herniation, hydrocephalus, or extra-axial fluid collection. No focal mass effect or edema.  Cisterns are patent. Cerebellar atrophy as well. Vascular: Atherosclerosis of the intracranial vessels at the skullbase. No hyperdense vessel. Skull: Normal. Negative for fracture or focal lesion. Sinuses/Orbits: No acute finding. Other: None. IMPRESSION: Atrophy and advanced chronic white matter microvascular changes. No acute intracranial abnormality by noncontrast CT. Electronically Signed   By: Judie Petit.  Shick M.D.   On: 12/18/2016 16:05      Assessment & Plan:    Active Problems:   Essential hypertension   Chronic pain disorder   DKA (diabetic ketoacidoses) (  HCC)   Hyperkalemia   ARF (acute renal failure) (HCC)    DKA Ns iv Insulin iv  ARF secondary to dehydration STOP NAPROXEN Ns iv If persistent consider renal ultrasound, urine sodium, urine creatinine, urine eosinophils  Hyperkalemia Calcium gluconate,  Sodium bicarbonate Kayexalate  Ns iv  Chronic pain, continue current pain medications.   Copd Pt not currently on inhalers  Nicotine dep Nicotine patch offerred and declined  DVT Prophylaxis Heparin -  SCDs  AM Labs Ordered, also please review Full Orders  Family Communication: Admission, patients condition and plan of care including tests being ordered have been discussed with the patient  who indicate understanding and agree with the plan and Code Status.  Code Status FULL CODE  Likely DC to  home  Condition GUARDED   Consults called: none  Admission status: inpatient   Time spent in minutes : 45 critical; care   Pearson Grippe M.D on 12/18/2016 at 4:54 PM  Between 7am to 7pm - Pager - [phone] After 7pm go to [email or web address] - password [facility]  [facility] - Office  [phone]

## 2016-12-18 NOTE — ED Provider Notes (Signed)
[facility]-EMERGENCY DEPT Provider Note   CSN: [id number] Arrival date & time: 12/18/16  1338     History   Chief Complaint Chief Complaint  Patient presents with  . Altered Mental Status  . Hyperglycemia    HPI Jeremy Gill is a 71 y.o. male.   Past medical history of poorly controlled diabetes, medical noncompliance, chronic back pain who presents emergency department for hyperglycemia and altered mental status. His wife states that he has been poorly compliant with his medications and has had frequent urination. No blood. Patient fell hitting his head in the gravel parking lot. He seemed appropriate up until this morning when his wife tried to wake him up and he seemed extremely confused and lethargic. He has not had other neuro deficits. The patient denies fevers or chills.  HPI  Past Medical History:  Diagnosis Date  . ANKLE SPRAIN 12/16/2006   Qualifier: Diagnosis of  By: Tawanna Cooler MD, Eugenio Hoes   . ANXIETY 10/25/2006  . CELLULITIS, LEG, RIGHT 09/08/2007   Qualifier: Diagnosis of  By: Tawanna Cooler MD, Eugenio Hoes   . Chronic pain disorder 11/30/2015  . COPD 10/25/2006  . 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 DIABETES MELLITUS, TYPE I 10/25/2006  . Diabetic polyneuropathy (HCC) 04/08/2007   Qualifier: Diagnosis of  By: Tawanna Cooler MD, Eugenio Hoes   . 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 Essential hypertension 10/25/2006   Qualifier: Diagnosis of  By: Rosette Reveal RN, Jorene Minors   . Generalized osteoarthritis of multiple sites 11/30/2015  . HYPERTENSION 10/25/2006  . 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 Low back pain with radiation 01/23/2016  . Polyneuropathy in diabetes(357.2) 04/08/2007  . Psoriasis   . TOBACCO ABUSE 07/08/2007  . VENOUS INSUFFICIENCY 11/25/2007    Patient Active Problem List   Diagnosis Date Noted  . Anxiety disorder, unspecified  02/08/2016  . Low back pain with radiation 01/23/2016  . Chronic pain disorder 11/30/2015  . Generalized osteoarthritis of multiple sites 11/30/2015  . DYSHIDROSIS 06/08/2008  . VENOUS INSUFFICIENCY 11/25/2007  . 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 TOBACCO ABUSE 07/08/2007  . Diabetic polyneuropathy (HCC) 04/08/2007  . 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 Essential hypertension 10/25/2006  . COPD 10/25/2006    Past Surgical History:  Procedure Laterality Date  . TESTICLE REMOVAL     R testicle       Home Medications    Prior to Admission medications   Medication Sig Start Date End Date Taking? Authorizing Provider  amitriptyline (ELAVIL) 10 MG tablet Take 1 tablet (10 mg total) by mouth at bedtime. 07/12/16  Yes Marcello Fennel, MD  aspirin 81 MG chewable tablet Chew 81 mg by mouth 2 (two) times a week. Pt takes on Monday and Friday.   Yes [provider]  atorvastatin (LIPITOR) 40 MG tablet Take 1 tablet (40 mg total) by mouth daily. Patient taking differently: Take 40 mg by mouth every evening.  11/26/16 02/24/17 Yes Dunn, Dayna N, PA-C  busPIRone (BUSPAR) 15 MG tablet Take 2 tablets (30 mg total) by mouth 2 (two) times daily. 02/08/16  Yes [place], [name], MD  DULoxetine (CYMBALTA) 60 MG capsule TAKE 1 CAPSULE BY MOUTH EVERY DAY 10/29/16  Yes Marcello Fennel, MD  fluocinonide cream (LIDEX) 0.05 % Apply topically 3 (three) times daily as  needed. For itching Patient taking differently: Apply 1 application topically 3 (three) times daily as needed (for itching).  12/23/14  Yes Roderick Peeodd, Jeffrey A, MD  gabapentin (NEURONTIN) 300 MG capsule TAKE 3 CAPSULES BY MOUTH 3 TIMES DAILY 12/13/16  Yes Marcello FennelPatel, Ankit Anil, MD  insulin glargine (LANTUS) 100 UNIT/ML injection Inject 0.25 mLs (25 Units total) into the skin at bedtime. 02/14/16  Yes [place], [name], MD  methocarbamol (ROBAXIN) 500 MG tablet TAKE 1/2 TABLET BY MOUTH EVERY 8 HOURS AS  NEEDED FOR MUSCLE SPASMS 11/12/16  Yes Marcello FennelPatel, Ankit Anil, MD  metoprolol succinate (TOPROL-XL) 25 MG 24 hr tablet TAKE 1 TABLET BY MOUTH EVERY DAY Patient taking differently: Take 1 tablet by mouth every evening 12/14/16  Yes [place], [name], MD  naproxen (NAPROSYN) 500 MG tablet Take 1 tablet (500 mg total) by mouth 2 (two) times daily as needed. Patient taking differently: Take 500 mg by mouth 2 (two) times daily.  09/13/16  Yes Patel, Maryln GottronAnkit Anil, MD  NOVOLOG 100 UNIT/ML injection INJECT 15 UNITS SUBCUTANEOUSLY 3 TIMES DAILY BEFORE MEALS 11/09/16  Yes [place], [name], MD  traMADol (ULTRAM) 50 MG tablet Take 1 tablet (50 mg total) by mouth every 6 (six) hours as needed. Patient taking differently: Take 50 mg by mouth every 6 (six) hours as needed for moderate pain.  09/13/16  Yes Marcello FennelPatel, Ankit Anil, MD  triamcinolone ointment (KENALOG) 0.1 % Apply topically 2 (two) times daily. 1:1 compound with Eucerin Patient taking differently: Apply 1 application topically 2 (two) times daily. Compounded as a 1:1 mixture with Eucerin. 12/23/14  Yes Roderick Peeodd, Jeffrey A, MD  liraglutide (VICTOZA) 18 MG/3ML SOPN Inject 0.6 mg subcutaneously into the skin daily, increase to 1.2 mg in a week if tolerated. 11/19/16   [place], [name], MD    Family History Family History  Problem Relation Age of Onset  . Cancer Mother        Unknown   . Diabetes Sister     Social History Social History  Substance Use Topics  . Smoking status: Current Every Day Smoker    Types: Pipe    Last attempt to quit: 05/29/2011  . Smokeless tobacco: Never Used  . Alcohol use No     Allergies   Patient has no known allergies.   Review of Systems Review of Systems  Unable to perform ROS: Mental status change     Physical Exam Updated Vital Signs BP 135/77   Pulse 95   Temp 98.1 F (36.7 C) (Oral)   Resp 18   SpO2 100%   Physical Exam  Constitutional: He appears well-developed and well-nourished. No distress.  HENT:  Head:  Normocephalic and atraumatic.  Scratches to the R forehead  Eyes: Pupils are equal, round, and reactive to light. Conjunctivae and EOM are normal. No scleral icterus.  Neck: Normal range of motion. Neck supple.  Cardiovascular: Normal rate, regular rhythm and normal heart sounds.   Pulmonary/Chest: Effort normal and breath sounds normal. No respiratory distress.  Abdominal: Soft. There is no tenderness.  Musculoskeletal: He exhibits no edema.  Neurological: He is alert. GCS eye subscore is 4. GCS verbal subscore is 4. GCS motor subscore is 6.  Oriented to self only  Skin: Skin is warm and dry. He is not diaphoretic.  Psychiatric: His behavior is normal.  Nursing note and vitals reviewed.    ED Treatments / Results  Labs (all labs ordered are listed, but only abnormal results are displayed) Labs Reviewed  BASIC METABOLIC PANEL - Abnormal; Notable for the following:       Result Value   Potassium 5.8 (*)    Chloride 93 (*)    CO2 19 (*)    Glucose, Bld 932 (*)    BUN 42 (*)    Creatinine, Ser 1.65 (*)    Calcium 10.5 (*)    GFR calc non Af Amer 40 (*)    GFR calc Af Amer 47 (*)    Anion gap 25 (*)    All other components within normal limits  URINALYSIS, ROUTINE W REFLEX MICROSCOPIC - Abnormal; Notable for the following:    Color, Urine STRAW (*)    Glucose, UA >=500 (*)    Hgb urine dipstick SMALL (*)    Ketones, ur 80 (*)    All other components within normal limits  CBG MONITORING, ED - Abnormal; Notable for the following:    Glucose-Capillary >600 (*)    All other components within normal limits  CBC    EKG  EKG Interpretation None       Radiology No results found.  Procedures .Critical Care Performed by: Arthor Captain Authorized by: Arthor Captain   Critical care provider statement:    Critical care time (minutes):  60   Critical care time was exclusive of:  Separately billable procedures and treating other patients   Critical care was necessary to  treat or prevent imminent or life-threatening deterioration of the following conditions:  Metabolic crisis   Critical care was time spent personally by me on the following activities:  Ordering and performing treatments and interventions, ordering and review of laboratory studies, ordering and review of radiographic studies, pulse oximetry, re-evaluation of patient's condition, review of old charts, obtaining history from patient or surrogate, interpretation of cardiac output measurements, evaluation of patient's response to treatment, discussions with consultants, development of treatment plan with patient or surrogate and examination of patient   (including critical care time)  Medications Ordered in ED Medications  dextrose 5 %-0.45 % sodium chloride infusion (not administered)  insulin regular (NOVOLIN R,HUMULIN R) 100 Units in sodium chloride 0.9 % 100 mL (1 Units/mL) infusion (not administered)  sodium chloride 0.9 % bolus 1,000 mL (not administered)    And  sodium chloride 0.9 % bolus 1,000 mL (not administered)    And  0.9 %  sodium chloride infusion (not administered)     Initial Impression / Assessment and Plan / ED Course  I have reviewed the triage vital signs and the nursing notes.  Pertinent labs & imaging results that were available during my care of the patient were reviewed by me and considered in my medical decision making (see chart for details).  Clinical Course as of Dec 30 1609  Tue Dec 18, 2016  1624 Patient with with DKA and acute encephalopathy. No evidence of Intracranial abnormality. Potassium is slightly elevated  [AH]    Clinical Course User Index [AH] Arthor Captain, PA-C      Patient with DKA Encephalopathic. No Evidence of acute ischemia or infection. Patient will be admitted for metabolic crisis. Final Clinical Impressions(s) / ED Diagnoses   Final diagnoses:  Diabetic ketoacidosis without coma associated with type 2 diabetes mellitus (HCC)    Acute encephalopathy    New Prescriptions New Prescriptions   No medications on file     Arthor Captain, PA-C 12/30/16 1612    Cathren Laine, MD 01/01/17 [phone]

## 2016-12-19 DIAGNOSIS — G9341 Metabolic encephalopathy: Secondary | ICD-10-CM

## 2016-12-19 DIAGNOSIS — N179 Acute kidney failure, unspecified: Secondary | ICD-10-CM

## 2016-12-19 LAB — BASIC METABOLIC PANEL
ANION GAP: 9 (ref 5–15)
ANION GAP: 13 (ref 5–15)
BUN: 34 mg/dL — ABNORMAL HIGH (ref 6–20)
BUN: 35 mg/dL — ABNORMAL HIGH (ref 6–20)
CALCIUM: 10 mg/dL (ref 8.9–10.3)
CALCIUM: 9.9 mg/dL (ref 8.9–10.3)
CO2: 24 mmol/L (ref 22–32)
CO2: 22 mmol/L (ref 22–32)
Chloride: 116 mmol/L — ABNORMAL HIGH (ref 101–111)
Chloride: 112 mmol/L — ABNORMAL HIGH (ref 101–111)
Creatinine, Ser: 1.03 mg/dL (ref 0.61–1.24)
Creatinine, Ser: 1.08 mg/dL (ref 0.61–1.24)
GFR calc non Af Amer: >60 mL/min (ref >60)
GLUCOSE: 104 mg/dL — AB (ref 65–99)
GLUCOSE: 155 mg/dL — AB (ref 65–99)
POTASSIUM: 3.7 mmol/L (ref 3.5–5.1)
POTASSIUM: 3.8 mmol/L (ref 3.5–5.1)
Sodium: 149 mmol/L — ABNORMAL HIGH (ref 135–145)
Sodium: 147 mmol/L — ABNORMAL HIGH (ref 135–145)

## 2016-12-19 LAB — GLUCOSE, CAPILLARY
GLUCOSE-CAPILLARY: 106 mg/dL — AB (ref 65–99)
GLUCOSE-CAPILLARY: 192 mg/dL — AB (ref 65–99)
GLUCOSE-CAPILLARY: 229 mg/dL — AB (ref 65–99)
GLUCOSE-CAPILLARY: 91 mg/dL (ref 65–99)
Glucose-Capillary: 265 mg/dL — ABNORMAL HIGH (ref 65–99)
Glucose-Capillary: 113 mg/dL — ABNORMAL HIGH (ref 65–99)
Glucose-Capillary: 90 mg/dL (ref 65–99)
Glucose-Capillary: 129 mg/dL — ABNORMAL HIGH (ref 65–99)
Glucose-Capillary: 231 mg/dL — ABNORMAL HIGH (ref 65–99)
Glucose-Capillary: 321 mg/dL — ABNORMAL HIGH (ref 65–99)

## 2016-12-19 MED ORDER — INSULIN ASPART 100 UNIT/ML ~~LOC~~ SOLN
0.0000 [IU] | Freq: Three times a day (TID) | SUBCUTANEOUS | Status: DC
  Administered 2016-12-19: 2 [IU] via SUBCUTANEOUS
  Administered 2016-12-19 (×2): 5 [IU] via SUBCUTANEOUS
  Administered 2016-12-20: 3 [IU] via SUBCUTANEOUS
  Administered 2016-12-20: 5 [IU] via SUBCUTANEOUS

## 2016-12-19 MED ORDER — NYSTATIN 100000 UNIT/ML MT SUSP
5.0000 mL | Freq: Four times a day (QID) | OROMUCOSAL | Status: DC
  Administered 2016-12-19 – 2016-12-20 (×5): 500000 [IU] via ORAL
  Filled 2016-12-19 (×4): qty 5

## 2016-12-19 MED ORDER — INSULIN ASPART 100 UNIT/ML ~~LOC~~ SOLN
0.0000 [IU] | Freq: Every day | SUBCUTANEOUS | Status: DC
  Administered 2016-12-19: 4 [IU] via SUBCUTANEOUS

## 2016-12-19 NOTE — Evaluation (Signed)
Physical Therapy Evaluation Patient Details Name: Jeremy Gill MRN: [id number] DOB: 1945/11/07 Today's Date: 12/19/2016   History of Present Illness  71 yo male admitted with DKA, ARF, AMS. Hx of DM, HTN, COPD, chronic pain, neuropathy, noncompliance  Clinical Impression  On eval, pt required Mod assist for bed mobility and Min assist for standing and short distance ambulation. It took pt 15 minutes to walk 15 feet with a RW. L LE buckled x 1-pt stated he had a sharp pain in his hip. Pt presents with general weakness, decreased activity tolerance, and impaired gait and balance. Wife was present at start of eval but she did not stay to observe session. D/C plan is dependent upon level of care/assist that wife feels comfortable providing.     Follow Up Recommendations Home health PT;Supervision/Assistance - 24 hour (as long as wife feels she can manage with pt safely at home. If not, may need to consider SNF. )    Equipment Recommendations  Rolling walker with 5" wheels    Recommendations for Other Services       Precautions / Restrictions Precautions Precautions: Fall Restrictions Weight Bearing Restrictions: No      Mobility  Bed Mobility Overal bed mobility: Needs Assistance Bed Mobility: Supine to Sit     Supine to sit: Mod assist;HOB elevated     General bed mobility comments: Assist for trunk and to scoot to EOB. Utilized bedpad. Increased time. Cues for technique. Level of assist possibly due to lack of motivation  Transfers Overall transfer level: Needs assistance Equipment used: Rolling walker (2 wheeled) Transfers: Sit to/from Stand Sit to Stand: Min assist;From elevated surface         General transfer comment: Assist to rise, stabilize, control descent. VCs safety, technique, hand placement. Increased time.   Ambulation/Gait Ambulation/Gait assistance: Min assist Ambulation Distance (Feet): 15 Feet   Gait Pattern/deviations: Step-to pattern;Decreased  stride length;Decreased step length - right;Decreased step length - left     General Gait Details: L LE buckled x1. Pt took 15 mintues to walk 15 feet in room. Pt declined hallway ambulation.  Stairs            Wheelchair Mobility    Modified Rankin (Stroke Patients Only)       Balance Overall balance assessment: Needs assistance         Standing balance support: Bilateral upper extremity supported Standing balance-Leahy Scale: Poor Standing balance comment: required RW on today                             Pertinent Vitals/Pain Pain Assessment: Faces Faces Pain Scale: Hurts little more Pain Location: L hip with ambulation Pain Descriptors / Indicators: Sharp Pain Intervention(s): Monitored during session;Limited activity within patient's tolerance;Repositioned    Home Living Family/patient expects to be discharged to:: Private residence Living Arrangements: Spouse/significant other Available Help at Discharge: Family Type of Home: House Home Access: Stairs to enter Entrance Stairs-Rails: None Secretary/administratorntrance Stairs-Number of Steps: 2 Home Layout: One level Home Equipment: Cane - single point      Prior Function Level of Independence: Independent with assistive device(s)         Comments: mod ind with cane     Hand Dominance        Extremity/Trunk Assessment   Upper Extremity Assessment Upper Extremity Assessment: Generalized weakness    Lower Extremity Assessment Lower Extremity Assessment: Generalized weakness    Cervical / Trunk Assessment  Cervical / Trunk Assessment: Kyphotic  Communication   Communication: No difficulties  Cognition Arousal/Alertness: Awake/alert Behavior During Therapy: Flat affect Overall Cognitive Status: Impaired/Different from baseline                                 General Comments: pt had difficulty providing home environment info.       General Comments      Exercises      Assessment/Plan    PT Assessment Patient needs continued PT services  PT Problem List Decreased strength;Decreased mobility;Decreased activity tolerance;Decreased balance;Decreased knowledge of use of DME;Decreased cognition;Pain       PT Treatment Interventions DME instruction;Gait training;Therapeutic activities;Therapeutic exercise;Patient/family education;Balance training;Functional mobility training    PT Goals (Current goals can be found in the Care Plan section)  Acute Rehab PT Goals Patient Stated Goal: none stated PT Goal Formulation: With patient/family Time For Goal Achievement: 01/02/17 Potential to Achieve Goals: Good    Frequency Min 3X/week   Barriers to discharge        Co-evaluation               AM-PAC PT "6 Clicks" Daily Activity  Outcome Measure Difficulty turning over in bed (including adjusting bedclothes, sheets and blankets)?: Total Difficulty moving from lying on back to sitting on the side of the bed? : Total Difficulty sitting down on and standing up from a chair with arms (e.g., wheelchair, bedside commode, etc,.)?: Total Help needed moving to and from a bed to chair (including a wheelchair)?: A Little Help needed walking in hospital room?: A Little Help needed climbing 3-5 steps with a railing? : A Lot 6 Click Score: 11    End of Session Equipment Utilized During Treatment: Gait belt Activity Tolerance: Patient limited by fatigue (Limited by lack of motivation) Patient left: in chair;with call bell/phone within reach;with family/visitor present Nurse Communication:  (made NT aware pt did not have a chair alarm pad under him in recliner) PT Visit Diagnosis: Muscle weakness (generalized) (M62.81);Difficulty in walking, not elsewhere classified (R26.2)    Time: 1420-1500 PT Time Calculation (min) (ACUTE ONLY): 40 min   Charges:   PT Evaluation $PT Eval Low Complexity: 1 Procedure PT Treatments $Gait Training: 8-22 mins $Therapeutic  Activity: 8-22 mins   PT G Codes:          Rebeca AlertJannie Mehar Kirkwood, MPT Pager: [phone]

## 2016-12-19 NOTE — Progress Notes (Signed)
Took report from GrenadaBrittany F. RN. Assessment was unchanged from previous assessment

## 2016-12-19 NOTE — Progress Notes (Signed)
PROGRESS NOTE  Jeremy SalinesJames E Stanbery UJW:[id number] DOB: 07-Jan-1946 DOA: 12/18/2016 PCP: [place], [name], MD  Brief Narrative: 71 year old man PMH diabetes mellitus type 2, noncompliance with medications, presented with altered mental status. Admitted for DKA, acute kidney injury, hyperkalemia.  Assessment/Plan #1 DKA secondary to noncompliance with medications, superimposed on diabetes mellitus type 2 with polyneuropathy. -DKA resolved with insulin infusion. -Subcutaneous insulin, follow blood sugars closely  #2 Acute metabolic encephalopathy secondary to severe hyperglycemia, DKA. CT head negative. -Improving but not resolved. Intermittently disoriented, speech intermittently slurred. No focal neurologic deficits. History and exam do not suggest acute neurologic event. Expect will spontaneously resolve as condition improves.  #3 Acute kidney injury secondary to hyperglycemia, dehydration -Resolved with IV fluids  #4 Hyperkalemia secondary to acute kidney injury -Resolved with IV fluids  #5 Irregular rhythm. -Asymptomatic. Check EKG.  #6 COPD  -asymptomatic   #7 Chronic back pain -stable    Overall improved, plan transfer to medical floor, discontinue telemetry. Follow blood sugars closely. Expect spontaneous resolution of acute encephalopathy. If fails to improve, consider further workup.   PT consult  DVT prophylaxis: heparin Code Status: full Family Communication: wife at bedside Disposition Plan: home 7/26?    Brendia Sacksaniel Jaydalynn Olivero, MD  [facility] Direct contact: [phone] --Via amion app OR  --[email or web address]; password [email or web address]  7PM-7AM contact night coverage as above 12/19/2016, 9:29 AM  LOS: 1 day   Consultants:    Procedures:    Antimicrobials:    Interval history/Subjective: Feels better but still confused per wife. No nausea or vomiting. Usually does not eat breakfast. Patient does not feel well but has no specific complaints.  Objective: Vitals:  Afebrile, 99.2, 18, 81, 138/66, 100% on room air  Exam:     Constitutional: Appears calm, comfortable.  Eyes. Pupils, irises, lids appear unremarkable.  ENT. Poor dentition. Lips and tongue appear unremarkable.  Respiratory. Clear to auscultation bilaterally. No wheezes, rales or rhonchi. Normal respiratory effort.  Cardiovascular. Irregular rhythm, normal rate, no murmur, rub or gallop. No lower extremity edema.  Abdomen soft, nontender, nondistended.  Skin. No rash or induration noted. Nontender to palpation.  Musculoskeletal. Moves all extremities to command. No focal strength deficit noted.  Psychiatric. Alert, oriented to self. Not location, month or year. Speech intermittently dysarthric.   I have personally reviewed the following:   Labs:  Potassium within normal limits. BUN trending down, 34. Creatinine has normalized, 1.03. Anion gap within normal limits. Fasting blood sugar 104.  Blood sugars well controlled over the last 6 hours  Imaging studies:  CT head no acute intracranial abnormality  Medical tests:     Test discussed with performing physician:    Decision to obtain old records:    Review and summation of old records:    Scheduled Meds: . amitriptyline  10 mg Oral QHS  . [START ON 12/21/2016] aspirin  81 mg Oral Once per day on Mon Fri  . atorvastatin  40 mg Oral QPM  . busPIRone  30 mg Oral BID  . chlorhexidine  15 mL Mouth Rinse BID  . DULoxetine  60 mg Oral Daily  . gabapentin  600 mg Oral BID  . heparin  5,000 Units Subcutaneous Q8H  . insulin aspart  0-15 Units Subcutaneous TID WC  . insulin aspart  0-5 Units Subcutaneous QHS  . insulin glargine  15 Units Subcutaneous Q2200  . mouth rinse  15 mL Mouth Rinse q12n4p  . methocarbamol  500 mg Oral TID  . metoprolol succinate  25  mg Oral QPM   Continuous Infusions:  Principal Problem:   DKA (diabetic ketoacidoses) (HCC) Active Problems:   Chronic pain disorder   Hyperkalemia    AKI (acute kidney injury) (HCC)   Acute metabolic encephalopathy   LOS: 1 day

## 2016-12-19 NOTE — Care Management Note (Signed)
Case Management Note  Patient Details  Name: Lazarus SalinesJames E Brant MRN: [id number] Date of Birth: February 13, 1946  Subjective/Objective:     ams with aki               Action/Plan: Date:  December 19, 2016 Chart reviewed for concurrent status and case management needs. Will continue to follow patient progress. Discharge Planning: following for needs Expected discharge date: [id number] Marcelle SmilingRhonda Davis, BSN, [place] LakeRN3, [place]   [phone]  Expected Discharge Date:   (unknown)               Expected Discharge Plan:  Home/Self Care  In-House Referral:     Discharge planning Services  CM Consult  Post Acute Care Choice:    Choice offered to:     DME Arranged:    DME Agency:     HH Arranged:    HH Agency:     Status of Service:  In process, will continue to follow  If discussed at Long Length of Stay Meetings, dates discussed:    Additional Comments:  Golda AcreDavis, Rhonda Lynn, RN 12/19/2016, 8:53 AM

## 2016-12-19 NOTE — Progress Notes (Signed)
Pt arrived to the floor via bed with nursing staff and family present. Pt denies pain at this time with no s/s of distress noted. This Rn reviewed and agrees with the morning assessment. Will continue to monitor.

## 2016-12-19 NOTE — Progress Notes (Signed)
Patient's BMET values were within parameters and nurse was instructed to administer lantus and to inform the provider of the the 2nd hourly CBG following administration. When the nurse notified for the second following CBG of 113, the nurse was informed that since there were not two CBGs in range before giving the lantus, that the patient is to stay on the insulin gtt until there are 3 more consecutive CBGs within range. Patient is sleeping and VSS. Nurse will continue to monitor.

## 2016-12-20 ENCOUNTER — Other Ambulatory Visit: Payer: Self-pay | Admitting: Family Medicine

## 2016-12-20 DIAGNOSIS — E1110 Type 2 diabetes mellitus with ketoacidosis without coma: Secondary | ICD-10-CM

## 2016-12-20 DIAGNOSIS — N179 Acute kidney failure, unspecified: Secondary | ICD-10-CM

## 2016-12-20 DIAGNOSIS — E875 Hyperkalemia: Secondary | ICD-10-CM

## 2016-12-20 DIAGNOSIS — G894 Chronic pain syndrome: Secondary | ICD-10-CM

## 2016-12-20 DIAGNOSIS — B370 Candidal stomatitis: Secondary | ICD-10-CM

## 2016-12-20 DIAGNOSIS — G9341 Metabolic encephalopathy: Secondary | ICD-10-CM

## 2016-12-20 LAB — GLUCOSE, CAPILLARY
GLUCOSE-CAPILLARY: 217 mg/dL — AB (ref 65–99)
GLUCOSE-CAPILLARY: 200 mg/dL — AB (ref 65–99)

## 2016-12-20 NOTE — Consult Note (Addendum)
    [facility] LPHN CM Inpatient Consult   12/20/2016  Jeremy SalinesJames E Gill 05-28-1946 [id number]   Spoke with Jeremy Gill and wife prior to hospital discharge [facility] on behalf of his [employer] program.  Denies having any transportation, medication, disease management for DM, or community resource needs at this time.  However, Jeremy Gill is agreeable to post general EMMI discharge telephone calls.  Provided [facility] broch[facility] and 24-hr nurse line magnet.  Appreciative of visit.  Referral made to [facility] office to be assigned for EMMI transition calls.    Raiford NobleAtika Hall, MSN-Ed, RN,BSN [facility] Hospital Liaison [phone]

## 2016-12-20 NOTE — Progress Notes (Signed)
Pts IV removed with a clean and dry dressing intact. Pt denies pain at the time of discharge with no s/s of distress noted. Pt taken to the front entrance via wheelchair with nursing staff and family present.

## 2016-12-20 NOTE — Progress Notes (Signed)
Inpatient Diabetes Program Recommendations  AACE/ADA: New Consensus Statement on Inpatient Glycemic Control (2015)  Target Ranges:  Prepandial:   less than 140 mg/dL      Peak postprandial:   less than 180 mg/dL (1-2 hours)      Critically ill patients:  140 - 180 mg/dL   Lab Results  Component Value Date   GLUCAP 217 (H) 12/20/2016   HGBA1C 14.1 (H) 11/13/2016    Review of Glycemic Control  Post-prandial blood sugars elevated. Needs meal coverage insulin.   Inpatient Diabetes Program Recommendations:    Add Novolog 4 units tidwc for meal coverage insulin.  To speak with pt regarding his HgbA1C.  Thank you. Ailene Ardshonda Carmeron Heady, RD, LDN, CDE Inpatient Diabetes Coordinator [phone]

## 2016-12-20 NOTE — Discharge Summary (Signed)
Physician Discharge Summary  Jeremy Gill:[id number] DOB: 08/07/1945 DOA: 12/18/2016  PCP: [place], [name], MD  Admit date: 12/18/2016 Discharge date: 12/24/2016  Admitted From: Home Disposition: Home   Recommendations for Outpatient Follow-up:  Follow up with PCP in 1-2 weeks for ongoing diabetes management   Home Health: PT Equipment/Devices: Rolling walker Discharge Condition: Stable CODE STATUS: Full Diet recommendation: Carbohydrate-limited.   Brief/Interim Summary: 71 year old man PMH diabetes mellitus type 2, noncompliance with medications, presented with altered mental status. Admitted for DKA, acute kidney injury, hyperkalemia.  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 - DKA resolved with insulin infusion - Emphasized importance of follow up  IDDM: HbA1c 14.1%, uncontrolled due to admitted nonadherence.  - Subcutaneous insulin restarted with improvement near inpatient goals. Will continue basal bolus insulin as below  Acute metabolic encephalopathy secondary to severe hyperglycemia, DKA. CT head negative. - Resolved  Acute kidney injury secondary to hyperglycemia, dehydration - Resolved with IV fluids  Hyperkalemia secondary to acute kidney injury - Resolved with IV fluids  COPD  - asymptomatic   Chronic back pain - stable  Discharge Instructions Discharge Instructions    AMB Referral to [facility] Care Management    Complete by:  As directed    Reason for consult:  GENERAL EMMI DISCHARGE CALLS   Diagnoses of:  Diabetes   Expected date of contact:  1-3 days (reserved for hospital discharges)   Discharge instructions    Complete by:  As directed    You were admitted with DKA due  to noncompliance with insulin therapy. You have improved and may be discharged with the following recommendations:  - Start taking insulin as directed.  - Eat consistently, limiting carbohydrates. - If you do not eat a meal (for example, breakfast) do not give mealtime insulin. Otherwise, take lantus and novolog as directed.  - Follow up with your PCP in the next week to discuss diabetes management.  - You will receive home health physical therapy and a rolling walker to assist with mobility.  - If you notice fever, chest pain, trouble breathing, or uncontrolled high or low blood sugars, seek medical attention right away.     Allergies as of 12/20/2016   No Known Allergies     Medication List    TAKE these medications   amitriptyline 10 MG tablet Commonly known as:  ELAVIL Take 1 tablet (10 mg total) by mouth at bedtime.   aspirin 81 MG chewable tablet Chew 81 mg by mouth 2 (two) times a week. Pt takes on Monday and Friday.   atorvastatin 40 MG tablet Commonly known as:  LIPITOR Take 1 tablet (40 mg total) by mouth daily. What changed:  when to take this   busPIRone 15 MG tablet Commonly known as:  BUSPAR Take 2 tablets (30 mg total) by mouth 2 (two) times daily.   DULoxetine 60 MG capsule Commonly known as:  CYMBALTA TAKE 1 CAPSULE BY MOUTH EVERY DAY   fluocinonide cream 0.05 % Commonly known as:  LIDEX Apply topically 3 (three) times daily as needed. For itching What changed:  how much to take  reasons to take this  additional instructions   gabapentin 300 MG capsule Commonly known as:  NEURONTIN TAKE 3 CAPSULES BY MOUTH 3  TIMES DAILY   insulin glargine 100 UNIT/ML injection Commonly known as:  LANTUS Inject 0.25 mLs (25 Units total) into the skin at bedtime.   liraglutide 18 MG/3ML Sopn Commonly known as:  VICTOZA Inject 0.6 mg subcutaneously into the skin daily, increase to 1.2 mg in a week if tolerated.   methocarbamol 500 MG tablet Commonly known as:   ROBAXIN TAKE 1/2 TABLET BY MOUTH EVERY 8 HOURS AS NEEDED FOR MUSCLE SPASMS   metoprolol succinate 25 MG 24 hr tablet Commonly known as:  TOPROL-XL TAKE 1 TABLET BY MOUTH EVERY DAY What changed:  See the new instructions.   naproxen 500 MG tablet Commonly known as:  NAPROSYN Take 1 tablet (500 mg total) by mouth 2 (two) times daily as needed. What changed:  when to take this   NOVOLOG 100 UNIT/ML injection Generic drug:  insulin aspart INJECT 15 UNITS SUBCUTANEOUSLY 3 TIMES DAILY BEFORE MEALS   traMADol 50 MG tablet Commonly known as:  ULTRAM Take 1 tablet (50 mg total) by mouth every 6 (six) hours as needed. What changed:  reasons to take this   triamcinolone ointment 0.1 % Commonly known as:  KENALOG Apply topically 2 (two) times daily. 1:1 compound with Eucerin What changed:  how much to take  additional instructions      Follow-up Information    [place]andJordan, [name], MD Follow up.   Specialty:  Family Medicine Contact information: [address] [place] [address] [phone]          No Known Allergies  Consultations:  None  Procedures/Studies: Ct Head Wo Contrast  Result Date: 12/18/2016 CLINICAL DATA:  Altered mental status, recent fall this morning, diabetes EXAM: CT HEAD WITHOUT CONTRAST TECHNIQUE: Contiguous axial images were obtained from the base of the skull through the vertex without intravenous contrast. COMPARISON:  None available FINDINGS: Brain: Brain atrophy evident with advanced chronic white matter microvascular ischemic changes throughout the cerebral hemispheres bilaterally. No acute intracranial hemorrhage, mass lesion, definite new infarction, midline shift, herniation, hydrocephalus, or extra-axial fluid collection. No focal mass effect or edema. Cisterns are patent. Cerebellar atrophy as well. Vascular: Atherosclerosis of the intracranial vessels at the skullbase. No hyperdense vessel. Skull: Normal. Negative for fracture or focal  lesion. Sinuses/Orbits: No acute finding. Other: None. IMPRESSION: Atrophy and advanced chronic white matter microvascular changes. No acute intracranial abnormality by noncontrast CT. Electronically Signed   By: Judie PetitM.  Shick M.D.   On: 12/18/2016 16:05   Subjective:   Discharge Exam: BP (!) 149/87 (BP Location: Right Arm)   Pulse 78   Temp 97.7 F (36.5 C) (Oral)   Resp 20   Ht 6' (1.829 m)   Wt 82.1 kg (181 lb)   SpO2 98%   BMI 24.55 kg/m   General: Pt is alert, awake, not in acute distress Cardiovascular: RRR, S1/S2 +, no rubs, no gallops Respiratory: CTA bilaterally, no wheezing, no rhonchi Abdominal: Soft, NT, ND, bowel sounds + Extremities: No edema, no cyanosis Neuro: Alert, oriented, conversant.   Labs: Basic Metabolic Panel:  Recent Labs Lab 12/18/16 1400 12/18/16 1937 12/18/16 2258 12/19/16 0311 12/19/16 0802  NA 137 145 147* 149* 147*  K 5.8* 4.0 3.5 3.7 3.8  CL 93* 108 113* 116* 112*  CO2 19* 20* 24 24 22   GLUCOSE 932* 469* 275* 104* 155*  BUN 42* 36* 36* 34* 35*  CREATININE 1.65* 1.40* 1.27* 1.03 1.08  CALCIUM 10.5* 10.3 10.2 10.0 9.9   CBC:  Recent Labs Lab 12/18/16  1400  WBC 10.1  HGB 14.5  HCT 41.4  MCV 87.0  PLT 214   CBG:  Recent Labs Lab 12/19/16 1125 12/19/16 1708 12/19/16 2026 12/20/16 0730 12/20/16 1114  GLUCAP 229* 231* 321* 217* 200*   Urinalysis    Component Value Date/Time   COLORURINE STRAW (A) 12/18/2016 1356   APPEARANCEUR CLEAR 12/18/2016 1356   LABSPEC 1.028 12/18/2016 1356   PHURINE 5.0 12/18/2016 1356   GLUCOSEU >=500 (A) 12/18/2016 1356   GLUCOSEU NEGATIVE 09/30/2008 0851   HGBUR SMALL (A) 12/18/2016 1356   HGBUR trace-lysed 03/29/2010 0940   BILIRUBINUR NEGATIVE 12/18/2016 1356   BILIRUBINUR neg 01/13/2014 1141   KETONESUR 80 (A) 12/18/2016 1356   PROTEINUR NEGATIVE 12/18/2016 1356   UROBILINOGEN 1.0 01/13/2014 1141   UROBILINOGEN 0.2 03/29/2010 0940   NITRITE NEGATIVE 12/18/2016 1356   LEUKOCYTESUR  NEGATIVE 12/18/2016 1356   Microbiology Recent Results (from the past 240 hour(s))  MRSA PCR Screening     Status: None   Collection Time: 12/18/16  8:30 PM  Result Value Ref Range Status   MRSA by PCR NEGATIVE NEGATIVE Final    Comment:        The GeneXpert MRSA Assay (FDA approved for NASAL specimens only), is one component of a comprehensive MRSA colonization surveillance program. It is not intended to diagnose MRSA infection nor to guide or monitor treatment for MRSA infections.    Time coordinating discharge: Approximately 40 minutes  Hazeline Junkeryan Grunz, MD  [facility] 12/20/2016, 4:21 PM Pager [phone]

## 2016-12-24 ENCOUNTER — Telehealth: Payer: Self-pay

## 2016-12-24 ENCOUNTER — Telehealth: Payer: Self-pay | Admitting: Family Medicine

## 2016-12-24 MED ORDER — GLUCOSE BLOOD VI STRP: ORAL_STRIP | 3 refills | Status: DC

## 2016-12-24 NOTE — Telephone Encounter (Signed)
D/C 12/20/16 To: home  Spoke with pt and he states that he feels much improved. He has been monitoring his blood sugar and it has been trending down. Latest readings are: 223, 240, 276, 265, 240. He has been taking all medications as directed. He declines to schedule follow-up at this time and states that he will call when he needs an appointment.   Dr. [place]azilandJordan - Lorain ChildesFYI   Transition Care Management Follow-up Telephone Call  How have you been since you were released from the hospital? yes   Do you understand why you were in the hospital? yes   Do you understand the discharge instrcutions? yes  Items Reviewed:  Medications reviewed: yes  Allergies reviewed: yes  Dietary changes reviewed: yes  Referrals reviewed: yes   Functional Questionnaire:   Activities of Daily Living (ADLs):   He states they are independent in the following: ambulation, bathing and hygiene, feeding, continence, grooming, toileting and dressing States they require assistance with the following: none   Any transportation issues/concerns?: no   Any patient concerns? no   Confirmed importance and date/time of follow-up visits scheduled: yes but pt declined to schedule at this time   Confirmed with patient if condition begins to worsen call PCP or go to the ER.  Patient was given the Call-a-Nurse line [phone]: yes

## 2016-12-24 NOTE — Telephone Encounter (Signed)
° ° °  Pt request refill of the following:  1 touch verio test strips  Pt is requesting 90 days    Phamacy: [facility]

## 2016-12-24 NOTE — Telephone Encounter (Signed)
Rx sent 

## 2016-12-26 ENCOUNTER — Other Ambulatory Visit: Payer: Self-pay

## 2016-12-26 ENCOUNTER — Other Ambulatory Visit: Payer: Self-pay | Admitting: *Deleted

## 2016-12-26 NOTE — Patient Outreach (Addendum)
[facility]) Care Management  12/26/2016  Jeremy SalinesJames E Gill 1945-10-10 [id number]   Telephone Screen  Referral Date: 11/20/16 Referral Source: HTA Concierge Referral Reason: " DM,in gap needs help with insulin(victoza,Lantus & Novolog)" Insurance: HTA   Voicemail message received from patient requesting RN CM to return his call. Outreach call placed to patient. Spoke with spouse(ROI on file) who handles patient's medical affairs and requested call be completed with her as patient was still asleep. They are responding to unsuccessful letter mailed to them. Discussed with spouse referral and screening completed.   Social: Patient resides in his home along with spouse. Spouse states that patient is independent with ADLS/IADLs. He is able to drive himself to medical appts. She denies patient having any falls within the past year. DME in the home include cane, cbg meter and BP monitor.  Conditions: Per records patient has PMH of chronic pain, "poorly controlled DM", neuropathy, HTN, insomnia, anxiety and HLD. Spouse reports that patient has been a diabetic for about 17 yrs. However, within the past recent years he has not been able to control/manage his diabetes well. Spouse voices that they are "not sure of when and how to take his blood sugars and when to give insulin." She reports that cbgs are being checked about 3x/day and ranges from the 200s-400s. She states that patient can not tell when his blood sugars are elevated but can tell and becomes symptomatic if they get too low. Last A1C per records was 14.1(June 2018). Spouse states that patient is monitoring BP in the home as well. However, she is unsure if BP is controlled. She voices that they need support and education on how to better manage his chronic conditions. Spouse states that patient having ongoing pain mgmt issues related to chronic back pain and is currently being followed by pain mgmt clinic.  Medications: Spouse  reports that patient taking about 10-12 meds. Patient is in the gap coverage for meds. Spouse does not fully understand what this entails. RN CM explained to spouse what this meant. She voices that they are having trouble affording his insulins. Spouse is assisting patient with mgmt of his meds.    Appointments: Patient has appt with PCP on 01/01/17 and is also followed by pain mgmt MD-Dr. Allena KatzPatel.  Advance Directives: Spouse voices that they have not completed and declined further info at this time.  [facility]. [facility].   Plan: RN CM will notify [facility] administrative assistant of case status. RN CM will [facility] for further in home eval/assessment of care needs and management of chronic conditions. RN CM will [facility] for polypharmacy med review and possible med assistance. RN CM provided spouse with [facility] 24 hr Nurse Line contact info.  Antionette Fairyoshanda Mathan Darroch, RN,BSN,CCM [facility] Care [facility] Telephonic Care Management Coordinator Direct Phone: [phone] Toll Free: [phone] Fax: [phone]

## 2016-12-26 NOTE — Patient Outreach (Signed)
[facility]) Care Management  12/26/2016  Jeremy SalinesJames E Gill 03-30-1946 [id number]   Initial outreach for transition of care based upon pt's discharge 12/20/2016 however unsuccessful. Will continue to schedule outreach calls accordingly.  Elliot CousinLisa Tabathia Knoche, RN Care Management Coordinator [facility] Main Office [phone]

## 2016-12-27 ENCOUNTER — Other Ambulatory Visit: Payer: Self-pay | Admitting: *Deleted

## 2016-12-27 NOTE — Patient Outreach (Signed)
[facility]) Care Management  12/27/2016  Lazarus SalinesJames E Sedlar 17-Mar-1946 [id number]   RN spoke with pt today and verified identifiers. Explained the purpose for today's call and possible assistance that could be offered on behalf or [facility]. Pt and primary caregiver receptive to the information. Discussed pt's medical condition related to his diabetes and spouse [name] reports some of his readings from 109 today to 300-400 on other days. States pt is pending an appointment with his provider next Tuesday and will be introduced to Victoza medication on the usage. Spouse has several questions on pt's insulin use regarding parameters with low readings and the dosage. RN strongly encouraged caregiver and pt to talk with their provider on the parameters for dosing pt on his insulin. Currently spouse reports pt is doing better but continues to need guidance with dietary habits and education. RN has offered home visit along with weekly transition of care calls (receptive) however requested a call next week after consulting with the doctor on the parameters for dosing pt with the new medication Victoza. Further discussed a plan of care and goals around diabetes along with pt's participation toward improving his diabeties management. Caregiver states she prefers RN to follow up next week after the office visit with the primary provider and possible scheduled a home visit on that call. RN scheduled the next transition of care call next week and will inquire further on pt's disposition with [facility] For community home visits.   Patient was recently discharged from hospital and all medications have been reviewed.  Elliot CousinLisa Zabria Liss, RN Care Management Coordinator [facility] Main Office [phone]

## 2016-12-31 ENCOUNTER — Telehealth: Payer: Self-pay | Admitting: Pharmacist

## 2016-12-31 NOTE — Patient Outreach (Signed)
[facility]) Care Management  12/31/2016  Jeremy Gill 03/04/1946 [id number]   Patient was called regarding medication management and assistance per referral from [facility] Telephonic Nurse, Antionette Fairyoshanda Florance. Unfortunately, patient did not answer the phone. HIPAA compliant message was left on the patient's voicemail.  Plan:  Call patient back in 5-7 business days.  Beecher McardleKatina J. Tanara Turvey, PharmD, BCACP [facility] Clinical Pharmacist [phone]

## 2016-12-31 NOTE — Telephone Encounter (Signed)
-----   Message from Sharalyn InkLavelda M Comer sent at 12/26/2016 10:30 AM EDT ----- Regarding: Referral: Order for Violeta GelinasBULLARD,[name]  Referral from Antionette Fairyoshanda Florance, RN  "Please see below request"  Thank you  Elza RafterLavelda   ----- Message ----- From: Charlyn MinervaFlorance, Roshanda Jeanette, RN Sent: 12/26/2016  10:10 AM To: [facility] Cm Communication Orders Subject: Order for [name],[name]                       Patient Name: Jeremy MailBULLARD,Rice R([id number]) Sex: Male DOB: 10-25-1945    PCP: [place]andJORDAN, [name]   [facility]   Types of orders made on 12/26/2016: Nursing  Order Date:12/26/2016 Ordering User:FLORANCE, Jolene SchimkeROSHANDA J [[id number]][[id number]] Encounter Provider:Florance, Adrienne Mochaoshanda Jeanette, RN [[id number]] Authorizing Provider: [place], [name], MD [phone][[id number]] Department:[facility]COMM[facility]ITY[[id number]]  Order Specific Information Order: Comm to Pharmacy [Custom: [id number]]  Order #: [id number] Qty: 1   Priority: Routine  Class: Clinic Performed   Comment:[facility] Pharmacy Referral            Referral from: HTA Concierge            Polypharmacy med review-patient taking greater than 10 meds            Medication assistance. Patient in gap coverage and unable to afford             insulins.                                    Thanks,            Antionette Fairyoshanda Florance, RN,BSN,CCM            [facility] Care Management            Telephonic Care Management Coordinator            Direct Phone: [phone]            Toll Free: [phone]            Fax: [phone]   Disposition: Return :RN CM to call patient.   Priority: Routine  Class: Clinic Performed   Comment:[facility] Pharmacy Referral            Referral from: HTA Concierge            Polypharmacy med review-patient taking greater than 10 meds            Medication assistance. Patient in gap coverage and unable to afford             insulins.                                    Thanks,            Antionette Fairyoshanda Florance, RN,BSN,CCM            [facility] Care [facility]     Telephonic Care Management Coordinator            Direct Phone: [phone]            Toll Free: [phone]            Fax: [phone]

## 2016-12-31 NOTE — Progress Notes (Signed)
HPI:   [name] is a 71 y.o. male, who is here today with his wife to follow on recent hospitalization.  TCM call 12/24/16.  He was admitted on 12/18/16 and discharged on 12/20/16.  He presented to the ER with MS changes. He has not been compliant with diabetes treatment of f/u visits.  Dx with DKA, acute metabolic encephalopathy,AKI,and hyperK+.   Lab Results  Component Value Date   HGBA1C 14.1 (H) 11/13/2016   Lab Results  Component Value Date   CREATININE 1.08 12/19/2016   BUN 35 (H) 12/19/2016   NA 147 (H) 12/19/2016   K 3.8 12/19/2016   CL 112 (H) 12/19/2016   CO2 22 12/19/2016    Head CT 12/18/16: Atrophy and advanced chronic white matter microvascular changes.   No acute intracranial abnormality by noncontrast CT. Lab Results  Component Value Date   WBC 10.1 12/18/2016   HGB 14.5 12/18/2016   HCT 41.4 12/18/2016   MCV 87.0 12/18/2016   PLT 214 12/18/2016   No HH or PT needed, he is at his baseline in regard to ambulation/gait , using his cane.  In general he is slowly getting better and closer to his baseline.  Eating breakfast now, which he did not do before. He is trying to follow a healthier diet. He cannot exercise because chronic pain.  He is overdue for eye exam and has not seen a dentists in years.  He is currently on Lantus 25 U around 11-11:30 pm and Novolog 15 U before meals. He never started Victoza.  FG 300-400's. Post postprandial 70's-low 100's. Bedtime 100-200's.  HTN: He is currently on Metoprolol Succinate 25 mg daily. Not longer on Lisinopril.  Denies headache, visual changes, chest pain, dyspnea, or palpitation.  He takes Naproxen 500 mg bid for chronic pain. Still smoking.  Review of Systems  Constitutional: Positive for fatigue. Negative for chills and fever.  HENT: Negative for mouth sores, nosebleeds, sore throat and trouble swallowing.   Eyes: Negative for redness and visual disturbance.  Respiratory:  Negative for cough, shortness of breath and wheezing.   Cardiovascular: Negative for chest pain, palpitations and leg swelling.  Gastrointestinal: Negative for abdominal pain, nausea and vomiting.       No changes in bowel habits.  Endocrine: Negative for polydipsia, polyphagia and polyuria.  Genitourinary: Negative for decreased urine volume, dysuria and hematuria.  Musculoskeletal: Positive for arthralgias, back pain and gait problem.  Skin: Negative for rash and wound.  Neurological: Negative for syncope, weakness and headaches.  Psychiatric/Behavioral: Negative for confusion. The patient is nervous/anxious.       Current Outpatient Prescriptions on File Prior to Visit  Medication Sig Dispense Refill  . aspirin 81 MG chewable tablet Chew 81 mg by mouth 2 (two) times a week. Pt takes on Monday and Friday.    Marland Kitchen atorvastatin (LIPITOR) 40 MG tablet Take 1 tablet (40 mg total) by mouth daily. 90 tablet 3  . busPIRone (BUSPAR) 15 MG tablet Take 2 tablets (30 mg total) by mouth 2 (two) times daily. 180 tablet 2  . fluocinonide cream (LIDEX) 0.05 % Apply topically 3 (three) times daily as needed. For itching (Patient taking differently: Apply 1 application topically 3 (three) times daily as needed (for itching). ) 30 g 3  . gabapentin (NEURONTIN) 300 MG capsule TAKE 3 CAPSULES BY MOUTH 3 TIMES DAILY 270 capsule 2  . glucose blood test strip Use to test blood sugar daily. 100 each 3  .  methocarbamol (ROBAXIN) 500 MG tablet TAKE 1/2 TABLET BY MOUTH EVERY 8 HOURS AS NEEDED FOR MUSCLE SPASMS 45 tablet 1  . metoprolol succinate (TOPROL-XL) 25 MG 24 hr tablet TAKE 1 TABLET BY MOUTH EVERY DAY (Patient taking differently: Take 1 tablet by mouth every evening) 90 tablet 1  . naproxen (NAPROSYN) 500 MG tablet Take 1 tablet (500 mg total) by mouth 2 (two) times daily as needed. (Patient taking differently: Take 500 mg by mouth 2 (two) times daily. ) 60 tablet 2  . traMADol (ULTRAM) 50 MG tablet Take 1  tablet (50 mg total) by mouth every 6 (six) hours as needed. (Patient taking differently: Take 50 mg by mouth every 6 (six) hours as needed for moderate pain. ) 135 tablet 2  . triamcinolone ointment (KENALOG) 0.1 % Apply topically 2 (two) times daily. 1:1 compound with Eucerin (Patient taking differently: Apply 1 application topically 2 (two) times daily. Compounded as a 1:1 mixture with Eucerin.) 453.6 g 3  . liraglutide (VICTOZA) 18 MG/3ML SOPN Inject 0.6 mg subcutaneously into the skin daily, increase to 1.2 mg in a week if tolerated. (Patient not taking: Reported on 12/27/2016) 9 mL 2   No current facility-administered medications on file prior to visit.      Past Medical History:  Diagnosis Date  . ANKLE SPRAIN 12/16/2006   Qualifier: Diagnosis of  By: Tawanna Cooler MD, Eugenio Hoes   . ANXIETY 10/25/2006  . CELLULITIS, LEG, RIGHT 09/08/2007   Qualifier: Diagnosis of  By: Tawanna Cooler MD, Eugenio Hoes   . Chronic pain disorder 11/30/2015  . COPD 10/25/2006  . 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 DIABETES MELLITUS, TYPE I 10/25/2006  . Diabetic polyneuropathy (HCC) 04/08/2007   Qualifier: Diagnosis of  By: Tawanna Cooler MD, Eugenio Hoes   . 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 Essential hypertension 10/25/2006   Qualifier: Diagnosis of  By: Rosette Reveal RN, Jorene Minors   . Generalized osteoarthritis of multiple sites 11/30/2015  . HYPERTENSION 10/25/2006  . 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 Low back pain with radiation 01/23/2016  . Polyneuropathy in diabetes(357.2) 04/08/2007  . Psoriasis   . TOBACCO ABUSE 07/08/2007  . VENOUS INSUFFICIENCY 11/25/2007   No Known Allergies  Social History   Social History  . Marital status: Married    Spouse name: N/A  . Number of children: N/A  . Years of education: N/A   Occupational History  . Self Employed    Social History Main  Topics  . Smoking status: Current Every Day Smoker    Types: Pipe    Last attempt to quit: 05/29/2011  . Smokeless tobacco: Never Used  . Alcohol use No  . Drug use: No  . Sexual activity: Not Asked   Other Topics Concern  . None   Social History Narrative   Regular exercise-no    Vitals:   01/01/17 1530  BP: 102/70  Pulse: 88  Resp: 12  SpO2: 97%   Body mass index is 24.89 kg/m.   Physical Exam  Nursing note and vitals reviewed. Constitutional: He is oriented to person, place, and time. He appears well-developed. No distress.  HENT:  Head: Normocephalic and atraumatic.  Mouth/Throat: Oropharynx is clear and moist and mucous membranes are normal. Abnormal dentition.  Eyes: Conjunctivae and EOM are normal. Pupils are unequal.  Right pupil slightly bigger.  Cardiovascular: Normal rate and regular rhythm.   No murmur heard. Pulses:      Dorsalis pedis pulses are 2+ on the right side, and 2+ on the left side.  Respiratory: Effort normal and breath sounds normal. No respiratory distress.  GI: Soft. He exhibits no mass. There is no hepatomegaly. There is no tenderness.  Musculoskeletal: He exhibits edema (Trace pitting edema LE bilateral, pedal 1+ pitting,bilateral).  Lymphadenopathy:    He has no cervical adenopathy.  Neurological: He is alert and oriented to person, place, and time.  No focal deficit appreciated. Mildly unstable gait assisted with cane.  Skin: Skin is warm. No erythema.  Psychiatric: He has a normal mood and affect. Cognition and memory are normal.  Fairly groomed, good eye contact.    Diabetic Foot Exam - Simple   Simple Foot Form Diabetic Foot exam was performed with the following findings:  Yes 01/01/2017  4:41 PM  Visual Inspection See comments:  Yes Sensation Testing See comments:  Yes Pulse Check Posterior Tibialis and Dorsalis pulse intact bilaterally:  Yes Comments Decreased monofilament bilateral. Hypertrophic and long  toenails. Scaly/flaky skin on plantar area, bilateral.      ASSESSMENT AND PLAN:   Mr. Fayrene FearingJames was seen today for hospitalization follow-up.  Diagnoses and all orders for this visit:  Lab Results  Component Value Date   CREATININE 1.18 01/01/2017   BUN 26 (H) 01/01/2017   NA 138 01/01/2017   K 4.6 01/01/2017   CL 100 01/01/2017   CO2 34 (H) 01/01/2017    Diabetes mellitus type 2 with neurological manifestations (HCC)  Poorly controlled. He has Victoza pen with him today, he was educated bout how to use it and first dose given her today in office: 0.6 mg. In 1-2 weeks he can increase it to 1.2 mg. We discussed side effects of his diabetes medications. Because having frequent BS's < 80, we will stop Novolog for now. Lantus increased from 25 U to 30 U, I would like Lantus around 5 pm if possible. Will consider adding Metformin, which he tried before and no side effects reported. Caution with hypoglycemia. Regular exercise and healthy diet with avoidance of added sugar food intake is an important part of treatment and recommended. Annual eye exam, periodic dental and foot care recommended.He needs to arrange an eye exam. F/U in 2 months  -     insulin glargine (LANTUS) 100 UNIT/ML injection; Inject 0.3 mLs (30 Units total) into the skin at bedtime. -     Basic metabolic panel  Essential hypertension  Adequately controlled. He is not on ACEI or ARB now, Lisinopril was discontinue due to AKI. For now no changes. Low salt diet recommended. F/U in 2 months.  -     Basic metabolic panel  AKI (acute kidney injury) (HCC)  Side effects of Naproxen discussed. Adequate BP and DM controlled. Further recommendations will be given according to lab results.  -     Basic metabolic panel  Hyperkalemia  Further recommendations will be given according to K+ results.      [name]. SwazilandJordan, MD  [facility]. [place] office.

## 2017-01-01 ENCOUNTER — Encounter: Payer: Self-pay | Admitting: Family Medicine

## 2017-01-01 ENCOUNTER — Ambulatory Visit (INDEPENDENT_AMBULATORY_CARE_PROVIDER_SITE_OTHER): Payer: PPO | Admitting: Family Medicine

## 2017-01-01 ENCOUNTER — Telehealth: Payer: Self-pay | Admitting: Family Medicine

## 2017-01-01 VITALS — BP 102/70 | HR 88 | Resp 12 | Ht 72.0 in | Wt 183.5 lb

## 2017-01-01 DIAGNOSIS — N179 Acute kidney failure, unspecified: Secondary | ICD-10-CM | POA: Diagnosis not present

## 2017-01-01 DIAGNOSIS — E1149 Type 2 diabetes mellitus with other diabetic neurological complication: Secondary | ICD-10-CM

## 2017-01-01 DIAGNOSIS — I10 Essential (primary) hypertension: Secondary | ICD-10-CM | POA: Diagnosis not present

## 2017-01-01 DIAGNOSIS — E875 Hyperkalemia: Secondary | ICD-10-CM | POA: Diagnosis not present

## 2017-01-01 MED ORDER — INSULIN GLARGINE 100 UNIT/ML ~~LOC~~ SOLN: 30.0000 [IU] | Freq: Every day | SUBCUTANEOUS | 1 refills | Status: DC

## 2017-01-01 NOTE — Telephone Encounter (Signed)
Disregard

## 2017-01-01 NOTE — Patient Instructions (Addendum)
A few things to remember from today's visit:   Diabetes mellitus type 2 with neurological manifestations (HCC) - Plan: insulin glargine (LANTUS) 100 UNIT/ML injection, Basic metabolic panel  Essential hypertension - Plan: Basic metabolic panel  AKI (acute kidney injury) (HCC) - Plan: Basic metabolic panel  Hyperkalemia  Lantus increased to 30 U. Stop Novolog for now. Start Victoza.  HgA1C goal < 8.0. Avoid sugar added food:regular soft drinks, energy drinks, and sports drinks. candy. cakes. cookies. pies and cobblers. sweet rolls, pastries, and donuts. fruit drinks, such as fruitades and fruit punch. dairy desserts, such as ice cream  Mediterranean diet has showed benefits for sugar control.  How much and what type of carbohydrate foods are important for managing diabetes. The balance between how much insulin is in your body and the carbohydrate you eat makes a difference in your blood glucose levels.  Fasting blood sugar ideally 140 or less, 2 hours after meals less than 180.   Regular exercise also will help with controlling disease, daily brisk walking as tolerated for 15-30 min definitively will help.   Avoid skipping meals, blood sugar might drop and cause serious problems. Remember checking feet periodically, good dental hygiene, and annual eye exam.  Smoking cessation strongly recommended.    Please be sure medication list is accurate. If a new problem present, please set up appointment sooner than planned today.

## 2017-01-02 LAB — BASIC METABOLIC PANEL
BUN: 26 mg/dL — AB (ref 6–23)
CALCIUM: 9 mg/dL (ref 8.4–10.5)
CO2: 34 meq/L — AB (ref 19–32)
Chloride: 100 mEq/L (ref 96–112)
Creatinine, Ser: 1.18 mg/dL (ref 0.40–1.50)
GFR: 64.62 mL/min (ref >60.00)
GLUCOSE: 188 mg/dL — AB (ref 70–99)
Potassium: 4.6 mEq/L (ref 3.5–5.1)
SODIUM: 138 meq/L (ref 135–145)

## 2017-01-03 ENCOUNTER — Other Ambulatory Visit: Payer: Self-pay | Admitting: *Deleted

## 2017-01-03 ENCOUNTER — Other Ambulatory Visit: Payer: Self-pay | Admitting: Physical Medicine & Rehabilitation

## 2017-01-03 ENCOUNTER — Encounter: Payer: Self-pay | Admitting: *Deleted

## 2017-01-03 NOTE — Patient Outreach (Signed)
[facility]) Care Management  01/03/2017  Lazarus SalinesJames E Dills [birth year] [id number]    3rd Unsuccessful outreach call today will send outreach letter for pending services.  Elliot CousinLisa Sahory Nordling, RN Care Management Coordinator [facility] Main Office [phone]

## 2017-01-03 NOTE — Patient Outreach (Signed)
[facility]) Care Management  01/03/2017  Jeremy SalinesJames E Kelleher [birth year] [id number]   RN spoke with pt's caregiver spouse [name] today and purpose for today's call via [facility]. Note pt has verbally consent RN to speak with his spouse. Spouse indicates pt is doing "much better" now that his insulin has been adjusted by his provider. States he was running 300-400 now under 200 with all his readings. States pt hs changed his dietary habits and eating healthier foods items. RN praised pt for his efforts in managing his diabetes. RN continue to offer home visits in addition to the ongoing transition of care contacts however wife feels pt is doing so well with the recent changes that he no longer needs telephone calls or home visits. Wife very appreciative and indicated she would contact this RN case manager directly if pt needs [facility]. RN verified the contact and informed wife to contact [facility] if there are any additional request or inquires concerning the pt's care. Will update the involved [facility] member of pt's disposition with community and alert his provider.  Elliot CousinLisa Rodolfo Gaster, RN Care Management Coordinator [facility] Main Office [phone]

## 2017-01-09 ENCOUNTER — Other Ambulatory Visit: Payer: Self-pay | Admitting: Pharmacist

## 2017-01-09 ENCOUNTER — Telehealth: Payer: Self-pay

## 2017-01-09 NOTE — Patient Outreach (Signed)
[facility]) Care Management  01/09/2017  Jeremy SalinesJames E Jaquith 26-Jul-1945 [id number]   Patient was called regarding medication management and assistance per referral from [facility] Telephonic Nurse, Antionette Fairyoshanda Florance. Unfortunately, patient did not answer the phone. HIPAA compliant message was left on the patient's voicemail.  Plan:  Call patient back in 3-5 business days. If no response after next call--send unsuccessful contact letter.  Beecher McardleKatina J. Caran Storck, PharmD, BCACP [facility] Clinical Pharmacist [phone]

## 2017-01-09 NOTE — Telephone Encounter (Signed)
Call to Mr. Jeremy Gill regarding cologuard. Stated I was out x 6 weeks and he did confirm the cologuard was ordered . Recent episode of illness and was sleeping so did not keep him.

## 2017-01-10 ENCOUNTER — Encounter: Payer: Self-pay | Admitting: Family Medicine

## 2017-01-10 DIAGNOSIS — Z1212 Encounter for screening for malignant neoplasm of rectum: Secondary | ICD-10-CM | POA: Diagnosis not present

## 2017-01-10 DIAGNOSIS — Z1211 Encounter for screening for malignant neoplasm of colon: Secondary | ICD-10-CM | POA: Diagnosis not present

## 2017-01-10 LAB — COLOGUARD

## 2017-01-16 ENCOUNTER — Encounter: Payer: Self-pay | Admitting: Pharmacist

## 2017-01-16 ENCOUNTER — Other Ambulatory Visit: Payer: Self-pay | Admitting: Pharmacist

## 2017-01-16 NOTE — Patient Outreach (Signed)
[facility]) Care Management  01/16/2017  Jeremy Gill Oct 23, 1945 [id number]   Patient was called regarding medication assistance per referral.  Patient's wife answered the phone and said the patient was in the bed. HIPAA identifiers were obtained.  Patient is a 71 year old male with multiple medical conditions including but not limited to:  Type 2 diabetes, COPD, hypertension, chronic pain, anxiety, and osteoarthritis.    Patient's wife reported helping the patient with his medications. She said the patient is in the "donut hole" and is having difficulty affording Victoza and Lantus.   Objective:   Drugs sorted by system:  Neurologic/Psychologic: Amitriptyline Duloxetine Gabapentin Buspirone  Cardiovascular: Aspirin Atorvastatin Metoprolol XL  Endocrine: Lantus Victoza  Renal:  Topical: Fluocinonide Triamcinolone  Pain: Methocarbamol Naproxen Tramadol  Medication Assistance Findings: -patient is over income for "Extra Help Program" offered by Social Security -patient may qualify for [employer] Program to receive Vicotza and [employer] Program to receive Lantus if he has spent >$1000 in out-of-pocket medication expenses -TROOP (true-out-of-pocket) spend for the patient was requested from [facility]   Plan:  Follow up with the patient in 3-5 business days after receiving the out-of-pocket spend  If patient has spent enough on medications, route letter to [facility] Pharmacy Technician, Daryll Brod to complete the process.  [name], PharmD, BCACP [name] Clinical Pharmacist [phone]

## 2017-01-21 ENCOUNTER — Other Ambulatory Visit: Payer: Self-pay | Admitting: Pharmacist

## 2017-01-21 NOTE — Patient Outreach (Addendum)
[facility]) Care Management  01/21/2017  Jeremy Gill [birth year] [id number]   Patient's wife was called today to follow up.  HIPAA identifiers were obtained. In addition, the TROOP (true-out-of-pocket) spend document from the Pharmacy Department at [facility]) was received. The amount the TROOP document and the TROOP from the EOB (estimation of benefits) mailed to the patient from [employer] did not match.  [employer] was called on the patient's behalf and the EOB was requested to be sent to the [facility] office and will be forwarded to [facility] as she will be sending the patient the actual applications and handling follow up.   Plan: Daryll Brod, CPhT will follow up with the patient.  [name], PharmD, BCACP [facility] Clinical Pharmacist [phone]

## 2017-01-23 ENCOUNTER — Other Ambulatory Visit: Payer: Self-pay | Admitting: Pharmacy Technician

## 2017-01-23 NOTE — Patient Outreach (Signed)
[facility]) Care Management  01/23/2017  Jeremy Gill 1945-09-16 [id number]  I mailed patient assistance application's for Victoza and Lantus to the patient for completion. I attempted to contact the patient so that he could be on the lookout for them in the mail but he did not answer or return my call. To avoid any delays I went ahead and mailed them out and faxed the prescriber portion of the application for Victoza since it's a separate page. I will have to wait until I receive the application's back to fax the application for the Lantus since the patient needs to complete his part first.  Daryll Brodrystal Kagan Mutchler, CPhT [facility] [phone]

## 2017-01-24 ENCOUNTER — Other Ambulatory Visit: Payer: Self-pay | Admitting: Physical Medicine & Rehabilitation

## 2017-01-29 ENCOUNTER — Other Ambulatory Visit: Payer: Self-pay | Admitting: Pharmacist

## 2017-01-29 ENCOUNTER — Encounter: Payer: Self-pay | Admitting: Pharmacist

## 2017-01-29 ENCOUNTER — Telehealth: Payer: Self-pay | Admitting: Family Medicine

## 2017-01-29 ENCOUNTER — Telehealth: Payer: Self-pay

## 2017-01-29 NOTE — Telephone Encounter (Signed)
° ° ° °  Sending you a message about sample of Victoza

## 2017-01-29 NOTE — Telephone Encounter (Signed)
There's one in the small fridge in our pod with his name on it & directions.  Thanks!

## 2017-01-29 NOTE — Telephone Encounter (Signed)
Medication Samples have been provided to the patient.  Drug name: Victoza       Strength: 18mg /343ml       Qty: 1 pen  LOT: [id number]  Exp.Date: 01/2018  Dosing instructions: Inject 0.6 mg subcutaneously into the skin daily, increase to 1.2 mg in a week if well tolerated.  The patient has been instructed regarding the correct time, dose, and frequency of taking this medication, including desired effects and most common side effects.   Dayton ScrapeSarah E Self 2:59 PM 01/29/2017

## 2017-01-30 NOTE — Patient Outreach (Signed)
[facility]) Care Management  01/30/2017  Lazarus SalinesJames E Pecore [birth year] [id number]   Patient's wife called with questions about the patient assistance forms that were mailed to her. HIPAA identifiers were obtained. At first glance, patient's wife only saw the application from Lantus but upon further review found the application from [employer].    She said she would mail the signed applications and supporting documentation back by the end of the week.   Plan:  Patient will be follow-up by [facility] Clinical Pharmacist, Daryll Brodrystal Walker, CPhT   Beecher McardleKatina J. Jakyiah Briones, PharmD, [facility] Clinical Pharmacist [phone]

## 2017-01-31 ENCOUNTER — Telehealth: Payer: Self-pay | Admitting: Family Medicine

## 2017-01-31 NOTE — Telephone Encounter (Signed)
° ° ° °  Pt req test strips for 1 touch verio  Pt said it need to be written for 2 times a day    Pharmacy ; [facility]

## 2017-02-01 MED ORDER — GLUCOSE BLOOD VI STRP: ORAL_STRIP | 3 refills | Status: DC

## 2017-02-01 NOTE — Telephone Encounter (Signed)
Rx sent 

## 2017-02-05 DIAGNOSIS — E1169 Type 2 diabetes mellitus with other specified complication: Secondary | ICD-10-CM | POA: Insufficient documentation

## 2017-02-05 DIAGNOSIS — E785 Hyperlipidemia, unspecified: Secondary | ICD-10-CM

## 2017-02-05 NOTE — Progress Notes (Signed)
HPI:   Mr.Nakoa E Mcghie is a 71 y.o. male, who is here today with his wife to follow on recent OV.   He was seen on 01/01/17 to follow on hospitalization, DKA and acute encephalopathy.  Last OV Victoza was started, he is on 1.2 mg daily. He is also on Lantus, which was increased from 30 U to 35 U. He is tolerating medication well. He denies any hypoglycemia. BS before breakfast, around 10-11 AM in the low and mid 200s. Glucose at nigh tbefore supper, around 6 PM in the 200s. He has had a few in the low or mid 300s, max 372. He has not had any in the 400's.   Lab Results  Component Value Date   HGBA1C 14.1 (H) 11/13/2016   Lab Results  Component Value Date   MICROALBUR 0.7 02/08/2016   She has noted that BS are higher in the morning when he eats fast food the night before. He is to not compliant with dietary recommendations. He denies abdominal pain, nausea, vomiting, polydipsia, polyuria, polyphagia.  HTN:  Lisinopril was discontinued after last hospitalization, AKI.  Denies headache, visual changes, chest pain, dyspnea, palpitation, focal weakness, or edema.  Lab Results  Component Value Date   CREATININE 1.18 01/01/2017   BUN 26 (H) 01/01/2017   NA 138 01/01/2017   K 4.6 01/01/2017   CL 100 01/01/2017   CO2 34 (H) 01/01/2017     He is on Metoprolol succinate 25 mg daily, which was initially prescribed to treat sinus tachycardia. Reporting some SBP in the 80's a few times.   Review of Systems  Constitutional: Positive for fatigue (no more than usual). Negative for activity change, appetite change, fever and unexpected weight change.  HENT: Negative for mouth sores, sore throat and trouble swallowing.   Eyes: Negative for redness and visual disturbance.  Respiratory: Negative for cough, shortness of breath and wheezing.   Cardiovascular: Negative for chest pain, palpitations and leg swelling.  Gastrointestinal: Negative for abdominal pain, nausea and  vomiting.  Endocrine: Negative for polydipsia, polyphagia and polyuria.  Genitourinary: Negative for decreased urine volume, dysuria and hematuria.  Musculoskeletal: Positive for back pain and gait problem.  Skin: Negative for pallor and rash.  Neurological: Negative for syncope, weakness and headaches.  Psychiatric/Behavioral: Negative for confusion. The patient is nervous/anxious.       Current Outpatient Prescriptions on File Prior to Visit  Medication Sig Dispense Refill  . amitriptyline (ELAVIL) 10 MG tablet TAKE 1 TABLET BY MOUTH AT BEDTIME 30 tablet 2  . aspirin 81 MG chewable tablet Chew 81 mg by mouth 2 (two) times a week. Pt takes on Monday and Friday.    Marland Kitchen atorvastatin (LIPITOR) 40 MG tablet Take 1 tablet (40 mg total) by mouth daily. 90 tablet 3  . busPIRone (BUSPAR) 15 MG tablet Take 2 tablets (30 mg total) by mouth 2 (two) times daily. 180 tablet 2  . DULoxetine (CYMBALTA) 60 MG capsule TAKE 1 CAPSULE BY MOUTH EVERY DAY 30 capsule 2  . fluocinonide cream (LIDEX) 0.05 % Apply topically 3 (three) times daily as needed. For itching (Patient taking differently: Apply 1 application topically 3 (three) times daily as needed (for itching). ) 30 g 3  . gabapentin (NEURONTIN) 300 MG capsule TAKE 3 CAPSULES BY MOUTH 3 TIMES DAILY 270 capsule 2  . insulin glargine (LANTUS) 100 UNIT/ML injection Inject 0.3 mLs (30 Units total) into the skin at bedtime. (Patient taking differently: Inject 35  Units into the skin at bedtime. ) 15 mL 1  . liraglutide (VICTOZA) 18 MG/3ML SOPN Inject 0.6 mg subcutaneously into the skin daily, increase to 1.2 mg in a week if tolerated. 9 mL 2  . methocarbamol (ROBAXIN) 500 MG tablet TAKE 1/2 TABLET BY MOUTH EVERY 8 HOURS AS NEEDED FOR MUSCLE SPASMS 45 tablet 1  . naproxen (NAPROSYN) 500 MG tablet TAKE 1 TABLET BY MOUTH 2 TIMES DAILY WITH A MEAL 60 tablet 2  . traMADol (ULTRAM) 50 MG tablet Take 1 tablet (50 mg total) by mouth every 6 (six) hours as needed.  (Patient taking differently: Take 50 mg by mouth every 6 (six) hours as needed for moderate pain. ) 135 tablet 2  . triamcinolone ointment (KENALOG) 0.1 % Apply topically 2 (two) times daily. 1:1 compound with Eucerin (Patient taking differently: Apply 1 application topically 2 (two) times daily. Compounded as a 1:1 mixture with Eucerin.) 453.6 g 3   No current facility-administered medications on file prior to visit.      Past Medical History:  Diagnosis Date  . ANKLE SPRAIN 12/16/2006   Qualifier: Diagnosis of  By: Tawanna Cooler MD, Eugenio Hoes   . ANXIETY 10/25/2006  . CELLULITIS, LEG, RIGHT 09/08/2007   Qualifier: Diagnosis of  By: Tawanna Cooler MD, Eugenio Hoes   . Chronic pain disorder 11/30/2015  . COPD 10/25/2006  . 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 DIABETES MELLITUS, TYPE I 10/25/2006  . Diabetic polyneuropathy (HCC) 04/08/2007   Qualifier: Diagnosis of  By: Tawanna Cooler MD, Eugenio Hoes   . 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 Essential hypertension 10/25/2006   Qualifier: Diagnosis of  By: Rosette Reveal RN, Jorene Minors   . Generalized osteoarthritis of multiple sites 11/30/2015  . HYPERTENSION 10/25/2006  . 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 Low back pain with radiation 01/23/2016  . Polyneuropathy in diabetes(357.2) 04/08/2007  . Psoriasis   . TOBACCO ABUSE 07/08/2007  . VENOUS INSUFFICIENCY 11/25/2007   No Known Allergies  Social History   Social History  . Marital status: Married    Spouse name: N/A  . Number of children: N/A  . Years of education: N/A   Occupational History  . Self Employed    Social History Main Topics  . Smoking status: Current Every Day Smoker    Types: Pipe    Last attempt to quit: 05/29/2011  . Smokeless tobacco: Never Used  . Alcohol use No  . Drug use: No  . Sexual activity: Not Asked   Other Topics Concern  . None   Social  History Narrative   Regular exercise-no    Vitals:   02/06/17 1444  BP: 104/60  Pulse: (!) 103  Resp: 12  SpO2: 93%   Body mass index is 24.56 kg/m.  Wt Readings from Last 3 Encounters:  02/06/17 181 lb 2 oz (82.2 kg)  01/01/17 183 lb 8 oz (83.2 kg)  12/19/16 181 lb (82.1 kg)    Physical Exam  Nursing note and vitals reviewed. Constitutional: He is oriented to person, place, and time. He appears well-developed and well-nourished. No distress.  HENT:  Head: Normocephalic and atraumatic.  Mouth/Throat: Oropharynx is clear and moist and mucous membranes are normal.  Poor dentition.  Eyes: Pupils are equal, round, and reactive to light. Conjunctivae are normal.  Cardiovascular: Regular rhythm.  Tachycardia present.  No murmur heard. DP pulses present bilateral.   Respiratory: Effort normal and breath sounds normal. No respiratory distress.  GI: Soft. He exhibits no mass. There is no hepatomegaly. There is no tenderness.  Musculoskeletal: He exhibits edema (Trace pitting LE edema, bilateral). He exhibits no tenderness.  Lymphadenopathy:    He has no cervical adenopathy.  Neurological: He is alert and oriented to person, place, and time. He has normal strength.  Stable gait assisted with cane.  Skin: Skin is warm and dry. No erythema.  Psychiatric: His mood appears anxious. Cognition and memory are normal.  Fairly groomed, good eye contact.    ASSESSMENT AND PLAN:   Mr. Fayrene FearingJames was seen today for follow-up.  Diagnoses and all orders for this visit:  Diabetes mellitus type 2 with neurological manifestations (HCC)  BS's in general have improved but still having BS's > 200 and a few in the 300's. We discussed a few options, he has taken Metformin in the past and does not recall any major side effects. He agrees with trying Metformin 500 mg twice daily, we discussed some side effects. No changes on Lantus or Victoza. We discussed the importance of complying with dietary  recommendations. Caution with low blood sugars. Follow-up in 2 months.  -     metFORMIN (GLUCOPHAGE) 500 MG tablet; Take 1 tablet (500 mg total) by mouth 2 (two) times daily with a meal. -     glucose blood test strip; Use to test blood sugar three-four times daily.  Essential hypertension  BP Today was in the lower normal range, while he is reporting low BPs at home. Metoprolol Succinate decreased from 25 mg to 12.5 mg. Low salt diet recommended. Continue monitoring BP at home. Instructed about warning signs. Follow-up in 2 months, before if needed.  -     metoprolol succinate (TOPROL-XL) 25 MG 24 hr tablet; Take 0.5 tablets (12.5 mg total) by mouth daily.   Sinus tachycardia  Mild. I think at this time it may be related with mild hypotension. Adequate hydration recommended. Clearly instructed about warning signs. F/U in 2 months.     Kaytlan Behrman G. SwazilandJordan, MD  [facility]. [place] office.

## 2017-02-06 ENCOUNTER — Ambulatory Visit (INDEPENDENT_AMBULATORY_CARE_PROVIDER_SITE_OTHER): Payer: PPO | Admitting: Family Medicine

## 2017-02-06 ENCOUNTER — Encounter: Payer: Self-pay | Admitting: Family Medicine

## 2017-02-06 VITALS — BP 104/60 | HR 103 | Resp 12 | Ht 72.0 in | Wt 181.1 lb

## 2017-02-06 DIAGNOSIS — I10 Essential (primary) hypertension: Secondary | ICD-10-CM | POA: Diagnosis not present

## 2017-02-06 DIAGNOSIS — E1149 Type 2 diabetes mellitus with other diabetic neurological complication: Secondary | ICD-10-CM | POA: Diagnosis not present

## 2017-02-06 DIAGNOSIS — R000 Tachycardia, unspecified: Secondary | ICD-10-CM | POA: Diagnosis not present

## 2017-02-06 MED ORDER — GLUCOSE BLOOD VI STRP: ORAL_STRIP | 3 refills | Status: DC

## 2017-02-06 MED ORDER — METFORMIN HCL 500 MG PO TABS: 500.0000 mg | ORAL_TABLET | Freq: Two times a day (BID) | ORAL | 3 refills | Status: DC

## 2017-02-06 MED ORDER — METOPROLOL SUCCINATE ER 25 MG PO TB24: 12.5000 mg | ORAL_TABLET | Freq: Every day | ORAL | 1 refills | Status: DC

## 2017-02-06 NOTE — Patient Instructions (Addendum)
A few things to remember from today's visit:   Diabetes mellitus type 2 with neurological manifestations (HCC) - Plan: metFORMIN (GLUCOPHAGE) 500 MG tablet  Essential hypertension - Plan: metoprolol succinate (TOPROL-XL) 25 MG 24 hr tablet  Hyperlipidemia associated with type 2 diabetes mellitus (HCC)  Sinus tachycardia - Plan: metoprolol succinate (TOPROL-XL) 25 MG 24 hr tablet  Metoprolol decreased to 1/2 tab.  Continue monitoring blood pressure. Metformin added for diabetes. Rest unchanged.   Please be sure medication list is accurate. If a new problem present, please set up appointment sooner than planned today.

## 2017-02-07 ENCOUNTER — Other Ambulatory Visit: Payer: Self-pay | Admitting: Physical Medicine & Rehabilitation

## 2017-02-20 ENCOUNTER — Other Ambulatory Visit: Payer: Self-pay | Admitting: Pharmacist

## 2017-02-20 NOTE — Patient Outreach (Signed)
[facility]) Care Management  02/20/2017  Jeremy Gill Mar 01, 1946 [id number]   Patient's wife called to inquire about the status of the patient's application.  HIPAA identifiers were obtained.  After speaking with Daryll Brod, CPhT, it was discovered that we are still waiting on a form from Dr. [place].  In addition, the representative at [employer] reported the patient had been denied for their program because he had not spent the required $1000 in out of pocket medication expenditures.  [employer] was called to get most up to date out-of-pocket expenses. HTA reported $883.15.  An EOB was requested which showed a total out of pocket medication expense of >$1100.  When asked about the discrepancy in the two numbers, it was revealed the patient in in the coverage gap and as such is getting a percentage of the drug cost paid by the drug manufacturer for brand name drugs.    Therefore, the $883.15 is the correct figure.  It was explained to the patient's wife an additional $116.85 needs to be spent to meet the required out of pocket expense for both [employer] (Lantus) and Victoza [employer]).  Patient's wife understood they are going to need to purchase Victoza at least one more time this year if the patient needs the medication right away.  Plan:  Patient's wife will call and let us know when they purchase the Victoza so a new expense statement can be prepared by [employer].   [name], PharmD, BCACP [facility] Clinical Pharmacist [phone]

## 2017-02-25 ENCOUNTER — Telehealth: Payer: Self-pay | Admitting: Interventional Cardiology

## 2017-02-25 ENCOUNTER — Other Ambulatory Visit: Payer: Self-pay | Admitting: Pharmacist

## 2017-02-25 NOTE — Patient Outreach (Signed)
[facility]) Care Management  02/25/2017  Jeremy Gill 1945-06-22 [id number]  Patient's wife called and said they spent an additional $180 over the weekend on medications.  HIPAA identifiers were obtained.  [employer] was called on the patient's behalf.  Burney Gauze, HTA Pharmacy Technician confirmed the patient current medication out of pocket spend is: $1099.  A TROOP document was requested.  Plan:  Once documents are received they will be faxed to [facility] so they can be faxed for medication assistance.   [name], PharmD, BCACP [facility] Clinical Pharmacist [phone]

## 2017-02-25 NOTE — Telephone Encounter (Signed)
New message    Pt wife wants to know if pt can have coffee in the morning before labs since they are fasting. Please call.

## 2017-02-25 NOTE — Telephone Encounter (Signed)
Made patient aware that he can have coffee prior to his labs but he should not add any cream or sugar. Patient verbalized understanding.

## 2017-02-26 ENCOUNTER — Other Ambulatory Visit: Payer: PPO

## 2017-02-26 DIAGNOSIS — R0789 Other chest pain: Secondary | ICD-10-CM

## 2017-02-27 ENCOUNTER — Other Ambulatory Visit: Payer: Self-pay | Admitting: Pharmacy Technician

## 2017-02-27 LAB — LIPID PANEL
Chol/HDL Ratio: 1.9 ratio (ref 0.0–5.0)
Cholesterol, Total: 108 mg/dL (ref 100–199)
HDL: 58 mg/dL (ref >39)
LDL Calculated: 43 mg/dL (ref 0–99)
Triglycerides: 36 mg/dL (ref 0–149)
VLDL Cholesterol Cal: 7 mg/dL (ref 5–40)

## 2017-02-27 LAB — HEPATIC FUNCTION PANEL
ALT: 20 IU/L (ref 0–44)
AST: 19 IU/L (ref 0–40)
Albumin: 4.4 g/dL (ref 3.5–4.8)
Alkaline Phosphatase: 40 IU/L (ref 39–117)
BILIRUBIN TOTAL: 0.4 mg/dL (ref 0.0–1.2)
BILIRUBIN, DIRECT: 0.14 mg/dL (ref 0.00–0.40)
TOTAL PROTEIN: 7.1 g/dL (ref 6.0–8.5)

## 2017-02-27 NOTE — Patient Outreach (Signed)
[facility]) Care Management  02/27/2017  Jeremy Gill Aug 07, 1945 [id number]  Patient assistance application's for [employer] (Lantus) and [employer] (Victoza) were faxed today.  Daryll Brod, CPhT [facility] [employer] [employer] [phone]

## 2017-03-05 ENCOUNTER — Telehealth: Payer: Self-pay

## 2017-03-05 NOTE — Telephone Encounter (Signed)
I will contact patient in the morning to let him know that his Victoza and pen needles are at the office for him to come pick up.

## 2017-03-06 NOTE — Telephone Encounter (Signed)
I left a message with patient's wife letting her know that patient's Victoza has arrived at the office with pen needles and that they can come pick them up whenever they have a chance. The Victoza is in the fridge with a label in our pod and the pen needles are sitting on my desk with his name on them.

## 2017-03-07 ENCOUNTER — Other Ambulatory Visit: Payer: Self-pay | Admitting: Pharmacist

## 2017-03-07 NOTE — Patient Outreach (Signed)
[facility]) Care Management  03/07/2017  KJ IMBERT [birth year] [id number]   Patient's wife called to inquire about the status of the Lantus application. HIPAA identifiers were obtained. Patient was informed Daryll Brod sent the necessary documentation of the patient's updated out of pocket spend to both [employer] (Victoza) and [employer] (Lantus)  on 02/27/17.  Patient's wife said they received Victoza but had not received Lantus.    [employer] Patient Assistance Program was called on the patient's behalf.  According to the representative, patient was approved 03/04/17 and a shipment of Lantus should arrive via [employer] to [place] today by 3pm.    Patient's wife communicated understanding and said she would reach out to the office.  Plan:  Send note to patient's provider about approval Notify Daryll Brod of approval Close patient's pharmacy case as he has received his necessary medications

## 2017-03-12 ENCOUNTER — Telehealth: Payer: Self-pay | Admitting: Family Medicine

## 2017-03-12 NOTE — Telephone Encounter (Signed)
I left patient a voicemail letting him know that the Lantus is ready for pick up. It is in the fridge in our pod.

## 2017-03-13 ENCOUNTER — Telehealth: Payer: Self-pay | Admitting: Pharmacist

## 2017-03-13 ENCOUNTER — Other Ambulatory Visit: Payer: Self-pay | Admitting: Pharmacy Technician

## 2017-03-13 NOTE — Telephone Encounter (Signed)
-----   Message from Leroy Libmanrystal D Walker, CPhT sent at 03/13/2017 11:46 AM EDT ----- Claris GowerHello Jeremy Gill,  I spoke with Jeremy Gill today and Lantus is at the provider's office for them to pick up. I have removed myself from the Care Team.  Thanks,  Schering-PloughCrystal

## 2017-03-13 NOTE — Patient Outreach (Signed)
[facility]) Care Management  03/13/2017  Jeremy SalinesJames E Gill 1945-12-09 [id number]   Patient has received both Lantus and Victoza from patient assistance programs.  Plan: Case will be closed.   Beecher McardleKatina J. Danelia Snodgrass, PharmD, BCACP [facility] Clinical Pharmacist [phone]

## 2017-03-13 NOTE — Patient Outreach (Signed)
[facility]) Care Management  03/13/2017  Lazarus SalinesJames E Duman [birth year] [id number]   I spoke with patient's wife Landis Martinsatricia Mccardle about [employer] patient assistance application for Lantus. HIPAA identifiers were verified and verbal consent received. The application was approved on 03/04/2017 and I was just verifying whether or not medication has been received. Mrs. Larence PenningBullard states that the she received the call that the medication was at the provider's office but they have not picked it up as of yet.   PLAN:  Send note to Nunzio CobbsKatina Boyd, Rph to close this patient.  Daryll Brodrystal Cameo Shewell, CPhT [facility] [phone]

## 2017-03-15 ENCOUNTER — Other Ambulatory Visit: Payer: Self-pay | Admitting: Physical Medicine & Rehabilitation

## 2017-03-15 ENCOUNTER — Other Ambulatory Visit: Payer: Self-pay | Admitting: Family Medicine

## 2017-04-03 ENCOUNTER — Other Ambulatory Visit: Payer: Self-pay | Admitting: Physical Medicine & Rehabilitation

## 2017-04-16 ENCOUNTER — Ambulatory Visit: Payer: PPO | Admitting: Family Medicine

## 2017-04-16 ENCOUNTER — Ambulatory Visit: Payer: PPO

## 2017-04-16 ENCOUNTER — Encounter: Payer: Self-pay | Admitting: Family Medicine

## 2017-04-16 VITALS — BP 110/70 | HR 91 | Temp 98.1°F | Resp 16 | Ht 72.0 in | Wt 183.2 lb

## 2017-04-16 DIAGNOSIS — E1149 Type 2 diabetes mellitus with other diabetic neurological complication: Secondary | ICD-10-CM

## 2017-04-16 DIAGNOSIS — I10 Essential (primary) hypertension: Secondary | ICD-10-CM | POA: Diagnosis not present

## 2017-04-16 DIAGNOSIS — E1142 Type 2 diabetes mellitus with diabetic polyneuropathy: Secondary | ICD-10-CM

## 2017-04-16 DIAGNOSIS — Z23 Encounter for immunization: Secondary | ICD-10-CM | POA: Diagnosis not present

## 2017-04-16 NOTE — Patient Instructions (Signed)
A few things to remember from today's visit:   Diabetes mellitus type 2 with neurological manifestations (HCC)  Essential hypertension  Diabetic polyneuropathy associated with type 2 diabetes mellitus (HCC)  Diabetes Mellitus and Food It is important for you to manage your blood sugar (glucose) level. Your blood glucose level can be greatly affected by what you eat. Eating healthier foods in the appropriate amounts throughout the day at about the same time each day will help you control your blood glucose level. It can also help slow or prevent worsening of your diabetes mellitus. Healthy eating may even help you improve the level of your blood pressure and reach or maintain a healthy weight. General recommendations for healthful eating and cooking habits include:  Eating meals and snacks regularly. Avoid going long periods of time without eating to lose weight.  Eating a diet that consists mainly of plant-based foods, such as fruits, vegetables, nuts, legumes, and whole grains.  Using low-heat cooking methods, such as baking, instead of high-heat cooking methods, such as deep frying.  Work with your dietitian to make sure you understand how to use the Nutrition Facts information on food labels. How can food affect me? Carbohydrates Carbohydrates affect your blood glucose level more than any other type of food. Your dietitian will help you determine how many carbohydrates to eat at each meal and teach you how to count carbohydrates. Counting carbohydrates is important to keep your blood glucose at a healthy level, especially if you are using insulin or taking certain medicines for diabetes mellitus. Alcohol Alcohol can cause sudden decreases in blood glucose (hypoglycemia), especially if you use insulin or take certain medicines for diabetes mellitus. Hypoglycemia can be a life-threatening condition. Symptoms of hypoglycemia (sleepiness, dizziness, and disorientation) are similar to symptoms  of having too much alcohol. If your health care provider has given you approval to drink alcohol, do so in moderation and use the following guidelines:  Women should not have more than one drink per day, and men should not have more than two drinks per day. One drink is equal to: ? 12 oz of beer. ? 5 oz of wine. ? 1 oz of hard liquor.  Do not drink on an empty stomach.  Keep yourself hydrated. Have water, diet soda, or unsweetened iced tea.  Regular soda, juice, and other mixers might contain a lot of carbohydrates and should be counted.  What foods are not recommended? As you make food choices, it is important to remember that all foods are not the same. Some foods have fewer nutrients per serving than other foods, even though they might have the same number of calories or carbohydrates. It is difficult to get your body what it needs when you eat foods with fewer nutrients. Examples of foods that you should avoid that are high in calories and carbohydrates but low in nutrients include:  Trans fats (most processed foods list trans fats on the Nutrition Facts label).  Regular soda.  Juice.  Candy.  Sweets, such as cake, pie, doughnuts, and cookies.  Fried foods.  What foods can I eat? Eat nutrient-rich foods, which will nourish your body and keep you healthy. The food you should eat also will depend on several factors, including:  The calories you need.  The medicines you take.  Your weight.  Your blood glucose level.  Your blood pressure level.  Your cholesterol level.  You should eat a variety of foods, including:  Protein. ? Lean cuts of meat. ? Proteins  low in saturated fats, such as fish, egg whites, and beans. Avoid processed meats.  Fruits and vegetables. ? Fruits and vegetables that may help control blood glucose levels, such as apples, mangoes, and yams.  Dairy products. ? Choose fat-free or low-fat dairy products, such as milk, yogurt, and  cheese.  Grains, bread, pasta, and rice. ? Choose whole grain products, such as multigrain bread, whole oats, and brown rice. These foods may help control blood pressure.  Fats. ? Foods containing healthful fats, such as nuts, avocado, olive oil, canola oil, and fish.  Does everyone with diabetes mellitus have the same meal plan? Because every person with diabetes mellitus is different, there is not one meal plan that works for everyone. It is very important that you meet with a dietitian who will help you create a meal plan that is just right for you. This information is not intended to replace advice given to you by your health care provider. Make sure you discuss any questions you have with your health care provider. Document Released: 02/08/2005 Document Revised: 10/20/2015 Document Reviewed: 04/10/2013 Elsevier Interactive Patient Education  2017 [employer]vier Inc.  Please be sure medication list is accurate. If a new problem present, please set up appointment sooner than planned today.

## 2017-04-16 NOTE — Progress Notes (Signed)
HPI:   Mr.Ayyub E Collantes is a 71 y.o. male, who is here today with his wife to follow on some chronic medical problems.   He was last seen on 02/06/17. Hospitalized for acute diabetic encephalopathy and DKA on 01/01/17.  DM II:  He is on Victoza 1.6 mg daily, Metformin 500 mg bid,and Lantus 35 U daily.  He has not been consistent with dietary recommendations. Denies hypoglycemia. FG mid 100's-upper 200's. Afernoon glucose,post prandial: 100-upper 200's, a few 300's and 400's.  + Neuropathic pain LE's. He is on Cymbalta, Neurontin, and Amitriptyline. He follows with pain management.    Lab Results  Component Value Date   HGBA1C 14.1 (H) 11/13/2016   Lab Results  Component Value Date   MICROALBUR 0.7 02/08/2016    HTN:  Home BP readings: "Ok" Eating frozen meals. Last OV Metoprolol Succinate decreased from 25 mg to 12.5 mg because dizzy spells and low BP's. He has not had dizziness. Lisinopril was discontinued during hospitalization due to AKI.  Lab Results  Component Value Date   CREATININE 1.18 01/01/2017   BUN 26 (H) 01/01/2017   NA 138 01/01/2017   K 4.6 01/01/2017   CL 100 01/01/2017   CO2 34 (H) 01/01/2017   Denies severe/frequent headache, visual changes, chest pain, dyspnea, palpitation, claudication, focal weakness, or edema.   Review of Systems  Constitutional: Negative for activity change, appetite change, fatigue and fever.  HENT: Positive for dental problem. Negative for mouth sores, nosebleeds, sore throat and trouble swallowing.   Eyes: Negative for redness and visual disturbance.  Respiratory: Negative for apnea, cough, shortness of breath and wheezing.   Cardiovascular: Negative for chest pain, palpitations and leg swelling.  Gastrointestinal: Negative for abdominal pain, nausea and vomiting.       No changes in bowel habits.  Endocrine: Negative for polydipsia, polyphagia and polyuria.  Genitourinary: Negative for decreased urine  volume, dysuria and hematuria.  Musculoskeletal: Positive for arthralgias, back pain and gait problem.       Hx of OA  Skin: Negative for rash and wound.  Neurological: Positive for numbness. Negative for dizziness, syncope, weakness and headaches.  Psychiatric/Behavioral: Negative for confusion. The patient is nervous/anxious.       Current Outpatient Medications on File Prior to Visit  Medication Sig Dispense Refill  . amitriptyline (ELAVIL) 10 MG tablet TAKE 1 TABLET BY MOUTH AT BEDTIME 30 tablet 2  . aspirin 81 MG chewable tablet Chew 81 mg by mouth 2 (two) times a week. Pt takes on Monday and Friday.    . busPIRone (BUSPAR) 15 MG tablet Take 2 tablets (30 mg total) by mouth 2 (two) times daily. 180 tablet 2  . DULoxetine (CYMBALTA) 60 MG capsule TAKE 1 CAPSULE BY MOUTH EVERY DAY 30 capsule 2  . fluocinonide cream (LIDEX) 0.05 % Apply topically 3 (three) times daily as needed. For itching (Patient taking differently: Apply 1 application topically 3 (three) times daily as needed (for itching). ) 30 g 3  . gabapentin (NEURONTIN) 300 MG capsule TAKE 3 CAPSULES BY MOUTH 3 TIMES DAILY 270 capsule 2  . glucose blood test strip Use to test blood sugar three-four times daily. 400 each 3  . insulin glargine (LANTUS) 100 UNIT/ML injection Inject 0.3 mLs (30 Units total) into the skin at bedtime. (Patient taking differently: Inject 35 Units into the skin at bedtime. ) 15 mL 1  . metFORMIN (GLUCOPHAGE) 500 MG tablet Take 1 tablet (500 mg total) by  mouth 2 (two) times daily with a meal. 60 tablet 3  . methocarbamol (ROBAXIN) 500 MG tablet TAKE 1/2 TABLET BY MOUTH EVERY 8 HOURS AS NEEDED FOR MUSCLE SPASMS 45 tablet 1  . metoprolol succinate (TOPROL-XL) 25 MG 24 hr tablet Take 0.5 tablets (12.5 mg total) by mouth daily. 90 tablet 1  . naproxen (NAPROSYN) 500 MG tablet TAKE 1 TABLET BY MOUTH 2 TIMES DAILY WITH A MEAL 60 tablet 2  . traMADol (ULTRAM) 50 MG tablet TAKE 1 TABLET BY MOUTH EVERY 6 HOURS AS  NEEDED 135 tablet 5  . triamcinolone ointment (KENALOG) 0.1 % Apply topically 2 (two) times daily. 1:1 compound with Eucerin (Patient taking differently: Apply 1 application topically 2 (two) times daily. Compounded as a 1:1 mixture with Eucerin.) 453.6 g 3  . VICTOZA 18 MG/3ML SOPN Inject 0.6 mg subcutaneously into the skin daily, increase to 1.2 mg in a week if tolerated. 9 mL 2  . atorvastatin (LIPITOR) 40 MG tablet Take 1 tablet (40 mg total) by mouth daily. 90 tablet 3   No current facility-administered medications on file prior to visit.      Past Medical History:  Diagnosis Date  . ANKLE SPRAIN 12/16/2006   Qualifier: Diagnosis of  By: Tawanna Coolerodd MD, Eugenio HoesJeffrey A   . ANXIETY 10/25/2006  . CELLULITIS, LEG, RIGHT 09/08/2007   Qualifier: Diagnosis of  By: Tawanna Coolerodd MD, Eugenio HoesJeffrey A   . Chronic pain disorder 11/30/2015  . COPD 10/25/2006  . 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 DIABETES MELLITUS, TYPE I 10/25/2006  . Diabetic polyneuropathy (HCC) 04/08/2007   Qualifier: Diagnosis of  By: Tawanna Coolerodd MD, Eugenio HoesJeffrey A   . 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 Essential hypertension 10/25/2006   Qualifier: Diagnosis of  By: Rosette Revealempleton, RN, Jorene MinorsAngela Dawn   . Generalized osteoarthritis of multiple sites 11/30/2015  . HYPERTENSION 10/25/2006  . 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 Low back pain with radiation 01/23/2016  . Polyneuropathy in diabetes(357.2) 04/08/2007  . Psoriasis   . TOBACCO ABUSE 07/08/2007  . VENOUS INSUFFICIENCY 11/25/2007   No Known Allergies  Social History   Socioeconomic History  . Marital status: Married    Spouse name: None  . Number of children: None  . Years of education: None  . Highest education level: None  Social Needs  . Financial resource strain: None  . Food insecurity - worry: None  . Food insecurity - inability: None  . Transportation  needs - medical: None  . Transportation needs - non-medical: None  Occupational History  . Occupation: Self Employed  Tobacco Use  . Smoking status: Current Every Day Smoker    Types: Pipe    Last attempt to quit: 05/29/2011    Years since quitting: 5.9  . Smokeless tobacco: Never Used  Substance and Sexual Activity  . Alcohol use: No  . Drug use: No  . Sexual activity: None  Other Topics Concern  . None  Social History Narrative   Regular exercise-no    Vitals:   04/16/17 1455  BP: 110/70  Pulse: 91  Resp: 16  Temp: 98.1 F (36.7 C)  SpO2: 98%   Body mass index is 24.85 kg/m.   Physical Exam  Nursing note and vitals reviewed. Constitutional: He is oriented to person, place, and time. He appears well-developed. No distress.  HENT:  Head: Normocephalic and atraumatic.  Mouth/Throat: Oropharynx is clear and moist and mucous membranes are normal.  Poor dentition.  Eyes: Conjunctivae are normal. Pupils are equal, round, and reactive to light.  Cardiovascular: Normal rate and regular rhythm.  No murmur heard. DP pulses present bilateral.  Respiratory: Effort normal and breath sounds normal. No respiratory distress.  GI: Soft. He exhibits no mass. There is no hepatomegaly. There is no tenderness.  Musculoskeletal: He exhibits no edema.  Lymphadenopathy:    He has no cervical adenopathy.  Neurological: He is alert and oriented to person, place, and time. He has normal strength.  Stable gait, assisted with a cane.  Skin: Skin is warm. No erythema.  Psychiatric: He has a normal mood and affect. Cognition and memory are normal.  Well groomed, good eye contact.     ASSESSMENT AND PLAN:   Mr. Colman was seen today for medication management.  Diagnoses and all orders for this visit:  Lab Results  Component Value Date   HGBA1C 11.1 (H) 04/16/2017   Lab Results  Component Value Date   CREATININE 0.85 04/16/2017   BUN 17 04/16/2017   NA 138 04/16/2017   K 4.2  04/16/2017   CL 100 04/16/2017   CO2 30 04/16/2017   Lab Results  Component Value Date   MICROALBUR <0.7 04/16/2017    Diabetes mellitus type 2 with neurological manifestations (HCC)  HgA1C has not been at goal. No changes in current management, will adjust treatment according to HgA1C. Educated about possible complications of poorly controlled DM. Because hypotension he is not on ACEI or ARB.  Regular exercise and healthy diet with avoidance of added sugar food intake is an important part of treatment and recommended. At least annual eye exam (overdue) , periodic dental and foot care recommended. F/U in 4 months  -     Basic metabolic panel -     Hemoglobin A1c -     Microalbumin / creatinine urine ratio  Essential hypertension  Adequately controlled. No changes in current management. DASH diet recommended. Overdue for eye exam. F/U in 4 months, before if needed.  Diabetic polyneuropathy associated with type 2 diabetes mellitus (HCC)  Stable. Foot and LE skin care discussed. No changes in current management. Better controlled DM may help also.  Need for influenza vaccination -     Flu vaccine HIGH DOSE PF    In regard to health maintenance he has refused colonoscopy and reporting abdomina US done and negative for AAA.   -Mr. Jaiveon Suppes Madrazo was advised to return sooner than planned today if new concerns arise.       Previn Jian G. [place], MD  [facility]. [place] office.

## 2017-04-17 LAB — MICROALBUMIN / CREATININE URINE RATIO
Creatinine,U: 38.2 mg/dL
Microalb Creat Ratio: 1.8 mg/g (ref 0.0–30.0)

## 2017-04-17 LAB — BASIC METABOLIC PANEL
BUN: 17 mg/dL (ref 6–23)
CALCIUM: 9.3 mg/dL (ref 8.4–10.5)
CHLORIDE: 100 meq/L (ref 96–112)
CO2: 30 meq/L (ref 19–32)
CREATININE: 0.85 mg/dL (ref 0.40–1.50)
GFR: 94.28 mL/min (ref >60.00)
GLUCOSE: 215 mg/dL — AB (ref 70–99)
Potassium: 4.2 mEq/L (ref 3.5–5.1)
Sodium: 138 mEq/L (ref 135–145)

## 2017-04-17 LAB — HEMOGLOBIN A1C: Hgb A1c MFr Bld: 11.1 % — ABNORMAL HIGH (ref 4.6–6.5)

## 2017-04-21 ENCOUNTER — Encounter: Payer: Self-pay | Admitting: Family Medicine

## 2017-04-21 MED ORDER — METFORMIN HCL 500 MG PO TABS: 1000.0000 mg | ORAL_TABLET | Freq: Two times a day (BID) | ORAL | 4 refills | Status: DC

## 2017-04-29 ENCOUNTER — Other Ambulatory Visit: Payer: Self-pay | Admitting: Physical Medicine & Rehabilitation

## 2017-05-31 ENCOUNTER — Other Ambulatory Visit: Payer: Self-pay | Admitting: Pharmacist

## 2017-05-31 ENCOUNTER — Other Ambulatory Visit: Payer: Self-pay | Admitting: Family Medicine

## 2017-05-31 DIAGNOSIS — R000 Tachycardia, unspecified: Secondary | ICD-10-CM

## 2017-05-31 DIAGNOSIS — I10 Essential (primary) hypertension: Secondary | ICD-10-CM

## 2017-05-31 NOTE — Patient Outreach (Signed)
[facility] Care Management  05/31/2017  Lazarus SalinesJames E Crafts [birth year] [id number]   Patient's wife Elease Hashimoto(Patricia), called to inquire about a letter she received from [employer] stating she had "other insurance from [employer]".  Patient was instructed on how to complete the form and was educated on the out-of-pocket spend that required each year from [employer].  Patient's wife communicated understanding.  Plan:  [facility] when his out-of-pocket spend gets closer to $1000.   Beecher McardleKatina J. Leontyne Manville, PharmD, BCACP [facility] Clinical Pharmacist [phone]

## 2017-06-05 ENCOUNTER — Other Ambulatory Visit: Payer: Self-pay | Admitting: Family Medicine

## 2017-06-05 DIAGNOSIS — F17200 Nicotine dependence, unspecified, uncomplicated: Secondary | ICD-10-CM

## 2017-06-14 ENCOUNTER — Encounter: Payer: Self-pay | Admitting: Physical Medicine & Rehabilitation

## 2017-06-14 ENCOUNTER — Encounter: Payer: PPO | Attending: Physical Medicine & Rehabilitation | Admitting: Physical Medicine & Rehabilitation

## 2017-06-14 VITALS — BP 112/62 | HR 99

## 2017-06-14 DIAGNOSIS — E538 Deficiency of other specified B group vitamins: Secondary | ICD-10-CM | POA: Insufficient documentation

## 2017-06-14 DIAGNOSIS — K5900 Constipation, unspecified: Secondary | ICD-10-CM | POA: Diagnosis not present

## 2017-06-14 DIAGNOSIS — F17200 Nicotine dependence, unspecified, uncomplicated: Secondary | ICD-10-CM | POA: Diagnosis not present

## 2017-06-14 DIAGNOSIS — M545 Low back pain, unspecified: Secondary | ICD-10-CM

## 2017-06-14 DIAGNOSIS — I10 Essential (primary) hypertension: Secondary | ICD-10-CM | POA: Insufficient documentation

## 2017-06-14 DIAGNOSIS — G8929 Other chronic pain: Secondary | ICD-10-CM | POA: Insufficient documentation

## 2017-06-14 DIAGNOSIS — E1142 Type 2 diabetes mellitus with diabetic polyneuropathy: Secondary | ICD-10-CM | POA: Insufficient documentation

## 2017-06-14 DIAGNOSIS — J449 Chronic obstructive pulmonary disease, unspecified: Secondary | ICD-10-CM | POA: Insufficient documentation

## 2017-06-14 DIAGNOSIS — G479 Sleep disorder, unspecified: Secondary | ICD-10-CM | POA: Diagnosis not present

## 2017-06-14 DIAGNOSIS — R269 Unspecified abnormalities of gait and mobility: Secondary | ICD-10-CM

## 2017-06-14 DIAGNOSIS — G894 Chronic pain syndrome: Secondary | ICD-10-CM

## 2017-06-14 MED ORDER — BACLOFEN 10 MG PO TABS: 10.0000 mg | ORAL_TABLET | Freq: Three times a day (TID) | ORAL | 0 refills | Status: DC

## 2017-06-14 MED ORDER — DICLOFENAC POTASSIUM 50 MG PO TABS: 50.0000 mg | ORAL_TABLET | Freq: Three times a day (TID) | ORAL | 1 refills | Status: DC

## 2017-06-14 MED ORDER — GABAPENTIN 600 MG PO TABS: 1200.0000 mg | ORAL_TABLET | Freq: Three times a day (TID) | ORAL | 1 refills | Status: DC

## 2017-06-14 NOTE — Progress Notes (Signed)
Noted a discrepancy in patients medication count today for his tramadol.  Saw he was dispensed 135 tabs on 05-31-17 and came in with empty bottle.  Called his number after his appointment and spoke with his wife about the count and was informed by her that he empted out his bottles prior to traveling as he doesn't like to carry medications on him.  Informed her to pass message to him that states from this time forward to please bring bottle and medication with him to all future appointments as it has to be counted as well.

## 2017-06-14 NOTE — Progress Notes (Signed)
Subjective:    Patient ID: Jeremy SalinesJames E Dipierro, male    DOB: 14-Apr-1946, 72 y.o.   MRN: [id number]  HPI  72 y/o male with pmh of diabetic polyneuropathy, OA, chronic pain, tobacco abuse, HTN, COPD presents follow up chronic pain >low back, midline.   Initially, pt stated it started 11/26/15 denies inciting event.  Getting progressively worse.  Pain meds improve the pain. No pain meds alleviate the pain.  Mostly dull pain. Non-radiating.  Constant.  He saw Ortho spine for evaluation of surgery.  He was referred to PT, which he completed.  It helped a little.  He exercises daily. He tried hydrocodone for 12 years, but it was stopped by her PCP.  Denies associated weakness and numbness.  He has fallen twice in the year prior to eval because he was not using his cane.  Pain limits pt from doing "normal things".    Last clinic visit 12/13/16.  He states his pain has gotten worse since last visit. He states he is going to join a gym this month. TENS without benefit. Sleep is well.  His constipation has resolved.   Pain Inventory Average Pain 10 Pain Right Now 10 My pain is sharp, dull, stabbing and aching  In the last 24 hours, has pain interfered with the following? General activity 10 Relation with others 10 Enjoyment of life 10 What TIME of day is your pain at its worst? daytime Sleep (in general) Good  Pain is worse with: walking, bending and standing Pain improves with: rest and medication Relief from Meds: 2  Mobility walk with assistance use a cane ability to climb steps?  yes do you drive?  yes  Function retired  Neuro/Psych bowel control problems weakness numbness tremor trouble walking  Prior Studies Any changes since last visit?  no   Physicians involved in care Any changes since last visit?  no   Family History  Problem Relation Age of Onset  . Cancer Mother        Unknown   . Diabetes Sister    Social History   Socioeconomic History  . Marital status:  Married    Spouse name: None  . Number of children: None  . Years of education: None  . Highest education level: None  Social Needs  . Financial resource strain: None  . Food insecurity - worry: None  . Food insecurity - inability: None  . Transportation needs - medical: None  . Transportation needs - non-medical: None  Occupational History  . Occupation: Self Employed  Tobacco Use  . Smoking status: Current Every Day Smoker    Types: Pipe    Last attempt to quit: 05/29/2011    Years since quitting: 6.0  . Smokeless tobacco: Never Used  Substance and Sexual Activity  . Alcohol use: No  . Drug use: No  . Sexual activity: None  Other Topics Concern  . None  Social History Narrative   Regular exercise-no   Past Surgical History:  Procedure Laterality Date  . TESTICLE REMOVAL     R testicle   Past Medical History:  Diagnosis Date  . ANKLE SPRAIN 12/16/2006   Qualifier: Diagnosis of  By: Tawanna Coolerodd MD, Eugenio HoesJeffrey A   . ANXIETY 10/25/2006  . CELLULITIS, LEG, RIGHT 09/08/2007   Qualifier: Diagnosis of  By: Tawanna Coolerodd MD, Eugenio HoesJeffrey A   . Chronic pain disorder 11/30/2015  . COPD 10/25/2006  . Diabetes mellitus type 2 with neurological manifestations (HCC) 10/25/2006   Qualifier: Diagnosis of  By: Rosette Reveal, RN, Jorene Minors   . DIABETES MELLITUS, TYPE I 10/25/2006  . Diabetic polyneuropathy (HCC) 04/08/2007   Qualifier: Diagnosis of  By: Tawanna Cooler MD, Eugenio Hoes   . 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 Essential hypertension 10/25/2006   Qualifier: Diagnosis of  By: Rosette Reveal RN, Jorene Minors   . Generalized osteoarthritis of multiple sites 11/30/2015  . HYPERTENSION 10/25/2006  . 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 Low back pain with radiation 01/23/2016  . Polyneuropathy in diabetes(357.2) 04/08/2007  . Psoriasis   . TOBACCO ABUSE 07/08/2007  . VENOUS INSUFFICIENCY 11/25/2007   BP 112/62   Pulse 99   SpO2 98%   Opioid Risk Score:   Fall Risk Score:   `1  Depression screen PHQ 2/9  Depression screen [facility] 2/9 12/13/2016 10/30/2016 02/16/2016 02/08/2016 01/19/2014  Decreased Interest 0 (No Data) 2 0 0  Down, Depressed, Hopeless 0 0 1 0 0  PHQ - 2 Score 0 0 3 0 0  Altered sleeping - - 1 - -  Tired, decreased energy - - 2 - -  Change in appetite - - 1 - -  Feeling bad or failure about yourself  - - 0 - -  Trouble concentrating - - 0 - -  Moving slowly or fidgety/restless - - 0 - -  Suicidal thoughts - - 0 - -  PHQ-9 Score - - 7 - -  Difficult doing work/chores - - Somewhat difficult - -    Review of Systems  Constitutional: Negative.  Negative for chills and fever.  HENT: Negative.   Eyes: Negative.   Respiratory: Negative.   Cardiovascular: Negative.   Gastrointestinal: Negative.   Endocrine:       High blood sugar    Genitourinary: Negative.   Musculoskeletal: Positive for arthralgias, back pain and gait problem.       Spasms   Skin: Negative.   Allergic/Immunologic: Negative.   Neurological: Positive for numbness. Negative for dizziness.       Tingling    Hematological: Negative.   Psychiatric/Behavioral: Negative.   All other systems reviewed and are negative.     Objective:   Physical Exam Gen: NAD. Vital signs reviewed HENT: Normocephalic, Atraumatic Eyes: EOMI. No discharge.  Cardio: RRR. No JVD.  Pulm: B/l clear to auscultation.  Effort normal Abd: Soft, BS+ MSK:  Gait antalgic with cane.   No TTP.    No edema.  Neuro:    Strength  4+/5 in all LE myotomes (stable) Skin: Warm and Dry.  Intact. Psych: Normal mood and affect.     Assessment & Plan:  72 y/o male with pmh of diabetic polyneuropathy, OA, chronic pain, tobacco abuse, HTN, COPD presents for follow up of chronic pain >low back, midline.   1.  Chronic mechanical low back pain  Xray L-spine reviewed, mild degenerative changes.   Tylenol, TENS ineffective for pt  Mobic 15mg  d/ced due to lack of efficacy  Pt released by Ortho  Pt completed PT,  cont HEP.  Pt to join gym.  Cont ice  Will increase Gabapentin to 1200 TID  Cont Cymbalta to 60mg   Cont Tramadol 50 to 4/day (educated and discussed signs/symptoms serotonin syndrome and risk of seizures)  Will change Robaxin 250 BID PRN to Baclofen 10 TID due to cost  Will change Naproxen 500 BID PRN to Diclofenac 50 TID, due to pt preference to take medication TID  Cont Lidoderm PRN  Cont Elavil to 10qhs  Encouraged physical activity  2. Diabetic polyneuropathy  Gabapentin increased to 1200 TID  3. Gait abnormality  Cont cane for safety, pt states good support with current single point cane  Encouraged compliance with diabetic footware  4. Sleep disturbance  Cont Elavil 10qhs  5. Constipation  Resolved  6. Vitamin B12 deficiency  Recommended supplementation with daily

## 2017-07-01 ENCOUNTER — Other Ambulatory Visit: Payer: Self-pay | Admitting: Physical Medicine & Rehabilitation

## 2017-07-11 ENCOUNTER — Encounter: Payer: PPO | Admitting: Physical Medicine & Rehabilitation

## 2017-07-17 ENCOUNTER — Telehealth: Payer: Self-pay | Admitting: Family Medicine

## 2017-07-17 ENCOUNTER — Other Ambulatory Visit: Payer: Self-pay | Admitting: Family Medicine

## 2017-07-17 ENCOUNTER — Other Ambulatory Visit: Payer: Self-pay | Admitting: Pharmacist

## 2017-07-17 DIAGNOSIS — E1149 Type 2 diabetes mellitus with other diabetic neurological complication: Secondary | ICD-10-CM

## 2017-07-17 MED ORDER — INSULIN GLARGINE 100 UNIT/ML ~~LOC~~ SOLN: 40.0000 [IU] | Freq: Every day | SUBCUTANEOUS | 2 refills | Status: DC

## 2017-07-17 NOTE — Patient Outreach (Signed)
[facility]) Care Management  [facility]   07/17/2017  Jeremy Gill 12/14/1945 [id number]  Subjective: Received a phone call from the patient's wife regarding medication assistance. Patient's wife is on his HIPAA and was the main contact for patient assistance forms last year. Patient is a 72 year old male with multiple medical conditions including but not limited to:  Type 2 diabetes, COPD, hypertension, chronic pain, anxiety, and osteoarthritis.     Patient's wife remembered that [facility] helped them with affording Lantus and Victoza last year and wondered if we could help them this year.     Objective:   Encounter Medications: Outpatient Encounter Medications as of 07/17/2017  Medication Sig Note  . amitriptyline (ELAVIL) 10 MG tablet TAKE 1 TABLET BY MOUTH AT BEDTIME   . aspirin 81 MG chewable tablet Chew 81 mg by mouth 2 (two) times a week. Pt takes on Monday and Friday.   . baclofen (LIORESAL) 10 MG tablet Take 1 tablet (10 mg total) by mouth 3 (three) times daily.   . busPIRone (BUSPAR) 15 MG tablet TAKE 2 TABLETS BY MOUTH 2 TIMES DAILY   . diclofenac (CATAFLAM) 50 MG tablet Take 1 tablet (50 mg total) by mouth 3 (three) times daily.   . DULoxetine (CYMBALTA) 60 MG capsule TAKE 1 CAPSULE BY MOUTH EVERY DAY   . fluocinonide cream (LIDEX) 0.05 % Apply topically 3 (three) times daily as needed. For itching (Patient taking differently: Apply 1 application topically 3 (three) times daily as needed (for itching). )   . glucose blood test strip Use to test blood sugar three-four times daily.   . insulin glargine (LANTUS) 100 UNIT/ML injection Inject 0.3 mLs (30 Units total) into the skin at bedtime. (Patient taking differently: Inject 40 Units into the skin at bedtime. )   . metFORMIN (GLUCOPHAGE) 500 MG tablet Take 2 tablets (1,000 mg total) by mouth 2 (two) times daily with a meal.   . metoprolol succinate (TOPROL-XL) 25 MG 24 hr tablet Take 0.5 tablets (12.5 mg  total) by mouth daily.   . traMADol (ULTRAM) 50 MG tablet TAKE 1 TABLET BY MOUTH EVERY 6 HOURS AS NEEDED 06/14/2017: 05-31-17     D#135      V#0      last dose this AM  . triamcinolone ointment (KENALOG) 0.1 % Apply topically 2 (two) times daily. 1:1 compound with Eucerin (Patient taking differently: Apply 1 application topically 2 (two) times daily. Compounded as a 1:1 mixture with Eucerin.)   . VICTOZA 18 MG/3ML SOPN Inject 0.6 mg subcutaneously into the skin daily, increase to 1.2 mg in a week if tolerated.   Marland Kitchen atorvastatin (LIPITOR) 40 MG tablet Take 1 tablet (40 mg total) by mouth daily.   Marland Kitchen gabapentin (NEURONTIN) 600 MG tablet Take 2 tablets (1,200 mg total) by mouth 3 (three) times daily.    No facility-administered encounter medications on file as of 07/17/2017.     Functional Status: In your present state of health, do you have any difficulty performing the following activities: 12/18/2016  Hearing? N  Vision? N  Difficulty concentrating or making decisions? Y  Walking or climbing stairs? Y  Dressing or bathing? Y  Doing errands, shopping? N  Comment normally does not have trouble driving  Some recent data might be hidden    Fall/Depression Screening: Fall Risk  06/14/2017 12/26/2016 12/13/2016  Falls in the past year? Yes No Yes  Comment - - no recent  Number falls in  past yr: 1 - 1  Injury with Fall? No - -  Risk for fall due to : Mental status change;Other (Comment) - -   PHQ 2/9 Scores 12/26/2016 12/13/2016 10/30/2016 02/16/2016 02/08/2016 01/19/2014  PHQ - 2 Score - 0 0 3 0 0  PHQ- 9 Score - - - 7 - -  Exception Documentation Other- indicate reason in comment box - - - - -  Not completed call completed with spouse - - - - -      Assessment: Patient's medications were reviewed via telephone:  Drugs sorted by  system:  Neurologic/Psychologic: Amitriptyline Buspirone Duloxetine Gabapentin  Cardiovascular: Aspirin Atorvastatin Metoprolol   Pulmonary/Allergy:  Gastrointestinal:  Endocrine: Lantus Victoza Metformin Renal:  Topical: Triamcinolone Fluocinonide  (does not use both steroid creams together)  Pain: Baclofen Diclofenac Tramadol  Medication Assistance Findings: -patient is over income for the Extra Help Program -patient has [employer] -patient is most likely eligible for [employer]'s program for Lantus and [employer]'s program for Victoza but both programs require patients to spend $1000 in out-of-pocket medication expenses. -patient's wife understands that she will need to hold on the paperwork being sent to her and call us back when patient is closer to spending the required amount out-of-pocket. -patient's wife was educated to keep all EOBs from [employer]   Plan: Route note to Health NetCrystal Walker, CPhT to send applications for Victoza and Lantus to the patient.

## 2017-07-17 NOTE — Telephone Encounter (Unsigned)
Copied from CRM [phone]. Topic: Quick Communication - See Telephone Encounter >> Jul 17, 2017  1:30 PM Waymon AmatoBurton, Donna F wrote: CRM for notification. See Telephone encounter for:  Pt wife is calling for a refill on his amitiriptyline and also lantus but jordon was not the one who orginally filled this but knew he was taking it but he got it thru a patient assistant program and the wife is not sure how to get it refilled any other way   Best number [phone] [facility]  [phone] 07/17/17.

## 2017-07-17 NOTE — Telephone Encounter (Signed)
Patient informed that Lantus was refilled, but he would have to contact Dr. Allena KatzPatel to get Amitriptyline refilled. Patient's wife verbalized understanding.

## 2017-07-17 NOTE — Telephone Encounter (Signed)
Amitriptyline and Lantus refill Last OV: 04/16/17 Last Refill:Amitriptylline 01/03/17 30 tab/2 refills; Lantus 01/01/17 1refill-both by another provider Pharmacy:[facility] [phone]

## 2017-07-17 NOTE — Telephone Encounter (Signed)
Amitriptyline is prescribed by Dr Allena KatzPatel, pain management. I am sending Lantus.  Thanks, BJ

## 2017-07-18 ENCOUNTER — Encounter: Payer: PPO | Attending: Physical Medicine & Rehabilitation | Admitting: Physical Medicine & Rehabilitation

## 2017-07-18 ENCOUNTER — Encounter: Payer: Self-pay | Admitting: Physical Medicine & Rehabilitation

## 2017-07-18 VITALS — BP 144/76 | HR 85

## 2017-07-18 DIAGNOSIS — M545 Low back pain, unspecified: Secondary | ICD-10-CM

## 2017-07-18 DIAGNOSIS — R269 Unspecified abnormalities of gait and mobility: Secondary | ICD-10-CM

## 2017-07-18 DIAGNOSIS — Z5181 Encounter for therapeutic drug level monitoring: Secondary | ICD-10-CM | POA: Diagnosis not present

## 2017-07-18 DIAGNOSIS — Z79899 Other long term (current) drug therapy: Secondary | ICD-10-CM

## 2017-07-18 DIAGNOSIS — F17200 Nicotine dependence, unspecified, uncomplicated: Secondary | ICD-10-CM | POA: Diagnosis not present

## 2017-07-18 DIAGNOSIS — G8929 Other chronic pain: Secondary | ICD-10-CM | POA: Diagnosis not present

## 2017-07-18 DIAGNOSIS — I10 Essential (primary) hypertension: Secondary | ICD-10-CM | POA: Diagnosis not present

## 2017-07-18 DIAGNOSIS — G894 Chronic pain syndrome: Secondary | ICD-10-CM

## 2017-07-18 DIAGNOSIS — G479 Sleep disorder, unspecified: Secondary | ICD-10-CM | POA: Diagnosis not present

## 2017-07-18 DIAGNOSIS — E1142 Type 2 diabetes mellitus with diabetic polyneuropathy: Secondary | ICD-10-CM

## 2017-07-18 DIAGNOSIS — K5900 Constipation, unspecified: Secondary | ICD-10-CM | POA: Insufficient documentation

## 2017-07-18 DIAGNOSIS — E538 Deficiency of other specified B group vitamins: Secondary | ICD-10-CM | POA: Diagnosis not present

## 2017-07-18 DIAGNOSIS — J449 Chronic obstructive pulmonary disease, unspecified: Secondary | ICD-10-CM | POA: Diagnosis not present

## 2017-07-18 NOTE — Progress Notes (Signed)
Subjective:    Patient ID: Jeremy Gill, male    DOB: Mar 25, 1946, 72 y.o.   MRN: [id number]  HPI  72 y/o male with pmh of diabetic polyneuropathy, OA, chronic pain, tobacco abuse, HTN, COPD presents follow up chronic pain >low back, midline.   Initially, pt stated it started 11/26/15 denies inciting event.  Getting progressively worse.  Pain meds improve the pain. No pain meds alleviate the pain.  Mostly dull pain. Non-radiating.  Constant.  He saw Ortho spine for evaluation of surgery.  He was referred to PT, which he completed.  It helped a little.  He exercises daily. He tried hydrocodone for 12 years, but it was stopped by her PCP.  Denies associated weakness and numbness.  He has fallen twice in the year prior to eval because he was not using his cane.  Pain limits pt from doing "normal things".    Last clinic visit 06/14/17.  Since that time, pt states he still has not joined the gym.  He had some improvement with the increase in Gabapentin.  The Baclofen and Diclofenac are also working well together.  He notes improvement in pain overall. Sleep has improved.  He started taking Vit B12 supplements.   Pain Inventory Average Pain 5 Pain Right Now 5 My pain is tingling  In the last 24 hours, has pain interfered with the following? General activity 5 Relation with others 5 Enjoyment of life 5 What TIME of day is your pain at its worst? evening Sleep (in general) Fair  Pain is worse with: some activites Pain improves with: medication Relief from Meds: 5  Mobility use a cane ability to climb steps?  yes do you drive?  yes  Function retired  Neuro/Psych No problems in this area trouble walking  Prior Studies Any changes since last visit?  no   Physicians involved in care Any changes since last visit?  no   Family History  Problem Relation Age of Onset  . Cancer Mother        Unknown   . Diabetes Sister    Social History   Socioeconomic History  . Marital status:  Married    Spouse name: None  . Number of children: None  . Years of education: None  . Highest education level: None  Social Needs  . Financial resource strain: None  . Food insecurity - worry: None  . Food insecurity - inability: None  . Transportation needs - medical: None  . Transportation needs - non-medical: None  Occupational History  . Occupation: Self Employed  Tobacco Use  . Smoking status: Current Every Day Smoker    Types: Pipe    Last attempt to quit: 05/29/2011    Years since quitting: 6.1  . Smokeless tobacco: Never Used  Substance and Sexual Activity  . Alcohol use: No  . Drug use: No  . Sexual activity: None  Other Topics Concern  . None  Social History Narrative   Regular exercise-no   Past Surgical History:  Procedure Laterality Date  . TESTICLE REMOVAL     R testicle   Past Medical History:  Diagnosis Date  . ANKLE SPRAIN 12/16/2006   Qualifier: Diagnosis of  By: Tawanna Cooler MD, Eugenio Hoes   . ANXIETY 10/25/2006  . CELLULITIS, LEG, RIGHT 09/08/2007   Qualifier: Diagnosis of  By: Tawanna Cooler MD, Eugenio Hoes   . Chronic pain disorder 11/30/2015  . COPD 10/25/2006  . Diabetes mellitus type 2 with neurological manifestations (HCC) 10/25/2006  Qualifier: Diagnosis of  By: Rosette Reveal RN, Jorene Minors   . DIABETES MELLITUS, TYPE I 10/25/2006  . Diabetic polyneuropathy (HCC) 04/08/2007   Qualifier: Diagnosis of  By: Tawanna Cooler MD, Eugenio Hoes   . 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 Essential hypertension 10/25/2006   Qualifier: Diagnosis of  By: Rosette Reveal RN, Jorene Minors   . Generalized osteoarthritis of multiple sites 11/30/2015  . HYPERTENSION 10/25/2006  . 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 Low back pain with radiation 01/23/2016  . Polyneuropathy in diabetes(357.2) 04/08/2007  . Psoriasis   . TOBACCO ABUSE 07/08/2007  . VENOUS INSUFFICIENCY 11/25/2007   BP (!) 144/76   Pulse 85   SpO2 99%   Opioid Risk Score:   Fall Risk Score:   `1  Depression screen PHQ 2/9  Depression screen [facility] 2/9 12/13/2016 10/30/2016 02/16/2016 02/08/2016 01/19/2014  Decreased Interest 0 (No Data) 2 0 0  Down, Depressed, Hopeless 0 0 1 0 0  PHQ - 2 Score 0 0 3 0 0  Altered sleeping - - 1 - -  Tired, decreased energy - - 2 - -  Change in appetite - - 1 - -  Feeling bad or failure about yourself  - - 0 - -  Trouble concentrating - - 0 - -  Moving slowly or fidgety/restless - - 0 - -  Suicidal thoughts - - 0 - -  PHQ-9 Score - - 7 - -  Difficult doing work/chores - - Somewhat difficult - -    Review of Systems  Constitutional: Negative.  Negative for chills and fever.  HENT: Negative.   Eyes: Negative.   Respiratory: Negative.   Cardiovascular: Negative.   Gastrointestinal: Negative.   Endocrine:       High blood sugar    Genitourinary: Negative.   Musculoskeletal: Negative.        Spasms   Skin: Negative.   Allergic/Immunologic: Negative.   Neurological: Negative.  Negative for dizziness.       Tingling    Hematological: Negative.   Psychiatric/Behavioral: Negative.   All other systems reviewed and are negative.     Objective:   Physical Exam Gen: NAD. Vital signs reviewed HENT: Normocephalic, Atraumatic Eyes: EOMI. No discharge.  Cardio: RRR. No JVD.  Pulm: B/l clear to auscultation.  Effort normal Abd: Soft, BS+ MSK:  Gait antalgic with cane.   No TTP.    No edema.  Neuro:    Strength  4+/5 in all LE myotomes (stable) Skin: Warm and Dry.  Intact. Psych: Normal mood and affect.     Assessment & Plan:  72 y/o male with pmh of diabetic polyneuropathy, OA, chronic pain, tobacco abuse, HTN, COPD presents for follow up of chronic pain >low back, midline.   1.  Chronic mechanical low back pain  Xray L-spine reviewed, mild degenerative changes.   Tylenol, TENS ineffective for pt  Mobic  d/ced due to lack of efficacy  Naproxen changed due to preference for TID medication  Robaxin changed due to cost  Pt  released by Ortho  Pt completed PT, cont HEP.  Pt to join gym.  Cont ice  Cont Gabapentin to 1200 TID  Cont Cymbalta to   Cont Tramadol 50 to 4/day (educated and discussed signs/symptoms serotonin syndrome and risk of seizures)  Cont Baclofen 10 TID due to cost  Cont Diclofenac 50 TID  Cont Lidoderm PRN  Cont Elavil to 10qhs  Encouraged physical  activity  2. Diabetic polyneuropathy  Cont Gabapentin 1200 TID  3. Gait abnormality  Cont cane for safety, pt states good support with current single point cane  Encouraged compliance with diabetic footware  4. Sleep disturbance  Cont Elavil 10qhs  5. Vitamin B12 deficiency  Recommended supplementation with daily

## 2017-07-18 NOTE — Addendum Note (Signed)
Addended by: Barbee ShropshireBRIGHT, Mako Pelfrey B on: 07/18/2017 04:12 PM   Modules accepted: Orders

## 2017-07-22 LAB — DRUG TOX ALC METAB W/CON, ORAL FLD: Alcohol Metabolite: NEGATIVE ng/mL (ref <25)

## 2017-07-22 LAB — DRUG TOX MONITOR 1 W/CONF, ORAL FLD
Amphetamines: NEGATIVE ng/mL (ref <10)
BENZODIAZEPINES: NEGATIVE ng/mL (ref <0.50)
BUPRENORPHINE: NEGATIVE ng/mL (ref <0.10)
Barbiturates: NEGATIVE ng/mL (ref <10)
COCAINE: NEGATIVE ng/mL (ref <5.0)
COTININE: 39.6 ng/mL — AB (ref <5.0)
FENTANYL: NEGATIVE ng/mL (ref <0.10)
HEROIN METABOLITE: NEGATIVE ng/mL (ref <1.0)
MARIJUANA: NEGATIVE ng/mL (ref <2.5)
MDMA: NEGATIVE ng/mL (ref <10)
MEPROBAMATE: NEGATIVE ng/mL (ref <2.5)
Methadone: NEGATIVE ng/mL (ref <5.0)
NICOTINE METABOLITE: POSITIVE ng/mL — AB (ref <5.0)
OPIATES: NEGATIVE ng/mL (ref <2.5)
Phencyclidine: NEGATIVE ng/mL (ref <10)
Tapentadol: NEGATIVE ng/mL (ref <5.0)
Tramadol: >500 ng/mL — ABNORMAL HIGH (ref <5.0)
Tramadol: POSITIVE ng/mL — AB (ref <5.0)
Zolpidem: NEGATIVE ng/mL (ref <5.0)

## 2017-07-23 ENCOUNTER — Other Ambulatory Visit: Payer: Self-pay | Admitting: *Deleted

## 2017-07-23 DIAGNOSIS — E1149 Type 2 diabetes mellitus with other diabetic neurological complication: Secondary | ICD-10-CM

## 2017-07-23 MED ORDER — INSULIN GLARGINE 100 UNIT/ML ~~LOC~~ SOLN: 40.0000 [IU] | Freq: Every day | SUBCUTANEOUS | 2 refills | Status: DC

## 2017-07-23 MED ORDER — LIRAGLUTIDE 18 MG/3ML ~~LOC~~ SOPN: 0.6000 mg | PEN_INJECTOR | Freq: Every day | SUBCUTANEOUS | 2 refills | Status: DC

## 2017-07-24 ENCOUNTER — Telehealth: Payer: Self-pay | Admitting: *Deleted

## 2017-07-24 NOTE — Telephone Encounter (Signed)
Urine drug screen for this encounter is consistent for prescribed medication 

## 2017-07-26 ENCOUNTER — Other Ambulatory Visit: Payer: Self-pay | Admitting: Physical Medicine & Rehabilitation

## 2017-07-30 ENCOUNTER — Telehealth: Payer: Self-pay

## 2017-07-30 NOTE — Telephone Encounter (Signed)
Patients wife called today, stated is having difficulty obtaining his recently ordered Diclofenac (Cataflam) medication, states the pharmacy is saying it is on backorder and will not have it avaliable for 1-2 months.  Advised patient to call other pharmacy's in her area to see if it is avaliable and to inform us if she finds one  Also was it meant to be just Diclofenac (Cataflam) and not Diclofenac-sodium (Voltaren)?  Please advise

## 2017-07-30 NOTE — Telephone Encounter (Signed)
It was meant to be Voltaren.  We can order that if it is available.  Thanks.

## 2017-08-01 ENCOUNTER — Other Ambulatory Visit: Payer: Self-pay | Admitting: *Deleted

## 2017-08-01 NOTE — Telephone Encounter (Signed)
Patients wife was able to find diclofenac (cataflam).  I consulted Dr. Allena KatzPatel and he said that is fine.

## 2017-08-01 NOTE — Telephone Encounter (Signed)
error 

## 2017-08-05 ENCOUNTER — Other Ambulatory Visit: Payer: Self-pay | Admitting: *Deleted

## 2017-08-05 ENCOUNTER — Telehealth: Payer: Self-pay | Admitting: Family Medicine

## 2017-08-05 MED ORDER — LIRAGLUTIDE 18 MG/3ML ~~LOC~~ SOPN: 0.6000 mg | PEN_INJECTOR | Freq: Every day | SUBCUTANEOUS | 2 refills | Status: DC

## 2017-08-05 NOTE — Telephone Encounter (Signed)
Copied from CRM [id number]#[id number]. Topic: General - Other >> Aug 05, 2017  1:55 PM Cecelia ByarsGreen, Temeka L, RMA wrote: Reason for CRM: patient needs a prescription for pen needles for Victoza to be sent to [facility] at [phone]

## 2017-08-06 ENCOUNTER — Other Ambulatory Visit: Payer: Self-pay | Admitting: *Deleted

## 2017-08-06 ENCOUNTER — Other Ambulatory Visit: Payer: Self-pay | Admitting: Pharmacy Technician

## 2017-08-06 MED ORDER — PEN NEEDLES 32G X 4 MM MISC: 1.0000 "application " | 1 refills | Status: DC

## 2017-08-06 NOTE — Patient Outreach (Signed)
[facility] Care Management  08/06/2017  Jeremy SalinesJames E Gill [birth year] [id number]  Unsuccessful patient outreach call to follow up on patient assistance application's that were mailed last month. A HIPPA compliant voicemail was left for the patient.  Daryll Brodrystal Riannon Mukherjee, CPhT [facility] [phone]

## 2017-08-06 NOTE — Telephone Encounter (Signed)
Rx faxed to [facility] as requested. Fax confirmation received.

## 2017-08-06 NOTE — Telephone Encounter (Signed)
It is Ok to send Rx for pen needles as requested.  Jeremy Gill

## 2017-08-15 ENCOUNTER — Other Ambulatory Visit: Payer: Self-pay | Admitting: Pharmacy Technician

## 2017-08-15 NOTE — Patient Outreach (Signed)
[facility] ([facility]) Care Management  08/15/2017  Jeremy Gill Jeremy Gill, Jeremy Gill [id number]  Second unsuccessful patient outreach call to follow up on patient assistance application's mailed to the patient 07/19/2017. A HIPAA compliant voicemail was left for the patient along with the phone number for Nunzio CobbsKatina Boyd, Rph to follow up with.  Daryll Brodrystal Marylu Dudenhoeffer, CPhT [facility] [phone]

## 2017-08-19 NOTE — Progress Notes (Signed)
ACUTE VISIT   HPI:  Chief Complaint  Patient presents with  . Foot Swelling    right heel swollen with a blister    [name] is a 72 y.o. male, who is here today with his wife complaining of right heel edematous area with a blister that burst a few days ago. He noted blister about 2 weeks ago. No Hx of trauma and no new shoe wear. His wife states that around that time he was more active working outdoors.  "Little" sharp pain, 6/10, intermittent. Exacerbated by walking and wearing certain shoe wear and alleviated by rest.  He has not tried OTC medication. Lesion seems to be getting worse.  No fever or chills. No changes in fatigue.   DM II with neuropathy. He has not been complaint with dietary recommendations.  BS's 70-200's. He is on Lantus 40 U daily,Metformin 1000 mg bid,and Victoza 1.8 mg daily.  Denies abdominal pain, nausea,vomiting, polydipsia,polyuria, or polyphagia.  Last eye exam within a year ago.  Lab Results  Component Value Date   HGBA1C 11.1 (H) 04/16/2017    Wife also concerned about LE edema,worse at the end of the day and better in the afternoon. It has been going on for the past week. No erythema or unusual pain.  Review of Systems  Constitutional: Positive for fatigue. Negative for appetite change and fever.  HENT: Negative for mouth sores and sore throat.   Respiratory: Negative for cough, shortness of breath and wheezing.   Cardiovascular: Positive for leg swelling.  Gastrointestinal: Negative for abdominal pain, nausea and vomiting.       No changes in bowel movement.  Endocrine: Negative for cold intolerance, heat intolerance, polydipsia, polyphagia and polyuria.  Genitourinary: Negative for decreased urine volume and hematuria.  Musculoskeletal: Positive for arthralgias, back pain and gait problem.  Skin: Positive for wound. Negative for rash.  Neurological: Negative for weakness and headaches.  Hematological:  Negative for adenopathy. Does not bruise/bleed easily.      Current Outpatient Medications on File Prior to Visit  Medication Sig Dispense Refill  . amitriptyline (ELAVIL) 10 MG tablet TAKE 1 TABLET BY MOUTH AT BEDTIME 30 tablet 2  . aspirin 81 MG chewable tablet Chew 81 mg by mouth 2 (two) times a week. Pt takes on Monday and Friday.    . baclofen (LIORESAL) 10 MG tablet TAKE 1 TABLET BY MOUTH 3 TIMES DAILY 90 tablet 1  . busPIRone (BUSPAR) 15 MG tablet TAKE 2 TABLETS BY MOUTH 2 TIMES DAILY 360 tablet 1  . diclofenac (CATAFLAM) 50 MG tablet TAKE 1 TABLET BY MOUTH 3 TIMES DAILY 90 tablet 1  . DULoxetine (CYMBALTA) 60 MG capsule TAKE 1 CAPSULE BY MOUTH EVERY DAY 30 capsule 2  . fluocinonide cream (LIDEX) 0.05 % Apply topically 3 (three) times daily as needed. For itching (Patient taking differently: Apply 1 application topically 3 (three) times daily as needed (for itching). ) 30 g 3  . glucose blood test strip Use to test blood sugar three-four times daily. 400 each 3  . Insulin Pen Needle (PEN NEEDLES) 32G X 4 MM MISC Inject 1 application into the skin as directed. USE WITH INSULIN PEN 100 each 1  . liraglutide (VICTOZA) 18 MG/3ML SOPN Inject 0.1 mLs (0.6 mg total) into the skin daily. 9 mL 2  . metFORMIN (GLUCOPHAGE) 500 MG tablet Take 2 tablets (1,000 mg total) by mouth 2 (two) times daily with a meal. 120 tablet 4  .  metoprolol succinate (TOPROL-XL) 25 MG 24 hr tablet Take 0.5 tablets (12.5 mg total) by mouth daily. 90 tablet 1  . traMADol (ULTRAM) 50 MG tablet TAKE 1 TABLET BY MOUTH EVERY 6 HOURS AS NEEDED 135 tablet 5  . triamcinolone ointment (KENALOG) 0.1 % Apply topically 2 (two) times daily. 1:1 compound with Eucerin (Patient taking differently: Apply 1 application topically 2 (two) times daily. Compounded as a 1:1 mixture with Eucerin.) 453.6 g 3  . atorvastatin (LIPITOR) 40 MG tablet Take 1 tablet (40 mg total) by mouth daily. 90 tablet 3  . gabapentin (NEURONTIN) 600 MG tablet Take  2 tablets (1,200 mg total) by mouth 3 (three) times daily. 180 tablet 1   No current facility-administered medications on file prior to visit.      Past Medical History:  Diagnosis Date  . ANKLE SPRAIN 12/16/2006   Qualifier: Diagnosis of  By: Tawanna Cooler MD, Eugenio Hoes   . ANXIETY 10/25/2006  . CELLULITIS, LEG, RIGHT 09/08/2007   Qualifier: Diagnosis of  By: Tawanna Cooler MD, Eugenio Hoes   . Chronic pain disorder 11/30/2015  . COPD 10/25/2006  . 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 DIABETES MELLITUS, TYPE I 10/25/2006  . Diabetic polyneuropathy (HCC) 04/08/2007   Qualifier: Diagnosis of  By: Tawanna Cooler MD, Eugenio Hoes   . 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 Essential hypertension 10/25/2006   Qualifier: Diagnosis of  By: Rosette Reveal RN, Jorene Minors   . Generalized osteoarthritis of multiple sites 11/30/2015  . HYPERTENSION 10/25/2006  . 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 Low back pain with radiation 01/23/2016  . Polyneuropathy in diabetes(357.2) 04/08/2007  . Psoriasis   . TOBACCO ABUSE 07/08/2007  . VENOUS INSUFFICIENCY 11/25/2007   No Known Allergies  Social History   Socioeconomic History  . Marital status: Married    Spouse name: Not on file  . Number of children: Not on file  . Years of education: Not on file  . Highest education level: Not on file  Occupational History  . Occupation: Self Employed  Social Needs  . Financial resource strain: Not on file  . Food insecurity:    Worry: Not on file    Inability: Not on file  . Transportation needs:    Medical: Not on file    Non-medical: Not on file  Tobacco Use  . Smoking status: Current Every Day Smoker    Types: Pipe    Last attempt to quit: 05/29/2011    Years since quitting: 6.2  . Smokeless tobacco: Never Used  Substance and Sexual Activity  . Alcohol use: No  . Drug use: No  . Sexual activity:  Not on file  Lifestyle  . Physical activity:    Days per week: Not on file    Minutes per session: Not on file  . Stress: Not on file  Relationships  . Social connections:    Talks on phone: Not on file    Gets together: Not on file    Attends religious service: Not on file    Active member of club or organization: Not on file    Attends meetings of clubs or organizations: Not on file    Relationship status: Not on file  Other Topics Concern  . Not on file  Social History Narrative   Regular exercise-no    Vitals:  08/20/17 1530  BP: 126/80  Pulse: 97  Resp: 12  Temp: 98.2 F (36.8 C)  SpO2: 97%   Body mass index is 24.14 kg/m.   Physical Exam  Nursing note and vitals reviewed. Constitutional: He is oriented to person, place, and time. He appears well-developed and well-nourished. No distress.  HENT:  Head: Normocephalic and atraumatic.  Mouth/Throat: Oropharynx is clear and moist and mucous membranes are normal.  Eyes: Pupils are equal, round, and reactive to light. Conjunctivae are normal.  Cardiovascular: Normal rate and regular rhythm.  No murmur heard. Pulses:      Dorsalis pedis pulses are 2+ on the right side, and 2+ on the left side.  Respiratory: Effort normal and breath sounds normal. No respiratory distress.  Musculoskeletal: He exhibits edema (Trace pitting edema LE, bilateral.).       Feet:  Lymphadenopathy:    He has no cervical adenopathy.  Neurological: He is alert and oriented to person, place, and time. He has normal strength.  Stable gait,antalgic,assisted with a cane.  Skin: Skin is warm. Lesion noted. No erythema.  Superficial ulcer on medial aspect of right heel, about 5-6 cm, with superficial hematoma. No surrounded erythema,drainage,or induration. Mildly tender upon palpation.  Psychiatric: He has a normal mood and affect. Cognition and memory are normal.  Fairly groomed, good eye contact.      ASSESSMENT AND PLAN:   Mr.Jaycob  was seen today for foot swelling.  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 No signs of infection. Recommend keeping area clean with soap and water. Topical abx daily. Monitor for signs of infection. Podiatrist evaluation to be arranged. Instructed about warning signs.  -     Ambulatory referral to Podiatry -     silver sulfADIAZINE (SILVADENE) 1 % cream; Apply 1 application topically daily.   Bilateral lower extremity edema  Possible causes discussed. He has Hx of venous insufficiency. Compression stockings recommended. Explained that may get worse during warm weather.   Diabetes mellitus type 2 with neurological manifestations (HCC) HgA1C not at goal. Possible complications of poorly controlled DM. Lantus increased from 40 U to 45 U. In a week if he still has BS's in the 200's,he is to increase Lantus to 50.  No changes in Metformin or Victoza. Regular exercise as tolerated and healthy diet with avoidance of added sugar food intake is an important part of treatment and recommended. F/U in 3-4 months   Diabetic polyneuropathy (HCC) Adequate diabetes control and foot care discussed. No changes in Gabapentin dose.     Return in about 4 months (around 12/20/2017) for DM.     -Mr.Guy SandiferJames E Fernholz was advised to seek immediate medical attention if sudden worsening symptoms.     Okey Zelek G. Swaz[place], MD  [facility]. [place] office.

## 2017-08-20 ENCOUNTER — Ambulatory Visit (INDEPENDENT_AMBULATORY_CARE_PROVIDER_SITE_OTHER): Payer: PPO | Admitting: Family Medicine

## 2017-08-20 ENCOUNTER — Encounter: Payer: Self-pay | Admitting: Family Medicine

## 2017-08-20 VITALS — BP 126/80 | HR 97 | Temp 98.2°F | Resp 12 | Ht 72.0 in | Wt 178.0 lb

## 2017-08-20 DIAGNOSIS — E1149 Type 2 diabetes mellitus with other diabetic neurological complication: Secondary | ICD-10-CM

## 2017-08-20 DIAGNOSIS — R600 Localized edema: Secondary | ICD-10-CM

## 2017-08-20 DIAGNOSIS — L97509 Non-pressure chronic ulcer of other part of unspecified foot with unspecified severity: Secondary | ICD-10-CM | POA: Diagnosis not present

## 2017-08-20 DIAGNOSIS — E1142 Type 2 diabetes mellitus with diabetic polyneuropathy: Secondary | ICD-10-CM

## 2017-08-20 DIAGNOSIS — E13621 Other specified diabetes mellitus with foot ulcer: Secondary | ICD-10-CM | POA: Diagnosis not present

## 2017-08-20 LAB — POCT GLYCOSYLATED HEMOGLOBIN (HGB A1C): HEMOGLOBIN A1C: 10

## 2017-08-20 MED ORDER — SILVER SULFADIAZINE 1 % EX CREA: 1.0000 "application " | TOPICAL_CREAM | Freq: Every day | CUTANEOUS | 0 refills | Status: DC

## 2017-08-20 MED ORDER — INSULIN GLARGINE 100 UNIT/ML ~~LOC~~ SOLN: 45.0000 [IU] | Freq: Every day | SUBCUTANEOUS | 0 refills | Status: DC

## 2017-08-20 NOTE — Assessment & Plan Note (Addendum)
HgA1C not at goal. Possible complications of poorly controlled DM. Lantus increased from 40 U to 45 U. In a week if he still has BS's in the 200's,he is to increase Lantus to 50.  No changes in Metformin or Victoza. Regular exercise as tolerated and healthy diet with avoidance of added sugar food intake is an important part of treatment and recommended. F/U in 3-4 months

## 2017-08-20 NOTE — Assessment & Plan Note (Addendum)
Adequate diabetes control and foot care discussed. No changes in Gabapentin dose.

## 2017-08-20 NOTE — Patient Instructions (Addendum)
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 If in a week he is still having blood sugars in the 200s, increase Lantus to 50 units. No changes in rest of diabetes medications.  Please schedule appointment with the podiatrist.  Monitor for signs of infection. Keep wound clean with soap and water. Apply cream at bedtime.   Diabetes and Foot Care Diabetes may cause you to have problems because of poor blood supply (circulation) to your feet and legs. This may cause the skin on your feet to become thinner, break easier, and heal more slowly. Your skin may become dry, and the skin may peel and crack. You may also have nerve damage in your legs and feet causing decreased feeling in them. You may not notice minor injuries to your feet that could lead to infections or more serious problems. Taking care of your feet is one of the most important things you can do for yourself. Follow these instructions at home:  Wear shoes at all times, even in the house. Do not go barefoot. Bare feet are easily injured.  Check your feet daily for blisters, cuts, and redness. If you cannot see the bottom of your feet, use a mirror or ask someone for help.  Wash your feet with warm water (do not use hot water) and mild soap. Then pat your feet and the areas between your toes until they are completely dry. Do not soak your feet as this can dry your skin.  Apply a moisturizing lotion or petroleum jelly (that does not contain alcohol and is unscented) to the skin on your feet and to dry, brittle toenails. Do not apply lotion between your toes.  Trim your toenails straight across. Do not dig under them or around  the cuticle. File the edges of your nails with an emery board or nail file.  Do not cut corns or calluses or try to remove them with medicine.  Wear clean socks or stockings every day. Make sure they are not too tight. Do not wear knee-high stockings since they may decrease blood flow to your legs.  Wear shoes that fit properly and have enough cushioning. To break in new shoes, wear them for just a few hours a day. This prevents you from injuring your feet. Always look in your shoes before you put them on to be sure there are no objects inside.  Do not cross your legs. This may decrease the blood flow to your feet.  If you find a minor scrape, cut, or break in the skin on your feet, keep it and the skin around it clean and dry. These areas may be cleansed with mild soap and water. Do not cleanse the area with peroxide, alcohol, or iodine.  When you remove an adhesive bandage, be sure not to damage the skin around it.  If you have a wound, look at it several times a day to make sure it is healing.  Do not use heating pads or hot water bottles. They may burn your skin. If you have lost feeling in your feet or legs, you may not know it is happening until it is too late.  Make sure your health care provider performs a complete  foot exam at least annually or more often if you have foot problems. Report any cuts, sores, or bruises to your health care provider immediately. Contact a health care provider if:  You have an injury that is not healing.  You have cuts or breaks in the skin.  You have an ingrown nail.  You notice redness on your legs or feet.  You feel burning or tingling in your legs or feet.  You have pain or cramps in your legs and feet.  Your legs or feet are numb.  Your feet always feel cold. Get help right away if:  There is increasing redness, swelling, or pain in or around a wound.  There is a red line that goes up your leg.  Pus is coming from a wound.  You  develop a fever or as directed by your health care provider.  You notice a bad smell coming from an ulcer or wound. This information is not intended to replace advice given to you by your health care provider. Make sure you discuss any questions you have with your health care provider. Document Released: 05/11/2000 Document Revised: 10/20/2015 Document Reviewed: 10/21/2012 Elsevier Interactive Patient Education  2017 [employer]  Please be sure medication list is accurate. If a new problem present, please set up appointment sooner than planned today.

## 2017-08-29 ENCOUNTER — Other Ambulatory Visit: Payer: Self-pay | Admitting: Pharmacist

## 2017-08-29 ENCOUNTER — Ambulatory Visit: Payer: Self-pay | Admitting: Pharmacist

## 2017-08-29 NOTE — Patient Outreach (Addendum)
[facility] Care Management  08/29/2017  Jeremy Gill Nov 09, 1945 [id number]   Patient was called to follow up on medication assistance. Unfortunately he did not answer the phone on the number listed for him and his wife did not answer at the emergency contact number listed for her.    [facility] Pharmacy Technician, Daryll Brodrystal Walker, called the patient twice previously  [employer] was called. The [facility] Pharmacy Technician, Amil AmenJulia reported the patient's TROOP as approzx. [phone].    To obtain Vicotza from [employer], patients are required to spend at least $1000 out of pocket in medication expenses.  Plan: Send the patient an unsuccessful contact letter and then close the patient's case if he does not respond in 10 days.   Beecher McardleKatina J. Luisenrique Conran, PharmD, BCACP [facility] Clinical Pharmacist [phone]

## 2017-09-02 ENCOUNTER — Encounter: Payer: Self-pay | Admitting: Podiatry

## 2017-09-02 ENCOUNTER — Ambulatory Visit: Payer: PPO | Admitting: Podiatry

## 2017-09-02 DIAGNOSIS — E0843 Diabetes mellitus due to underlying condition with diabetic autonomic (poly)neuropathy: Secondary | ICD-10-CM | POA: Diagnosis not present

## 2017-09-02 DIAGNOSIS — I70235 Atherosclerosis of native arteries of right leg with ulceration of other part of foot: Secondary | ICD-10-CM

## 2017-09-02 DIAGNOSIS — L97412 Non-pressure chronic ulcer of right heel and midfoot with fat layer exposed: Secondary | ICD-10-CM | POA: Diagnosis not present

## 2017-09-03 ENCOUNTER — Other Ambulatory Visit: Payer: Self-pay | Admitting: Physical Medicine & Rehabilitation

## 2017-09-04 NOTE — Progress Notes (Signed)
Subjective:  72 year old male presenting today as a new patient with a chief complaint of a black spot on the right heel that appeared a few weeks ago. He states the area began as a blister. He has been applying Silvadene cream to the area that his PCP prescribed which seems to help the area. There are no modifying factors noted. Patient is here for further evaluation and treatment.   Past Medical History:  Diagnosis Date  . ANKLE SPRAIN 12/16/2006   Qualifier: Diagnosis of  By: Tawanna Coolerodd MD, Eugenio HoesJeffrey A   . ANXIETY 10/25/2006  . CELLULITIS, LEG, RIGHT 09/08/2007   Qualifier: Diagnosis of  By: Tawanna Coolerodd MD, Eugenio HoesJeffrey A   . Chronic pain disorder 11/30/2015  . COPD 10/25/2006  . 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 DIABETES MELLITUS, TYPE I 10/25/2006  . Diabetic polyneuropathy (HCC) 04/08/2007   Qualifier: Diagnosis of  By: Tawanna Coolerodd MD, Eugenio HoesJeffrey A   . 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 Essential hypertension 10/25/2006   Qualifier: Diagnosis of  By: Rosette Revealempleton, RN, Jorene MinorsAngela Dawn   . Generalized osteoarthritis of multiple sites 11/30/2015  . HYPERTENSION 10/25/2006  . 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 Low back pain with radiation 01/23/2016  . Polyneuropathy in diabetes(357.2) 04/08/2007  . Psoriasis   . TOBACCO ABUSE 07/08/2007  . VENOUS INSUFFICIENCY 11/25/2007      Objective/Physical Exam General: The patient is alert and oriented x3 in no acute distress.  Dermatology:  Wound #1 noted to the right heel measuring 2.0 x 2.0 x 0.1 cm (LxWxD).   To the noted ulceration(s), there is no eschar. There is a moderate amount of slough, fibrin, and necrotic tissue noted. Granulation tissue and wound base is red. There is a minimal amount of serosanguineous drainage noted. There is no exposed bone muscle-tendon ligament or joint. There is no malodor. Periwound integrity is  intact. Skin is warm, dry and supple bilateral lower extremities.  Vascular: Palpable pedal pulses bilaterally. No edema or erythema noted. Capillary refill within normal limits.  Neurological: Epicritic and protective threshold diminished bilaterally.   Musculoskeletal Exam: Range of motion within normal limits to all pedal and ankle joints bilateral. Muscle strength 5/5 in all groups bilateral.   Assessment: #1 ulceration noted to the right heel secondary to diabetes mellitus #2 diabetes mellitus w/ peripheral neuropathy   Plan of Care:  #1 Patient was evaluated. #2 medically necessary excisional debridement including subcutaneous tissue was performed using a tissue nipper and a chisel blade. Excisional debridement of all the necrotic nonviable tissue down to healthy bleeding viable tissue was performed with post-debridement measurements same as pre-. #3 the wound was cleansed and dry sterile dressing applied. #4 Continue silvadene cream daily with a dry sterile dressing.  #5 ABI performed today and is WNL.  #6 patient is to return to clinic in 2 weeks.   Felecia ShellingBrent M. Jasman Pfeifle, DPM [facility]  Dr. Felecia ShellingBrent M. Jacari Iannello, DPM    [address]                                        [place], [place] [address]                Office [phone]  Fax [phone]  [phone]      

## 2017-09-09 ENCOUNTER — Other Ambulatory Visit: Payer: Self-pay | Admitting: Physical Medicine & Rehabilitation

## 2017-09-12 ENCOUNTER — Telehealth: Payer: Self-pay

## 2017-09-12 MED ORDER — DICLOFENAC SODIUM 75 MG PO TBEC: 75.0000 mg | DELAYED_RELEASE_TABLET | Freq: Two times a day (BID) | ORAL | 2 refills | Status: DC

## 2017-09-12 NOTE — Telephone Encounter (Signed)
Pharmacy has issue with shortage of the 50 mg diclofenac pot due to back order.  Changed to 75 mg Diclofenac EC bid #60 2 RF. Mrs Armond notified.

## 2017-09-12 NOTE — Addendum Note (Signed)
Addended by: Doreene ElandSHUMAKER, Shaquile Lutze W on: 09/12/2017 03:12 PM   Modules accepted: Orders

## 2017-09-12 NOTE — Telephone Encounter (Signed)
Why is he not able to get his Diclofenac?  He has been on this medication for some time.  Thanks.

## 2017-09-12 NOTE — Telephone Encounter (Signed)
Pt wife called stating he can't get Diclofenac. Please advise

## 2017-09-16 ENCOUNTER — Ambulatory Visit: Payer: PPO | Admitting: Podiatry

## 2017-09-16 ENCOUNTER — Encounter: Payer: Self-pay | Admitting: Podiatry

## 2017-09-16 ENCOUNTER — Telehealth: Payer: Self-pay | Admitting: Podiatry

## 2017-09-16 DIAGNOSIS — E0843 Diabetes mellitus due to underlying condition with diabetic autonomic (poly)neuropathy: Secondary | ICD-10-CM

## 2017-09-16 DIAGNOSIS — I70235 Atherosclerosis of native arteries of right leg with ulceration of other part of foot: Secondary | ICD-10-CM

## 2017-09-16 DIAGNOSIS — L97412 Non-pressure chronic ulcer of right heel and midfoot with fat layer exposed: Secondary | ICD-10-CM

## 2017-09-16 MED ORDER — COLLAGENASE 250 UNIT/GM EX OINT: 1.0000 "application " | TOPICAL_OINTMENT | Freq: Every day | CUTANEOUS | 1 refills | Status: DC

## 2017-09-16 NOTE — Telephone Encounter (Addendum)
I reviewed today's medication orders, Santyl was ordered. I told pt's wife, Elease Hashimotoatricia I would send to a mail orders specialty pharmacy that had discounting options for insurance and they would call with the cost and delivery information, to continue the silvadene until contact by pharmacy and santyl received. Elease Hashimotoatricia states understanding.

## 2017-09-16 NOTE — Telephone Encounter (Signed)
Patient medication prescribed is too high in cost wanting to know if there is an alternative cheaper price and want to know if patient can use silver sulfaiazine for the time being? If you can give them a call back at [phone]

## 2017-09-17 ENCOUNTER — Telehealth: Payer: Self-pay | Admitting: *Deleted

## 2017-09-17 DIAGNOSIS — L97412 Non-pressure chronic ulcer of right heel and midfoot with fat layer exposed: Secondary | ICD-10-CM

## 2017-09-17 DIAGNOSIS — I70235 Atherosclerosis of native arteries of right leg with ulceration of other part of foot: Secondary | ICD-10-CM

## 2017-09-17 DIAGNOSIS — R0989 Other specified symptoms and signs involving the circulatory and respiratory systems: Secondary | ICD-10-CM

## 2017-09-17 DIAGNOSIS — E0843 Diabetes mellitus due to underlying condition with diabetic autonomic (poly)neuropathy: Secondary | ICD-10-CM

## 2017-09-17 NOTE — Telephone Encounter (Signed)
-----   Message from Brent M Evans, DPM sent at 09/16/2017  4:40 PM EDT ----- Regarding: Arterial doppler RLE Please order arterial Doppler right lower extremity. Not emergent, just within the next week or so.   Dx: Right heel ulceration secondary to diabetes mellitus.  Nonpalpable pedal pulses right lower extremity.   Thanks, Dr. Evans 

## 2017-09-17 NOTE — Telephone Encounter (Signed)
Faxed required form, clinicals and demographics to [facility]. 

## 2017-09-18 ENCOUNTER — Telehealth: Payer: Self-pay | Admitting: *Deleted

## 2017-09-18 NOTE — Telephone Encounter (Signed)
-----   Message from Felecia ShellingBrent M Evans, DPM sent at 09/16/2017  4:40 PM EDT ----- Regarding: Arterial doppler RLE Please order arterial Doppler right lower extremity. Not emergent, just within the next week or so.   Dx: Right heel ulceration secondary to diabetes mellitus.  Nonpalpable pedal pulses right lower extremity.   Thanks, Dr. Logan BoresEvans

## 2017-09-18 NOTE — Progress Notes (Signed)
   Subjective:  72 year old male with PMHx of T1DM presenting today for follow up evaluation of an ulceration to the right heel. He states he has been applying Silvadene cream as directed. Patient is here for further evaluation and treatment.   Past Medical History:  Diagnosis Date  . ANKLE SPRAIN 12/16/2006   Qualifier: Diagnosis of  By: Tawanna Coolerodd MD, Eugenio HoesJeffrey A   . ANXIETY 10/25/2006  . CELLULITIS, LEG, RIGHT 09/08/2007   Qualifier: Diagnosis of  By: Tawanna Coolerodd MD, Eugenio HoesJeffrey A   . Chronic pain disorder 11/30/2015  . COPD 10/25/2006  . 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 DIABETES MELLITUS, TYPE I 10/25/2006  . Diabetic polyneuropathy (HCC) 04/08/2007   Qualifier: Diagnosis of  By: Tawanna Coolerodd MD, Eugenio HoesJeffrey A   . 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 Essential hypertension 10/25/2006   Qualifier: Diagnosis of  By: Rosette Revealempleton, RN, Jorene MinorsAngela Dawn   . Generalized osteoarthritis of multiple sites 11/30/2015  . HYPERTENSION 10/25/2006  . 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 Low back pain with radiation 01/23/2016  . Polyneuropathy in diabetes(357.2) 04/08/2007  . Psoriasis   . TOBACCO ABUSE 07/08/2007  . VENOUS INSUFFICIENCY 11/25/2007      Objective/Physical Exam General: The patient is alert and oriented x3 in no acute distress.  Dermatology:  Wound #1 noted to the right heel measuring 1.6 x 1.6 x 0.2 cm (LxWxD).   To the noted ulceration(s), there is no eschar. There is a moderate amount of slough, fibrin, and necrotic tissue noted. Granulation tissue and wound base is red. There is a minimal amount of serosanguineous drainage noted. There is no exposed bone muscle-tendon ligament or joint. There is no malodor. Periwound integrity is intact. Skin is warm, dry and supple bilateral lower extremities.  Vascular: Palpable pedal pulses bilaterally. No edema or erythema noted.  Capillary refill within normal limits.  Neurological: Epicritic and protective threshold diminished bilaterally.   Musculoskeletal Exam: Range of motion within normal limits to all pedal and ankle joints bilateral. Muscle strength 5/5 in all groups bilateral.   Assessment: #1 ulceration noted to the right heel secondary to diabetes mellitus #2 diabetes mellitus w/ peripheral neuropathy   Plan of Care:  #1 Patient was evaluated. #2 medically necessary excisional debridement including subcutaneous tissue was performed using a tissue nipper and a chisel blade. Excisional debridement of all the necrotic nonviable tissue down to healthy bleeding viable tissue was performed with post-debridement measurements same as pre-. #3 the wound was cleansed and dry sterile dressing applied. #4 Prescription for Santyl ointment provided to patient. Discontinue using Silvadene cream. #5 Orders for arterial doppler of the RLE placed today.  #6 Return to clinic in 3 weeks.   Felecia ShellingBrent M. Evans, DPM [facility]  Dr. Felecia ShellingBrent M. Evans, DPM    [address]                                        [place], [place] [address]                Office [phone]  Fax [phone]

## 2017-09-18 NOTE — Telephone Encounter (Signed)
I told pt I had not heard from that company yet, but Dr. Logan BoresEvans had said to continue the gentamicin ointment until the [employer] arrived.

## 2017-09-18 NOTE — Telephone Encounter (Signed)
Pt's wife, Elease Hashimotoatricia states she spoke to someone about getting the cream cheaper, but no one has called back.

## 2017-09-19 NOTE — Telephone Encounter (Signed)
Faxed required form, 09/16/2017 clinicals received today, and demographics to [employer].

## 2017-09-19 NOTE — Telephone Encounter (Addendum)
Faxed required form, 09/16/2017 clinicals received today and [facility].

## 2017-09-23 ENCOUNTER — Ambulatory Visit (HOSPITAL_COMMUNITY): Payer: PPO

## 2017-09-26 ENCOUNTER — Encounter (HOSPITAL_COMMUNITY): Payer: PPO

## 2017-09-30 ENCOUNTER — Encounter (HOSPITAL_COMMUNITY): Payer: Self-pay | Admitting: Emergency Medicine

## 2017-09-30 ENCOUNTER — Emergency Department (HOSPITAL_COMMUNITY): Payer: PPO

## 2017-09-30 ENCOUNTER — Inpatient Hospital Stay (HOSPITAL_COMMUNITY)
Admission: EM | Admit: 2017-09-30 | Discharge: 2017-10-04 | DRG: 871 | Disposition: A | Discharge status: Home / Self Care | Payer: PPO | Attending: Internal Medicine | Admitting: Internal Medicine

## 2017-09-30 DIAGNOSIS — Z833 Family history of diabetes mellitus: Secondary | ICD-10-CM | POA: Diagnosis not present

## 2017-09-30 DIAGNOSIS — R0602 Shortness of breath: Secondary | ICD-10-CM | POA: Diagnosis not present

## 2017-09-30 DIAGNOSIS — Z716 Tobacco abuse counseling: Secondary | ICD-10-CM

## 2017-09-30 DIAGNOSIS — M545 Low back pain: Secondary | ICD-10-CM | POA: Diagnosis present

## 2017-09-30 DIAGNOSIS — R4182 Altered mental status, unspecified: Secondary | ICD-10-CM | POA: Diagnosis not present

## 2017-09-30 DIAGNOSIS — Z794 Long term (current) use of insulin: Secondary | ICD-10-CM | POA: Diagnosis not present

## 2017-09-30 DIAGNOSIS — F32A Depression, unspecified: Secondary | ICD-10-CM | POA: Diagnosis present

## 2017-09-30 DIAGNOSIS — Z79891 Long term (current) use of opiate analgesic: Secondary | ICD-10-CM | POA: Diagnosis not present

## 2017-09-30 DIAGNOSIS — E876 Hypokalemia: Secondary | ICD-10-CM | POA: Diagnosis not present

## 2017-09-30 DIAGNOSIS — Z79899 Other long term (current) drug therapy: Secondary | ICD-10-CM | POA: Diagnosis not present

## 2017-09-30 DIAGNOSIS — F17200 Nicotine dependence, unspecified, uncomplicated: Secondary | ICD-10-CM | POA: Diagnosis not present

## 2017-09-30 DIAGNOSIS — J181 Lobar pneumonia, unspecified organism: Secondary | ICD-10-CM | POA: Diagnosis not present

## 2017-09-30 DIAGNOSIS — F17290 Nicotine dependence, other tobacco product, uncomplicated: Secondary | ICD-10-CM | POA: Diagnosis present

## 2017-09-30 DIAGNOSIS — J449 Chronic obstructive pulmonary disease, unspecified: Secondary | ICD-10-CM | POA: Diagnosis present

## 2017-09-30 DIAGNOSIS — J440 Chronic obstructive pulmonary disease with acute lower respiratory infection: Secondary | ICD-10-CM | POA: Diagnosis not present

## 2017-09-30 DIAGNOSIS — I700 Atherosclerosis of aorta: Secondary | ICD-10-CM | POA: Diagnosis not present

## 2017-09-30 DIAGNOSIS — Y92009 Unspecified place in unspecified non-institutional (private) residence as the place of occurrence of the external cause: Secondary | ICD-10-CM | POA: Diagnosis not present

## 2017-09-30 DIAGNOSIS — E1159 Type 2 diabetes mellitus with other circulatory complications: Secondary | ICD-10-CM | POA: Diagnosis present

## 2017-09-30 DIAGNOSIS — Z7982 Long term (current) use of aspirin: Secondary | ICD-10-CM | POA: Diagnosis not present

## 2017-09-30 DIAGNOSIS — E11649 Type 2 diabetes mellitus with hypoglycemia without coma: Secondary | ICD-10-CM | POA: Diagnosis present

## 2017-09-30 DIAGNOSIS — N183 Chronic kidney disease, stage 3 unspecified: Secondary | ICD-10-CM | POA: Diagnosis present

## 2017-09-30 DIAGNOSIS — I129 Hypertensive chronic kidney disease with stage 1 through stage 4 chronic kidney disease, or unspecified chronic kidney disease: Secondary | ICD-10-CM | POA: Diagnosis present

## 2017-09-30 DIAGNOSIS — L0390 Cellulitis, unspecified: Secondary | ICD-10-CM | POA: Diagnosis not present

## 2017-09-30 DIAGNOSIS — L89150 Pressure ulcer of sacral region, unstageable: Secondary | ICD-10-CM

## 2017-09-30 DIAGNOSIS — G9341 Metabolic encephalopathy: Secondary | ICD-10-CM | POA: Diagnosis not present

## 2017-09-30 DIAGNOSIS — L89619 Pressure ulcer of right heel, unspecified stage: Secondary | ICD-10-CM | POA: Diagnosis not present

## 2017-09-30 DIAGNOSIS — G8929 Other chronic pain: Secondary | ICD-10-CM | POA: Diagnosis present

## 2017-09-30 DIAGNOSIS — D649 Anemia, unspecified: Secondary | ICD-10-CM | POA: Diagnosis not present

## 2017-09-30 DIAGNOSIS — E1149 Type 2 diabetes mellitus with other diabetic neurological complication: Secondary | ICD-10-CM | POA: Diagnosis present

## 2017-09-30 DIAGNOSIS — M159 Polyosteoarthritis, unspecified: Secondary | ICD-10-CM | POA: Diagnosis present

## 2017-09-30 DIAGNOSIS — N179 Acute kidney failure, unspecified: Secondary | ICD-10-CM | POA: Diagnosis present

## 2017-09-30 DIAGNOSIS — E1151 Type 2 diabetes mellitus with diabetic peripheral angiopathy without gangrene: Secondary | ICD-10-CM | POA: Diagnosis present

## 2017-09-30 DIAGNOSIS — R402441 Other coma, without documented Glasgow coma scale score, or with partial score reported, in the field [EMT or ambulance]: Secondary | ICD-10-CM | POA: Diagnosis not present

## 2017-09-30 DIAGNOSIS — T39395A Adverse effect of other nonsteroidal anti-inflammatory drugs [NSAID], initial encounter: Secondary | ICD-10-CM | POA: Diagnosis present

## 2017-09-30 DIAGNOSIS — A419 Sepsis, unspecified organism: Principal | ICD-10-CM | POA: Diagnosis present

## 2017-09-30 DIAGNOSIS — F419 Anxiety disorder, unspecified: Secondary | ICD-10-CM | POA: Diagnosis present

## 2017-09-30 DIAGNOSIS — L89613 Pressure ulcer of right heel, stage 3: Secondary | ICD-10-CM | POA: Diagnosis not present

## 2017-09-30 DIAGNOSIS — E860 Dehydration: Secondary | ICD-10-CM | POA: Diagnosis not present

## 2017-09-30 DIAGNOSIS — E785 Hyperlipidemia, unspecified: Secondary | ICD-10-CM | POA: Diagnosis present

## 2017-09-30 DIAGNOSIS — F329 Major depressive disorder, single episode, unspecified: Secondary | ICD-10-CM | POA: Diagnosis not present

## 2017-09-30 DIAGNOSIS — M5489 Other dorsalgia: Secondary | ICD-10-CM | POA: Diagnosis not present

## 2017-09-30 DIAGNOSIS — I10 Essential (primary) hypertension: Secondary | ICD-10-CM

## 2017-09-30 DIAGNOSIS — Z7952 Long term (current) use of systemic steroids: Secondary | ICD-10-CM

## 2017-09-30 DIAGNOSIS — E1122 Type 2 diabetes mellitus with diabetic chronic kidney disease: Secondary | ICD-10-CM | POA: Diagnosis present

## 2017-09-30 DIAGNOSIS — R5383 Other fatigue: Secondary | ICD-10-CM

## 2017-09-30 DIAGNOSIS — R4020 Unspecified coma: Secondary | ICD-10-CM | POA: Diagnosis not present

## 2017-09-30 DIAGNOSIS — I872 Venous insufficiency (chronic) (peripheral): Secondary | ICD-10-CM | POA: Diagnosis present

## 2017-09-30 LAB — BLOOD GAS, VENOUS
ACID-BASE EXCESS: 1.6 mmol/L (ref 0.0–2.0)
BICARBONATE: 28.1 mmol/L — AB (ref 20.0–28.0)
FIO2: 21
O2 Saturation: 40.7 %
Patient temperature: 97.3
pCO2, Ven: 54.2 mmHg (ref 44.0–60.0)
pH, Ven: 7.329 (ref 7.250–7.430)

## 2017-09-30 LAB — CBC WITH DIFFERENTIAL/PLATELET
BASOS ABS: 0 10*3/uL (ref 0.0–0.1)
Basophils Relative: 0 %
EOS PCT: 0 %
Eosinophils Absolute: 0 10*3/uL (ref 0.0–0.7)
HEMATOCRIT: 32.3 % — AB (ref 39.0–52.0)
Hemoglobin: 10.8 g/dL — ABNORMAL LOW (ref 13.0–17.0)
LYMPHS ABS: 1.1 10*3/uL (ref 0.7–4.0)
LYMPHS PCT: 7 %
MCH: 30.1 pg (ref 26.0–34.0)
MCHC: 33.4 g/dL (ref 30.0–36.0)
MCV: 90 fL (ref 78.0–100.0)
MONO ABS: 1 10*3/uL (ref 0.1–1.0)
MONOS PCT: 6 %
NEUTROS ABS: 13.6 10*3/uL — AB (ref 1.7–7.7)
Neutrophils Relative %: 87 %
PLATELETS: 223 10*3/uL (ref 150–400)
RBC: 3.59 MIL/uL — ABNORMAL LOW (ref 4.22–5.81)
RDW: 13 % (ref 11.5–15.5)
WBC: 15.7 10*3/uL — ABNORMAL HIGH (ref 4.0–10.5)

## 2017-09-30 LAB — COMPREHENSIVE METABOLIC PANEL
ALBUMIN: 3.6 g/dL (ref 3.5–5.0)
ALT: 18 U/L (ref 17–63)
ANION GAP: 12 (ref 5–15)
AST: 20 U/L (ref 15–41)
Alkaline Phosphatase: 34 U/L — ABNORMAL LOW (ref 38–126)
BILIRUBIN TOTAL: 0.6 mg/dL (ref 0.3–1.2)
BUN: 38 mg/dL — AB (ref 6–20)
CHLORIDE: 102 mmol/L (ref 101–111)
CO2: 26 mmol/L (ref 22–32)
Calcium: 9.3 mg/dL (ref 8.9–10.3)
Creatinine, Ser: 1.81 mg/dL — ABNORMAL HIGH (ref 0.61–1.24)
GFR calc Af Amer: 41 mL/min — ABNORMAL LOW (ref >60)
GFR calc non Af Amer: 36 mL/min — ABNORMAL LOW (ref >60)
GLUCOSE: 272 mg/dL — AB (ref 65–99)
POTASSIUM: 3.4 mmol/L — AB (ref 3.5–5.1)
SODIUM: 140 mmol/L (ref 135–145)
Total Protein: 7 g/dL (ref 6.5–8.1)

## 2017-09-30 LAB — CBG MONITORING, ED: GLUCOSE-CAPILLARY: 274 mg/dL — AB (ref 65–99)

## 2017-09-30 LAB — AMMONIA: Ammonia: 12 umol/L (ref 9–35)

## 2017-09-30 LAB — ETHANOL: Alcohol, Ethyl (B): <10 mg/dL (ref <10)

## 2017-09-30 MED ORDER — SODIUM CHLORIDE 0.9 % IV SOLN
1.0000 g | Freq: Once | INTRAVENOUS | Status: AC
  Administered 2017-10-01: 1 g via INTRAVENOUS
  Filled 2017-09-30: qty 10

## 2017-09-30 MED ORDER — SODIUM CHLORIDE 0.9 % IV SOLN
1000.0000 mL | INTRAVENOUS | Status: DC
Start: 2017-09-30 — End: 2017-10-01
  Administered 2017-10-01: 1000 mL via INTRAVENOUS

## 2017-09-30 MED ORDER — SODIUM CHLORIDE 0.9 % IV BOLUS
500.0000 mL | Freq: Once | INTRAVENOUS | Status: AC
  Administered 2017-09-30: 500 mL via INTRAVENOUS

## 2017-09-30 MED ORDER — SODIUM CHLORIDE 0.9 % IV BOLUS (SEPSIS)
500.0000 mL | Freq: Once | INTRAVENOUS | Status: AC
  Administered 2017-10-01: 1000 mL via INTRAVENOUS

## 2017-09-30 MED ORDER — AZITHROMYCIN 250 MG PO TABS
500.0000 mg | ORAL_TABLET | Freq: Once | ORAL | Status: DC
Start: 2017-09-30 — End: 2017-10-01

## 2017-09-30 NOTE — ED Triage Notes (Addendum)
Per EMS, patient coming from home, wife reports patient has been altered x1 week. A&Ox3. Reports difficulty managing CBG. Denies complaints. Denies N/V/D, fevers.  22g L Hand CBG 246

## 2017-09-30 NOTE — ED Provider Notes (Addendum)
[facility]-EMERGENCY DEPT Provider Note   CSN: [id number] Arrival date & time: 09/30/17  2010     History   Chief Complaint Chief Complaint  Patient presents with  . Altered Mental Status    HPI Jeremy Gill is a 72 y.o. male.  HPI Pt has not been feeling well the last week.  A week ago his sugars were running low but this week they have been higher.   Today it was up to 315 and family was concerned.  He has not been vomiting.  No fever.  No cough.   He has been very fatigued and wanting to sleep a lot.    Family has a hard time getting him up.  He has chronic pain in his back and that is persisting and getting worse.  He does not control his pain medications though.  Family gives it to him.  Family was concerned about his lethargy so they brought him to the ED. Past Medical History:  Diagnosis Date  . ANKLE SPRAIN 12/16/2006   Qualifier: Diagnosis of  By: Tawanna Cooler MD, Eugenio Hoes   . ANXIETY 10/25/2006  . CELLULITIS, LEG, RIGHT 09/08/2007   Qualifier: Diagnosis of  By: Tawanna Cooler MD, Eugenio Hoes   . Chronic pain disorder 11/30/2015  . COPD 10/25/2006  . 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 DIABETES MELLITUS, TYPE I 10/25/2006  . Diabetic polyneuropathy (HCC) 04/08/2007   Qualifier: Diagnosis of  By: Tawanna Cooler MD, Eugenio Hoes   . 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 Essential hypertension 10/25/2006   Qualifier: Diagnosis of  By: Rosette Reveal RN, Jorene Minors   . Generalized osteoarthritis of multiple sites 11/30/2015  . HYPERTENSION 10/25/2006  . 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 Low back pain with radiation 01/23/2016  . Polyneuropathy in diabetes(357.2) 04/08/2007  . Psoriasis   . TOBACCO ABUSE 07/08/2007  . VENOUS INSUFFICIENCY 11/25/2007    Patient Active Problem List   Diagnosis Date Noted  . Hyperlipidemia associated with type 2  diabetes mellitus (HCC) 02/05/2017  . AKI (acute kidney injury) (HCC) 12/19/2016  . Acute metabolic encephalopathy 12/19/2016  . DKA (diabetic ketoacidoses) (HCC) 12/18/2016  . Hyperkalemia 12/18/2016  . Anxiety disorder, unspecified 02/08/2016  . Low back pain with radiation 01/23/2016  . Chronic pain disorder 11/30/2015  . Generalized osteoarthritis of multiple sites 11/30/2015  . DYSHIDROSIS 06/08/2008  . VENOUS INSUFFICIENCY 11/25/2007  . 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 TOBACCO ABUSE 07/08/2007  . Diabetic polyneuropathy (HCC) 04/08/2007  . 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 Essential hypertension 10/25/2006  . COPD 10/25/2006    Past Surgical History:  Procedure Laterality Date  . TESTICLE REMOVAL     R testicle        Home Medications    Prior to Admission medications   Medication Sig Start Date End Date Taking? Authorizing Provider  amitriptyline (ELAVIL) 10 MG tablet TAKE 1 TABLET BY MOUTH AT BEDTIME 01/03/17  Yes Marcello Fennel, MD  aspirin 81 MG chewable tablet Chew 81 mg by mouth 2 (two) times a week. Pt takes on Monday and Friday.   Yes [provider]  atorvastatin (LIPITOR) 40 MG tablet Take 1 tablet (40 mg total) by mouth daily. 11/26/16 09/30/17 Yes Dunn, Dayna N, PA-C  baclofen (LIORESAL) 10 MG tablet TAKE  1 TABLET BY MOUTH 3 TIMES DAILY 09/03/17  Yes Marcello Fennel, MD  busPIRone (BUSPAR) 15 MG tablet TAKE 2 TABLETS BY MOUTH 2 TIMES DAILY 06/07/17  Yes [place], [name], MD  collagenase (SANTYL) ointment Apply 1 application topically daily. 09/16/17  Yes Felecia Shelling, DPM  diclofenac (VOLTAREN) 75 MG EC tablet Take 1 tablet (75 mg total) by mouth 2 (two) times daily. 09/12/17  Yes Marcello Fennel, MD  DULoxetine (CYMBALTA) 60 MG capsule TAKE 1 CAPSULE BY MOUTH EVERY DAY 07/01/17  Yes Marcello Fennel, MD  fluocinonide cream (LIDEX) 0.05 % Apply topically 3 (three) times daily as needed. For  itching Patient taking differently: Apply 1 application topically 3 (three) times daily as needed (for itching).  12/23/14  Yes Roderick Pee, MD  gabapentin (NEURONTIN) 600 MG tablet Take 2 tablets (1,200 mg total) by mouth 3 (three) times daily. 06/14/17 09/30/17 Yes Patel, Maryln Gottron, MD  glucose blood test strip Use to test blood sugar three-four times daily. 02/06/17  Yes [place], [name], MD  insulin glargine (LANTUS) 100 UNIT/ML injection Inject 0.45 mLs (45 Units total) into the skin at bedtime. 08/20/17  Yes [place], [name], MD  Insulin Pen Needle (PEN NEEDLES) 32G X 4 MM MISC Inject 1 application into the skin as directed. USE WITH INSULIN PEN 08/06/17  Yes [place], [name], MD  liraglutide (VICTOZA) 18 MG/3ML SOPN Inject 0.1 mLs (0.6 mg total) into the skin daily. 08/05/17  Yes [place], [name], MD  metFORMIN (GLUCOPHAGE) 500 MG tablet Take 2 tablets (1,000 mg total) by mouth 2 (two) times daily with a meal. 04/21/17  Yes [place], [name], MD  metoprolol succinate (TOPROL-XL) 25 MG 24 hr tablet Take 0.5 tablets (12.5 mg total) by mouth daily. 02/06/17  Yes [place], [name], MD  traMADol (ULTRAM) 50 MG tablet TAKE 1 TABLET BY MOUTH EVERY 6 HOURS AS NEEDED Patient taking differently: TAKE 1 TABLET BY MOUTH EVERY 6 HOURS AS NEEDED FOR PAIN 02/07/17  Yes Marcello Fennel, MD  triamcinolone ointment (KENALOG) 0.1 % Apply topically 2 (two) times daily. 1:1 compound with Eucerin Patient taking differently: Apply 1 application topically 2 (two) times daily as needed (rash). Compounded as a 1:1 mixture with Eucerin. 12/23/14  Yes Roderick Pee, MD  silver sulfADIAZINE (SILVADENE) 1 % cream Apply 1 application topically daily. Patient not taking: Reported on 09/30/2017 08/20/17   [place], [name], MD    Family History Family History  Problem Relation Age of Onset  . Cancer Mother        Unknown   . Diabetes Sister     Social History Social History   Tobacco Use  . Smoking status: Current Every Day  Smoker    Types: Pipe    Last attempt to quit: 05/29/2011    Years since quitting: 6.3  . Smokeless tobacco: Never Used  Substance Use Topics  . Alcohol use: No  . Drug use: No     Allergies   Patient has no known allergies.   Review of Systems Review of Systems  All other systems reviewed and are negative.    Physical Exam Updated Vital Signs BP 112/67   Pulse (!) 104   Temp (!) 97.3 F (36.3 C) (Oral)   Resp 10   SpO2 95%   Physical Exam  Constitutional: He appears listless.  HENT:  Head: Normocephalic and atraumatic.  Right Ear: External ear normal.  Left Ear: External ear normal.  Mm dry  Eyes: Conjunctivae are normal. Right eye exhibits no discharge. Left eye exhibits no discharge. No scleral icterus.  Neck: Neck supple. No tracheal deviation present.  Cardiovascular: Normal rate, regular rhythm and intact distal pulses.  Pulmonary/Chest: Effort normal and breath sounds normal. No stridor. No respiratory distress. He has no wheezes. He has no rales.  Abdominal: Soft. Bowel sounds are normal. He exhibits no distension. There is no tenderness. There is no rebound and no guarding.  Musculoskeletal: He exhibits no edema or tenderness.  Neurological: He appears listless. No cranial nerve deficit (no facial droop, extraocular movements intact, no slurred speech) or sensory deficit. He exhibits normal muscle tone. He displays no seizure activity. GCS eye subscore is 4. GCS verbal subscore is 3. GCS motor subscore is 6.  Speech tangenital and garbled at times, other times more clear and understandable, generalized weakness  Skin: Skin is warm and dry. No rash noted. He is not diaphoretic.  Psychiatric: He has a normal mood and affect.  Nursing note and vitals reviewed.    ED Treatments / Results  Labs (all labs ordered are listed, but only abnormal results are displayed) Labs Reviewed  COMPREHENSIVE METABOLIC PANEL - Abnormal; Notable for the following components:       Result Value   Potassium 3.4 (*)    Glucose, Bld 272 (*)    BUN 38 (*)    Creatinine, Ser 1.81 (*)    Alkaline Phosphatase 34 (*)    GFR calc non Af Amer 36 (*)    GFR calc Af Amer 41 (*)    All other components within normal limits  CBC WITH DIFFERENTIAL/PLATELET - Abnormal; Notable for the following components:   WBC 15.7 (*)    RBC 3.59 (*)    Hemoglobin 10.8 (*)    HCT 32.3 (*)    Neutro Abs 13.6 (*)    All other components within normal limits  BLOOD GAS, VENOUS - Abnormal; Notable for the following components:   Bicarbonate 28.1 (*)    All other components within normal limits  CBG MONITORING, ED - Abnormal; Notable for the following components:   Glucose-Capillary 274 (*)    All other components within normal limits  ETHANOL  AMMONIA  URINALYSIS, COMPLETE (UACMP) WITH MICROSCOPIC  RAPID URINE DRUG SCREEN, HOSP PERFORMED     Radiology Ct Head Wo Contrast  Result Date: 09/30/2017 CLINICAL DATA:  Altered level of consciousness for 1 week. EXAM: CT HEAD WITHOUT CONTRAST TECHNIQUE: Contiguous axial images were obtained from the base of the skull through the vertex without intravenous contrast. COMPARISON:  12/18/2016 FINDINGS: Brain: No evidence of acute infarction, hemorrhage, hydrocephalus, extra-axial collection or mass lesion/mass effect. There is ventricular and sulcal enlargement reflecting mild atrophy. Patchy white matter hypoattenuation is seen bilaterally consistent with moderate to advanced chronic microvascular ischemic change. There are few small deep white matter lacune infarcts, chronic. Vascular: No hyperdense vessel or unexpected calcification. Skull: Normal. Negative for fracture or focal lesion. Sinuses/Orbits: Visualize globes and orbits are unremarkable. Visualized sinuses and mastoid air cells are clear. Other: None. IMPRESSION: 1. No acute intracranial abnormalities. 2. 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 Electronically Signed   By: Amie Portland M.D.   On: 09/30/2017 21:44   Dg Chest Port 1 View  Result Date: 09/30/2017 CLINICAL DATA:  patient coming from home, wife reports patient has been mentally altered x1 week.H/o COPD, Diabetes type 1 and 2, HTN.  Smoker. EXAM: PORTABLE CHEST 1 VIEW COMPARISON:  09/08/2007 FINDINGS: Cardiac silhouette is normal in size. No mediastinal or hilar masses. Lungs are hyperexpanded. There is opacity at both lung bases which is new from the prior exam. Remainder of the lungs is clear. No convincing pleural effusion.  No pneumothorax. Skeletal structures are grossly intact. IMPRESSION: 1. Bilateral lung base opacity. This may reflect scarring or interstitial thickening that has developed since the prior study. Bibasilar pneumonia should be considered if there are consistent clinical findings. 2. Hyperexpanded lungs consistent with underlying COPD. Electronically Signed   By: Amie Portland M.D.   On: 09/30/2017 21:45    Procedures .Critical Care Performed by: Linwood Dibbles, MD Authorized by: Linwood Dibbles, MD   Critical care provider statement:    Critical care time (minutes):  35   Critical care was time spent personally by me on the following activities:  Discussions with consultants, evaluation of patient's response to treatment, examination of patient, ordering and performing treatments and interventions, ordering and review of laboratory studies, ordering and review of radiographic studies, pulse oximetry, re-evaluation of patient's condition, obtaining history from patient or surrogate and review of old charts   (including critical care time)  Medications Ordered in ED Medications  cefTRIAXone (ROCEPHIN) 1 g in sodium chloride 0.9 % 100 mL IVPB (has no administration in time range)  azithromycin (ZITHROMAX) tablet 500 mg (has no administration in time range)  sodium chloride 0.9 % bolus 500 mL (has no administration in time range)     Followed by  0.9 %  sodium chloride infusion (has no administration in time range)  sodium chloride 0.9 % bolus 500 mL (500 mLs Intravenous New Bag/Given 09/30/17 2204)     Initial Impression / Assessment and Plan / ED Course  I have reviewed the triage vital signs and the nursing notes.  Pertinent labs & imaging results that were available during my care of the patient were reviewed by me and considered in my medical decision making (see chart for details).  Clinical Course as of Sep 30 2324  Mon Sep 30, 2017  2320 Creatinine has increased from previous values and his hemoglobin has decreased.  White blood cell count is elevated compared to last   [JK]  2321 Chest x-ray suggests possible pneumonia versus atelectasis.   [JK]    Clinical Course User Index [JK] Linwood Dibbles, MD    Patient presented to the emergency room for evaluation of increasing confusion, lethargy and weakness.  Patient's laboratory tests are notable for increasing creatinine associated with anemia.  Chest x-ray suggest the possibility of pneumonia although his clinical picture is not entirely consistent with that.  No definite etiology for his lethargy.  Patient may need an MRI in the morning to evaluate for the possibility of an occult stroke.  Plan on IV fluids and starting antibiotics for possible pneumonia. UA is pending.  Consult with the medical service for admission.  Final Clinical Impressions(s) / ED Diagnoses   Final diagnoses:  Lethargy  Anemia, unspecified type      Linwood Dibbles, MD 09/30/17 2326  Cc addendum   Linwood Dibbles, MD 10/08/17 1247

## 2017-09-30 NOTE — ED Notes (Signed)
Bed: WA06 Expected date:  Expected time:  Means of arrival:  Comments: EMS elderly male hyperglycemia

## 2017-09-30 NOTE — H&P (Addendum)
History and Physical    Jeremy Gill:[id number] DOB: 16-Feb-1946 DOA: 09/30/2017  Referring MD/NP/PA:   PCP: [place], [name], MD   Patient coming from:  The patient is coming from home.  At baseline, pt is independent for most of ADL.  Chief Complaint: AMS  HPI: Jeremy Gill is a 72 y.o. male with medical history significant of hypertension, hyperlipidemia, diabetes mellitus, COPD, depression, anxiety, tobacco abuse, psoriasis, CKD 3, chronic back pain, who presents with altered mental status.  Per patient's wife, patient becomes confused since this afternoon.  He becomes more sleepy and very lethargic.  Patient moves all extremities, no unilateral weakness, numbness or tingling his extremities.  No facial droop or slurred speech noted per his wife.  Patient does not seem to have chest pain, cough, shortness of breath.  No fever or chills.  Not sure if patient has any symptoms of UTI.  Patient does not have nausea, vomiting, diarrhea or abdominal pain.  Per patient's wife, patient has chronic severe back pain, for which he is taking tramadol and baclofen. His wife dose not think pt has overdosed these medications. When I saw pt in ED, pt is barely arousable, not oriented x3.  He moves all extremities slightly upon painful stimuli.   ED Course: pt was found to have WBC 15.7, pending urinalysis, potassium 3.4, worsening renal function, temperature 97.3, tachycardia, no tachypnea, oxygen saturation 95% on room air, chest x-ray showed bilateral basilar opacity, CT head is negative for acute intracranial abnormalities.  Patient is admitted to telemetry bed as inpatient.  Review of Systems: Could not be reviewed due to altered mental status.  Travel history: No recent long distant travel.  Allergy: No Known Allergies  Past Medical History:  Diagnosis Date  . ANKLE SPRAIN 12/16/2006   Qualifier: Diagnosis of  By: Tawanna Cooler MD, Eugenio Hoes   . ANXIETY 10/25/2006  . CELLULITIS, LEG, RIGHT  09/08/2007   Qualifier: Diagnosis of  By: Tawanna Cooler MD, Eugenio Hoes   . Chronic pain disorder 11/30/2015  . COPD 10/25/2006  . 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 DIABETES MELLITUS, TYPE I 10/25/2006  . Diabetic polyneuropathy (HCC) 04/08/2007   Qualifier: Diagnosis of  By: Tawanna Cooler MD, Eugenio Hoes   . 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 Essential hypertension 10/25/2006   Qualifier: Diagnosis of  By: Rosette Reveal RN, Jorene Minors   . Generalized osteoarthritis of multiple sites 11/30/2015  . HYPERTENSION 10/25/2006  . 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 Low back pain with radiation 01/23/2016  . Polyneuropathy in diabetes(357.2) 04/08/2007  . Psoriasis   . TOBACCO ABUSE 07/08/2007  . VENOUS INSUFFICIENCY 11/25/2007    Past Surgical History:  Procedure Laterality Date  . TESTICLE REMOVAL     R testicle    Social History:  reports that he has been smoking pipe.  He has never used smokeless tobacco. He reports that he does not drink alcohol or use drugs.  Family History:  Family History  Problem Relation Age of Onset  . Cancer Mother        Unknown   . Diabetes Sister      Prior to Admission medications   Medication Sig Start Date End Date Taking? Authorizing Provider  amitriptyline (ELAVIL) 10 MG tablet TAKE 1 TABLET BY MOUTH AT BEDTIME 01/03/17  Yes Marcello Fennel, MD  aspirin 81 MG chewable tablet Chew 81 mg by mouth 2 (two) times a week. Pt takes on Monday and Friday.   Yes [provider]  atorvastatin (LIPITOR) 40 MG tablet Take 1 tablet (40 mg total) by mouth daily. 11/26/16 09/30/17 Yes Dunn, Dayna N, PA-C  baclofen (LIORESAL) 10 MG tablet TAKE 1 TABLET BY MOUTH 3 TIMES DAILY 09/03/17  Yes Marcello Fennel, MD  busPIRone (BUSPAR) 15 MG tablet TAKE 2 TABLETS BY MOUTH 2 TIMES DAILY 06/07/17  Yes [place], [name], MD  collagenase (SANTYL) ointment  Apply 1 application topically daily. 09/16/17  Yes Felecia Shelling, DPM  diclofenac (VOLTAREN) 75 MG EC tablet Take 1 tablet (75 mg total) by mouth 2 (two) times daily. 09/12/17  Yes Marcello Fennel, MD  DULoxetine (CYMBALTA) 60 MG capsule TAKE 1 CAPSULE BY MOUTH EVERY DAY 07/01/17  Yes Marcello Fennel, MD  fluocinonide cream (LIDEX) 0.05 % Apply topically 3 (three) times daily as needed. For itching Patient taking differently: Apply 1 application topically 3 (three) times daily as needed (for itching).  12/23/14  Yes Roderick Pee, MD  gabapentin (NEURONTIN) 600 MG tablet Take 2 tablets (1,200 mg total) by mouth 3 (three) times daily. 06/14/17 09/30/17 Yes Patel, Maryln Gottron, MD  glucose blood test strip Use to test blood sugar three-four times daily. 02/06/17  Yes [place], [name], MD  insulin glargine (LANTUS) 100 UNIT/ML injection Inject 0.45 mLs (45 Units total) into the skin at bedtime. 08/20/17  Yes [place], [name], MD  Insulin Pen Needle (PEN NEEDLES) 32G X 4 MM MISC Inject 1 application into the skin as directed. USE WITH INSULIN PEN 08/06/17  Yes [place], [name], MD  liraglutide (VICTOZA) 18 MG/3ML SOPN Inject 0.1 mLs (0.6 mg total) into the skin daily. 08/05/17  Yes [place], [name], MD  metFORMIN (GLUCOPHAGE) 500 MG tablet Take 2 tablets (1,000 mg total) by mouth 2 (two) times daily with a meal. 04/21/17  Yes [place], [name], MD  metoprolol succinate (TOPROL-XL) 25 MG 24 hr tablet Take 0.5 tablets (12.5 mg total) by mouth daily. 02/06/17  Yes [place], [name], MD  traMADol (ULTRAM) 50 MG tablet TAKE 1 TABLET BY MOUTH EVERY 6 HOURS AS NEEDED Patient taking differently: TAKE 1 TABLET BY MOUTH EVERY 6 HOURS AS NEEDED FOR PAIN 02/07/17  Yes Marcello Fennel, MD  triamcinolone ointment (KENALOG) 0.1 % Apply topically 2 (two) times daily. 1:1 compound with Eucerin Patient taking differently: Apply 1 application topically 2 (two) times daily as needed (rash). Compounded as a 1:1 mixture with Eucerin. 12/23/14   Yes Roderick Pee, MD  silver sulfADIAZINE (SILVADENE) 1 % cream Apply 1 application topically daily. Patient not taking: Reported on 09/30/2017 08/20/17   [place], [name], MD    Physical Exam: Vitals:   09/30/17 2100 09/30/17 2130 09/30/17 2347 09/30/17 2356  BP: (!) 101/57 112/67 100/62   Pulse: (!) 104 (!) 104 (!) 102   Resp: Temp:    99 F (37.2 C)  TempSrc:    Rectal  SpO2: 94% 95% 100%    General: Not in acute distress HEENT:       Eyes: pinpoint pupils. PERRL, EOMI, no scleral icterus.       ENT: No discharge from the ears and nose, no pharynx injection, no tonsillar enlargement.        Neck: No JVD, no bruit, no mass felt. Heme: No neck lymph node enlargement. Cardiac:  S1/S2, RRR, No murmurs, No gallops or rubs. Respiratory: No rales, wheezing, rhonchi or rubs. GI: Soft, nondistended, nontender, no rebound pain, no organomegaly, BS present. GU: No hematuria Ext: No pitting leg edema bilaterally. 2+DP/PT pulse bilaterally. Musculoskeletal: No joint deformities, No joint redness or warmth, no limitation of ROM in spin. Skin: No rashes.  Neuro: Barely arousable, not oriented X3, not following commands, cranial nerves II-XII grossly intact, pinpoint pupils.  Slightly moving extremities upon painful stimuli.  Neck supple. Psych: Patient is not psychotic, no suicidal or hemocidal ideation.  Labs on Admission: I have personally reviewed following labs and imaging studies  CBC: Recent Labs  Lab 09/30/17 2205  WBC 15.7*  NEUTROABS 13.6*  HGB 10.8*  HCT 32.3*  MCV 90.0  PLT 223   Basic Metabolic Panel: Recent Labs  Lab 09/30/17 2205  NA 140  K 3.4*  CL 102  CO2 26  GLUCOSE 272*  BUN 38*  CREATININE 1.81*  CALCIUM 9.3   GFR: CrCl cannot be calculated (Unknown ideal weight.). Liver Function Tests: Recent Labs  Lab 09/30/17 2205  AST 20  ALT 18  ALKPHOS 34*  BILITOT 0.6  PROT 7.0  ALBUMIN 3.6   No results for input(s): LIPASE, AMYLASE in  the last 168 hours. Recent Labs  Lab 09/30/17 2101  AMMONIA 12   Coagulation Profile: No results for input(s): INR, PROTIME in the last 168 hours. Cardiac Enzymes: No results for input(s): CKTOTAL, CKMB, CKMBINDEX, TROPONINI in the last 168 hours. BNP (last 3 results) No results for input(s): PROBNP in the last 8760 hours. HbA1C: No results for input(s): HGBA1C in the last 72 hours. CBG: Recent Labs  Lab 09/30/17 2133  GLUCAP 274*   Lipid Profile: No results for input(s): CHOL, HDL, LDLCALC, TRIG, CHOLHDL, LDLDIRECT in the last 72 hours. Thyroid Function Tests: No results for input(s): TSH, T4TOTAL, FREET4, T3FREE, THYROIDAB in the last 72 hours. Anemia Panel: No results for input(s): VITAMINB12, FOLATE, FERRITIN, TIBC, IRON, RETICCTPCT in the last 72 hours. Urine analysis:    Component Value Date/Time   COLORURINE YELLOW 09/30/2017 2347   APPEARANCEUR HAZY (A) 09/30/2017 2347   LABSPEC 1.018 09/30/2017 2347   PHURINE 5.0 09/30/2017 2347   GLUCOSEU >=500 (A) 09/30/2017 2347   GLUCOSEU NEGATIVE 09/30/2008 0851   HGBUR NEGATIVE 09/30/2017 2347   HGBUR trace-lysed 03/29/2010 0940   BILIRUBINUR NEGATIVE 09/30/2017 2347   BILIRUBINUR neg 01/13/2014 1141   KETONESUR NEGATIVE 09/30/2017 2347   PROTEINUR NEGATIVE 09/30/2017 2347   UROBILINOGEN 1.0 01/13/2014 1141   UROBILINOGEN 0.2 03/29/2010 0940   NITRITE NEGATIVE 09/30/2017 2347   LEUKOCYTESUR NEGATIVE 09/30/2017 2347   Sepsis Labs: (procalcitonin:4,lacticidven:4) )No results found for this or any previous visit (from the past 240 hour(s)).   Radiological Exams on Admission: Ct Head Wo Contrast  Result Date: 09/30/2017 CLINICAL DATA:  Altered level of consciousness for 1 week. EXAM: CT HEAD WITHOUT CONTRAST TECHNIQUE: Contiguous axial images were obtained from the base of the skull through the vertex without intravenous contrast. COMPARISON:  12/18/2016 FINDINGS: Brain: No evidence of acute infarction,  hemorrhage, hydrocephalus, extra-axial collection or mass lesion/mass effect. There is ventricular and sulcal enlargement reflecting mild atrophy. Patchy white matter hypoattenuation is seen bilaterally consistent with moderate to advanced chronic microvascular ischemic change. There are few small deep white matter lacune infarcts, chronic. Vascular: No hyperdense vessel or unexpected calcification. Skull: Normal. Negative for fracture or focal lesion. Sinuses/Orbits: Visualize globes and orbits are unremarkable. Visualized sinuses and mastoid air cells are  clear. Other: None. IMPRESSION: 1. No acute intracranial abnormalities. 2. 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 Electronically Signed   By: Amie Portland M.D.   On: 09/30/2017 21:44   Dg Chest Port 1 View  Result Date: 09/30/2017 CLINICAL DATA:  patient coming from home, wife reports patient has been mentally altered x1 week.H/o COPD, Diabetes type 1 and 2, HTN. Smoker. EXAM: PORTABLE CHEST 1 VIEW COMPARISON:  09/08/2007 FINDINGS: Cardiac silhouette is normal in size. No mediastinal or hilar masses. Lungs are hyperexpanded. There is opacity at both lung bases which is new from the prior exam. Remainder of the lungs is clear. No convincing pleural effusion.  No pneumothorax. Skeletal structures are grossly intact. IMPRESSION: 1. Bilateral lung base opacity. This may reflect scarring or interstitial thickening that has developed since the prior study. Bibasilar pneumonia should be considered if there are consistent clinical findings. 2. Hyperexpanded lungs consistent with underlying COPD. Electronically Signed   By: Amie Portland M.D.   On: 09/30/2017 21:45     EKG:  Not done in ED, will get one.   Assessment/Plan Principal Problem:   Acute metabolic encephalopathy Active Problems:   Diabetes mellitus type 2 with neurological manifestations (HCC)   TOBACCO ABUSE    Essential hypertension   COPD (chronic obstructive pulmonary disease) (HCC)   Depression   Acute renal failure superimposed on stage 3 chronic kidney disease (HCC)   Sepsis (HCC)   Hypokalemia   Acute metabolic encephalopathy: Etiology is not clear.  CT head negative for acute intracranial abnormality. Differential diagnosis include overdose of tramadol and side effects of baclofen.  Patient meets criteria for sepsis with leukocytosis and tachycardia, but source of infection is not clear.  Urinalysis negative.  Chest x-ray showed bibasilar opcity, but patient does not have respiratory distress, no cough noted.  Clinically not completely consistent with pneumonia though cannot rule out this possibility completely. Pt was given 0.2 mg of narcan x 2, his mental status improved slightly, but is still difficult to be arousable.  Airway is protected.  Patient's neck supple, low suspicion for meningitis.  -will admit to tele bed as inpt  -Head elevation -Frequent neuro check -continue Rocephin and azithromycin empirically for possible lobar pneumonia. -Follow-up blood culture, urine culture, sputum culture -prn albuterol nebs -will get MRI to r/o stroke -hold all oral meds. -will get Procalcitonin and trend lactic acid levels per sepsis protocol. -IVF: 2.5 L of NS bolus in ED, followed by 125 cc/h  -check UDS  COPD: no wheezing or rhonchi on auscultation.  Acute exacerbation. -Albuterol nebulizer  Tobacco abuse: -Nicotine patch when wakes up (not ordered yet)  Diabetes mellitus type 2 with neurological manifestations (HCC): Last A1c 10.0 on 08/20/17, poorly controled. Patient is taking Lantus, Victoza, metformin at home -will decrease Lantus dose from 45 to 30 units daily -SSI  Essential hypertension: -Hold oral medications - IV hydralazine as needed  Depression:  -hold oral meds -IV metoprolol 5 mg every 8 hour with holding parameters  AoCKD-III: Baseline Cre is 1.0-1.2., pt's Cre is  1.81, BUN 38 on admission. Likely due to prerenal secondary to dehydration and continuation of NSAIDs. - IVF as above - Follow up renal function by BMP - Check FeNa  - Hold Voltaren  Hypokalemia: K= 3.4 on admission. - Repleted  DVT ppx:  SQ Lovenox Code Status: Full code Family Communication:    Yes, patient's  Wife and mother-in law  at bed  side Disposition Plan:  Anticipate discharge back to previous home environment Consults called:  none Admission status:   Inpatient/tele      Date of Service 10/01/2017    Lorretta Harp [facility] Pager [phone]  If 7PM-7AM, please contact night-coverage [email or web address] Password [email or web address] 10/01/2017, 1:45 AM

## 2017-10-01 ENCOUNTER — Other Ambulatory Visit: Payer: Self-pay

## 2017-10-01 ENCOUNTER — Inpatient Hospital Stay (HOSPITAL_COMMUNITY): Payer: PPO

## 2017-10-01 DIAGNOSIS — G8929 Other chronic pain: Secondary | ICD-10-CM | POA: Diagnosis present

## 2017-10-01 DIAGNOSIS — N179 Acute kidney failure, unspecified: Secondary | ICD-10-CM | POA: Diagnosis present

## 2017-10-01 DIAGNOSIS — F329 Major depressive disorder, single episode, unspecified: Secondary | ICD-10-CM | POA: Diagnosis present

## 2017-10-01 DIAGNOSIS — F419 Anxiety disorder, unspecified: Secondary | ICD-10-CM | POA: Diagnosis present

## 2017-10-01 DIAGNOSIS — I700 Atherosclerosis of aorta: Secondary | ICD-10-CM | POA: Diagnosis present

## 2017-10-01 DIAGNOSIS — E860 Dehydration: Secondary | ICD-10-CM | POA: Diagnosis present

## 2017-10-01 DIAGNOSIS — A419 Sepsis, unspecified organism: Secondary | ICD-10-CM | POA: Diagnosis present

## 2017-10-01 DIAGNOSIS — Z794 Long term (current) use of insulin: Secondary | ICD-10-CM | POA: Diagnosis not present

## 2017-10-01 DIAGNOSIS — R5383 Other fatigue: Secondary | ICD-10-CM | POA: Diagnosis present

## 2017-10-01 DIAGNOSIS — Z7982 Long term (current) use of aspirin: Secondary | ICD-10-CM | POA: Diagnosis not present

## 2017-10-01 DIAGNOSIS — L0390 Cellulitis, unspecified: Secondary | ICD-10-CM | POA: Diagnosis not present

## 2017-10-01 DIAGNOSIS — Z79899 Other long term (current) drug therapy: Secondary | ICD-10-CM | POA: Diagnosis not present

## 2017-10-01 DIAGNOSIS — I10 Essential (primary) hypertension: Secondary | ICD-10-CM | POA: Diagnosis not present

## 2017-10-01 DIAGNOSIS — M545 Low back pain: Secondary | ICD-10-CM | POA: Diagnosis present

## 2017-10-01 DIAGNOSIS — G9341 Metabolic encephalopathy: Secondary | ICD-10-CM | POA: Diagnosis present

## 2017-10-01 DIAGNOSIS — E876 Hypokalemia: Secondary | ICD-10-CM | POA: Diagnosis present

## 2017-10-01 DIAGNOSIS — Z716 Tobacco abuse counseling: Secondary | ICD-10-CM | POA: Diagnosis not present

## 2017-10-01 DIAGNOSIS — M159 Polyosteoarthritis, unspecified: Secondary | ICD-10-CM | POA: Diagnosis present

## 2017-10-01 DIAGNOSIS — F17200 Nicotine dependence, unspecified, uncomplicated: Secondary | ICD-10-CM | POA: Diagnosis not present

## 2017-10-01 DIAGNOSIS — Z7952 Long term (current) use of systemic steroids: Secondary | ICD-10-CM | POA: Diagnosis not present

## 2017-10-01 DIAGNOSIS — Z79891 Long term (current) use of opiate analgesic: Secondary | ICD-10-CM | POA: Diagnosis not present

## 2017-10-01 DIAGNOSIS — F17290 Nicotine dependence, other tobacco product, uncomplicated: Secondary | ICD-10-CM | POA: Diagnosis present

## 2017-10-01 DIAGNOSIS — Y92009 Unspecified place in unspecified non-institutional (private) residence as the place of occurrence of the external cause: Secondary | ICD-10-CM | POA: Diagnosis not present

## 2017-10-01 DIAGNOSIS — L89619 Pressure ulcer of right heel, unspecified stage: Secondary | ICD-10-CM | POA: Diagnosis not present

## 2017-10-01 DIAGNOSIS — J449 Chronic obstructive pulmonary disease, unspecified: Secondary | ICD-10-CM | POA: Diagnosis not present

## 2017-10-01 DIAGNOSIS — N183 Chronic kidney disease, stage 3 (moderate): Secondary | ICD-10-CM | POA: Diagnosis present

## 2017-10-01 DIAGNOSIS — L89613 Pressure ulcer of right heel, stage 3: Secondary | ICD-10-CM | POA: Diagnosis present

## 2017-10-01 DIAGNOSIS — J181 Lobar pneumonia, unspecified organism: Secondary | ICD-10-CM | POA: Diagnosis present

## 2017-10-01 DIAGNOSIS — Z833 Family history of diabetes mellitus: Secondary | ICD-10-CM | POA: Diagnosis not present

## 2017-10-01 DIAGNOSIS — E1149 Type 2 diabetes mellitus with other diabetic neurological complication: Secondary | ICD-10-CM | POA: Diagnosis not present

## 2017-10-01 DIAGNOSIS — J440 Chronic obstructive pulmonary disease with acute lower respiratory infection: Secondary | ICD-10-CM | POA: Diagnosis present

## 2017-10-01 DIAGNOSIS — D649 Anemia, unspecified: Secondary | ICD-10-CM | POA: Insufficient documentation

## 2017-10-01 LAB — CBG MONITORING, ED
GLUCOSE-CAPILLARY: 67 mg/dL (ref 65–99)
GLUCOSE-CAPILLARY: 63 mg/dL — AB (ref 65–99)
Glucose-Capillary: 42 mg/dL — CL (ref 65–99)
Glucose-Capillary: 71 mg/dL (ref 65–99)
Glucose-Capillary: 54 mg/dL — ABNORMAL LOW (ref 65–99)

## 2017-10-01 LAB — CBC WITH DIFFERENTIAL/PLATELET
BASOS PCT: 0 %
Basophils Absolute: 0 10*3/uL (ref 0.0–0.1)
EOS ABS: 0.2 10*3/uL (ref 0.0–0.7)
EOS PCT: 1 %
HCT: 32.1 % — ABNORMAL LOW (ref 39.0–52.0)
HEMOGLOBIN: 10.6 g/dL — AB (ref 13.0–17.0)
Lymphocytes Relative: 14 %
Lymphs Abs: 3.3 10*3/uL (ref 0.7–4.0)
MCH: 30.4 pg (ref 26.0–34.0)
MCHC: 33 g/dL (ref 30.0–36.0)
MCV: 92 fL (ref 78.0–100.0)
Monocytes Absolute: 0.8 10*3/uL (ref 0.1–1.0)
Monocytes Relative: 4 %
NEUTROS PCT: 81 %
Neutro Abs: 18.5 10*3/uL — ABNORMAL HIGH (ref 1.7–7.7)
PLATELETS: 215 10*3/uL (ref 150–400)
RBC: 3.49 MIL/uL — ABNORMAL LOW (ref 4.22–5.81)
RDW: 13.3 % (ref 11.5–15.5)
WBC: 22.8 10*3/uL — ABNORMAL HIGH (ref 4.0–10.5)

## 2017-10-01 LAB — URINALYSIS, COMPLETE (UACMP) WITH MICROSCOPIC
BILIRUBIN URINE: NEGATIVE
Glucose, UA: >=500 mg/dL — AB
Hgb urine dipstick: NEGATIVE
Ketones, ur: NEGATIVE mg/dL
Leukocytes, UA: NEGATIVE
NITRITE: NEGATIVE
PH: 5 (ref 5.0–8.0)
Protein, ur: NEGATIVE mg/dL
Specific Gravity, Urine: 1.018 (ref 1.005–1.030)

## 2017-10-01 LAB — RAPID URINE DRUG SCREEN, HOSP PERFORMED
Amphetamines: NOT DETECTED
BARBITURATES: NOT DETECTED
BENZODIAZEPINES: NOT DETECTED
COCAINE: NOT DETECTED
Opiates: NOT DETECTED
TETRAHYDROCANNABINOL: NOT DETECTED

## 2017-10-01 LAB — BASIC METABOLIC PANEL
Anion gap: 9 (ref 5–15)
BUN: 25 mg/dL — ABNORMAL HIGH (ref 6–20)
CALCIUM: 8.6 mg/dL — AB (ref 8.9–10.3)
CHLORIDE: 110 mmol/L (ref 101–111)
CO2: 25 mmol/L (ref 22–32)
CREATININE: 1.17 mg/dL (ref 0.61–1.24)
Glucose, Bld: 103 mg/dL — ABNORMAL HIGH (ref 65–99)
Potassium: 4 mmol/L (ref 3.5–5.1)
SODIUM: 144 mmol/L (ref 135–145)

## 2017-10-01 LAB — PROTIME-INR
INR: 1.16
PROTHROMBIN TIME: 14.7 s (ref 11.4–15.2)

## 2017-10-01 LAB — HIV ANTIBODY (ROUTINE TESTING W REFLEX): HIV SCREEN 4TH GENERATION: NONREACTIVE

## 2017-10-01 LAB — GLUCOSE, CAPILLARY
Glucose-Capillary: 106 mg/dL — ABNORMAL HIGH (ref 65–99)
Glucose-Capillary: 117 mg/dL — ABNORMAL HIGH (ref 65–99)
Glucose-Capillary: 159 mg/dL — ABNORMAL HIGH (ref 65–99)

## 2017-10-01 LAB — SODIUM, URINE, RANDOM: Sodium, Ur: 37 mmol/L

## 2017-10-01 LAB — PROCALCITONIN: Procalcitonin: 5.68 ng/mL

## 2017-10-01 LAB — LACTIC ACID, PLASMA
LACTIC ACID, VENOUS: 2 mmol/L — AB (ref 0.5–1.9)
Lactic Acid, Venous: 1.3 mmol/L (ref 0.5–1.9)

## 2017-10-01 LAB — STREP PNEUMONIAE URINARY ANTIGEN: STREP PNEUMO URINARY ANTIGEN: NEGATIVE

## 2017-10-01 LAB — CREATININE, URINE, RANDOM: Creatinine, Urine: 152.79 mg/dL

## 2017-10-01 MED ORDER — NALOXONE HCL 0.4 MG/ML IJ SOLN
INTRAMUSCULAR | Status: AC
  Filled 2017-10-01: qty 1

## 2017-10-01 MED ORDER — DEXTROSE 50 % IV SOLN
12.5000 g | Freq: Once | INTRAVENOUS | Status: AC
  Administered 2017-10-01: 12.5 g via INTRAVENOUS

## 2017-10-01 MED ORDER — NALOXONE HCL 0.4 MG/ML IJ SOLN
0.2000 mg | Freq: Once | INTRAMUSCULAR | Status: AC
Start: 2017-10-01 — End: 2017-10-01
  Administered 2017-10-01: 0.2 mg via INTRAVENOUS

## 2017-10-01 MED ORDER — INSULIN ASPART 100 UNIT/ML ~~LOC~~ SOLN
0.0000 [IU] | Freq: Three times a day (TID) | SUBCUTANEOUS | Status: DC
  Administered 2017-10-02: 5 [IU] via SUBCUTANEOUS
  Administered 2017-10-02: 2 [IU] via SUBCUTANEOUS
  Administered 2017-10-02 – 2017-10-03 (×4): 3 [IU] via SUBCUTANEOUS
  Administered 2017-10-04: 1 [IU] via SUBCUTANEOUS
  Administered 2017-10-04: 2 [IU] via SUBCUTANEOUS

## 2017-10-01 MED ORDER — HYDRALAZINE HCL 20 MG/ML IJ SOLN: 10.0000 mg | Freq: Four times a day (QID) | INTRAMUSCULAR | Status: DC | PRN

## 2017-10-01 MED ORDER — SODIUM CHLORIDE 0.9 % IV SOLN
1000.0000 mL | INTRAVENOUS | Status: DC
  Administered 2017-10-01 – 2017-10-02 (×2): 1000 mL via INTRAVENOUS

## 2017-10-01 MED ORDER — METOPROLOL SUCCINATE ER 25 MG PO TB24
12.5000 mg | ORAL_TABLET | Freq: Every day | ORAL | Status: DC
  Administered 2017-10-01 – 2017-10-04 (×4): 12.5 mg via ORAL
  Filled 2017-10-01 (×4): qty 1

## 2017-10-01 MED ORDER — SODIUM CHLORIDE 0.9 % IV SOLN
1000.0000 mL | INTRAVENOUS | Status: DC
Start: 2017-10-01 — End: 2017-10-01
  Administered 2017-10-01: 1000 mL via INTRAVENOUS

## 2017-10-01 MED ORDER — SODIUM CHLORIDE 0.9 % IV SOLN
500.0000 mg | Freq: Once | INTRAVENOUS | Status: AC
  Administered 2017-10-01: 500 mg via INTRAVENOUS
  Filled 2017-10-01: qty 500

## 2017-10-01 MED ORDER — HYDRALAZINE HCL 20 MG/ML IJ SOLN: 5.0000 mg | INTRAMUSCULAR | Status: DC | PRN

## 2017-10-01 MED ORDER — INSULIN GLARGINE 100 UNIT/ML ~~LOC~~ SOLN
30.0000 [IU] | Freq: Every day | SUBCUTANEOUS | Status: DC
  Administered 2017-10-01: 30 [IU] via SUBCUTANEOUS
  Filled 2017-10-01 (×2): qty 0.3

## 2017-10-01 MED ORDER — ENOXAPARIN SODIUM 40 MG/0.4ML ~~LOC~~ SOLN
40.0000 mg | SUBCUTANEOUS | Status: DC
  Administered 2017-10-01 – 2017-10-04 (×4): 40 mg via SUBCUTANEOUS
  Filled 2017-10-01 (×4): qty 0.4

## 2017-10-01 MED ORDER — POTASSIUM CHLORIDE 10 MEQ/100ML IV SOLN
10.0000 meq | INTRAVENOUS | Status: AC
  Administered 2017-10-01 (×2): 10 meq via INTRAVENOUS
  Filled 2017-10-01: qty 100

## 2017-10-01 MED ORDER — NALOXONE HCL 0.4 MG/ML IJ SOLN
0.2000 mg | Freq: Once | INTRAMUSCULAR | Status: AC
  Administered 2017-10-01: 0.2 mg via INTRAVENOUS

## 2017-10-01 MED ORDER — NICOTINE 21 MG/24HR TD PT24
21.0000 mg | MEDICATED_PATCH | Freq: Every day | TRANSDERMAL | Status: DC
  Administered 2017-10-04: 21 mg via TRANSDERMAL
  Filled 2017-10-01 (×4): qty 1

## 2017-10-01 MED ORDER — DEXTROSE 50 % IV SOLN
INTRAVENOUS | Status: AC
  Filled 2017-10-01: qty 50

## 2017-10-01 MED ORDER — SODIUM CHLORIDE 0.9 % IV BOLUS
1500.0000 mL | Freq: Once | INTRAVENOUS | Status: AC
  Administered 2017-10-01: 1000 mL via INTRAVENOUS

## 2017-10-01 MED ORDER — CEFTRIAXONE SODIUM 1 G IJ SOLR
1.0000 g | INTRAMUSCULAR | Status: DC
  Administered 2017-10-01 – 2017-10-04 (×3): 1 g via INTRAVENOUS
  Filled 2017-10-01 (×3): qty 1

## 2017-10-01 MED ORDER — ALBUTEROL SULFATE (2.5 MG/3ML) 0.083% IN NEBU: 2.5000 mg | INHALATION_SOLUTION | RESPIRATORY_TRACT | Status: DC | PRN

## 2017-10-01 MED ORDER — METOPROLOL TARTRATE 5 MG/5ML IV SOLN
5.0000 mg | Freq: Three times a day (TID) | INTRAVENOUS | Status: DC
  Administered 2017-10-01 (×3): 5 mg via INTRAVENOUS
  Filled 2017-10-01 (×3): qty 5

## 2017-10-01 MED ORDER — ONDANSETRON HCL 4 MG/2ML IJ SOLN: 4.0000 mg | Freq: Three times a day (TID) | INTRAMUSCULAR | Status: DC | PRN

## 2017-10-01 MED ORDER — DULOXETINE HCL 60 MG PO CPEP
60.0000 mg | ORAL_CAPSULE | Freq: Every day | ORAL | Status: DC
  Administered 2017-10-01 – 2017-10-04 (×4): 60 mg via ORAL
  Filled 2017-10-01 (×4): qty 1

## 2017-10-01 MED ORDER — SODIUM CHLORIDE 0.9 % IV SOLN
500.0000 mg | INTRAVENOUS | Status: DC
  Administered 2017-10-01 – 2017-10-03 (×3): 500 mg via INTRAVENOUS
  Filled 2017-10-01 (×3): qty 500

## 2017-10-01 MED ORDER — POTASSIUM CHLORIDE 10 MEQ/100ML IV SOLN
INTRAVENOUS | Status: AC
  Administered 2017-10-01: 10 meq via INTRAVENOUS
  Filled 2017-10-01: qty 100

## 2017-10-01 MED ORDER — BUSPIRONE HCL 10 MG PO TABS
30.0000 mg | ORAL_TABLET | Freq: Two times a day (BID) | ORAL | Status: DC
  Administered 2017-10-01 – 2017-10-04 (×6): 30 mg via ORAL
  Filled 2017-10-01 (×3): qty 3
  Filled 2017-10-01: qty 6
  Filled 2017-10-01 (×2): qty 3
  Filled 2017-10-01 (×2): qty 6
  Filled 2017-10-01: qty 3
  Filled 2017-10-01 (×2): qty 6

## 2017-10-01 MED ORDER — ACETAMINOPHEN 650 MG RE SUPP: 650.0000 mg | Freq: Four times a day (QID) | RECTAL | Status: DC | PRN

## 2017-10-01 NOTE — Progress Notes (Signed)
PROGRESS NOTE  Jeremy Gill WJX:[id number] DOB: [birth year] DOA: 09/30/2017 PCP: [place], [name], MD  HPI/Recap of past 24 hours: Jeremy Gill is a 72 y.o. male with medical history significant for hypertension, hyperlipidemia, diabetes mellitus, depression, anxiety, tobacco abuse, psoriasis, CKD 3, chronic back pain, presents with altered mental status, noted to be more lethargic and sleepy. As per wife, pt got up early in the am PTA in ED, took his meds, which he never did as he usually gets confused about his meds (wife usually gives his meds). Pt has severe chronic back pain for which he takes tramadol and baclofen. Wife unsure if he OD.  Denied any other symptoms.  In the ED, pt is barely arousable, not oriented x3, moves all extremities slightly upon painful stimuli, pinpoint pupils. Noted to have leukocytosis, chest x-ray showed bilateral basilar opacity, CT head negative.  Patient admitted for further management  Today, patient was alert, awake, noted to have episodes of hypoglycemia as he had been made n.p.o. denies any new complaints.  Wife at bedside.    Assessment/Plan: Principal Problem:   Acute metabolic encephalopathy Active Problems:   Diabetes mellitus type 2 with neurological manifestations (HCC)   TOBACCO ABUSE   Essential hypertension   COPD (chronic obstructive pulmonary disease) (HCC)   Depression   Acute renal failure superimposed on stage 3 chronic kidney disease (HCC)   Sepsis (HCC)   Hypokalemia  Sepsis likely 2/2 ??CAP Vs aspiration pneumonia Afebrile, with worsening leukocytosis LA elevated, currently down trended to 1.3 Procalcitonin elevated at 5.68, will trend BC x2 pending UA negative, UC pending Urine strep negative Chest x-ray showed bibasilar opacity Continue IV ceftriaxone, azithromycin, may switch if not improving PRN albuterol SLP evaluation Continue IV fluids  Acute metabolic encephalopathy Improving Likely narcotics OD induced Vs  ??CAP Mental status improved slightly after Narcan x2 was given UDS negative CT head negative for acute intracranial abnormality MRI head negative for any acute changes Monitor closely  AKI on CKD-II Resolved, status post fluids Baseline Cr is 1.0-1.2., pt's Cre is 1.81 on admission IV fluids Daily BMP  DM type 2 with hypoglycemia Had episodes of hypos (has been NPO Vs ?OD on meds) Last A1c 10.0 on 08/20/17, poorly controlled Sensitive SSI, accuchecks Held Lantus, Victoza, metformin, may restart pending CBGs  Stage 3 pressure sore to R heel ??PVD Pt scheduled to have arterial doppler as an outpt 10/02/17, ordered for inpatient Wound consult  Essential hypertension Continue home metoprolol IV hydralazine as needed  Chronic severe back pain Due to possibility of OD, will hold home gabapentin, tramadol, baclofen Please kindly reassess in am and gradually start meds to avoid withdrawal  Depression/anxiety  Started home cymbalta and buspirone May add on home amitriptyline once very stable  Tobacco abuse Advised to quit Nicotine patch     Code Status: Full  Family Communication: Wife at bedside  Disposition Plan: To be determined   Consultants:  None  Procedures:  None  Antimicrobials:  Azithromycin  Ceftriaxone  DVT prophylaxis: Lovenox   Objective: Vitals:   10/01/17 0930 10/01/17 1030 10/01/17 1329 10/01/17 1734  BP: 134/82 139/76 (!) 142/78   Pulse: 100 (!) 103 (!) 107   Resp: Temp:   98 F (36.7 C)   TempSrc:   Oral   SpO2: 98% 98% 100%   Weight:    80.8 kg (178 lb 2.1 oz)  Height:    6' (1.829 m)    Intake/Output  Summary (Last 24 hours) at 10/01/2017 1821 Last data filed at 10/01/2017 1601 Gross per 24 hour  Intake 3145 ml  Output 400 ml  Net 2745 ml   Filed Weights   10/01/17 1734  Weight: 80.8 kg (178 lb 2.1 oz)    Exam:   General: Alert, awake, NAD  Cardiovascular: S1, S2 present  Respiratory:  CTAB  Abdomen: Soft, nontender, nondistended, bowel sounds present  Musculoskeletal: No pedal edema bilaterally, wound on right heel  Skin: Normal  Psychiatry: Normal mood   Data Reviewed: CBC: Recent Labs  Lab 09/30/17 2205 10/01/17 1334  WBC 15.7* 22.8*  NEUTROABS 13.6* 18.5*  HGB 10.8* 10.6*  HCT 32.3* 32.1*  MCV 90.0 92.0  PLT 223 215   Basic Metabolic Panel: Recent Labs  Lab 09/30/17 2205 10/01/17 1334  NA 140 144  K 3.4* 4.0  CL 102 110  CO2 26 25  GLUCOSE 272* 103*  BUN 38* 25*  CREATININE 1.81* 1.17  CALCIUM 9.3 8.6*   GFR: Estimated Creatinine Clearance: 62.6 mL/min (by C-G formula based on SCr of 1.17 mg/dL). Liver Function Tests: Recent Labs  Lab 09/30/17 2205  AST 20  ALT 18  ALKPHOS 34*  BILITOT 0.6  PROT 7.0  ALBUMIN 3.6   No results for input(s): LIPASE, AMYLASE in the last 168 hours. Recent Labs  Lab 09/30/17 2101  AMMONIA 12   Coagulation Profile: Recent Labs  Lab 10/01/17 0146  INR 1.16   Cardiac Enzymes: No results for input(s): CKTOTAL, CKMB, CKMBINDEX, TROPONINI in the last 168 hours. BNP (last 3 results) No results for input(s): PROBNP in the last 8760 hours. HbA1C: No results for input(s): HGBA1C in the last 72 hours. CBG: Recent Labs  Lab 10/01/17 1153 10/01/17 1158 10/01/17 1250 10/01/17 1331 10/01/17 1622  GLUCAP 63* 71 54* 106* 117*   Lipid Profile: No results for input(s): CHOL, HDL, LDLCALC, TRIG, CHOLHDL, LDLDIRECT in the last 72 hours. Thyroid Function Tests: No results for input(s): TSH, T4TOTAL, FREET4, T3FREE, THYROIDAB in the last 72 hours. Anemia Panel: No results for input(s): VITAMINB12, FOLATE, FERRITIN, TIBC, IRON, RETICCTPCT in the last 72 hours. Urine analysis:    Component Value Date/Time   COLORURINE YELLOW 09/30/2017 2347   APPEARANCEUR HAZY (A) 09/30/2017 2347   LABSPEC 1.018 09/30/2017 2347   PHURINE 5.0 09/30/2017 2347   GLUCOSEU >=500 (A) 09/30/2017 2347   GLUCOSEU NEGATIVE  09/30/2008 0851   HGBUR NEGATIVE 09/30/2017 2347   HGBUR trace-lysed 03/29/2010 0940   BILIRUBINUR NEGATIVE 09/30/2017 2347   BILIRUBINUR neg 01/13/2014 1141   KETONESUR NEGATIVE 09/30/2017 2347   PROTEINUR NEGATIVE 09/30/2017 2347   UROBILINOGEN 1.0 01/13/2014 1141   UROBILINOGEN 0.2 03/29/2010 0940   NITRITE NEGATIVE 09/30/2017 2347   LEUKOCYTESUR NEGATIVE 09/30/2017 2347   Sepsis Labs: (procalcitonin:4,lacticidven:4)  )No results found for this or any previous visit (from the past 240 hour(s)).    Studies: Ct Head Wo Contrast  Result Date: 09/30/2017 CLINICAL DATA:  Altered level of consciousness for 1 week. EXAM: CT HEAD WITHOUT CONTRAST TECHNIQUE: Contiguous axial images were obtained from the base of the skull through the vertex without intravenous contrast. COMPARISON:  12/18/2016 FINDINGS: Brain: No evidence of acute infarction, hemorrhage, hydrocephalus, extra-axial collection or mass lesion/mass effect. There is ventricular and sulcal enlargement reflecting mild atrophy. Patchy white matter hypoattenuation is seen bilaterally consistent with moderate to advanced chronic microvascular ischemic change. There are few small deep white matter lacune infarcts, chronic. Vascular: No hyperdense vessel or unexpected calcification. Skull:  Normal. Negative for fracture or focal lesion. Sinuses/Orbits: Visualize globes and orbits are unremarkable. Visualized sinuses and mastoid air cells are clear. Other: None. IMPRESSION: 1. No acute intracranial abnormalities. 2. 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 Electronically Signed   By: Amie Portland M.D.   On: 09/30/2017 21:44   Mr Brain Wo Contrast  Result Date: 10/01/2017 CLINICAL DATA:  Altered level of consciousness. Lethargy beginning yesterday. Evaluate for CVA EXAM: MRI HEAD WITHOUT CONTRAST TECHNIQUE: Multiplanar, multiecho pulse sequences of the brain and  surrounding structures were obtained without intravenous contrast. COMPARISON:  Head CT from yesterday FINDINGS: Brain: Extensive FLAIR hyperintensity in the cerebral white matter and pons without mass effect. There are numerous chronic lacunar infarcts in the bilateral centrum semiovale, bilateral thalamus, and left corona radiata. Prominent number of dilated perivascular spaces at the deep gray nuclei. No chronic blood products are seen. No acute infarct, hemorrhage, hydrocephalus, or collection. Vascular: Major flow voids are preserved. Skull and upper cervical spine: No evidence of marrow lesion Sinuses/Orbits: Left cataract resection. IMPRESSION: 1. No acute finding.  Negative for acute infarct. 2. 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 Electronically Signed   By: Marnee Spring M.D.   On: 10/01/2017 11:57   Dg Chest Port 1 View  Result Date: 09/30/2017 CLINICAL DATA:  patient coming from home, wife reports patient has been mentally altered x1 week.H/o COPD, Diabetes type 1 and 2, HTN. Smoker. EXAM: PORTABLE CHEST 1 VIEW COMPARISON:  09/08/2007 FINDINGS: Cardiac silhouette is normal in size. No mediastinal or hilar masses. Lungs are hyperexpanded. There is opacity at both lung bases which is new from the prior exam. Remainder of the lungs is clear. No convincing pleural effusion.  No pneumothorax. Skeletal structures are grossly intact. IMPRESSION: 1. Bilateral lung base opacity. This may reflect scarring or interstitial thickening that has developed since the prior study. Bibasilar pneumonia should be considered if there are consistent clinical findings. 2. Hyperexpanded lungs consistent with underlying COPD. Electronically Signed   By: Amie Portland M.D.   On: 09/30/2017 21:45    Scheduled Meds: . dextrose      . enoxaparin (LOVENOX) injection  40 mg Subcutaneous Q24H  . insulin aspart  0-9 Units Subcutaneous TID WC  . metoprolol tartrate  5 mg Intravenous Q8H     Continuous Infusions: . sodium chloride 1,000 mL (10/01/17 1334)  . azithromycin    . cefTRIAXone (ROCEPHIN)  IV       LOS: 0 days     Briant Cedar, MD [facility]   If 7PM-7AM, please contact night-coverage [email or web address] Password [facility] 10/01/2017, 6:21 PM

## 2017-10-01 NOTE — ED Notes (Signed)
ED TO INPATIENT HANDOFF REPORT  Name/Age/Gender Jeremy Gill 73 y.o. male  Code Status    Code Status Orders  (From admission, onward)        Start     Ordered   10/01/17 0016  Full code  Continuous     10/01/17 0016    Code Status History    Date Active Date Inactive Code Status Order ID Comments User Context   12/18/2016 1924 12/20/2016 1635 Full Code [id number]  Jani Gravel, MD Inpatient      Home/SNF/Other Home  Chief Complaint Altered Mental Status; Hyperglycemia  Level of Care/Admitting Diagnosis ED Disposition    ED Disposition Condition Comment   Admit  Hospital Area: [facility] [place] [[id number]]  Level of Care: Telemetry [5]  Diagnosis: Acute metabolic encephalopathy [0938182]  Admitting Physician: Ivor Costa [[id number]]  Attending Physician: Ivor Costa [phone]  Estimated length of stay: past midnight tomorrow  Certification:: I certify this patient will need inpatient services for at least 2 midnights  PT Class (Do Not Modify): Inpatient [101]  PT Acc Code (Do Not Modify): Private [1]       Medical History Past Medical History:  Diagnosis Date  . ANKLE SPRAIN 12/16/2006   Qualifier: Diagnosis of  By: Sherren Mocha MD, Jory Ee   . ANXIETY 10/25/2006  . CELLULITIS, LEG, RIGHT 09/08/2007   Qualifier: Diagnosis of  By: Sherren Mocha MD, Jory Ee   . Chronic pain disorder 11/30/2015  . COPD 10/25/2006  . Diabetes mellitus type 2 with neurological manifestations ([place]) 10/25/2006   Qualifier: Diagnosis of  By: Cari Caraway RN, [place] Sellers   . DIABETES MELLITUS, TYPE I 10/25/2006  . Diabetic polyneuropathy ([place]) 04/08/2007   Qualifier: Diagnosis of  By: Sherren Mocha MD, Jory Ee   . 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 Essential hypertension 10/25/2006   Qualifier: Diagnosis of  By: Cari Caraway RN, [place]ura Sellers   . Generalized osteoarthritis of multiple sites 11/30/2015  . HYPERTENSION 10/25/2006  . 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  Low back pain with radiation 01/23/2016  . Polyneuropathy in diabetes(357.2) 04/08/2007  . Psoriasis   . TOBACCO ABUSE 07/08/2007  . VENOUS INSUFFICIENCY 11/25/2007    Allergies No Known Allergies  IV Location/Drains/Wounds Patient Lines/Drains/Airways Status   Active Line/Drains/Airways    Name:   Placement date:   Placement time:   Site:   Days:   Peripheral IV 09/30/17 Right Forearm   09/30/17    2203    Forearm   1   Peripheral IV 10/01/17 Left;Medial Forearm   10/01/17    0034    Forearm   less than 1          Labs/Imaging Results for orders placed or performed during the hospital encounter of 09/30/17 (from the past 48 hour(s))  Ammonia     Status: None   Collection Time: 09/30/17  9:01 PM  Result Value Ref Range   Ammonia 12 9 - 35 umol/L    Comment: Performed at [facility], [place] [address] [place], [place] [address]  CBG monitoring, ED     Status: Abnormal   Collection Time: 09/30/17  9:33 PM  Result Value Ref Range   Glucose-Capillary 274 (H) 65 - 99 mg/dL   Comment 1 Notify RN   Comprehensive metabolic panel     Status: Abnormal   Collection Time: 09/30/17 10:05 PM  Result Value Ref Range   Sodium 140  135 - 145 mmol/L   Potassium 3.4 (L) 3.5 - 5.1 mmol/L   Chloride 102 101 - 111 mmol/L   CO2 26 22 - 32 mmol/L   Glucose, Bld 272 (H) 65 - 99 mg/dL   BUN 38 (H) 6 - 20 mg/dL   Creatinine, Ser 1.81 (H) 0.61 - 1.24 mg/dL   Calcium 9.3 8.9 - 10.3 mg/dL   Total Protein 7.0 6.5 - 8.1 g/dL   Albumin 3.6 3.5 - 5.0 g/dL   AST 20 15 - 41 U/L   ALT 18 17 - 63 U/L   Alkaline Phosphatase 34 (L) 38 - 126 U/L   Total Bilirubin 0.6 0.3 - 1.2 mg/dL   GFR calc non Af Amer 36 (L) >60 mL/min   GFR calc Af Amer 41 (L) >60 mL/min    Comment: (NOTE) The eGFR has been calculated using the CKD EPI equation. This calculation has not been validated in all clinical situations. eGFR's persistently <60 mL/min signify possible Chronic Kidney Disease.    Anion gap  12 5 - 15    Comment: Performed at [facility], [place] [address], [place], [place] [address]  CBC WITH DIFFERENTIAL     Status: Abnormal   Collection Time: 09/30/17 10:05 PM  Result Value Ref Range   WBC 15.7 (H) 4.0 - 10.5 K/uL   RBC 3.59 (L) 4.22 - 5.81 MIL/uL   Hemoglobin 10.8 (L) 13.0 - 17.0 g/dL   HCT 32.3 (L) 39.0 - 52.0 %   MCV 90.0 78.0 - 100.0 fL   MCH 30.1 26.0 - 34.0 pg   MCHC 33.4 30.0 - 36.0 g/dL   RDW 13.0 11.5 - 15.5 %   Platelets 223 150 - 400 K/uL   Neutrophils Relative % 87 %   Neutro Abs 13.6 (H) 1.7 - 7.7 K/uL   Lymphocytes Relative 7 %   Lymphs Abs 1.1 0.7 - 4.0 K/uL   Monocytes Relative 6 %   Monocytes Absolute 1.0 0.1 - 1.0 K/uL   Eosinophils Relative 0 %   Eosinophils Absolute 0.0 0.0 - 0.7 K/uL   Basophils Relative 0 %   Basophils Absolute 0.0 0.0 - 0.1 K/uL    Comment: Performed at [facility], [place] [address], [place], [place] [address]  Ethanol     Status: None   Collection Time: 09/30/17 10:05 PM  Result Value Ref Range   Alcohol, Ethyl (B) <10 <10 mg/dL    Comment:        LOWEST DETECTABLE LIMIT FOR SERUM ALCOHOL IS 10 mg/dL FOR MEDICAL PURPOSES ONLY Performed at [facility], [place] [address] [place], [place] [address]   Blood gas, venous     Status: Abnormal   Collection Time: 09/30/17 10:09 PM  Result Value Ref Range   FIO2 21.00    pH, Ven 7.329 7.250 - 7.430   pCO2, Ven 54.2 44.0 - 60.0 mmHg   Bicarbonate 28.1 (H) 20.0 - 28.0 mmol/L   Acid-Base Excess 1.6 0.0 - 2.0 mmol/L   O2 Saturation 40.7 %   Patient temperature 97.3    Collection site VEIN    Drawn by DRAWN BY RN    Sample type VENOUS     Comment: Performed at [facility], [place] [address], ,  [address]  Urinalysis, Complete w Microscopic     Status: Abnormal   Collection Time: 09/30/17 11:47 PM  Result Value Ref Range   Color, Urine YELLOW YELLOW  APPearance HAZY (A) CLEAR   Specific  Gravity, Urine 1.018 1.005 - 1.030   pH 5.0 5.0 - 8.0   Glucose, UA >=500 (A) NEGATIVE mg/dL   Hgb urine dipstick NEGATIVE NEGATIVE   Bilirubin Urine NEGATIVE NEGATIVE   Ketones, ur NEGATIVE NEGATIVE mg/dL   Protein, ur NEGATIVE NEGATIVE mg/dL   Nitrite NEGATIVE NEGATIVE   Leukocytes, UA NEGATIVE NEGATIVE   RBC / HPF 0-5 0 - 5 RBC/hpf   WBC, UA 0-5 0 - 5 WBC/hpf   Bacteria, UA RARE (A) NONE SEEN   Squamous Epithelial / LPF 0-5 0 - 5    Comment: Please note change in reference range.   Mucus PRESENT    Hyaline Casts, UA PRESENT     Comment: Performed at [facility], [place] [address], [place], [place] [address]  Urine rapid drug screen (hosp performed)     Status: None   Collection Time: 09/30/17 11:47 PM  Result Value Ref Range   Opiates NONE DETECTED NONE DETECTED   Cocaine NONE DETECTED NONE DETECTED   Benzodiazepines NONE DETECTED NONE DETECTED   Amphetamines NONE DETECTED NONE DETECTED   Tetrahydrocannabinol NONE DETECTED NONE DETECTED   Barbiturates NONE DETECTED NONE DETECTED    Comment: (NOTE) DRUG SCREEN FOR MEDICAL PURPOSES ONLY.  IF CONFIRMATION IS NEEDED FOR ANY PURPOSE, NOTIFY LAB WITHIN 5 DAYS. LOWEST DETECTABLE LIMITS FOR URINE DRUG SCREEN Drug Class                     Cutoff (ng/mL) Amphetamine and metabolites    1000 Barbiturate and metabolites    200 Benzodiazepine                 366 Tricyclics and metabolites     300 Opiates and metabolites        300 Cocaine and metabolites        300 THC                            50 Performed at [facility], [place] [address], [place], [place] [address]   Creatinine, urine, random     Status: None   Collection Time: 09/30/17 11:47 PM  Result Value Ref Range   Creatinine, Urine 152.79 mg/dL    Comment: Performed at [facility], [place] [address], [place], [place] [address]  Sodium, urine, random     Status: None   Collection Time: 09/30/17 11:47 PM  Result  Value Ref Range   Sodium, Ur 37 mmol/L    Comment: Performed at [facility], [place] [address] [place], [place] [address]  Lactic acid, plasma     Status: Abnormal   Collection Time: 10/01/17  1:46 AM  Result Value Ref Range   Lactic Acid, Venous 2.0 (HH) 0.5 - 1.9 mmol/L    Comment: CRITICAL RESULT CALLED TO, READ BACK BY AND VERIFIED WITHHenrene Dodge RN [phone] 10/01/17 A NAVARRO Performed at [facility], [place] [address] [place],  [address]   Procalcitonin     Status: None   Collection Time: 10/01/17  1:46 AM  Result Value Ref Range   Procalcitonin 5.68 ng/mL    Comment:        Interpretation: PCT > 2 ng/mL: Systemic infection (sepsis) is likely, unless other causes are known. (NOTE)       Sepsis PCT Algorithm  Lower Respiratory Tract                                      Infection PCT Algorithm    ----------------------------     ----------------------------         PCT < 0.25 ng/mL                PCT < 0.10 ng/mL         Strongly encourage             Strongly discourage   discontinuation of antibiotics    initiation of antibiotics    ----------------------------     -----------------------------       PCT 0.25 - 0.50 ng/mL            PCT 0.10 - 0.25 ng/mL               OR       >80% decrease in PCT            Discourage initiation of                                            antibiotics      Encourage discontinuation           of antibiotics    ----------------------------     -----------------------------         PCT >= 0.50 ng/mL              PCT 0.26 - 0.50 ng/mL               AND       <80% decrease in PCT              Encourage initiation of                                             antibiotics       Encourage continuation           of antibiotics    ----------------------------     -----------------------------        PCT >= 0.50 ng/mL                  PCT > 0.50 ng/mL               AND         increase in PCT                   Strongly encourage                                      initiation of antibiotics    Strongly encourage escalation           of antibiotics                                     -----------------------------  PCT <= 0.25 ng/mL                                                 OR                                        > 80% decrease in PCT                                     Discontinue / Do not initiate                                             antibiotics Performed at [place] [address], [place], [place] [address]   Protime-INR     Status: None   Collection Time: 10/01/17  1:46 AM  Result Value Ref Range   Prothrombin Time 14.7 11.4 - 15.2 seconds   INR 1.16     Comment: Performed at [facility], [place] [address] [place], [place] [address]   Ct Head Wo Contrast  Result Date: 09/30/2017 CLINICAL DATA:  Altered level of consciousness for 1 week. EXAM: CT HEAD WITHOUT CONTRAST TECHNIQUE: Contiguous axial images were obtained from the base of the skull through the vertex without intravenous contrast. COMPARISON:  12/18/2016 FINDINGS: Brain: No evidence of acute infarction, hemorrhage, hydrocephalus, extra-axial collection or mass lesion/mass effect. There is ventricular and sulcal enlargement reflecting mild atrophy. Patchy white matter hypoattenuation is seen bilaterally consistent with moderate to advanced chronic microvascular ischemic change. There are few small deep white matter lacune infarcts, chronic. Vascular: No hyperdense vessel or unexpected calcification. Skull: Normal. Negative for fracture or focal lesion. Sinuses/Orbits: Visualize globes and orbits are unremarkable. Visualized sinuses and mastoid air cells are clear. Other: None. IMPRESSION: 1. No acute intracranial abnormalities. 2. 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 Electronically Signed   By: Lajean Manes M.D.   On: 09/30/2017 21:44   Dg Chest Port 1 View  Result Date: 09/30/2017 CLINICAL DATA:  patient coming from home, wife reports patient has been mentally altered x1 week.H/o COPD, Diabetes type 1 and 2, HTN. Smoker. EXAM: PORTABLE CHEST 1 VIEW COMPARISON:  09/08/2007 FINDINGS: Cardiac silhouette is normal in size. No mediastinal or hilar masses. Lungs are hyperexpanded. There is opacity at both lung bases which is new from the prior exam. Remainder of the lungs is clear. No convincing pleural effusion.  No pneumothorax. Skeletal structures are grossly intact. IMPRESSION: 1. Bilateral lung base opacity. This may reflect scarring or interstitial thickening that has developed since the prior study. Bibasilar pneumonia should be considered if there are consistent clinical findings. 2. Hyperexpanded lungs consistent with underlying COPD. Electronically Signed   By: Lajean Manes M.D.   On: 09/30/2017 21:45    Pending Labs Unresulted Labs (From admission, onward)   Start     Ordered   10/01/17 0133  HIV antibody (Routine Screening)  Once,   R     10/01/17 0133   10/01/17 0133  Culture, sputum-assessment  Once,   R     10/01/17 0133   10/01/17 0133  Gram stain  Once,   R     10/01/17 0133   10/01/17 0133  Strep pneumoniae urinary antigen  Once,   R     10/01/17 0133   10/01/17 0133  Legionella Pneumophila Serogp 1 Ur Ag  Once,   R     10/01/17 0133   10/01/17 0015  Lactic acid, plasma  STAT Now then every 3 hours,   STAT     10/01/17 0014   10/01/17 0009  Culture, blood (Routine X 2) w Reflex to ID Panel  BLOOD CULTURE X 2,   R    Comments:  Please obtain prior to antibiotic administration.    10/01/17 0008   10/01/17 0009  Urine Culture  Once,   R    Comments:  Please get clean catch specimen ONLY (DO NOT obtain from urinal)    10/01/17 0008      Vitals/Pain Today's Vitals   10/01/17 0130 10/01/17 0200 10/01/17 0230  10/01/17 0300  BP: (!) 141/80 117/76 125/79 120/76  Pulse: (!) 103 (!) 106 (!) 106 (!) 109  Resp: _0 Temp:      TempSrc:      SpO2: 96% 98% 98% 97%    Isolation Precautions No active isolations  Medications Medications  sodium chloride 0.9 % bolus 500 mL (0 mLs Intravenous Stopped 10/01/17 0048)    Followed by  0.9 %  sodium chloride infusion (1,000 mLs Intravenous New Bag/Given 10/01/17 0015)  insulin glargine (LANTUS) injection 30 Units (30 Units Subcutaneous Given 10/01/17 0110)  hydrALAZINE (APRESOLINE) injection 5 mg (has no administration in time range)  metoprolol tartrate (LOPRESSOR) injection 5 mg (5 mg Intravenous Not Given 10/01/17 0015)  ondansetron (ZOFRAN) injection 4 mg (has no administration in time range)  acetaminophen (TYLENOL) suppository 650 mg (has no administration in time range)  insulin aspart (novoLOG) injection 0-9 Units (has no administration in time range)  enoxaparin (LOVENOX) injection 40 mg (has no administration in time range)  albuterol (PROVENTIL) (2.5 MG/3ML) 0.083% nebulizer solution 2.5 mg (has no administration in time range)  cefTRIAXone (ROCEPHIN) 1 g in sodium chloride 0.9 % 100 mL IVPB (has no administration in time range)  azithromycin (ZITHROMAX) 500 mg in sodium chloride 0.9 % 250 mL IVPB (has no administration in time range)  sodium chloride 0.9 % bolus 500 mL (0 mLs Intravenous Stopped 10/01/17 0000)  cefTRIAXone (ROCEPHIN) 1 g in sodium chloride 0.9 % 100 mL IVPB (0 g Intravenous Stopped 10/01/17 0048)  azithromycin (ZITHROMAX) 500 mg in sodium chloride 0.9 % 250 mL IVPB (0 mg Intravenous Stopped 10/01/17 0229)  naloxone (NARCAN) injection 0.2 mg (0.2 mg Intravenous Given 10/01/17 0022)  potassium chloride 10 mEq in 100 mL IVPB (0 mEq Intravenous Stopped 10/01/17 0301)  sodium chloride 0.9 % bolus 1,500 mL (0 mLs Intravenous Stopped 10/01/17 0154)  naloxone (NARCAN) injection 0.2 mg (0.2 mg Intravenous Given 10/01/17 0028)  naloxone [facility])  injection 0.2 mg (0.2 mg Intravenous Given 10/01/17 0129)    Mobility walks

## 2017-10-01 NOTE — ED Notes (Signed)
Bed: WA22 Expected date:  Expected time:  Means of arrival:  Comments: Rm 6 

## 2017-10-02 ENCOUNTER — Inpatient Hospital Stay (HOSPITAL_COMMUNITY): Payer: PPO

## 2017-10-02 ENCOUNTER — Ambulatory Visit (HOSPITAL_COMMUNITY): Payer: PPO

## 2017-10-02 DIAGNOSIS — L89150 Pressure ulcer of sacral region, unstageable: Secondary | ICD-10-CM

## 2017-10-02 DIAGNOSIS — L0390 Cellulitis, unspecified: Secondary | ICD-10-CM

## 2017-10-02 DIAGNOSIS — J449 Chronic obstructive pulmonary disease, unspecified: Secondary | ICD-10-CM

## 2017-10-02 LAB — CBC WITH DIFFERENTIAL/PLATELET
Basophils Absolute: 0 10*3/uL (ref 0.0–0.1)
Basophils Relative: 0 %
EOS PCT: 1 %
Eosinophils Absolute: 0.2 10*3/uL (ref 0.0–0.7)
HCT: 29.4 % — ABNORMAL LOW (ref 39.0–52.0)
Hemoglobin: 10 g/dL — ABNORMAL LOW (ref 13.0–17.0)
LYMPHS PCT: 17 %
Lymphs Abs: 3 10*3/uL (ref 0.7–4.0)
MCH: 30.6 pg (ref 26.0–34.0)
MCHC: 34 g/dL (ref 30.0–36.0)
MCV: 89.9 fL (ref 78.0–100.0)
MONO ABS: 0.6 10*3/uL (ref 0.1–1.0)
Monocytes Relative: 4 %
Neutro Abs: 13.7 10*3/uL — ABNORMAL HIGH (ref 1.7–7.7)
Neutrophils Relative %: 78 %
PLATELETS: 204 10*3/uL (ref 150–400)
RBC: 3.27 MIL/uL — AB (ref 4.22–5.81)
RDW: 13.2 % (ref 11.5–15.5)
WBC: 17.6 10*3/uL — ABNORMAL HIGH (ref 4.0–10.5)

## 2017-10-02 LAB — LEGIONELLA PNEUMOPHILA SEROGP 1 UR AG: L. pneumophila Serogp 1 Ur Ag: NEGATIVE

## 2017-10-02 LAB — BASIC METABOLIC PANEL
Anion gap: 13 (ref 5–15)
BUN: 18 mg/dL (ref 6–20)
CO2: 24 mmol/L (ref 22–32)
CREATININE: 0.88 mg/dL (ref 0.61–1.24)
Calcium: 8.7 mg/dL — ABNORMAL LOW (ref 8.9–10.3)
Chloride: 103 mmol/L (ref 101–111)
GFR calc Af Amer: >60 mL/min (ref >60)
GLUCOSE: 185 mg/dL — AB (ref 65–99)
POTASSIUM: 3.6 mmol/L (ref 3.5–5.1)
Sodium: 140 mmol/L (ref 135–145)

## 2017-10-02 LAB — GLUCOSE, CAPILLARY
GLUCOSE-CAPILLARY: 245 mg/dL — AB (ref 65–99)
Glucose-Capillary: 186 mg/dL — ABNORMAL HIGH (ref 65–99)
Glucose-Capillary: 273 mg/dL — ABNORMAL HIGH (ref 65–99)
Glucose-Capillary: 231 mg/dL — ABNORMAL HIGH (ref 65–99)

## 2017-10-02 LAB — URINE CULTURE: Culture: NO GROWTH

## 2017-10-02 LAB — PROCALCITONIN: PROCALCITONIN: 5.5 ng/mL

## 2017-10-02 MED ORDER — ASPIRIN 81 MG PO CHEW
81.0000 mg | CHEWABLE_TABLET | ORAL | Status: DC
  Administered 2017-10-04: 81 mg via ORAL
  Filled 2017-10-02: qty 1

## 2017-10-02 MED ORDER — GUAIFENESIN ER 600 MG PO TB12
1200.0000 mg | ORAL_TABLET | Freq: Two times a day (BID) | ORAL | Status: DC
  Administered 2017-10-02 – 2017-10-04 (×4): 1200 mg via ORAL
  Filled 2017-10-02 (×4): qty 2

## 2017-10-02 MED ORDER — TRAMADOL HCL 50 MG PO TABS: 50.0000 mg | ORAL_TABLET | Freq: Four times a day (QID) | ORAL | Status: DC | PRN

## 2017-10-02 MED ORDER — IPRATROPIUM-ALBUTEROL 0.5-2.5 (3) MG/3ML IN SOLN
3.0000 mL | Freq: Four times a day (QID) | RESPIRATORY_TRACT | Status: DC
  Administered 2017-10-02: 3 mL via RESPIRATORY_TRACT
  Filled 2017-10-02 (×2): qty 3

## 2017-10-02 MED ORDER — ATORVASTATIN CALCIUM 40 MG PO TABS
40.0000 mg | ORAL_TABLET | Freq: Every day | ORAL | Status: DC
  Administered 2017-10-02 – 2017-10-04 (×3): 40 mg via ORAL
  Filled 2017-10-02 (×3): qty 1

## 2017-10-02 MED ORDER — ZOLPIDEM TARTRATE 5 MG PO TABS
5.0000 mg | ORAL_TABLET | Freq: Every evening | ORAL | Status: DC | PRN
  Administered 2017-10-02 (×2): 5 mg via ORAL
  Filled 2017-10-02 (×2): qty 1

## 2017-10-02 NOTE — Progress Notes (Signed)
PROGRESS NOTE    Jeremy Gill  VWU:[id number] DOB: 1946/05/20 DOA: 09/30/2017 PCP: [place], [name], MD   Brief Narrative:  Jeremy Sandifer Bullardis a 72 y.o.malewith medical history significant for hypertension, hyperlipidemia, diabetes mellitus, depression, anxiety, tobacco abuse, psoriasis, CKD 3, chronic back pain, presents with altered mental status, noted to be more lethargic and sleepy. As per wife, pt got up early in the am PTA in ED, took his meds, which he never did as he usually gets confused about his meds (wife usually gives his meds). Pt has severe chronic back pain for which he takes tramadol and baclofen. Wife unsure if he OD.  Denied any other symptoms.  In the ED, pt is barelyarousable, not oriented x3, moves all extremities slightly upon painful stimuli, pinpoint pupils. Noted to have leukocytosis, chest x-ray showed bilateral basilar opacity, CT head negative.  Patient admitted for further management of bilateral community-acquired pneumonia with suspected possible aspiration.  Assessment & Plan:   Principal Problem:   Acute metabolic encephalopathy Active Problems:   Diabetes mellitus type 2 with neurological manifestations (HCC)   TOBACCO ABUSE   Essential hypertension   COPD (chronic obstructive pulmonary disease) (HCC)   Depression   Acute renal failure superimposed on stage 3 chronic kidney disease (HCC)   Sepsis (HCC)   Hypokalemia   Pressure injury of skin  Sepsis likely 2/2 Suspected CAP Vs Aspiration pneumonia Afebrile, with worsening leukocytosis; (WBC went from 15.7 -> 22.8 -> 17.6) -LA elevated, currently down trended to 1.3 -Procalcitonin elevated at 5.68 and slightly trended down to 5.50 -Blood Culture x2 showed NGTD at 1 Day  -UA negative, Urine Cx pending -Urine Strep Pneumo and L. Pneumophila Serogp 1 Ur Ag negative -Admission CXR read "Bilateral lung base opacity. This may reflect scarring or interstitial thickening that has developed since the prior  study. Bibasilar pneumonia should be considered if there are consistent clinical findings. Hyperexpanded lungs consistent with underlying COPD." -CXR this AM read as "COPD. Bibasilar pulmonary densities which may reflect acute atelectasis or pneumonia. This is new since the study of April 13,2009. However, chronic interstitial changes here could have developed over the intervening 10 years. A trial of appropriate antibiotics would be useful with follow-up radiographs to assess the lung parenchyma for improvement. Thoracic aortic atherosclerosis."  -Continue IV Abx with IV Ceftriaxone and IV Azithromycin -WBCs went from 22.8 and is now 17.6 -Will Add DuoNeb Breathing Treatments and continue PRN Albuterol -SLP evaluation done and patient ok for Regular Diet Thin Liquids  -Continue IVF rehydration as below -Add Incentive Spirometry, Flutter Valve and Mucinex   Acute Metabolic Encephalopathy -Improving -Likely narcotics OD induced Vs ??CAP -Mental status improved slightly after Narcan x2 was given UDS negative -CT head negative for acute Intracranial Abnormality -MRI head negative for any acute changes -Monitor closely  AKI on CKD-II -Improved, status post fluids; Given 500 mL bolus and then started on Maintenance IVF at 75 mL/hr -Baseline Cr is1.0-1.2., pt's Creatine was1.81 on admission -BUN/Creatinine is now 18/0.88 -Avoid Nephrotoxic Medications if possible -Repeat CMP in AM   DM type 2 with Hypoglycemia -Had episodes of hypoglycemic episodes (has been NPO Vs ?OD on meds) -Last A1c10.0 on 08/20/17, poorly controlled -C/w Sensitive Novolog SSI -Held Home Lantus, Victoza, Metformin; Takes  -CBGs ranging from 117-273 -We will consult diabetes education coordinator for further assistance management  Stage 3 pressure sore to R heel -??PVD -Pt scheduled to have arterial doppler as an outpt 10/02/17, ordered for inpatient and done today  however was unable to tolerate due to pain -ABIs  as below -WOC Nurse Consult appreciated   Essential Hypertension -Continue Metoprolol Succinate 12.5 mg p.o. daily -C/w IV Hydralazine 10 mg IV every 6 as needed for high blood pressure with systolic blood pressure greater than 175 as needed  Chronic Severe Back Pain -Due to possibility of Overdosing, held home Gabapentin, Tramadol, and Baclofen -We will resume home Tramadol 50 mg every 6 as needed -May resume home Baclofen and Gabapentin at reduced dose in a.m.  Depression/Anxiety  -C/w Home Duloxetine 60 mg po Daily and Buspirone 30 mg po BID -We will resume home amitriptyline once improved  Tobacco Abuse -Smoking Cessation Counseling given -C/w Nicotine Patch 21 mg TD  Normocytic Anemia -Hb/Hct stable at 10.0/29.4 -Continue to Monitor for S/Sx of Bleeding -Repeat CBC in AM   DVT prophylaxis:  Code Status: FULL CODE Family Communication: No family present at bedside  Disposition Plan: Remain Inpatient currently and pending PT Evaluation  Consultants:   None   Procedures:  ABI ABI completed: Patient was in severe back pain, could not keep still, trembling during the exam with insoluble movement.    RIGHT    LEFT    PRESSURE WAVEFORM  PRESSURE WAVEFORM  BRACHIAL 176 triphasic BRACHIAL 178 triphasic  DP 194 momophasic DP 192 biphasic  AT   AT    PT 134 triphasic PT 182 biphasic  PER   PER    GREAT TOE 87 normal GREAT TOE 92 normal    RIGHT LEFT  ABI 1.09 1.08     Antimicrobials:  Anti-infectives (From admission, onward)   Start     Dose/Rate Route Frequency Ordered Stop   10/01/17 2300  cefTRIAXone (ROCEPHIN) 1 g in sodium chloride 0.9 % 100 mL IVPB     1 g 200 mL/hr over 30 Minutes Intravenous Every 24 hours 10/01/17 0134     10/01/17 2200  azithromycin (ZITHROMAX) 500 mg in sodium chloride 0.9 % 250 mL IVPB     500 mg 250 mL/hr over 60 Minutes Intravenous Every 24 hours 10/01/17 0134     10/01/17 0015  azithromycin (ZITHROMAX)  500 mg in sodium chloride 0.9 % 250 mL IVPB     500 mg 250 mL/hr over 60 Minutes Intravenous  Once 10/01/17 0002 10/01/17 0229   09/30/17 2330  cefTRIAXone (ROCEPHIN) 1 g in sodium chloride 0.9 % 100 mL IVPB     1 g 200 mL/hr over 30 Minutes Intravenous  Once 09/30/17 2321 10/01/17 0048   09/30/17 2330  azithromycin (ZITHROMAX) tablet 500 mg  Status:  Discontinued     500 mg Oral  Once 09/30/17 2321 10/01/17 0002     Subjective: Seen and examined at bedside patient had improved.  Still remains slightly confused and did not know the month or the year but did know the President. States shortness of breath is okay. Denies any chest pain, lightheadedness, dizziness, nausea, vomiting.  Objective: Vitals:   10/01/17 1329 10/01/17 1734 10/01/17 2011 10/02/17 0347  BP: (!) 142/78  138/86 (!) 156/88  Pulse: (!) 107  (!) 101 (!) 103  Resp: Temp: 98 F (36.7 C)  98.3 F (36.8 C) 98.5 F (36.9 C)  TempSrc: Oral  Oral Oral  SpO2: 100%  100% 95%  Weight:  80.8 kg (178 lb 2.1 oz)    Height:  6' (1.829 m)      Intake/Output Summary (Last 24 hours) at 10/02/2017 1656 Last data filed  at 10/02/2017 1500 Gross per 24 hour  Intake 1487.5 ml  Output 3600 ml  Net -2112.5 ml   Filed Weights   10/01/17 1734  Weight: 80.8 kg (178 lb 2.1 oz)   Examination: Physical Exam:  Constitutional: WN/WD Caucasian male in NAD and appears calm and comfortable Eyes: Lids and conjunctivae normal, sclerae anicteric  ENMT: External Ears, Nose appear normal. Grossly normal hearing.  exudate or lesions. Extremely poor dentition.  Neck: Appears normal, supple, no cervical masses, normal ROM, no appreciable thyromegaly Respiratory: Diminished to Auscultation bilaterally with mild rhonchi and coarse breath sounds. Normal respiratory effort and patient is not tachypenic. No accessory muscle use.  Cardiovascular: RRR, no murmurs / rubs / gallops. S1 and S2 auscultated. No extremity edema.  Abdomen: Soft,  non-tender, non-distended. No masses palpated. No appreciable hepatosplenomegaly. Bowel sounds positive x4.  GU: Deferred. Musculoskeletal: No clubbing / cyanosis of digits/nails. No joint deformity upper and lower extremities.  Skin: No rashes on a limited skin eval. Has a Right Heel ulcer. No induration; Warm and dry.  Neurologic: CN 2-12 grossly intact with no focal deficits.  Romberg sign and cerebellar reflexes not assessed.  Psychiatric: Impaired judgment and insight. Alert and oriented x 1. Pleasant mood and appropriate affect.   Data Reviewed: I have personally reviewed following labs and imaging studies  CBC: Recent Labs  Lab 09/30/17 2205 10/01/17 1334 10/02/17 0514  WBC 15.7* 22.8* 17.6*  NEUTROABS 13.6* 18.5* 13.7*  HGB 10.8* 10.6* 10.0*  HCT 32.3* 32.1* 29.4*  MCV 90.0 92.0 89.9  PLT 223 215 204   Basic Metabolic Panel: Recent Labs  Lab 09/30/17 2205 10/01/17 1334 10/02/17 0514  NA 140 144 140  K 3.4* 4.0 3.6  CL 102 110 103  CO2 26 25 24   GLUCOSE 272* 103* 185*  BUN 38* 25* 18  CREATININE 1.81* 1.17 0.88  CALCIUM 9.3 8.6* 8.7*   GFR: Estimated Creatinine Clearance: 83.3 mL/min (by C-G formula based on SCr of 0.88 mg/dL). Liver Function Tests: Recent Labs  Lab 09/30/17 2205  AST 20  ALT 18  ALKPHOS 34*  BILITOT 0.6  PROT 7.0  ALBUMIN 3.6   No results for input(s): LIPASE, AMYLASE in the last 168 hours. Recent Labs  Lab 09/30/17 2101  AMMONIA 12   Coagulation Profile: Recent Labs  Lab 10/01/17 0146  INR 1.16   Cardiac Enzymes: No results for input(s): CKTOTAL, CKMB, CKMBINDEX, TROPONINI in the last 168 hours. BNP (last 3 results) No results for input(s): PROBNP in the last 8760 hours. HbA1C: No results for input(s): HGBA1C in the last 72 hours. CBG: Recent Labs  Lab 10/01/17 1331 10/01/17 1622 10/01/17 2042 10/02/17 0735 10/02/17 1221  GLUCAP 106* 117* 159* 186* 273*   Lipid Profile: No results for input(s): CHOL, HDL,  LDLCALC, TRIG, CHOLHDL, LDLDIRECT in the last 72 hours. Thyroid Function Tests: No results for input(s): TSH, T4TOTAL, FREET4, T3FREE, THYROIDAB in the last 72 hours. Anemia Panel: No results for input(s): VITAMINB12, FOLATE, FERRITIN, TIBC, IRON, RETICCTPCT in the last 72 hours. Sepsis Labs: Recent Labs  Lab 10/01/17 0146 10/01/17 0545 10/02/17 0514  PROCALCITON 5.68  --  5.50  LATICACIDVEN 2.0* 1.3  --     Recent Results (from the past 240 hour(s))  Urine Culture     Status: None   Collection Time: 09/30/17 11:47 PM  Result Value Ref Range Status   Specimen Description   Final    URINE, RANDOM Performed at [facility], [address], [place], [place] [address]    Special Requests   Final    NONE Performed at [facility], [address] [place], [place] [address]    Culture   Final    NO GROWTH Performed at [facility], [address] [place], [place] [address]    Report Status 10/02/2017 FINAL  Final  Culture, blood (Routine X 2) w Reflex to ID Panel     Status: None (Preliminary result)   Collection Time: 10/01/17 12:05 AM  Result Value Ref Range Status   Specimen Description   Final    BLOOD BLOOD RIGHT FOREARM Performed at [facility], [address], [place], [place] [address]    Special Requests   Final    BOTTLES DRAWN AEROBIC ONLY Blood Culture adequate volume Performed at [facility], [address], [place], [place] [address]    Culture   Final    NO GROWTH 1 DAY Performed at [facility], [address], [place], [place] [address]    Report Status PENDING  Incomplete  Culture, blood (Routine X 2) w Reflex to ID Panel     Status: None (Preliminary result)   Collection Time: 10/01/17 12:08 AM  Result Value Ref Range Status   Specimen Description   Final    BLOOD LEFT HAND Performed at [facility], [address], [place], [place]  [address]    Special Requests   Final    IN PEDIATRIC BOTTLE Blood Culture adequate volume Performed at [facility], [address], [place], [place] [address]    Culture   Final    NO GROWTH 1 DAY Performed at [facility], [address] [place], [place] [address]    Report Status PENDING  Incomplete    Radiology Studies: Ct Head Wo Contrast  Result Date: 09/30/2017 CLINICAL DATA:  Altered level of consciousness for 1 week. EXAM: CT HEAD WITHOUT CONTRAST TECHNIQUE: Contiguous axial images were obtained from the base of the skull through the vertex without intravenous contrast. COMPARISON:  12/18/2016 FINDINGS: Brain: No evidence of acute infarction, hemorrhage, hydrocephalus, extra-axial collection or mass lesion/mass effect. There is ventricular and sulcal enlargement reflecting mild atrophy. Patchy white matter hypoattenuation is seen bilaterally consistent with moderate to advanced chronic microvascular ischemic change. There are few small deep white matter lacune infarcts, chronic. Vascular: No hyperdense vessel or unexpected calcification. Skull: Normal. Negative for fracture or focal lesion. Sinuses/Orbits: Visualize globes and orbits are unremarkable. Visualized sinuses and mastoid air cells are clear. Other: None. IMPRESSION: 1. No acute intracranial abnormalities. 2. 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 Electronically Signed   By: Amie Portland M.D.   On: 09/30/2017 21:44   Mr Brain Wo Contrast  Result Date: 10/01/2017 CLINICAL DATA:  Altered level of consciousness. Lethargy beginning yesterday. Evaluate for CVA EXAM: MRI HEAD WITHOUT CONTRAST TECHNIQUE: Multiplanar, multiecho pulse sequences of the brain and surrounding structures were obtained without intravenous contrast. COMPARISON:  Head CT from yesterday FINDINGS: Brain: Extensive FLAIR hyperintensity in the cerebral white matter  and pons without mass effect. There are numerous chronic lacunar infarcts in the bilateral centrum semiovale, bilateral thalamus, and left corona radiata. Prominent number of dilated perivascular spaces at the deep gray nuclei. No chronic blood products are seen. No acute infarct, hemorrhage, hydrocephalus, or collection. Vascular: Major flow voids are preserved. Skull and upper cervical  spine: No evidence of marrow lesion Sinuses/Orbits: Left cataract resection. IMPRESSION: 1. No acute finding.  Negative for acute infarct. 2. 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 Electronically Signed   By: Marnee Spring M.D.   On: 10/01/2017 11:57   Dg Chest Port 1 View  Result Date: 10/02/2017 CLINICAL DATA:  Shortness of breath. History of COPD, current smoker, sepsis. EXAM: PORTABLE CHEST 1 VIEW COMPARISON:  Portable chest x-ray of Sep 30, 2017 and PA and lateral chest x-ray of Oct 08, 2007. FINDINGS: The lungs are adequately inflated. The lung markings are coarse in the infrahilar region which is unchanged. There is no pleural effusion. The heart and pulmonary vascularity are normal. The mediastinum is normal in width. The trachea is midline. There is calcification in the wall of the aortic arch. The bony thorax exhibits no acute abnormality. IMPRESSION: COPD. Bibasilar pulmonary densities which may reflect acute atelectasis or pneumonia. This is new since the study of September 08, 2007. However, chronic interstitial changes here could have developed over the intervening 10 years. A trial of appropriate antibiotics would be useful with follow-up radiographs to assess the lung parenchyma for improvement. Thoracic aortic atherosclerosis. Electronically Signed   By: David  [place] M.D.   On: 10/02/2017 10:38   Dg Chest Port 1 View  Result Date: 09/30/2017 CLINICAL DATA:  patient coming from home, wife reports patient has been mentally altered x1 week.H/o COPD, Diabetes type 1 and 2, HTN.  Smoker. EXAM: PORTABLE CHEST 1 VIEW COMPARISON:  09/08/2007 FINDINGS: Cardiac silhouette is normal in size. No mediastinal or hilar masses. Lungs are hyperexpanded. There is opacity at both lung bases which is new from the prior exam. Remainder of the lungs is clear. No convincing pleural effusion.  No pneumothorax. Skeletal structures are grossly intact. IMPRESSION: 1. Bilateral lung base opacity. This may reflect scarring or interstitial thickening that has developed since the prior study. Bibasilar pneumonia should be considered if there are consistent clinical findings. 2. Hyperexpanded lungs consistent with underlying COPD. Electronically Signed   By: Amie Portland M.D.   On: 09/30/2017 21:45   Scheduled Meds: . busPIRone  30 mg Oral BID  . DULoxetine  60 mg Oral Daily  . enoxaparin (LOVENOX) injection  40 mg Subcutaneous Q24H  . insulin aspart  0-9 Units Subcutaneous TID WC  . metoprolol succinate  12.5 mg Oral Daily  . nicotine  21 mg Transdermal Daily   Continuous Infusions: . sodium chloride 1,000 mL (10/02/17 0907)  . azithromycin Stopped (10/01/17 2117)  . cefTRIAXone (ROCEPHIN)  IV Stopped (10/01/17 2223)    LOS: 1 day   Merlene Laughter, DO [facility] Pager [phone]  If 7PM-7AM, please contact night-coverage [email or web address] Password [facility] 10/02/2017, 4:56 PM

## 2017-10-02 NOTE — Progress Notes (Signed)
OT Cancellation Note  Patient Details Name: Jeremy Gill MRN: [id number] DOB: 1945-08-17   Cancelled Treatment:    Reason Eval/Treat Not Completed: Patient at procedure or test/ unavailable. Will check back.  Jannely Henthorn 10/02/2017, 11:24 AM  Marica Otter, OTR/L [phone] 10/02/2017

## 2017-10-02 NOTE — Progress Notes (Signed)
VASCULAR LAB PRELIMINARY  ARTERIAL  ABI completed: Patient was in severe back pain, could not keep still, trembling during the exam with insoluble movement.    RIGHT    LEFT    PRESSURE WAVEFORM  PRESSURE WAVEFORM  BRACHIAL 176 triphasic BRACHIAL 178 triphasic  DP 194 momophasic DP 192 biphasic  AT   AT    PT 134 triphasic PT 182 biphasic  PER   PER    GREAT TOE 87 normal GREAT TOE 92 normal    RIGHT LEFT  ABI 1.09 1.08     HONGYING  Latiana Tomei, RVT 10/02/2017, 12:12 PM

## 2017-10-02 NOTE — Progress Notes (Signed)
PT Cancellation Note  Patient Details Name: Jeremy Gill MRN: [id number] DOB: Nov 18, 1945   Cancelled Treatment:    Reason Eval/Treat Not Completed: Patient at procedure or test/unavailable   Rada Hay 10/02/2017, 11:25 AM Blanchard Kelch PT [phone]

## 2017-10-02 NOTE — Evaluation (Signed)
Clinical/Bedside Swallow Evaluation Patient Details  Name: Jeremy Gill MRN: [id number] Date of Birth: 10-08-1945  Today's Date: 10/02/2017 Time: SLP Start Time (ACUTE ONLY): 1000 SLP Stop Time (ACUTE ONLY): 1025 SLP Time Calculation (min) (ACUTE ONLY): 25 min  Past Medical History:  Past Medical History:  Diagnosis Date  . ANKLE SPRAIN 12/16/2006   Qualifier: Diagnosis of  By: Tawanna Cooler MD, Eugenio Hoes   . ANXIETY 10/25/2006  . CELLULITIS, LEG, RIGHT 09/08/2007   Qualifier: Diagnosis of  By: Tawanna Cooler MD, Eugenio Hoes   . Chronic pain disorder 11/30/2015  . COPD 10/25/2006  . 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 DIABETES MELLITUS, TYPE I 10/25/2006  . Diabetic polyneuropathy (HCC) 04/08/2007   Qualifier: Diagnosis of  By: Tawanna Cooler MD, Eugenio Hoes   . 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 Essential hypertension 10/25/2006   Qualifier: Diagnosis of  By: Rosette Reveal RN, Jorene Minors   . Generalized osteoarthritis of multiple sites 11/30/2015  . HYPERTENSION 10/25/2006  . 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 Low back pain with radiation 01/23/2016  . Polyneuropathy in diabetes(357.2) 04/08/2007  . Psoriasis   . TOBACCO ABUSE 07/08/2007  . VENOUS INSUFFICIENCY 11/25/2007   Past Surgical History:  Past Surgical History:  Procedure Laterality Date  . TESTICLE REMOVAL     R testicle   HPI:  pt is a 72 yo male adm to [facility] with respiratory deficits - PMH + for bilateral scarring/lung opacity - PMH + for tobacco use, HTN, DM, depression, COPD.  Pt with CAP vs aspiraiton pna.  Pt does have h/o chronic back pain and reports he takes pain medications at home for it.  He also has poor dentition.     Assessment / Plan / Recommendation Clinical Impression  Pt presents with functional oropharyngeal swallow ability based on clinical swallow evaluation.  NO indications of airway  compromise with intake.  He does have left facial asymmetry but states this is normal and denies neuro or bell's palsy hx.  Pt did demonstrate mild anterior labial spill with liquids via cup, straw use preventative.  Given pt with poor dentition and use of pain medications = ? if this could contribute to his possible pna.  Pt states he has problems swallowing pills as he has to roll it around in his mouth - advised to take with puree if problematic.  Also recommended pt keep appointment with dentist and monitor for possible reflux symptoms *which he denies.   SLP Visit Diagnosis: Dysphagia, unspecified (R13.10)    Aspiration Risk  Mild aspiration risk    Diet Recommendation Regular;Thin liquid   Liquid Administration via: Cup;Straw Medication Administration: Whole meds with liquid Supervision: Patient able to self feed Compensations: Slow rate;Small sips/bites;Other (Comment)(start meals with liquids) Postural Changes: Remain upright for at least 30 minutes after po intake;Seated upright at 90 degrees    Other  Recommendations Oral Care Recommendations: Oral care BID   Follow up Recommendations None      Frequency and Duration            Prognosis        Swallow Study   General Date of Onset: 10/02/17 HPI: pt is a 72 yo male adm to [facility] with respiratory deficits - PMH + for bilateral scarring/lung opacity - PMH + for tobacco use, HTN, DM, depression, COPD.  Pt with  CAP vs aspiraiton pna.  Pt does have h/o chronic back pain and reports he takes pain medications at home for it.  He also has poor dentition.   Type of Study: Bedside Swallow Evaluation Diet Prior to this Study: Regular;NPO Temperature Spikes Noted: No Respiratory Status: Room air History of Recent Intubation: No Behavior/Cognition: Alert;Cooperative;Pleasant mood Oral Cavity Assessment: Dry Oral Care Completed by SLP: No Oral Cavity - Dentition: Poor condition Vision: Functional for self-feeding Self-Feeding  Abilities: Able to feed self Patient Positioning: Upright in bed Baseline Vocal Quality: Normal Volitional Cough: Strong Volitional Swallow: Able to elicit    Oral/Motor/Sensory Function Overall Oral Motor/Sensory Function: (? mild left facial asymmetry - pt reports to be baseline)   Ice Chips Ice chips: Not tested   Thin Liquid Thin Liquid: Impaired Presentation: Cup;Straw Oral Phase Functional Implications: Other (comment)(anterior spill, use of straw helpful) Other Comments: anterior labial spillage = pt reports this occurs at times infrequently    Nectar Thick Nectar Thick Liquid: Not tested   Honey Thick     Puree Puree: Within functional limits Presentation: Self Fed;Spoon   Solid   GO   Solid: Within functional limits Presentation: Self Orvan July 10/02/2017,11:32 AM   Donavan Burnet, MS [facility] SLP [phone]

## 2017-10-02 NOTE — Consult Note (Signed)
WOC Nurse wound consult note Reason for Consult: Right inner heel wound Wound type: Unclear etiology POA: Yes Measurement: 3 cm x 1.8 cm x 0.2 cm Wound bed:60-70% pink, clean granulation tissue.  Remainder presents as thin layer of yellow slough Drainage (amount, consistency, odor) Minimal red drainage on existing foam dressing Periwound: Intact, normal color, dry & flaking skin Dressing procedure/placement/frequency: Patient is hesitant to continue the Santyl he has been using at home that was prescribed by Dr. Logan Bores.  I presented an alternative to him in the form of a [employer] hydrocolloid that can be applied and left in place for 5 days and will facilitate autolytic debridement.  He opted for this approach. Monitor the wound area(s) for worsening of condition such as: Signs/symptoms of infection,  Increase in size,  Development of or worsening of odor, Development of pain, or increased pain at the affected locations.  Notify the medical team if any of these develop.  Thank you for the consult.  Discussed plan of care with the patient and bedside nurse.  WOC nurse will not follow at this time.  Please re-consult the WOC team if needed.  Helmut Muster, RN, MSN, CWOCN, CNS-BC, pager [phone]

## 2017-10-03 LAB — COMPREHENSIVE METABOLIC PANEL
ALK PHOS: 36 U/L — AB (ref 38–126)
ALT: 32 U/L (ref 17–63)
ANION GAP: 17 — AB (ref 5–15)
AST: 30 U/L (ref 15–41)
Albumin: 3.1 g/dL — ABNORMAL LOW (ref 3.5–5.0)
BILIRUBIN TOTAL: 0.9 mg/dL (ref 0.3–1.2)
BUN: 15 mg/dL (ref 6–20)
CALCIUM: 9 mg/dL (ref 8.9–10.3)
CO2: 21 mmol/L — AB (ref 22–32)
Chloride: 101 mmol/L (ref 101–111)
Creatinine, Ser: 0.94 mg/dL (ref 0.61–1.24)
Glucose, Bld: 258 mg/dL — ABNORMAL HIGH (ref 65–99)
Potassium: 3.4 mmol/L — ABNORMAL LOW (ref 3.5–5.1)
SODIUM: 139 mmol/L (ref 135–145)
TOTAL PROTEIN: 6.7 g/dL (ref 6.5–8.1)

## 2017-10-03 LAB — CBC WITH DIFFERENTIAL/PLATELET
Basophils Absolute: 0 10*3/uL (ref 0.0–0.1)
Basophils Relative: 0 %
Eosinophils Absolute: 0 10*3/uL (ref 0.0–0.7)
Eosinophils Relative: 0 %
HEMATOCRIT: 30.1 % — AB (ref 39.0–52.0)
HEMOGLOBIN: 10.2 g/dL — AB (ref 13.0–17.0)
LYMPHS ABS: 2.9 10*3/uL (ref 0.7–4.0)
LYMPHS PCT: 19 %
MCH: 30.1 pg (ref 26.0–34.0)
MCHC: 33.9 g/dL (ref 30.0–36.0)
MCV: 88.8 fL (ref 78.0–100.0)
MONOS PCT: 5 %
Monocytes Absolute: 0.8 10*3/uL (ref 0.1–1.0)
NEUTROS ABS: 11.9 10*3/uL — AB (ref 1.7–7.7)
NEUTROS PCT: 76 %
Platelets: 222 10*3/uL (ref 150–400)
RBC: 3.39 MIL/uL — AB (ref 4.22–5.81)
RDW: 12.9 % (ref 11.5–15.5)
WBC: 15.6 10*3/uL — ABNORMAL HIGH (ref 4.0–10.5)

## 2017-10-03 LAB — PHOSPHORUS: PHOSPHORUS: 3.6 mg/dL (ref 2.5–4.6)

## 2017-10-03 LAB — GLUCOSE, CAPILLARY
GLUCOSE-CAPILLARY: 234 mg/dL — AB (ref 65–99)
GLUCOSE-CAPILLARY: 189 mg/dL — AB (ref 65–99)
Glucose-Capillary: 240 mg/dL — ABNORMAL HIGH (ref 65–99)
Glucose-Capillary: 237 mg/dL — ABNORMAL HIGH (ref 65–99)

## 2017-10-03 LAB — PROCALCITONIN: Procalcitonin: 3.16 ng/mL

## 2017-10-03 LAB — MAGNESIUM: Magnesium: 1.1 mg/dL — ABNORMAL LOW (ref 1.7–2.4)

## 2017-10-03 MED ORDER — IPRATROPIUM-ALBUTEROL 0.5-2.5 (3) MG/3ML IN SOLN
3.0000 mL | Freq: Three times a day (TID) | RESPIRATORY_TRACT | Status: DC
  Administered 2017-10-03: 3 mL via RESPIRATORY_TRACT
  Filled 2017-10-03: qty 3

## 2017-10-03 MED ORDER — POTASSIUM CHLORIDE CRYS ER 20 MEQ PO TBCR
40.0000 meq | EXTENDED_RELEASE_TABLET | Freq: Two times a day (BID) | ORAL | Status: AC
  Administered 2017-10-03 (×2): 40 meq via ORAL
  Filled 2017-10-03 (×2): qty 2

## 2017-10-03 MED ORDER — MAGNESIUM SULFATE 4 GM/100ML IV SOLN
4.0000 g | Freq: Once | INTRAVENOUS | Status: AC
  Administered 2017-10-03: 4 g via INTRAVENOUS
  Filled 2017-10-03: qty 100

## 2017-10-03 MED ORDER — IPRATROPIUM-ALBUTEROL 0.5-2.5 (3) MG/3ML IN SOLN
3.0000 mL | Freq: Two times a day (BID) | RESPIRATORY_TRACT | Status: DC
  Administered 2017-10-03 – 2017-10-04 (×2): 3 mL via RESPIRATORY_TRACT
  Filled 2017-10-03 (×2): qty 3

## 2017-10-03 MED ORDER — INSULIN ASPART 100 UNIT/ML ~~LOC~~ SOLN: 0.0000 [IU] | Freq: Every day | SUBCUTANEOUS | Status: DC

## 2017-10-03 MED ORDER — GABAPENTIN 300 MG PO CAPS
300.0000 mg | ORAL_CAPSULE | Freq: Three times a day (TID) | ORAL | Status: DC
  Administered 2017-10-03 (×3): 300 mg via ORAL
  Filled 2017-10-03 (×3): qty 1

## 2017-10-03 MED ORDER — INSULIN GLARGINE 100 UNIT/ML ~~LOC~~ SOLN
20.0000 [IU] | Freq: Every day | SUBCUTANEOUS | Status: DC
  Administered 2017-10-03 – 2017-10-04 (×2): 20 [IU] via SUBCUTANEOUS
  Filled 2017-10-03 (×2): qty 0.2

## 2017-10-03 NOTE — Evaluation (Signed)
Occupational Therapy Evaluation Patient Details Name: Jeremy Gill MRN: [id number] DOB: 05-25-1946 Today's Date: 10/03/2017    History of Present Illness 72 year old man admitted for acute metabolic encephalopathy.  PMH:  DM, HTN, COPD, chronic back pain and neuropathy   Clinical Impression   This 72 year old man was admitted for the above.  He is independent to mod I at home at baseline.  He currently needs min guard assist for safety.  Wife present and feels they can manage at home.  He had been in bed for several days; feel he will be back to baseline soon    Follow Up Recommendations  Supervision/Assistance - 24 hour    Equipment Recommendations  None recommended by OT    Recommendations for Other Services       Precautions / Restrictions Precautions Precautions: Fall Restrictions Weight Bearing Restrictions: No      Mobility Bed Mobility Overal bed mobility: Needs Assistance Bed Mobility: Rolling;Sidelying to Sit Rolling: Min guard Sidelying to sit: Min assist       General bed mobility comments: for trunk  Transfers Overall transfer level: Needs assistance Equipment used: Rolling walker (2 wheeled) Transfers: Sit to/from Stand Sit to Stand: Min guard         General transfer comment: for safety    Balance                                           ADL either performed or assessed with clinical judgement   ADL Overall ADL's : Needs assistance/impaired Eating/Feeding: Independent   Grooming: Set up;Sitting;Oral care   Upper Body Bathing: Set up;Sitting   Lower Body Bathing: Min guard;Sit to/from stand   Upper Body Dressing : Set up;Sitting   Lower Body Dressing: Min guard;Sit to/from stand   Toilet Transfer: Min guard;Ambulation             General ADL Comments: pt a little unsteady; had been in bed. Wife feels like they can manage at home     Vision         Perception     Praxis      Pertinent Vitals/Pain  Pain Assessment: No/denies pain     Hand Dominance     Extremity/Trunk Assessment Upper Extremity Assessment Upper Extremity Assessment: Generalized weakness           Communication Communication Communication: (? HOH)   Cognition Arousal/Alertness: Awake/alert Behavior During Therapy: WFL for tasks assessed/performed                                   General Comments: answered one question incorrectly. When reasked, he answered correctly.  ? if he heard me   General Comments       Exercises     Shoulder Instructions      Home Living Family/patient expects to be discharged to:: Private residence Living Arrangements: Spouse/significant other Available Help at Discharge: Family               Bathroom Shower/Tub: Walk-in Human resources officer: Standard     Home Equipment: Cane - single point   Additional Comments: only uses cane sometimes.  Wife's mother lives with them      Prior Functioning/Environment Level of Independence: Independent with assistive device(s)  OT Problem List:        OT Treatment/Interventions:      OT Goals(Current goals can be found in the care plan section) Acute Rehab OT Goals Patient Stated Goal: home OT Goal Formulation: All assessment and education complete, DC therapy  OT Frequency:     Barriers to D/C:            Co-evaluation              AM-PAC PT "6 Clicks" Daily Activity     Outcome Measure Help from another person eating meals?: None Help from another person taking care of personal grooming?: A Little Help from another person toileting, which includes using toliet, bedpan, or urinal?: A Little Help from another person bathing (including washing, rinsing, drying)?: A Little Help from another person to put on and taking off regular upper body clothing?: A Little Help from another person to put on and taking off regular lower body clothing?: A Little 6 Click Score:  19   End of Session    Activity Tolerance: Patient tolerated treatment well Patient left: in chair;with call bell/phone within reach;with chair alarm set;with family/visitor present  OT Visit Diagnosis: Muscle weakness (generalized) (M62.81)                Time: 0454-0981 OT Time Calculation (min): 29 min Charges:  OT General Charges $OT Visit: 1 Visit OT Evaluation $OT Eval Low Complexity: 1 Low G-Codes:     [place], OTR/L [phone] 10/03/2017  Lavontay Kirk 10/03/2017, 3:42 PM

## 2017-10-03 NOTE — Progress Notes (Signed)
Inpatient Diabetes Program Recommendations  AACE/ADA: New Consensus Statement on Inpatient Glycemic Control (2015)  Target Ranges:  Prepandial:   less than 140 mg/dL      Peak postprandial:   less than 180 mg/dL (1-2 hours)      Critically ill patients:  140 - 180 mg/dL   Lab Results  Component Value Date   GLUCAP 234 (H) 10/03/2017   HGBA1C 10.0 08/20/2017    Review of Glycemic Control  Diabetes history: DM 2 Outpatient Diabetes medications: Lantus 45 units, Victoza 0.6 mg Daily, Metformin 1000 mg BID Current orders for Inpatient glycemic control: Novolog Sensitive Correction 0-9 units tid  Inpatient Diabetes Program Recommendations:    Glucose consistently in the 200's after multiple hypoglycemia episodes after Lantus 30 units given. Consider Lantus 12 units (0.15 units/kg). Consider adding Novolog HS scale 0-5 units.  Thanks,  Christena Deem RN, MSN, BC-ADM, [facility] Inpatient Diabetes Coordinator Team Pager [phone] (8a-5p)

## 2017-10-03 NOTE — Evaluation (Signed)
Physical Therapy Evaluation Patient Details Name: Jeremy Gill MRN: [id number] DOB: 1946/05/01 Today's Date: 10/03/2017   History of Present Illness  72 year old man admitted for acute metabolic encephalopathy, sepsis.  PMH:  DM, HTN, COPD, chronic back pain and neuropathy, pressure ulcer R heel    Clinical Impression  On eval, pt required Min guard assist for mobility. He walked ~60 feet with a RW. Some confusion noted during session. He tolerated session well. Discussed d/c plan with pt's wife-she feels she can manage pt's care at home. She politely declines HHPT f/u. Will follow and progress activity as tolerated.     Follow Up Recommendations No PT follow up;Supervision/Assistance - 24 hour (wife politely declines HH f/u)    Equipment Recommendations  None recommended by PT    Recommendations for Other Services       Precautions / Restrictions Precautions Precautions: Fall Restrictions Weight Bearing Restrictions: No      Mobility  Bed Mobility Overal bed mobility: Needs Assistance Bed Mobility: Supine to Sit Rolling: Min guard Sidelying to sit: Min assist       General bed mobility comments: for trunk and LEs. cues for safety, technique.   Transfers Overall transfer level: Needs assistance Equipment used: Rolling walker (2 wheeled) Transfers: Sit to/from Stand Sit to Stand: Min guard         General transfer comment: close guard for safety. VCs safety, hand placement.   Ambulation/Gait Ambulation/Gait assistance: Min guard Ambulation Distance (Feet): 60 Feet Assistive device: Rolling walker (2 wheeled) Gait Pattern/deviations: Step-through pattern;Decreased stride length     General Gait Details: close guard for safety. mildly unsteady.   Stairs            Wheelchair Mobility    Modified Rankin (Stroke Patients Only)       Balance Overall balance assessment: Needs assistance         Standing balance support: Bilateral upper  extremity supported Standing balance-Leahy Scale: Poor                               Pertinent Vitals/Pain Pain Assessment: No/denies pain    Home Living Family/patient expects to be discharged to:: Private residence Living Arrangements: Spouse/significant other Available Help at Discharge: Family           Home Equipment: Gilmer Mor - single point Additional Comments: only uses cane sometimes.  Wife's mother lives with them    Prior Function Level of Independence: Independent with assistive device(s)               Hand Dominance        Extremity/Trunk Assessment   Upper Extremity Assessment Upper Extremity Assessment: Defer to OT evaluation    Lower Extremity Assessment Lower Extremity Assessment: Generalized weakness    Cervical / Trunk Assessment Cervical / Trunk Assessment: Kyphotic  Communication   Communication: (? HOH)  Cognition Arousal/Alertness: Awake/alert Behavior During Therapy: WFL for tasks assessed/performed                                   General Comments: answered one question incorrectly. When reasked, he answered correctly.       General Comments      Exercises     Assessment/Plan    PT Assessment Patient needs continued PT services  PT Problem List Decreased balance;Decreased mobility;Decreased cognition  PT Treatment Interventions Gait training;DME instruction;Functional mobility training;Balance training;Patient/family education;Therapeutic exercise;Therapeutic activities    PT Goals (Current goals can be found in the Care Plan section)  Acute Rehab PT Goals Patient Stated Goal: home PT Goal Formulation: With patient/family Time For Goal Achievement: 10/17/17 Potential to Achieve Goals: Good    Frequency Min 3X/week   Barriers to discharge        Co-evaluation               AM-PAC PT "6 Clicks" Daily Activity  Outcome Measure Difficulty turning over in bed (including  adjusting bedclothes, sheets and blankets)?: A Little Difficulty moving from lying on back to sitting on the side of the bed? : Unable Difficulty sitting down on and standing up from a chair with arms (e.g., wheelchair, bedside commode, etc,.)?: A Little Help needed moving to and from a bed to chair (including a wheelchair)?: A Little Help needed walking in hospital room?: A Little Help needed climbing 3-5 steps with a railing? : A Little 6 Click Score: 16    End of Session Equipment Utilized During Treatment: Gait belt Activity Tolerance: Patient tolerated treatment well Patient left: in chair;with call bell/phone within reach;with chair alarm set;with family/visitor present   PT Visit Diagnosis: Muscle weakness (generalized) (M62.81);Difficulty in walking, not elsewhere classified (R26.2)    Time: 1610-9604 PT Time Calculation (min) (ACUTE ONLY): 30 min   Charges:   PT Evaluation $PT Eval Moderate Complexity: 1 Mod     PT G Codes:         Rebeca Alert, MPT Pager: [phone]

## 2017-10-03 NOTE — Plan of Care (Signed)

## 2017-10-03 NOTE — Progress Notes (Signed)
PROGRESS NOTE    Jeremy Gill  MVH:[id number] DOB: [birth year] DOA: 09/30/2017 PCP: [place], [name], MD   Brief Narrative:  Jeremy Gill a 72 y.o.malewith medical history significant for hypertension, hyperlipidemia, diabetes mellitus, depression, anxiety, tobacco abuse, psoriasis, CKD 3, chronic back pain, presents with altered mental status, noted to be more lethargic and sleepy. As per wife, pt got up early in the am PTA in ED, took his meds, which he never did as he usually gets confused about his meds (wife usually gives his meds). Pt has severe chronic back pain for which he takes tramadol and baclofen. Wife unsure if he OD.  Denied any other symptoms.  In the ED, pt is barelyarousable, not oriented x3, moves all extremities slightly upon painful stimuli, pinpoint pupils. Noted to have leukocytosis, chest x-ray showed bilateral basilar opacity, CT head negative.  Patient admitted for further management of bilateral community-acquired pneumonia with suspected possible aspiration.  Assessment & Plan:   Principal Problem:   Acute metabolic encephalopathy Active Problems:   Diabetes mellitus type 2 with neurological manifestations (HCC)   TOBACCO ABUSE   Essential hypertension   COPD (chronic obstructive pulmonary disease) (HCC)   Depression   Acute renal failure superimposed on stage 3 chronic kidney disease (HCC)   Sepsis (HCC)   Hypokalemia   Pressure injury of skin  Sepsis likely 2/2 Suspected CAP Vs Aspiration pneumonia -Afebrile and Leukocytosis is improving  -LA elevated, currently down trended to 1.3 -Procalcitonin elevated at 5.68 and trending down to 3.16 -Blood Culture x2 showed NGTD at 2 Days -UA negative, Urine Cx showed No Growth  -Urine Strep Pneumo and L. Pneumophila Serogp 1 Ur Ag negative -Admission CXR read "Bilateral lung base opacity. This may reflect scarring or interstitial thickening that has developed since the prior study. Bibasilar pneumonia  should be considered if there are consistent clinical findings. Hyperexpanded lungs consistent with underlying COPD." -CXR yesterday AM read as "COPD. Bibasilar pulmonary densities which may reflect acute atelectasis or pneumonia. This is new since the study of April 13,2009. However, chronic interstitial changes here could have developed over the intervening 10 years. A trial of appropriate antibiotics would be useful with follow-up radiographs to assess the lung parenchyma for improvement. Thoracic aortic atherosclerosis."  -Continue IV Abx with IV Ceftriaxone and IV Azithromycin for now  -WBCs went from 22.8 and is now 15.6 -C/w DuoNeb Breathing Treatments (now BID) and continue PRN Albuterol -SLP evaluation done and patient ok for Regular Diet Thin Liquids  -Continue IVF rehydration as below -Add Incentive Spirometry, Flutter Valve and Mucinex   Acute Metabolic Encephalopathy, improving but ? Some component of Delirium  -Improving. Per Wife not at baseline but is improved -Likely was narcotics OD induced Vs ??CAP -Mental status improved slightly after Narcan x2 was given -UDS negative -CT head negative for acute Intracranial Abnormality -MRI head negative for any acute changes -Monitor closely -Delirium Precautions  AKI on CKD-II -Improved, status post fluids; Given 500 mL bolus and then started on Maintenance IVF at 75 mL/hr; IVF to be D/C'd Today  -Baseline Cr is1.0-1.2., pt's Creatine was1.81 on admission -BUN/Creatinine is now 15/0.94 -Avoid Nephrotoxic Medications if possible -Repeat CMP in AM   DM type 2 with Hypoglycemia -Had episodes of hypoglycemic episodes (has been NPO Vs ?OD on meds) -Last A1c10.0 on 08/20/17, poorly controlled -C/w Sensitive Novolog SSI AC and will add HS Coverage today -Held Home Lantus, Victoza, Metformin; -Resumed Lantus at 20 units Daily for now and will  evaluate  -CBGs ranging from 231-273  -We will consult diabetes education coordinator for  further assistance management  Stage 3 pressure sore to R heel -??PVD -Pt scheduled to have arterial doppler as an outpt 10/02/17, ordered for inpatient and done today however was unable to tolerate due to pain -ABIs as below and normal  -WOC Nurse Consult appreciated   Essential Hypertension -Continue Metoprolol Succinate 12.5 mg p.o. daily -C/w IV Hydralazine 10 mg IV every 6 as needed for high blood pressure with systolic blood pressure greater than 175 as needed  Chronic Severe Back Pain -Due to possibility of Overdosing, held home Gabapentin, Tramadol, and Baclofen -We will resume home Tramadol 50 mg every 6 as needed -Resumed Home Gapabentin at 300 mg po TID -Will continue to Hold Baclofen at this time  Depression/Anxiety  -C/w Home Duloxetine 60 mg po Daily and Buspirone 30 mg po BID -We will resume home Amitriptyline once improved  Tobacco Abuse -Smoking Cessation Counseling given -C/w Nicotine Patch 21 mg TD  Normocytic Anemia -Hb/Hct stable at 10.2/30.1 -Continue to Monitor for S/Sx of Bleeding -Repeat CBC in AM   Hypomagnesemia  -Pateint's this morning was 1.1 -Replete with IV mag sulfate 4 g -Continue to monitor and replete as necessary -Repeat magnesium level in the a.m.  Hypokalemia  -Patient's potassium level this morning was 3.4 -Replete with p.o. potassium chloride 40 mEq BID x2 doses -Continue to monitor and replete as necessary -Repeat CMP in a.m.  DVT prophylaxis: Enoxaparin 40 mg sq q24h Code Status: FULL CODE Family Communication: No family present at bedside  Disposition Plan: Remain Inpatient currently and anticipate D/C in next 24-48 hours as Patient and Wife Refuse HHPT  Consultants:   None   Procedures:  ABI ABI completed: Patient was in severe back pain, could not keep still, trembling during the exam with insoluble movement.    RIGHT    LEFT    PRESSURE WAVEFORM  PRESSURE WAVEFORM  BRACHIAL 176 triphasic BRACHIAL  178 triphasic  DP 194 momophasic DP 192 biphasic  AT   AT    PT 134 triphasic PT 182 biphasic  PER   PER    GREAT TOE 87 normal GREAT TOE 92 normal    RIGHT LEFT  ABI 1.09 1.08     Antimicrobials:  Anti-infectives (From admission, onward)   Start     Dose/Rate Route Frequency Ordered Stop   10/01/17 2300  cefTRIAXone (ROCEPHIN) 1 g in sodium chloride 0.9 % 100 mL IVPB     1 g 200 mL/hr over 30 Minutes Intravenous Every 24 hours 10/01/17 0134     10/01/17 2200  azithromycin (ZITHROMAX) 500 mg in sodium chloride 0.9 % 250 mL IVPB     500 mg 250 mL/hr over 60 Minutes Intravenous Every 24 hours 10/01/17 0134     10/01/17 0015  azithromycin (ZITHROMAX) 500 mg in sodium chloride 0.9 % 250 mL IVPB     500 mg 250 mL/hr over 60 Minutes Intravenous  Once 10/01/17 0002 10/01/17 0229   09/30/17 2330  cefTRIAXone (ROCEPHIN) 1 g in sodium chloride 0.9 % 100 mL IVPB     1 g 200 mL/hr over 30 Minutes Intravenous  Once 09/30/17 2321 10/01/17 0048   09/30/17 2330  azithromycin (ZITHROMAX) tablet 500 mg  Status:  Discontinued     500 mg Oral  Once 09/30/17 2321 10/01/17 0002     Subjective: Seen and examined at bedside and still a little confused but was more alert today.  He was able to tell me the month and the year however did not know that he was in the hospital and still thinks he is at home.  Likely has some delirium.  Denies any chest pain, shortness breath, nausea, vomiting but did have some back pain earlier.  Feels like he is moving some.  Objective: Vitals:   10/02/17 2210 10/03/17 0525 10/03/17 0540 10/03/17 0750  BP:  (!) 142/102 (!) 158/88   Pulse:  98    Resp:  18    Temp:  98.2 F (36.8 C)    TempSrc:  Oral    SpO2: 97% 98%  96%  Weight:      Height:        Intake/Output Summary (Last 24 hours) at 10/03/2017 0759 Last data filed at 10/03/2017 0600 Gross per 24 hour  Intake 2103.75 ml  Output 2750 ml  Net -646.25 ml   Filed Weights   10/01/17 1734  Weight:  80.8 kg (178 lb 2.1 oz)   Examination: Physical Exam:  Constitutional: Well-nourished, well-developed Caucasian male who is currently in no acute distress appears calm and comfortable. Eyes: Sclera anicteric.  Lids and conjunctive are normal ENMT: External ears and nose appear normal.  Has poor dentition Neck: No JVD and neck appears supple Respiratory: Diminished to auscultation bilaterally with mild rhonchi and continues to have some slightly coarse breath sounds.  Patient has normal respiratory effort and is not tachypneic or using any accessory muscle usage Cardiovascular: Regular rate and rhythm.  No appreciable murmurs, rubs, gallops.  No extremity edema Abdomen: Soft, nontender, nondistended.  Bowel sounds present all 4 quadrants GU: Deferred Musculoskeletal: No contractures or cyanosis. Skin: Warm and dry.  Has a right heel ulcer that is covered with Mepilex. Neurologic: Cranial nerves II through XII grossly intact with no appreciable focal deficits. Psychiatric: Slightly confused but is more awake and alert and oriented x2 today.  Pleasant mood and affect.  Data Reviewed: I have personally reviewed following labs and imaging studies  CBC: Recent Labs  Lab 09/30/17 2205 10/01/17 1334 10/02/17 0514 10/03/17 0516  WBC 15.7* 22.8* 17.6* 15.6*  NEUTROABS 13.6* 18.5* 13.7* 11.9*  HGB 10.8* 10.6* 10.0* 10.2*  HCT 32.3* 32.1* 29.4* 30.1*  MCV 90.0 92.0 89.9 88.8  PLT 223 215 204 222   Basic Metabolic Panel: Recent Labs  Lab 09/30/17 2205 10/01/17 1334 10/02/17 0514 10/03/17 0516  NA 140 144 140 139  K 3.4* 4.0 3.6 3.4*  CL 102 110 103 101  CO2 26 25 24  21*  GLUCOSE 272* 103* 185* 258*  BUN 38* 25* 18 15  CREATININE 1.81* 1.17 0.88 0.94  CALCIUM 9.3 8.6* 8.7* 9.0  MG  --   --   --  1.1*  PHOS  --   --   --  3.6   GFR: Estimated Creatinine Clearance: 78 mL/min (by C-G formula based on SCr of 0.94 mg/dL). Liver Function Tests: Recent Labs  Lab 09/30/17 2205  10/03/17 0516  AST 20 30  ALT 18 32  ALKPHOS 34* 36*  BILITOT 0.6 0.9  PROT 7.0 6.7  ALBUMIN 3.6 3.1*   No results for input(s): LIPASE, AMYLASE in the last 168 hours. Recent Labs  Lab 09/30/17 2101  AMMONIA 12   Coagulation Profile: Recent Labs  Lab 10/01/17 0146  INR 1.16   Cardiac Enzymes: No results for input(s): CKTOTAL, CKMB, CKMBINDEX, TROPONINI in the last 168 hours. BNP (last 3 results) No results for input(s): PROBNP in the  last 8760 hours. HbA1C: No results for input(s): HGBA1C in the last 72 hours. CBG: Recent Labs  Lab 10/01/17 2042 10/02/17 0735 10/02/17 1221 10/02/17 1640 10/02/17 1954  GLUCAP 159* 186* 273* 231* 245*   Lipid Profile: No results for input(s): CHOL, HDL, LDLCALC, TRIG, CHOLHDL, LDLDIRECT in the last 72 hours. Thyroid Function Tests: No results for input(s): TSH, T4TOTAL, FREET4, T3FREE, THYROIDAB in the last 72 hours. Anemia Panel: No results for input(s): VITAMINB12, FOLATE, FERRITIN, TIBC, IRON, RETICCTPCT in the last 72 hours. Sepsis Labs: Recent Labs  Lab 10/01/17 0146 10/01/17 0545 10/02/17 0514 10/03/17 0516  PROCALCITON 5.68  --  5.50 3.16  LATICACIDVEN 2.0* 1.3  --   --     Recent Results (from the past 240 hour(s))  Urine Culture     Status: None   Collection Time: 09/30/17 11:47 PM  Result Value Ref Range Status   Specimen Description   Final    URINE, RANDOM Performed at [facility], [address] [place], [place] [address]    Special Requests   Final    NONE Performed at [facility], [address] [place], [place] [address]    Culture   Final    NO GROWTH Performed at [facility], [address] [place], [place] [address]    Report Status 10/02/2017 FINAL  Final  Culture, blood (Routine X 2) w Reflex to ID Panel     Status: None (Preliminary result)   Collection Time: 10/01/17 12:05 AM  Result Value Ref Range Status   Specimen Description   Final     BLOOD BLOOD RIGHT FOREARM Performed at [facility], [address], [place], [place] [address]    Special Requests   Final    BOTTLES DRAWN AEROBIC ONLY Blood Culture adequate volume Performed at [facility], [address], [place], [place] [address]    Culture   Final    NO GROWTH 1 DAY Performed at [facility], [address], [place], [place] [address]    Report Status PENDING  Incomplete  Culture, blood (Routine X 2) w Reflex to ID Panel     Status: None (Preliminary result)   Collection Time: 10/01/17 12:08 AM  Result Value Ref Range Status   Specimen Description   Final    BLOOD LEFT HAND Performed at [facility], [address] [place], [place] [address]    Special Requests   Final    IN PEDIATRIC BOTTLE Blood Culture adequate volume Performed at [facility], [address], [place], [place] [address]    Culture   Final    NO GROWTH 1 DAY Performed at [facility], [address] [place], [place] [address]    Report Status PENDING  Incomplete    Radiology Studies: Mr Brain Wo Contrast  Result Date: 10/01/2017 CLINICAL DATA:  Altered level of consciousness. Lethargy beginning yesterday. Evaluate for CVA EXAM: MRI HEAD WITHOUT CONTRAST TECHNIQUE: Multiplanar, multiecho pulse sequences of the brain and surrounding structures were obtained without intravenous contrast. COMPARISON:  Head CT from yesterday FINDINGS: Brain: Extensive FLAIR hyperintensity in the cerebral white matter and pons without mass effect. There are numerous chronic lacunar infarcts in the bilateral centrum semiovale, bilateral thalamus, and left corona radiata. Prominent number of dilated perivascular spaces at the deep gray nuclei. No chronic blood products are seen. No acute infarct, hemorrhage, hydrocephalus, or collection. Vascular: Major flow  voids are preserved. Skull and upper cervical spine: No evidence of  marrow lesion Sinuses/Orbits: Left cataract resection. IMPRESSION: 1. No acute finding.  Negative for acute infarct. 2. 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 Electronically Signed   By: Marnee Spring M.D.   On: 10/01/2017 11:57   Dg Chest Port 1 View  Result Date: 10/02/2017 CLINICAL DATA:  Shortness of breath. History of COPD, current smoker, sepsis. EXAM: PORTABLE CHEST 1 VIEW COMPARISON:  Portable chest x-ray of Sep 30, 2017 and PA and lateral chest x-ray of Oct 08, 2007. FINDINGS: The lungs are adequately inflated. The lung markings are coarse in the infrahilar region which is unchanged. There is no pleural effusion. The heart and pulmonary vascularity are normal. The mediastinum is normal in width. The trachea is midline. There is calcification in the wall of the aortic arch. The bony thorax exhibits no acute abnormality. IMPRESSION: COPD. Bibasilar pulmonary densities which may reflect acute atelectasis or pneumonia. This is new since the study of September 08, 2007. However, chronic interstitial changes here could have developed over the intervening 10 years. A trial of appropriate antibiotics would be useful with follow-up radiographs to assess the lung parenchyma for improvement. Thoracic aortic atherosclerosis. Electronically Signed   By: David  [place] M.D.   On: 10/02/2017 10:38   Scheduled Meds: . [START ON 10/04/2017] aspirin  81 mg Oral Once per day on Mon Fri  . atorvastatin  40 mg Oral Daily  . busPIRone  30 mg Oral BID  . DULoxetine  60 mg Oral Daily  . enoxaparin (LOVENOX) injection  40 mg Subcutaneous Q24H  . guaiFENesin  1,200 mg Oral BID  . insulin aspart  0-9 Units Subcutaneous TID WC  . ipratropium-albuterol  3 mL Nebulization BID  . metoprolol succinate  12.5 mg Oral Daily  . nicotine  21 mg Transdermal Daily  . potassium chloride  40 mEq Oral BID   Continuous Infusions: . sodium chloride 1,000 mL (10/02/17 0907)  . azithromycin Stopped  (10/02/17 2240)  . cefTRIAXone (ROCEPHIN)  IV Stopped (10/03/17 0245)  . magnesium sulfate 1 - 4 g bolus IVPB      LOS: 2 days   Merlene Laughter, DO [facility] Pager [phone]  If 7PM-7AM, please contact night-coverage [email or web address] Password [email or web address] 10/03/2017, 7:59 AM

## 2017-10-04 ENCOUNTER — Other Ambulatory Visit: Payer: Self-pay | Admitting: Family Medicine

## 2017-10-04 ENCOUNTER — Telehealth: Payer: Self-pay | Admitting: Family Medicine

## 2017-10-04 DIAGNOSIS — L89619 Pressure ulcer of right heel, unspecified stage: Secondary | ICD-10-CM

## 2017-10-04 DIAGNOSIS — J181 Lobar pneumonia, unspecified organism: Principal | ICD-10-CM

## 2017-10-04 DIAGNOSIS — J189 Pneumonia, unspecified organism: Secondary | ICD-10-CM

## 2017-10-04 LAB — CBC WITH DIFFERENTIAL/PLATELET
BASOS ABS: 0 10*3/uL (ref 0.0–0.1)
Basophils Relative: 0 %
Eosinophils Absolute: 0.3 10*3/uL (ref 0.0–0.7)
Eosinophils Relative: 3 %
HEMATOCRIT: 30.4 % — AB (ref 39.0–52.0)
HEMOGLOBIN: 10.2 g/dL — AB (ref 13.0–17.0)
LYMPHS PCT: 29 %
Lymphs Abs: 3.2 10*3/uL (ref 0.7–4.0)
MCH: 29.7 pg (ref 26.0–34.0)
MCHC: 33.6 g/dL (ref 30.0–36.0)
MCV: 88.6 fL (ref 78.0–100.0)
MONO ABS: 0.7 10*3/uL (ref 0.1–1.0)
Monocytes Relative: 7 %
NEUTROS ABS: 6.8 10*3/uL (ref 1.7–7.7)
NEUTROS PCT: 61 %
Platelets: 273 10*3/uL (ref 150–400)
RBC: 3.43 MIL/uL — AB (ref 4.22–5.81)
RDW: 13 % (ref 11.5–15.5)
WBC: 10.9 10*3/uL — ABNORMAL HIGH (ref 4.0–10.5)

## 2017-10-04 LAB — COMPREHENSIVE METABOLIC PANEL
ALK PHOS: 38 U/L (ref 38–126)
ALT: 26 U/L (ref 17–63)
AST: 20 U/L (ref 15–41)
Albumin: 3 g/dL — ABNORMAL LOW (ref 3.5–5.0)
Anion gap: 11 (ref 5–15)
BILIRUBIN TOTAL: 0.8 mg/dL (ref 0.3–1.2)
BUN: 13 mg/dL (ref 6–20)
CO2: 24 mmol/L (ref 22–32)
CREATININE: 0.83 mg/dL (ref 0.61–1.24)
Calcium: 8.6 mg/dL — ABNORMAL LOW (ref 8.9–10.3)
Chloride: 106 mmol/L (ref 101–111)
GFR calc Af Amer: >60 mL/min (ref >60)
Glucose, Bld: 168 mg/dL — ABNORMAL HIGH (ref 65–99)
Potassium: 3.5 mmol/L (ref 3.5–5.1)
Sodium: 141 mmol/L (ref 135–145)
TOTAL PROTEIN: 6.4 g/dL — AB (ref 6.5–8.1)

## 2017-10-04 LAB — MAGNESIUM: MAGNESIUM: 1.6 mg/dL — AB (ref 1.7–2.4)

## 2017-10-04 LAB — GLUCOSE, CAPILLARY
GLUCOSE-CAPILLARY: 146 mg/dL — AB (ref 65–99)
Glucose-Capillary: 188 mg/dL — ABNORMAL HIGH (ref 65–99)

## 2017-10-04 LAB — PHOSPHORUS: Phosphorus: 3.3 mg/dL (ref 2.5–4.6)

## 2017-10-04 MED ORDER — IPRATROPIUM-ALBUTEROL 0.5-2.5 (3) MG/3ML IN SOLN: 3.0000 mL | Freq: Four times a day (QID) | RESPIRATORY_TRACT | 0 refills | Status: DC | PRN

## 2017-10-04 MED ORDER — AZITHROMYCIN 250 MG PO TABS: 500.0000 mg | ORAL_TABLET | Freq: Every day | ORAL | 0 refills | Status: AC

## 2017-10-04 MED ORDER — GUAIFENESIN ER 600 MG PO TB12: 1200.0000 mg | ORAL_TABLET | Freq: Two times a day (BID) | ORAL | 0 refills | Status: DC

## 2017-10-04 MED ORDER — CEFDINIR 300 MG PO CAPS: 300.0000 mg | ORAL_CAPSULE | Freq: Two times a day (BID) | ORAL | 0 refills | Status: DC

## 2017-10-04 MED ORDER — CEFDINIR 300 MG PO CAPS: 300.0000 mg | ORAL_CAPSULE | Freq: Two times a day (BID) | ORAL | Status: DC

## 2017-10-04 MED ORDER — MAGNESIUM SULFATE 2 GM/50ML IV SOLN
2.0000 g | Freq: Once | INTRAVENOUS | Status: AC
  Administered 2017-10-04: 2 g via INTRAVENOUS
  Filled 2017-10-04: qty 50

## 2017-10-04 MED ORDER — NICOTINE 21 MG/24HR TD PT24
21.0000 mg | MEDICATED_PATCH | Freq: Every day | TRANSDERMAL | 0 refills | Status: DC
Start: 2017-10-05 — End: 2017-11-01

## 2017-10-04 MED ORDER — GABAPENTIN 300 MG PO CAPS
600.0000 mg | ORAL_CAPSULE | Freq: Three times a day (TID) | ORAL | Status: DC
  Administered 2017-10-04: 600 mg via ORAL
  Filled 2017-10-04: qty 2

## 2017-10-04 MED ORDER — GABAPENTIN 600 MG PO TABS: 600.0000 mg | ORAL_TABLET | Freq: Three times a day (TID) | ORAL | 0 refills | Status: DC

## 2017-10-04 MED ORDER — INSULIN GLARGINE 100 UNIT/ML SOLOSTAR PEN: 20.0000 [IU] | PEN_INJECTOR | Freq: Every day | SUBCUTANEOUS | 11 refills | Status: DC

## 2017-10-04 MED ORDER — BACLOFEN 10 MG PO TABS
10.0000 mg | ORAL_TABLET | Freq: Three times a day (TID) | ORAL | Status: DC
  Administered 2017-10-04: 10 mg via ORAL
  Filled 2017-10-04: qty 1

## 2017-10-04 MED ORDER — AZITHROMYCIN 250 MG PO TABS: 500.0000 mg | ORAL_TABLET | Freq: Every day | ORAL | Status: DC

## 2017-10-04 NOTE — Discharge Summary (Signed)
Physician Discharge Summary  Jeremy Gill:[id number] DOB: 1945/11/27 DOA: 09/30/2017  PCP: [place], [name], MD  Admit date: 09/30/2017 Discharge date: 10/04/2017  Admitted From: Home Disposition: Home (Declined Home Health PT)  Recommendations for Outpatient Follow-up:  1. Follow up with PCP in 1-2 weeks 2. Please obtain CMP/CBC, Mag, Phos in one week 3. Please follow up on the following pending results:  Home Health: No  Equipment/Devices: DME Nebulizer   Discharge Condition: Stable CODE STATUS: FULL CODE Diet recommendation: Heart Healthy Carb Modified Diet  Brief/Interim Summary: Jeremy Brannan Bullardis a 72 y.o.malewith medical history significantforHypertension, Hyperlipidemia, Diabetes Mellitus, Depression, Anxiety, Tobacco abuse, Psoriasis, CKD 3, chronic back pain and other comorbids who presented with altered mental status, and noted to be more lethargic and sleepy. As per wife, pt got up early in the am PTA in ED, took his meds, which he never did as he usually gets confused about his meds (wife usually gives his meds). Pt has severe chronic back pain for which he takes tramadol and baclofen. Wife unsure if he OD.Denied any other symptoms. In theED, pt was barelyarousable, not oriented x3,moves all extremities slightly upon painful stimuli,pinpoint pupils. Notedto have leukocytosis,chest x-ray showed bilateral basilar opacity, CT head negative. Patient admitted for further management of bilateral community-acquired pneumonia with suspected possible aspiration.  He was placed on IV antibiotics as well as breathing treatments and subsequently improved.  His encephalopathy also improved and was awake and alert and oriented x3 today.  Because of his improvement he was deemed medically stable to be discharged and his antibiotics were transitioned to p.o.  He will be discharged home at this time and will need to follow-up with PCP within 1 to 2 weeks and have repeat chest x-ray  in 3 to 6 weeks.  Discharge Diagnoses:  Principal Problem:   Acute metabolic encephalopathy Active Problems:   Diabetes mellitus type 2 with neurological manifestations (HCC)   TOBACCO ABUSE   Essential hypertension   COPD (chronic obstructive pulmonary disease) (HCC)   Depression   Acute renal failure superimposed on stage 3 chronic kidney disease (HCC)   Sepsis (HCC)   Hypokalemia   Pressure injury of skin  Sepsis likely 2/2Suspected CAP -Afebrile and Leukocytosis is improving  -LA elevated,currently down trended to 1.3 -Procalcitonin elevated at 5.68 and trending down to 3.16 -Blood Culture x2 showed NGTD at 3 Days -UA negative, Urine Cx showed No Growth  -Urine Strep Pneumo and L. Pneumophila Serogp 1 Ur Ag negative -Admission CXR read "Bilateral lung base opacity. This may reflect scarring or interstitial thickening that has developed since the prior study. Bibasilar pneumonia should be considered if there are consistent clinical findings. Hyperexpanded lungs consistent with underlying COPD." -CXR yesterday AM read as "COPD. Bibasilar pulmonary densities which may reflect acute atelectasis or pneumonia. This is new since the study of April 13,2009. However, chronic interstitial changes here could have developed over the intervening 10 years. A trial of appropriate antibiotics would be useful with follow-up radiographs to assess the lung parenchyma for improvement. Thoracic aortic atherosclerosis."  -IV Abx with IV Ceftriaxone and IV Azithromycin now changed to po Cefdinir and po Azithromycin for completion of course  -WBCs went from 22.8 and is now 10.9 -C/w DuoNeb Breathing Treatments (now BID while hospitalized but will make every 6 hours as needed at discharge)  -SLP evaluation done and patient ok for Regular Diet Thin Liquids  -Discontinued IVF rehydration  -Added Incentive Spirometry, Flutter Valve and Mucinex -Repeat chest x-ray  in 3 to 6 weeks -Follow-up with PCP within  1 to 2 weeks  COPD -As above -Follow up with PCP at D/C  Acute Metabolic Encephalopathy with some component of Delirium, improved -Improving. Per Wife not at baseline but is improved -Likely was narcoticsODinduced Vs ??CAP -Mental status improved slightly after Narcan x2 was given -UDS negative -CT head negative for acute Intracranial Abnormality -MRI head negative for any acute changes -Monitor closely -Delirium Precautions -Follow-up with PCP as an outpatient  AKI onCKD-II, improved  -Improved,status post fluids; Given 500 mL bolus and then started on Maintenance IVF at 75 mL/hr and now D/C'd -Baseline Cr is1.0-1.2., pt's Creatine was1.81on admission -BUN/Creatinine is now 13/0.83 -Avoid Nephrotoxic Medications if possible -Repeat CMP in AM   DMtype 2 with Hypoglycemia -Initially had episodes of hypoglycemic episodes (has been NPO Vs ?OD on meds) -Last A1c10.0 on 08/20/17, poorly controlled -Held HomeLantus, Victoza, Metformin; will stop patient's home Victoza and decrease Lantus dose at discharge to Lantus at 20 units Daily for now -CBGs ranging from 146-189 -Consulted Diabetes Education Coordinator for further assistance management -Follow-up with PCP at discharge  Stage 3 pressure sore to R heel -??PVD -Pt scheduled to have arterial doppler as an outpt 10/02/17, ordered for inpatient and done today however was unable to tolerate due to pain -ABIs as below and normal  -WOC Nurse Consult appreciated  -Follow-up with wound care as an outpatient  Essential Hypertension -Continue Metoprolol Succinate 12.5 mg p.o. daily -C/w IV Hydralazine 10 mg IV every 6 as needed for high blood pressure with systolic blood pressure greater than 175 as needed  Chronic Severe Back Pain -Due to possibility of Overdosing, held home Gabapentin, Tramadol, and Baclofen -We will resume home Tramadol 50 mg every 6 as needed -Resumed Home Gapabentin at 300 mg po TID and increased to  600 mg TID for D/C -Resume Home Baclofen at this time and continue at D/C -Follow up with Pain Specialist at D/C  Depression/Anxiety -C/w Home Duloxetine 60 mg po Daily and Buspirone 30 mg po BID -Stop Home Amitriptyline  Tobacco Abuse -Smoking Cessation Counseling given -C/w Nicotine Patch 21 mg TD  Normocytic Anemia -Hb/Hct stable at 10.2/30.4 -Continue to Monitor for S/Sx of Bleeding -Repeat CBC as an outpatient   Hypomagnesemia, improving  -Pateint's Magnesium was 1.1 and improved to 1.6 -Replete with IV mag sulfate 2 g prior to D/C -Continue to monitor and replete as necessary -Repeat magnesium level as an outpateint   Hypokalemia, improved -Patient's potassium level this morning was 3.5 -Continue to monitor and replete as necessary -Repeat CMP as an outpatient   Discharge Instructions  Discharge Instructions    Call MD for:  difficulty breathing, headache or visual disturbances   Complete by:  As directed    Call MD for:  extreme fatigue   Complete by:  As directed    Call MD for:  hives   Complete by:  As directed    Call MD for:  persistant dizziness or light-headedness   Complete by:  As directed    Call MD for:  persistant nausea and vomiting   Complete by:  As directed    Call MD for:  redness, tenderness, or signs of infection (pain, swelling, redness, odor or green/yellow discharge around incision site)   Complete by:  As directed    Call MD for:  severe uncontrolled pain   Complete by:  As directed    Call MD for:  temperature >100.4   Complete by:  As directed    DME Nebulizer/meds   Complete by:  As directed    Patient needs a nebulizer to treat with the following condition:  Pneumonia   Diet - low sodium heart healthy   Complete by:  As directed    Diet Carb Modified   Complete by:  As directed    Discharge instructions   Complete by:  As directed    Follow up primary care physician at discharge.  Repeat chest x-ray in 3 to 6 weeks.   Take all medications as prescribed.  If symptoms change or worsen please return to the emergency room for evaluation   Increase activity slowly   Complete by:  As directed      Allergies as of 10/04/2017   No Known Allergies     Medication List    STOP taking these medications   amitriptyline 10 MG tablet Commonly known as:  ELAVIL   diclofenac 75 MG EC tablet Commonly known as:  VOLTAREN   insulin glargine 100 UNIT/ML injection Commonly known as:  LANTUS Replaced by:  Insulin Glargine 100 UNIT/ML Solostar Pen   liraglutide 18 MG/3ML Sopn Commonly known as:  VICTOZA   silver sulfADIAZINE 1 % cream Commonly known as:  SILVADENE     TAKE these medications   aspirin 81 MG chewable tablet Chew 81 mg by mouth 2 (two) times a week. Pt takes on Monday and Friday.   atorvastatin 40 MG tablet Commonly known as:  LIPITOR Take 1 tablet (40 mg total) by mouth daily.   azithromycin 250 MG tablet Commonly known as:  ZITHROMAX Take 2 tablets (500 mg total) by mouth daily for 3 days.   baclofen 10 MG tablet Commonly known as:  LIORESAL TAKE 1 TABLET BY MOUTH 3 TIMES DAILY   busPIRone 15 MG tablet Commonly known as:  BUSPAR TAKE 2 TABLETS BY MOUTH 2 TIMES DAILY   cefdinir 300 MG capsule Commonly known as:  OMNICEF Take 1 capsule (300 mg total) by mouth every 12 (twelve) hours.   collagenase ointment Commonly known as:  SANTYL Apply 1 application topically daily.   DULoxetine 60 MG capsule Commonly known as:  CYMBALTA TAKE 1 CAPSULE BY MOUTH EVERY DAY   fluocinonide cream 0.05 % Commonly known as:  LIDEX Apply topically 3 (three) times daily as needed. For itching What changed:    how much to take  reasons to take this  additional instructions   gabapentin 600 MG tablet Commonly known as:  NEURONTIN Take 1 tablet (600 mg total) by mouth 3 (three) times daily. What changed:  how much to take   glucose blood test strip Use to test blood sugar three-four times  daily.   guaiFENesin 600 MG 12 hr tablet Commonly known as:  MUCINEX Take 2 tablets (1,200 mg total) by mouth 2 (two) times daily.   Insulin Glargine 100 UNIT/ML Solostar Pen Commonly known as:  LANTUS SOLOSTAR Inject 20 Units into the skin daily at 10 pm. Replaces:  insulin glargine 100 UNIT/ML injection   ipratropium-albuterol 0.5-2.5 (3) MG/3ML Soln Commonly known as:  DUONEB Take 3 mLs by nebulization every 6 (six) hours as needed.   metFORMIN 500 MG tablet Commonly known as:  GLUCOPHAGE Take 2 tablets (1,000 mg total) by mouth 2 (two) times daily with a meal.   metoprolol succinate 25 MG 24 hr tablet Commonly known as:  TOPROL-XL Take 0.5 tablets (12.5 mg total) by mouth daily.   nicotine 21 mg/24hr patch Commonly known  as:  NICODERM CQ - dosed in mg/24 hours Place 1 patch (21 mg total) onto the skin daily. Start taking on:  10/05/2017   Pen Needles 32G X 4 MM Misc Inject 1 application into the skin as directed. USE WITH INSULIN PEN   traMADol 50 MG tablet Commonly known as:  ULTRAM TAKE 1 TABLET BY MOUTH EVERY 6 HOURS AS NEEDED What changed:    how much to take  how to take this  when to take this   triamcinolone ointment 0.1 % Commonly known as:  KENALOG Apply topically 2 (two) times daily. 1:1 compound with Eucerin What changed:    how much to take  when to take this  reasons to take this  additional instructions            Durable Medical Equipment  (From admission, onward)        Start     Ordered   10/04/17 0000  DME Nebulizer/meds    Question:  Patient needs a nebulizer to treat with the following condition  Answer:  Pneumonia   10/04/17 1336      No Known Allergies  Consultations:  None  Procedures/Studies: Ct Head Wo Contrast  Result Date: 09/30/2017 CLINICAL DATA:  Altered level of consciousness for 1 week. EXAM: CT HEAD WITHOUT CONTRAST TECHNIQUE: Contiguous axial images were obtained from the base of the skull through the  vertex without intravenous contrast. COMPARISON:  12/18/2016 FINDINGS: Brain: No evidence of acute infarction, hemorrhage, hydrocephalus, extra-axial collection or mass lesion/mass effect. There is ventricular and sulcal enlargement reflecting mild atrophy. Patchy white matter hypoattenuation is seen bilaterally consistent with moderate to advanced chronic microvascular ischemic change. There are few small deep white matter lacune infarcts, chronic. Vascular: No hyperdense vessel or unexpected calcification. Skull: Normal. Negative for fracture or focal lesion. Sinuses/Orbits: Visualize globes and orbits are unremarkable. Visualized sinuses and mastoid air cells are clear. Other: None. IMPRESSION: 1. No acute intracranial abnormalities. 2. 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 Electronically Signed   By: Amie Portland M.D.   On: 09/30/2017 21:44   Mr Brain Wo Contrast  Result Date: 10/01/2017 CLINICAL DATA:  Altered level of consciousness. Lethargy beginning yesterday. Evaluate for CVA EXAM: MRI HEAD WITHOUT CONTRAST TECHNIQUE: Multiplanar, multiecho pulse sequences of the brain and surrounding structures were obtained without intravenous contrast. COMPARISON:  Head CT from yesterday FINDINGS: Brain: Extensive FLAIR hyperintensity in the cerebral white matter and pons without mass effect. There are numerous chronic lacunar infarcts in the bilateral centrum semiovale, bilateral thalamus, and left corona radiata. Prominent number of dilated perivascular spaces at the deep gray nuclei. No chronic blood products are seen. No acute infarct, hemorrhage, hydrocephalus, or collection. Vascular: Major flow voids are preserved. Skull and upper cervical spine: No evidence of marrow lesion Sinuses/Orbits: Left cataract resection. IMPRESSION: 1. No acute finding.  Negative for acute infarct. 2. Advanced chronic small vessel ischemia with multiple  remote lacunar infarcts. Electronically Signed   By: Marnee Spring M.D.   On: 10/01/2017 11:57   Dg Chest Port 1 View  Result Date: 10/02/2017 CLINICAL DATA:  Shortness of breath. History of COPD, current smoker, sepsis. EXAM: PORTABLE CHEST 1 VIEW COMPARISON:  Portable chest x-ray of Sep 30, 2017 and PA and lateral chest x-ray of Oct 08, 2007. FINDINGS: The lungs are adequately inflated. The lung markings are coarse in the infrahilar region which is unchanged. There is no pleural effusion. The  heart and pulmonary vascularity are normal. The mediastinum is normal in width. The trachea is midline. There is calcification in the wall of the aortic arch. The bony thorax exhibits no acute abnormality. IMPRESSION: COPD. Bibasilar pulmonary densities which may reflect acute atelectasis or pneumonia. This is new since the study of September 08, 2007. However, chronic interstitial changes here could have developed over the intervening 10 years. A trial of appropriate antibiotics would be useful with follow-up radiographs to assess the lung parenchyma for improvement. Thoracic aortic atherosclerosis. Electronically Signed   By: David  [place] M.D.   On: 10/02/2017 10:38   Dg Chest Port 1 View  Result Date: 09/30/2017 CLINICAL DATA:  patient coming from home, wife reports patient has been mentally altered x1 week.H/o COPD, Diabetes type 1 and 2, HTN. Smoker. EXAM: PORTABLE CHEST 1 VIEW COMPARISON:  09/08/2007 FINDINGS: Cardiac silhouette is normal in size. No mediastinal or hilar masses. Lungs are hyperexpanded. There is opacity at both lung bases which is new from the prior exam. Remainder of the lungs is clear. No convincing pleural effusion.  No pneumothorax. Skeletal structures are grossly intact. IMPRESSION: 1. Bilateral lung base opacity. This may reflect scarring or interstitial thickening that has developed since the prior study. Bibasilar pneumonia should be considered if there are consistent clinical findings. 2.  Hyperexpanded lungs consistent with underlying COPD. Electronically Signed   By: Amie Portland M.D.   On: 09/30/2017 21:45    Subjective: Seen and examined at bedside and improved significantly.  Denied any chest pain, shortness breath, nausea, vomiting still complained of some chronic back pain.  No other complaints or concern is happy that he is going home.  Discharge Exam: Vitals:   10/04/17 0731 10/04/17 1238  BP:  (!) 154/79  Pulse: 86 95  Resp: 16 16  Temp:    SpO2: 95% 98%   Vitals:   10/03/17 2024 10/04/17 0449 10/04/17 0731 10/04/17 1238  BP: (!) 144/72 (!) 169/94  (!) 154/79  Pulse: 89 90 86 95  Resp: Temp: 98.3 F (36.8 C) 98.4 F (36.9 C)    TempSrc: Oral Oral    SpO2: 97% 99% 95% 98%  Weight:      Height:       General: Pt is alert, awake, not in acute distress Cardiovascular: RRR, S1/S2 +, no rubs, no gallops Respiratory: Diminished bilaterally at the bases, no wheezing, no rhonchi; Unlabored breathing and is not tachypenic  Abdominal: Soft, NT, ND, bowel sounds + Extremities: no edema, no cyanosis; Has a Right Heel Ulcer   The results of significant diagnostics from this hospitalization (including imaging, microbiology, ancillary and laboratory) are listed below for reference.    Microbiology: Recent Results (from the past 240 hour(s))  Urine Culture     Status: None   Collection Time: 09/30/17 11:47 PM  Result Value Ref Range Status   Specimen Description   Final    URINE, RANDOM Performed at [facility], [address] [place], [place] [address]    Special Requests   Final    NONE Performed at [facility], [address], [place], [place] [address]    Culture   Final    NO GROWTH Performed at [facility], [address] [place], [place] [address]    Report Status 10/02/2017 FINAL  Final  Culture, blood (Routine X 2) w Reflex to ID Panel     Status: None (Preliminary result)  Collection Time: 10/01/17 12:05 AM  Result Value Ref Range Status   Specimen Description   Final    BLOOD BLOOD RIGHT FOREARM Performed at [facility], [address], [place], [place] [address]    Special Requests   Final    BOTTLES DRAWN AEROBIC ONLY Blood Culture adequate volume Performed at [facility], [address] [place], [place] [address]    Culture   Final    NO GROWTH 3 DAYS Performed at [facility], [address] [place], [place] [address]    Report Status PENDING  Incomplete  Culture, blood (Routine X 2) w Reflex to ID Panel     Status: None (Preliminary result)   Collection Time: 10/01/17 12:08 AM  Result Value Ref Range Status   Specimen Description   Final    BLOOD LEFT HAND Performed at [facility], [address] [place], [place] [address]    Special Requests   Final    IN PEDIATRIC BOTTLE Blood Culture adequate volume Performed at [facility], [address], [place], [place] [address]    Culture   Final    NO GROWTH 3 DAYS Performed at [facility], [address], [place], [place] [address]    Report Status PENDING  Incomplete    Labs: BNP (last 3 results) No results for input(s): BNP in the last 8760 hours. Basic Metabolic Panel: Recent Labs  Lab 09/30/17 2205 10/01/17 1334 10/02/17 0514 10/03/17 0516 10/04/17 0511  NA 140 144 140 139 141  K 3.4* 4.0 3.6 3.4* 3.5  CL 102 110 103 101 106  CO2 21* 24  GLUCOSE 272* 103* 185* 258* 168*  BUN 38* 25* CREATININE 1.81* 1.17 0.88 0.94 0.83  CALCIUM 9.3 8.6* 8.7* 9.0 8.6*  MG  --   --   --  1.1* 1.6*  PHOS  --   --   --  3.6 3.3   Liver Function Tests: Recent Labs  Lab 09/30/17 2205 10/03/17 0516 10/04/17 0511  AST ALT 18 32 26  ALKPHOS 34* 36* 38  BILITOT 0.6 0.9 0.8  PROT 7.0 6.7 6.4*  ALBUMIN 3.6 3.1* 3.0*   No results for input(s): LIPASE, AMYLASE in the last  168 hours. Recent Labs  Lab 09/30/17 2101  AMMONIA 12   CBC: Recent Labs  Lab 09/30/17 2205 10/01/17 1334 10/02/17 0514 10/03/17 0516 10/04/17 0511  WBC 15.7* 22.8* 17.6* 15.6* 10.9*  NEUTROABS 13.6* 18.5* 13.7* 11.9* 6.8  HGB 10.8* 10.6* 10.0* 10.2* 10.2*  HCT 32.3* 32.1* 29.4* 30.1* 30.4*  MCV 90.0 92.0 89.9 88.8 88.6  PLT 223 215 204 222 273   Cardiac Enzymes: No results for input(s): CKTOTAL, CKMB, CKMBINDEX, TROPONINI in the last 168 hours. BNP: Invalid input(s): POCBNP CBG: Recent Labs  Lab 10/03/17 1147 10/03/17 1656 10/03/17 2021 10/04/17 0721 10/04/17 1140  GLUCAP 240* 237* 189* 146* 188*   D-Dimer No results for input(s): DDIMER in the last 72 hours. Hgb A1c No results for input(s): HGBA1C in the last 72 hours. Lipid Profile No results for input(s): CHOL, HDL, LDLCALC, TRIG, CHOLHDL, LDLDIRECT in the last 72 hours. Thyroid function studies No results for input(s): TSH, T4TOTAL, T3FREE, THYROIDAB in the last 72 hours.  Invalid input(s): FREET3 Anemia work up No results for input(s): VITAMINB12, FOLATE, FERRITIN, TIBC, IRON, RETICCTPCT in the last 72 hours. Urinalysis  Component Value Date/Time   COLORURINE YELLOW 09/30/2017 2347   APPEARANCEUR HAZY (A) 09/30/2017 2347   LABSPEC 1.018 09/30/2017 2347   PHURINE 5.0 09/30/2017 2347   GLUCOSEU >=500 (A) 09/30/2017 2347   GLUCOSEU NEGATIVE 09/30/2008 0851   HGBUR NEGATIVE 09/30/2017 2347   HGBUR trace-lysed 03/29/2010 0940   BILIRUBINUR NEGATIVE 09/30/2017 2347   BILIRUBINUR neg 01/13/2014 1141   KETONESUR NEGATIVE 09/30/2017 2347   PROTEINUR NEGATIVE 09/30/2017 2347   UROBILINOGEN 1.0 01/13/2014 1141   UROBILINOGEN 0.2 03/29/2010 0940   NITRITE NEGATIVE 09/30/2017 2347   LEUKOCYTESUR NEGATIVE 09/30/2017 2347   Sepsis Labs Invalid input(s): PROCALCITONIN,  WBC,  LACTICIDVEN Microbiology Recent Results (from the past 240 hour(s))  Urine Culture     Status: None   Collection Time: 09/30/17  11:47 PM  Result Value Ref Range Status   Specimen Description   Final    URINE, RANDOM Performed at [facility], [address], [place], [place] [address]    Special Requests   Final    NONE Performed at [facility], [address] [place], [place] [address]    Culture   Final    NO GROWTH Performed at [facility], [address] [place], [place] [address]    Report Status 10/02/2017 FINAL  Final  Culture, blood (Routine X 2) w Reflex to ID Panel     Status: None (Preliminary result)   Collection Time: 10/01/17 12:05 AM  Result Value Ref Range Status   Specimen Description   Final    BLOOD BLOOD RIGHT FOREARM Performed at [facility], [address], [place], [place] [address]    Special Requests   Final    BOTTLES DRAWN AEROBIC ONLY Blood Culture adequate volume Performed at [facility], [address], [place], [place] [address]    Culture   Final    NO GROWTH 3 DAYS Performed at [facility], [address], [place], [place] [address]    Report Status PENDING  Incomplete  Culture, blood (Routine X 2) w Reflex to ID Panel     Status: None (Preliminary result)   Collection Time: 10/01/17 12:08 AM  Result Value Ref Range Status   Specimen Description   Final    BLOOD LEFT HAND Performed at [facility], [address], [place], [place] [address]    Special Requests   Final    IN PEDIATRIC BOTTLE Blood Culture adequate volume Performed at [facility], [address], [place], [place] [address]    Culture   Final    NO GROWTH 3 DAYS Performed at [facility], [address] [place], [place] [address]    Report Status PENDING  Incomplete   Time coordinating discharge: 35 minutes  SIGNED:  Merlene Laughter, DO [facility] 10/04/2017, 1:38 PM Pager [phone]  If 7PM-7AM, please contact  night-coverage [email or web address] Password [email or web address]

## 2017-10-04 NOTE — Care Management Important Message (Signed)
Important Message  Patient Details  Name: Jeremy Gill MRN: [id number] Date of Birth: [birth year]   Medicare Important Message Given:  Yes    Caren Macadam 10/04/2017, 12:39 PMImportant Message  Patient Details  Name: Jeremy Gill MRN: [id number] Date of Birth: [birth year]   Medicare Important Message Given:  Yes    Caren Macadam 10/04/2017, 12:39 PM

## 2017-10-04 NOTE — Telephone Encounter (Signed)
Rx for nebulizer was sent to his pharmacy.  Thanks, BJ

## 2017-10-04 NOTE — Telephone Encounter (Signed)
Copied from CRM [id number]. Topic: Quick Communication - See Telephone Encounter >> Oct 04, 2017  4:14 PM Lorrine Kin, [place] wrote: CRM for notification. See Telephone encounter for: 10/04/17. Patient's wife calling and states that the hospital prescribed duoneb today upon his discharge. She states that she does not have that machine to use the duoneb. Please advise. CB#: [phone] [facility] [place] - [place], [place] - [address]

## 2017-10-04 NOTE — Telephone Encounter (Signed)
Message sent to Dr. Jordan for review. 

## 2017-10-04 NOTE — Telephone Encounter (Signed)
Patient's wife informed that Rx for nebulizer was sent to pharmacy.

## 2017-10-06 LAB — CULTURE, BLOOD (ROUTINE X 2)
CULTURE: NO GROWTH
Culture: NO GROWTH
SPECIAL REQUESTS: ADEQUATE
SPECIAL REQUESTS: ADEQUATE

## 2017-10-07 ENCOUNTER — Ambulatory Visit: Payer: PPO | Admitting: Podiatry

## 2017-10-07 ENCOUNTER — Telehealth: Payer: Self-pay | Admitting: Family Medicine

## 2017-10-07 NOTE — Telephone Encounter (Signed)
Or fax rx to [facility] at [phone]

## 2017-10-07 NOTE — Telephone Encounter (Signed)
Transition Care Management Follow-up Telephone Call  Physician Discharge Summary  Jeremy Gill:[id number] DOB: Jul 13, 1945 DOA: 09/30/2017  PCP: [place], [name], MD  Admit date: 09/30/2017 Discharge date: 10/04/2017  Admitted From: Home Disposition: Home (Declined Home Health PT)  Recommendations for Outpatient Follow-up:  1. Follow up with PCP in 1-2 weeks 2. Please obtain CMP/CBC, Mag, Phos in one week 3. Please follow up on the following pending results:  Home Health: No  Equipment/Devices: DME Nebulizer   Discharge Condition: Stable CODE STATUS: FULL CODE Diet recommendation: Heart Healthy Carb Modified Diet  Brief/Interim Summary: Jeremy Stoneking Bullardis a 72 y.o.malewith medical history significantforHypertension, Hyperlipidemia, Diabetes Mellitus, Depression, Anxiety, Tobacco abuse, Psoriasis, CKD 3, chronic back pain and other comorbids who presented with altered mental status, and noted to be more lethargic and sleepy. As per wife, pt got up early in the am PTA in ED, took his meds, which he never did as he usually gets confused about his meds (wife usually gives his meds). Pt has severe chronic back pain for which he takes tramadol and baclofen. Wife unsure if he OD.Denied any other symptoms. In theED, pt was barelyarousable, not oriented x3,moves all extremities slightly upon painful stimuli,pinpoint pupils. Notedto have leukocytosis,chest x-ray showed bilateral basilar opacity, CT head negative. Patient admitted for further management of bilateral community-acquired pneumonia with suspected possible aspiration.  He was placed on IV antibiotics as well as breathing treatments and subsequently improved.  His encephalopathy also improved and was awake and alert and oriented x3 today.  Because of his improvement he was deemed medically stable to be discharged and his antibiotics were transitioned to p.o.  He will be discharged home at this time and will need to  follow-up with PCP within 1 to 2 weeks and have repeat chest x-ray in 3 to 6 weeks    How have you been since you were released from the hospital? "okay"   Do you understand why you were in the hospital? yes   Do you understand the discharge instructions? yes   Where were you discharged to? Home   Items Reviewed:  Medications reviewed: yes  Allergies reviewed: yes  Dietary changes reviewed: yes  Referrals reviewed: yes   Functional Questionnaire:   Activities of Daily Living (ADLs):   He states they are independent in the following: ambulation, bathing and hygiene, feeding, continence, grooming, toileting and dressing States they require assistance with the following: none   Any transportation issues/concerns?: no   Any patient concerns? no   Confirmed importance and date/time of follow-up visits scheduled yes  Provider Appointment booked with Dr. Swaziland on 10/11/2017 Friday 12:00 pm  Confirmed with patient if condition begins to worsen call PCP or go to the ER.  Patient was given the office number and encouraged to call back with question or concerns.  : yes

## 2017-10-07 NOTE — Telephone Encounter (Signed)
Pts wife states phramacy did not receive rx but she also wants to pick up a hardcopy of the rx and take it to a home medical supply company to have filled. Please call ready for pickup.

## 2017-10-08 ENCOUNTER — Other Ambulatory Visit: Payer: Self-pay | Admitting: Physical Medicine & Rehabilitation

## 2017-10-08 NOTE — Telephone Encounter (Signed)
Patient's wife, Elease Hashimoto, calling and states she is returning a call to [place]. Please advise. CB#: [phone]

## 2017-10-08 NOTE — Telephone Encounter (Signed)
Caller name: Mrs. Mulvey  Relation to pt: spouse  Call back number: [phone] (M)   Reason for call:  Mrs. Corcoran returning call, please advise

## 2017-10-08 NOTE — Telephone Encounter (Signed)
Patient's wife called back to check status.

## 2017-10-08 NOTE — Telephone Encounter (Signed)
Spoke with patient's wife and informed her that I faxed over a Rx for the nebulizer to [facility] as requested.

## 2017-10-08 NOTE — Telephone Encounter (Signed)
Left message for patient to give me a call back concerning nebulizer.

## 2017-10-09 ENCOUNTER — Ambulatory Visit (INDEPENDENT_AMBULATORY_CARE_PROVIDER_SITE_OTHER): Payer: PPO | Admitting: Podiatry

## 2017-10-09 ENCOUNTER — Telehealth: Payer: Self-pay | Admitting: *Deleted

## 2017-10-09 DIAGNOSIS — M79676 Pain in unspecified toe(s): Secondary | ICD-10-CM | POA: Diagnosis not present

## 2017-10-09 DIAGNOSIS — E0843 Diabetes mellitus due to underlying condition with diabetic autonomic (poly)neuropathy: Secondary | ICD-10-CM | POA: Diagnosis not present

## 2017-10-09 DIAGNOSIS — I70235 Atherosclerosis of native arteries of right leg with ulceration of other part of foot: Secondary | ICD-10-CM

## 2017-10-09 DIAGNOSIS — L97412 Non-pressure chronic ulcer of right heel and midfoot with fat layer exposed: Secondary | ICD-10-CM

## 2017-10-09 DIAGNOSIS — B351 Tinea unguium: Secondary | ICD-10-CM

## 2017-10-09 NOTE — Telephone Encounter (Signed)
Pt's wife, Elease Hashimoto states he just got out of the hospital on Friday, he has an appt today, but is not feeling well. I reviewed pt's clinicals and he had not be seen by Dr. Logan Bores for wound care since 08/2017. Elease Hashimoto states pt did not get much wound care in the hospital, because they were taking care of his pneumonia. I asked Elease Hashimoto if pt had home health care and she denied. I told Elease Hashimoto I felt pt would benefit from seeing Dr. Logan Bores today for wound care. Elease Hashimoto spoke to someone at her home and she agreed to keep pt's appt today.

## 2017-10-11 ENCOUNTER — Inpatient Hospital Stay: Payer: PPO | Admitting: Family Medicine

## 2017-10-14 NOTE — Progress Notes (Signed)
Subjective:  72 year old male with PMHx of T1DM presenting today for follow up evaluation of an ulceration to the right heel. He states he is doing well overall. He has been applying the Santyl ointment as directed.  He is also complaining of elongated, thickened nails that cause pain while ambulating in shoes. He is unable to trim her own nails. Patient is here for further evaluation and treatment.   Past Medical History:  Diagnosis Date  . ANKLE SPRAIN 12/16/2006   Qualifier: Diagnosis of  By: Tawanna Cooler MD, Eugenio Hoes   . ANXIETY 10/25/2006  . CELLULITIS, LEG, RIGHT 09/08/2007   Qualifier: Diagnosis of  By: Tawanna Cooler MD, Eugenio Hoes   . Chronic pain disorder 11/30/2015  . COPD 10/25/2006  . 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 DIABETES MELLITUS, TYPE I 10/25/2006  . Diabetic polyneuropathy (HCC) 04/08/2007   Qualifier: Diagnosis of  By: Tawanna Cooler MD, Eugenio Hoes   . 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 Essential hypertension 10/25/2006   Qualifier: Diagnosis of  By: Rosette Reveal RN, Jorene Minors   . Generalized osteoarthritis of multiple sites 11/30/2015  . HYPERTENSION 10/25/2006  . 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 Low back pain with radiation 01/23/2016  . Polyneuropathy in diabetes(357.2) 04/08/2007  . Psoriasis   . TOBACCO ABUSE 07/08/2007  . VENOUS INSUFFICIENCY 11/25/2007      Objective/Physical Exam General: The patient is alert and oriented x3 in no acute distress.  Dermatology:  Wound #1 noted to the right heel measuring 1.5 x 1.5 x 0.1 cm (LxWxD).   To the noted ulceration(s), there is no eschar. There is a moderate amount of slough, fibrin, and necrotic tissue noted. Granulation tissue and wound base is red. There is a minimal amount of serosanguineous drainage noted. There is no exposed bone muscle-tendon ligament or joint. There is no malodor.  Periwound integrity is intact. Skin is warm, dry and supple bilateral lower extremities. Nails are tender, long, thickened and dystrophic with subungual debris, consistent with onychomycosis, 1-5 bilateral. No signs of infection noted.  Vascular: Palpable pedal pulses bilaterally. No edema or erythema noted. Capillary refill within normal limits.  Neurological: Epicritic and protective threshold diminished bilaterally.   Musculoskeletal Exam: Range of motion within normal limits to all pedal and ankle joints bilateral. Muscle strength 5/5 in all groups bilateral.   Assessment: #1 ulceration noted to the right heel secondary to diabetes mellitus #2 diabetes mellitus w/ peripheral neuropathy #3 Onychomycosis of nail due to dermatophyte bilateral  Plan of Care:  #1 Patient was evaluated. #2 medically necessary excisional debridement including subcutaneous tissue was performed using a tissue nipper and a chisel blade. Excisional debridement of all the necrotic nonviable tissue down to healthy bleeding viable tissue was performed with post-debridement measurements same as pre-. #3 the wound was cleansed and dry sterile dressing applied. #4 Continue using Santyl ointment daily with a dry sterile dressing.  #5 Mechanical debridement of nails 1-5 bilaterally performed using a nail nipper. Filed with dremel without incident.  #6 Return to clinic in 3 weeks.   Felecia Shelling, DPM [facility]  Dr. Felecia Shelling, DPM    [address]  [place], [place] [address]                Office [phone]  Fax [phone]

## 2017-10-15 ENCOUNTER — Emergency Department (HOSPITAL_COMMUNITY)
Admission: EM | Admit: 2017-10-15 | Discharge: 2017-10-15 | Disposition: A | Discharge status: Home / Self Care | Payer: PPO | Attending: Emergency Medicine | Admitting: Emergency Medicine

## 2017-10-15 ENCOUNTER — Emergency Department (HOSPITAL_COMMUNITY): Payer: PPO

## 2017-10-15 ENCOUNTER — Inpatient Hospital Stay: Payer: PPO | Admitting: Family Medicine

## 2017-10-15 ENCOUNTER — Encounter (HOSPITAL_COMMUNITY): Payer: Self-pay

## 2017-10-15 DIAGNOSIS — J449 Chronic obstructive pulmonary disease, unspecified: Secondary | ICD-10-CM | POA: Insufficient documentation

## 2017-10-15 DIAGNOSIS — F17290 Nicotine dependence, other tobacco product, uncomplicated: Secondary | ICD-10-CM | POA: Insufficient documentation

## 2017-10-15 DIAGNOSIS — I129 Hypertensive chronic kidney disease with stage 1 through stage 4 chronic kidney disease, or unspecified chronic kidney disease: Secondary | ICD-10-CM | POA: Diagnosis not present

## 2017-10-15 DIAGNOSIS — Z79899 Other long term (current) drug therapy: Secondary | ICD-10-CM | POA: Insufficient documentation

## 2017-10-15 DIAGNOSIS — S299XXA Unspecified injury of thorax, initial encounter: Secondary | ICD-10-CM | POA: Diagnosis not present

## 2017-10-15 DIAGNOSIS — M545 Low back pain: Secondary | ICD-10-CM | POA: Insufficient documentation

## 2017-10-15 DIAGNOSIS — R4020 Unspecified coma: Secondary | ICD-10-CM | POA: Diagnosis not present

## 2017-10-15 DIAGNOSIS — R05 Cough: Secondary | ICD-10-CM | POA: Diagnosis not present

## 2017-10-15 DIAGNOSIS — S0990XA Unspecified injury of head, initial encounter: Secondary | ICD-10-CM | POA: Diagnosis not present

## 2017-10-15 DIAGNOSIS — Z7982 Long term (current) use of aspirin: Secondary | ICD-10-CM | POA: Diagnosis not present

## 2017-10-15 DIAGNOSIS — M549 Dorsalgia, unspecified: Secondary | ICD-10-CM | POA: Diagnosis not present

## 2017-10-15 DIAGNOSIS — S3992XA Unspecified injury of lower back, initial encounter: Secondary | ICD-10-CM | POA: Diagnosis not present

## 2017-10-15 DIAGNOSIS — R531 Weakness: Secondary | ICD-10-CM | POA: Diagnosis not present

## 2017-10-15 DIAGNOSIS — G8929 Other chronic pain: Secondary | ICD-10-CM | POA: Diagnosis not present

## 2017-10-15 DIAGNOSIS — E1122 Type 2 diabetes mellitus with diabetic chronic kidney disease: Secondary | ICD-10-CM | POA: Insufficient documentation

## 2017-10-15 DIAGNOSIS — N183 Chronic kidney disease, stage 3 (moderate): Secondary | ICD-10-CM | POA: Insufficient documentation

## 2017-10-15 DIAGNOSIS — Z7984 Long term (current) use of oral hypoglycemic drugs: Secondary | ICD-10-CM | POA: Insufficient documentation

## 2017-10-15 DIAGNOSIS — R404 Transient alteration of awareness: Secondary | ICD-10-CM | POA: Diagnosis not present

## 2017-10-15 LAB — CBC
HCT: 35.3 % — ABNORMAL LOW (ref 39.0–52.0)
Hemoglobin: 12 g/dL — ABNORMAL LOW (ref 13.0–17.0)
MCH: 30.3 pg (ref 26.0–34.0)
MCHC: 34 g/dL (ref 30.0–36.0)
MCV: 89.1 fL (ref 78.0–100.0)
PLATELETS: 309 10*3/uL (ref 150–400)
RBC: 3.96 MIL/uL — ABNORMAL LOW (ref 4.22–5.81)
RDW: 12.8 % (ref 11.5–15.5)
WBC: 18.8 10*3/uL — ABNORMAL HIGH (ref 4.0–10.5)

## 2017-10-15 LAB — COMPREHENSIVE METABOLIC PANEL
ALK PHOS: 40 U/L (ref 38–126)
ALT: 17 U/L (ref 17–63)
AST: 17 U/L (ref 15–41)
Albumin: 3.8 g/dL (ref 3.5–5.0)
Anion gap: 13 (ref 5–15)
BUN: 34 mg/dL — ABNORMAL HIGH (ref 6–20)
CALCIUM: 9 mg/dL (ref 8.9–10.3)
CHLORIDE: 100 mmol/L — AB (ref 101–111)
CO2: 26 mmol/L (ref 22–32)
Creatinine, Ser: 1.34 mg/dL — ABNORMAL HIGH (ref 0.61–1.24)
GFR, EST AFRICAN AMERICAN: 59 mL/min — AB (ref >60)
GFR, EST NON AFRICAN AMERICAN: 51 mL/min — AB (ref >60)
Glucose, Bld: 201 mg/dL — ABNORMAL HIGH (ref 65–99)
Potassium: 3.3 mmol/L — ABNORMAL LOW (ref 3.5–5.1)
Sodium: 139 mmol/L (ref 135–145)
TOTAL PROTEIN: 8.2 g/dL — AB (ref 6.5–8.1)
Total Bilirubin: 1 mg/dL (ref 0.3–1.2)

## 2017-10-15 LAB — CBG MONITORING, ED: Glucose-Capillary: 222 mg/dL — ABNORMAL HIGH (ref 65–99)

## 2017-10-15 LAB — I-STAT CG4 LACTIC ACID, ED: Lactic Acid, Venous: 1.38 mmol/L (ref 0.5–1.9)

## 2017-10-15 LAB — URINALYSIS, ROUTINE W REFLEX MICROSCOPIC
BACTERIA UA: NONE SEEN
Bilirubin Urine: NEGATIVE
Glucose, UA: 150 mg/dL — AB
Ketones, ur: NEGATIVE mg/dL
Leukocytes, UA: NEGATIVE
Nitrite: NEGATIVE
Protein, ur: 30 mg/dL — AB
SPECIFIC GRAVITY, URINE: 1.018 (ref 1.005–1.030)
pH: 5 (ref 5.0–8.0)

## 2017-10-15 LAB — AMMONIA: Ammonia: <9 umol/L — ABNORMAL LOW (ref 9–35)

## 2017-10-15 MED ORDER — HYDROMORPHONE HCL 1 MG/ML IJ SOLN
0.5000 mg | Freq: Once | INTRAMUSCULAR | Status: AC
  Administered 2017-10-15: 0.5 mg via INTRAVENOUS
  Filled 2017-10-15: qty 1

## 2017-10-15 MED ORDER — METHOCARBAMOL 1000 MG/10ML IJ SOLN
1000.0000 mg | Freq: Once | INTRAVENOUS | Status: AC
  Administered 2017-10-15: 1000 mg via INTRAVENOUS
  Filled 2017-10-15: qty 10

## 2017-10-15 MED ORDER — SODIUM CHLORIDE 0.9 % IV BOLUS
1000.0000 mL | Freq: Once | INTRAVENOUS | Status: AC
  Administered 2017-10-15: 1000 mL via INTRAVENOUS

## 2017-10-15 MED ORDER — HYDROCODONE-ACETAMINOPHEN 5-325 MG PO TABS: 2.0000 | ORAL_TABLET | ORAL | 0 refills | Status: DC | PRN

## 2017-10-15 MED ORDER — GADOBENATE DIMEGLUMINE 529 MG/ML IV SOLN
20.0000 mL | Freq: Once | INTRAVENOUS | Status: AC | PRN
  Administered 2017-10-15: 16 mL via INTRAVENOUS

## 2017-10-15 MED ORDER — METOPROLOL TARTRATE 25 MG PO TABS
25.0000 mg | ORAL_TABLET | Freq: Once | ORAL | Status: AC
  Administered 2017-10-15: 25 mg via ORAL
  Filled 2017-10-15: qty 1

## 2017-10-15 NOTE — ED Notes (Signed)
ED Provider at bedside. 

## 2017-10-15 NOTE — ED Notes (Signed)
MRI transporter here. He is transported from our unit without incident.

## 2017-10-15 NOTE — ED Notes (Signed)
Bed: WA17 Expected date:  Expected time:  Means of arrival:  Comments: EMS-altered

## 2017-10-15 NOTE — ED Provider Notes (Signed)
[facility]-EMERGENCY DEPT Provider Note   CSN: [id number] Arrival date & time: 10/15/17  1191     History   Chief Complaint Chief Complaint  Patient presents with  . Altered Mental Status    HPI Jeremy Gill is a 71 y.o. male.  Complaint is lethargic yesterday, back pain today.  HPI: Patient presents accompanied by his wife assisted by EMS.  Ultimately after his wife arrived he was able to determine that he was admitted to the hospital last week for 2 days with encephalopathy and a pneumonia.  He completed treatment.  He was doing well until yesterday.  His wife states that he was "lethargic".  She assumes that this is because he did not sleep well the night before.  However he was sleeping all day and was not able to take his medications.  He awakened this morning early.  Clear minded.  However he was worsening with pain and having difficulty getting around because of his pain and wife brings him here  No headache.  No fever.  No cough or difficulty breathing.  No abdominal pain.  No change in bowel or bladder habits.  Ambulatory but complaining of pain  Past Medical History:  Diagnosis Date  . ANKLE SPRAIN 12/16/2006   Qualifier: Diagnosis of  By: Tawanna Cooler MD, Eugenio Hoes   . ANXIETY 10/25/2006  . CELLULITIS, LEG, RIGHT 09/08/2007   Qualifier: Diagnosis of  By: Tawanna Cooler MD, Eugenio Hoes   . Chronic pain disorder 11/30/2015  . COPD 10/25/2006  . 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 DIABETES MELLITUS, TYPE I 10/25/2006  . Diabetic polyneuropathy (HCC) 04/08/2007   Qualifier: Diagnosis of  By: Tawanna Cooler MD, Eugenio Hoes   . 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 Essential hypertension 10/25/2006   Qualifier: Diagnosis of  By: Rosette Reveal RN, Jorene Minors   . Generalized osteoarthritis of multiple sites 11/30/2015  . HYPERTENSION 10/25/2006  . Hypertrophy of tongue papillae 11/11/2007   Qualifier:  Diagnosis of  By: Tawanna Cooler MD, Eugenio Hoes   . Low back pain with radiation 01/23/2016  . Polyneuropathy in diabetes(357.2) 04/08/2007  . Psoriasis   . TOBACCO ABUSE 07/08/2007  . VENOUS INSUFFICIENCY 11/25/2007    Patient Active Problem List   Diagnosis Date Noted  . Pressure injury of skin 10/02/2017  . Anemia   . Depression 09/30/2017  . Acute renal failure superimposed on stage 3 chronic kidney disease (HCC) 09/30/2017  . Sepsis (HCC) 09/30/2017  . Hypokalemia 09/30/2017  . Hyperlipidemia associated with type 2 diabetes mellitus (HCC) 02/05/2017  . AKI (acute kidney injury) (HCC) 12/19/2016  . Acute metabolic encephalopathy 12/19/2016  . DKA (diabetic ketoacidoses) (HCC) 12/18/2016  . Hyperkalemia 12/18/2016  . Anxiety disorder, unspecified 02/08/2016  . Low back pain with radiation 01/23/2016  . Chronic pain disorder 11/30/2015  . Generalized osteoarthritis of multiple sites 11/30/2015  . DYSHIDROSIS 06/08/2008  . VENOUS INSUFFICIENCY 11/25/2007  . 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 TOBACCO ABUSE 07/08/2007  . Diabetic polyneuropathy (HCC) 04/08/2007  . 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 Essential hypertension 10/25/2006  . COPD (chronic obstructive pulmonary disease) (HCC) 10/25/2006    Past Surgical History:  Procedure Laterality Date  . TESTICLE REMOVAL     R testicle        Home Medications    Prior to Admission medications   Medication Sig Start Date End  Date Taking? Authorizing Provider  aspirin 81 MG chewable tablet Chew 81 mg by mouth 2 (two) times a week. Pt takes on Monday and Friday.   Yes [provider]  atorvastatin (LIPITOR) 40 MG tablet Take 1 tablet (40 mg total) by mouth daily. 11/26/16 10/15/17 Yes Dunn, Dayna N, PA-C  baclofen (LIORESAL) 10 MG tablet TAKE 1 TABLET BY MOUTH 3 TIMES DAILY 09/03/17  Yes Marcello Fennel, MD  busPIRone (BUSPAR) 15 MG tablet TAKE 2 TABLETS BY MOUTH 2 TIMES DAILY  06/07/17  Yes [place], [name], MD  collagenase (SANTYL) ointment Apply 1 application topically daily. 09/16/17  Yes Felecia Shelling, DPM  DULoxetine (CYMBALTA) 60 MG capsule TAKE 1 CAPSULE BY MOUTH EVERY DAY 07/01/17  Yes Marcello Fennel, MD  fluocinonide cream (LIDEX) 0.05 % Apply topically 3 (three) times daily as needed. For itching Patient taking differently: Apply 1 application topically 3 (three) times daily as needed (for itching).  12/23/14  Yes Roderick Pee, MD  gabapentin (NEURONTIN) 600 MG tablet Take 1 tablet (600 mg total) by mouth 3 (three) times daily. 10/04/17  Yes Sheikh, Omair Latif, DO  Insulin Glargine (LANTUS SOLOSTAR) 100 UNIT/ML Solostar Pen Inject 20 Units into the skin daily at 10 pm. 10/04/17  Yes Sheikh, Omair Latif, DO  ipratropium-albuterol (DUONEB) 0.5-2.5 (3) MG/3ML SOLN Take 3 mLs by nebulization every 6 (six) hours as needed. 10/04/17  Yes Sheikh, Omair Latif, DO  metFORMIN (GLUCOPHAGE) 500 MG tablet Take 2 tablets (1,000 mg total) by mouth 2 (two) times daily with a meal. 04/21/17  Yes [place], [name], MD  metoprolol succinate (TOPROL-XL) 25 MG 24 hr tablet Take 0.5 tablets (12.5 mg total) by mouth daily. 02/06/17  Yes [place], [name], MD  traMADol (ULTRAM) 50 MG tablet TAKE 1 TABLET BY MOUTH EVERY 6 HOURS as needed for pain 10/09/17  Yes Marcello Fennel, MD  triamcinolone ointment (KENALOG) 0.1 % Apply topically 2 (two) times daily. 1:1 compound with Eucerin Patient taking differently: Apply 1 application topically 2 (two) times daily as needed (rash). Compounded as a 1:1 mixture with Eucerin. 12/23/14  Yes Roderick Pee, MD  cefdinir (OMNICEF) 300 MG capsule Take 1 capsule (300 mg total) by mouth every 12 (twelve) hours. Patient not taking: Reported on 10/15/2017 10/04/17   Marguerita Merles Latif, DO  glucose blood test strip Use to test blood sugar three-four times daily. 02/06/17   [place], [name], MD  guaiFENesin (MUCINEX) 600 MG 12 hr tablet Take 2 tablets (1,200 mg  total) by mouth 2 (two) times daily. Patient not taking: Reported on 10/15/2017 10/04/17   Marguerita Merles Latif, DO  HYDROcodone-acetaminophen (NORCO/VICODIN) 5-325 MG tablet Take 2 tablets by mouth every 4 (four) hours as needed. 10/15/17   Rolland Porter, MD  Insulin Pen Needle (PEN NEEDLES) 32G X 4 MM MISC Inject 1 application into the skin as directed. USE WITH INSULIN PEN 08/06/17   [place], [name], MD  nicotine (NICODERM CQ - DOSED IN MG/24 HOURS) 21 mg/24hr patch Place 1 patch (21 mg total) onto the skin daily. Patient not taking: Reported on 10/07/2017 10/05/17   Merlene Laughter, DO    Family History Family History  Problem Relation Age of Onset  . Cancer Mother        Unknown   . Diabetes Sister     Social History Social History   Tobacco Use  . Smoking status: Current Every Day Smoker    Types: Pipe  Last attempt to quit: 05/29/2011    Years since quitting: 6.3  . Smokeless tobacco: Never Used  Substance Use Topics  . Alcohol use: No  . Drug use: No     Allergies   Patient has no known allergies.   Review of Systems Review of Systems  Constitutional: Positive for appetite change. Negative for chills, diaphoresis, fatigue and fever.  HENT: Negative for mouth sores, sore throat and trouble swallowing.   Eyes: Negative for visual disturbance.  Respiratory: Negative for cough, chest tightness, shortness of breath and wheezing.   Cardiovascular: Negative for chest pain.  Gastrointestinal: Negative for abdominal distention, abdominal pain, diarrhea, nausea and vomiting.  Endocrine: Negative for polydipsia, polyphagia and polyuria.  Genitourinary: Negative for dysuria, frequency and hematuria.  Musculoskeletal: Positive for back pain. Negative for gait problem.  Skin: Negative for color change, pallor and rash.  Neurological: Negative for dizziness, syncope, light-headedness and headaches.  Hematological: Does not bruise/bleed easily.  Psychiatric/Behavioral: Negative  for behavioral problems and confusion.     Physical Exam Updated Vital Signs BP (!) 145/84 (BP Location: Right Arm)   Pulse (!) 102   Temp 98.3 F (36.8 C) (Oral)   Resp 10   SpO2 97%   Physical Exam  Constitutional: He is oriented to person, place, and time. He appears well-developed and well-nourished. No distress.  Awake and alert.  Able to answer simple questioning.  Relies on his wife for a majority of his history.  Does not appear acutely ill or distressed  HENT:  Head: Normocephalic.  Eyes: Pupils are equal, round, and reactive to light. Conjunctivae are normal. No scleral icterus.  Neck: Normal range of motion. Neck supple. No thyromegaly present.  Cardiovascular: Normal rate and regular rhythm. Exam reveals no gallop and no friction rub.  No murmur heard. Pulmonary/Chest: Effort normal and breath sounds normal. No respiratory distress. He has no wheezes. He has no rales.  No increased work of breathing or abnormal breath sounds.  97% saturations  Abdominal: Soft. Bowel sounds are normal. He exhibits no distension. There is no tenderness. There is no rebound.  Musculoskeletal: Normal range of motion.  Normal appearance of his back.  No rash or zoster.  Has normal strength and use of his extremities.  Neurological: He is alert and oriented to person, place, and time.  Skin: Skin is warm and dry. No rash noted.  Psychiatric: He has a normal mood and affect. His behavior is normal.     ED Treatments / Results  Labs (all labs ordered are listed, but only abnormal results are displayed) Labs Reviewed  COMPREHENSIVE METABOLIC PANEL - Abnormal; Notable for the following components:      Result Value   Potassium 3.3 (*)    Chloride 100 (*)    Glucose, Bld 201 (*)    BUN 34 (*)    Creatinine, Ser 1.34 (*)    Total Protein 8.2 (*)    GFR calc non Af Amer 51 (*)    GFR calc Af Amer 59 (*)    All other components within normal limits  CBC - Abnormal; Notable for the  following components:   WBC 18.8 (*)    RBC 3.96 (*)    Hemoglobin 12.0 (*)    HCT 35.3 (*)    All other components within normal limits  URINALYSIS, ROUTINE W REFLEX MICROSCOPIC - Abnormal; Notable for the following components:   Glucose, UA 150 (*)    Hgb urine dipstick SMALL (*)  Protein, ur 30 (*)    All other components within normal limits  AMMONIA - Abnormal; Notable for the following components:   Ammonia <9 (*)    All other components within normal limits  CBG MONITORING, ED - Abnormal; Notable for the following components:   Glucose-Capillary 222 (*)    All other components within normal limits  I-STAT CG4 LACTIC ACID, ED    EKG None  Radiology Ct Head Wo Contrast  Result Date: 10/15/2017 CLINICAL DATA:  Larey Seat hitting the floor, history of chronic back pain, altered level of consciousness EXAM: CT HEAD WITHOUT CONTRAST TECHNIQUE: Contiguous axial images were obtained from the base of the skull through the vertex without intravenous contrast. COMPARISON:  MR brain of 10/01/2017 and CT brain of 09/30/2017 . FINDINGS: Brain: The ventricular system remains prominent, as are the cortical sulci consistent with diffuse atrophy. The septum is in a normal midline position. Moderately severe small vessel ischemic changes again noted throughout the periventricular white matter with small lacunar infarcts appearing old. No hemorrhage, mass lesion, or acute infarction is seen Vascular: No vascular abnormality is noted on this unenhanced study. Skull: On bone window images no calvarial abnormality is seen. Sinuses/Orbits: No evidence of sinusitis is seen. Small retention cyst is noted medially within the right maxillary sinus. Other: None. IMPRESSION: 1. Stable atrophy and somewhat advanced small vessel ischemic change. No acute intracranial abnormality. 2. Small retention cyst within the right maxillary sinus. No sinusitis. Electronically Signed   By: Dwyane Dee M.D.   On: 10/15/2017 11:06     Mr Thoracic Spine W Wo Contrast  Result Date: 10/15/2017 CLINICAL DATA:  Fall with back pain. EXAM: MRI THORACIC AND LUMBAR SPINE WITHOUT AND WITH CONTRAST TECHNIQUE: Multiplanar and multiecho pulse sequences of the thoracic and lumbar spine were obtained without and with intravenous contrast. CONTRAST:  16mL MULTIHANCE GADOBENATE DIMEGLUMINE 529 MG/ML IV SOLN COMPARISON:  None. FINDINGS: The study is degraded by motion, despite efforts to reduce this artifact, including utilization of motion-resistant MR sequences. The findings of the study are interpreted in the context of reduced sensitivity/specificity. MRI THORACIC SPINE FINDINGS Alignment:  Physiologic. Vertebrae: T12 hemangioma.  No acute fracture. 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 The syrinx measures 7 mm AP x 4 mm transverse x 22 mm craniocaudal. Spinal cord is otherwise normal. Paraspinal and other soft tissues: No pleural effusion or descending thoracic aortic aneurysm. Disc levels: Small central disc protrusion at T6-7. No stenosis. No other disc herniation. No abnormal contrast enhancement. MRI LUMBAR SPINE FINDINGS Segmentation:  Standard. Alignment:  Physiologic. Vertebrae: Diffusely heterogeneous bone marrow signal. No fracture. Conus medullaris: Extends to the L1 level and appears normal. Paraspinal and other soft tissues: Normal Disc levels: There is disc space narrowing at L5-S1 but no focal herniation. No spinal canal or neural foraminal stenosis throughout the lumbar spine. No abnormal contrast enhancement. IMPRESSION: 1. No acute injury of the thoracic or lumbar spine. 2. 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 No associated contrast enhancement. MRI of the cervical spine should be considered for complete characterization. 3. No thoracic or lumbar spinal canal or neural foraminal stenosis. 4. Nonspecific heterogeneous bone marrow signal of the  lumbar spine without contrast enhancement. This is frequently seen in the setting of chronic anemia or in patients with a smoking history. Electronically Signed   By: Deatra Robinson M.D.   On: 10/15/2017  14:08   Mr Lumbar Spine W Wo Contrast  Result Date: 10/15/2017 CLINICAL DATA:  Fall with back pain. EXAM: MRI THORACIC AND LUMBAR SPINE WITHOUT AND WITH CONTRAST TECHNIQUE: Multiplanar and multiecho pulse sequences of the thoracic and lumbar spine were obtained without and with intravenous contrast. CONTRAST:  16mL MULTIHANCE GADOBENATE DIMEGLUMINE 529 MG/ML IV SOLN COMPARISON:  None. FINDINGS: The study is degraded by motion, despite efforts to reduce this artifact, including utilization of motion-resistant MR sequences. The findings of the study are interpreted in the context of reduced sensitivity/specificity. MRI THORACIC SPINE FINDINGS Alignment:  Physiologic. Vertebrae: T12 hemangioma.  No acute fracture. 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 The syrinx measures 7 mm AP x 4 mm transverse x 22 mm craniocaudal. Spinal cord is otherwise normal. Paraspinal and other soft tissues: No pleural effusion or descending thoracic aortic aneurysm. Disc levels: Small central disc protrusion at T6-7. No stenosis. No other disc herniation. No abnormal contrast enhancement. MRI LUMBAR SPINE FINDINGS Segmentation:  Standard. Alignment:  Physiologic. Vertebrae: Diffusely heterogeneous bone marrow signal. No fracture. Conus medullaris: Extends to the L1 level and appears normal. Paraspinal and other soft tissues: Normal Disc levels: There is disc space narrowing at L5-S1 but no focal herniation. No spinal canal or neural foraminal stenosis throughout the lumbar spine. No abnormal contrast enhancement. IMPRESSION: 1. No acute injury of the thoracic or lumbar spine. 2. 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 No associated contrast  enhancement. MRI of the cervical spine should be considered for complete characterization. 3. No thoracic or lumbar spinal canal or neural foraminal stenosis. 4. Nonspecific heterogeneous bone marrow signal of the lumbar spine without contrast enhancement. This is frequently seen in the setting of chronic anemia or in patients with a smoking history. Electronically Signed   By: Deatra Robinson M.D.   On: 10/15/2017 14:08   Dg Chest Port 1 View  Result Date: 10/15/2017 CLINICAL DATA:  Cough and confusion.  Recent pneumonia. EXAM: PORTABLE CHEST 1 VIEW COMPARISON:  Chest x-ray dated Oct 02, 2017. FINDINGS: The heart size and mediastinal contours are within normal limits. Normal pulmonary vascularity. The lungs remain hyperinflated with emphysematous changes. No focal consolidation, pleural effusion, or pneumothorax. No acute osseous abnormality. IMPRESSION: COPD.  No active disease. Electronically Signed   By: Obie Dredge M.D.   On: 10/15/2017 10:23    Procedures Procedures (including critical care time)  Medications Ordered in ED Medications  methocarbamol (ROBAXIN) 1,000 mg in dextrose 5 % 50 mL IVPB (0 mg Intravenous Stopped 10/15/17 1227)  HYDROmorphone (DILAUDID) injection 0.5 mg (0.5 mg Intravenous Given 10/15/17 1141)  sodium chloride 0.9 % bolus 1,000 mL (0 mLs Intravenous Stopped 10/15/17 1227)  gadobenate dimeglumine (MULTIHANCE) injection 20 mL (16 mLs Intravenous Contrast Given 10/15/17 1331)  metoprolol tartrate (LOPRESSOR) tablet 25 mg (25 mg Oral Given 10/15/17 1429)     Initial Impression / Assessment and Plan / ED Course  I have reviewed the triage vital signs and the nursing notes.  Pertinent labs & imaging results that were available during my care of the patient were reviewed by me and considered in my medical decision making (see chart for details).    Complete, the patient had a day yesterday where he was somnolent and did not take medications.  He has cleared today but has  back pain.  He has chronic back pain for which he takes  multiple medications and is followed by Dr. Allena Katz.  Is afebrile.  He does have leukocytosis.  He has a mild tachycardia but has not taken his Lopressor yesterday or today.  This resolved after this was given.  Normal lactate.  Normal rectal temperature.  Urine does not appear infected.  Changes on chest x-ray have resolved.  Did occur to me that he had an episode of confusion was hospitalized and treated with IV antibiotics for pneumonia improved and now has worsening back pain.  Consideration given for possibility of epidural abscess.  MRI was obtained and shows syrinx, but no acute findings.  No cord compression, acute changes, or EDA.  He is feeling well after some IV pain medication.  I will discharge him home.  Return here with changes.  On days that he has worsening pain of asked him to hold tramadol and a number 10 tablet prescription for hydrocodone given.  Of asked him to relate to his pain management physician Dr. Allena Katz that this change was made, lest we violate his pain contract.  Final Clinical Impressions(s) / ED Diagnoses   Final diagnoses:  Chronic midline low back pain without sciatica    ED Discharge Orders        Ordered    HYDROcodone-acetaminophen (NORCO/VICODIN) 5-325 MG tablet  Every 4 hours PRN     10/15/17 1439       Rolland Porter, MD 10/15/17 1443

## 2017-10-15 NOTE — ED Triage Notes (Signed)
Patient arrived via [facility] from home.patient called out for lower back pain. Patient had a controlled slip onto the floor. Wife was trying to change pad under neath him. Patient has a hx. Of chronic back pain. Pain has only been significant for 3 months. Patient was discharged from hospital about 11 days ago.Patient wife states that patient has not been his mornal since discharged from the hospital. Patient that been lethargic, slow to respond, and poor nutrition intake. A/Ox3.  Hx. Of DM.

## 2017-10-15 NOTE — Discharge Instructions (Addendum)
Continue your current medications as they are prescribed at the current dosage. If you have a day or you have more pain over the next few days, you can stop your Ultram, and take the hydrocodone as prescribed. When you have your follow-up appointment with Dr. Allena Katz, please inform him that this change was made.

## 2017-10-15 NOTE — Progress Notes (Deleted)
HPI:   [name] is a 72 y.o. male, who is here today to follow on recent hospitalization.   He was admitted on 09/30/2017 and discharged on 10/04/2017. SNF were recommended but patient declined, so he was discharged home. He presented to the ER with MS changes. He initially he was barely arousable and disoriented x3, no focal deficit.  Discharge diagnoses: Acute metabolic encephalopathy, acute renal failure superimposed on a stage III CKD, hypokalemia, sepsis. Suspected community-acquired pneumonia, he was afebrile with leukocytosis. Procalcitonin level was elevated and improved after antibiotic treatment. Blood cultures x 2 no growth. CXR showed bilateral lung base opacities. He was treated with IV ceftriaxone and azithromycin, changed to Ceftin ear and p.o. azithromycin. He was discharged on DuoNeb treatments, incentive spirometry was recommended.  Acute metabolic encephalopathy with some component of delirium, possibly caused by sepsis/CAP vs narcotic OD.  MS improved slightly after Narcan was given. Head CT negative for acute intracranial abnormality. Brain MRI negative for any acute changes.  AKI on CKD 2, which improved after IV hydration.  Lab Results  Component Value Date   CREATININE 0.83 10/04/2017   BUN 13 10/04/2017   NA 141 10/04/2017   K 3.5 10/04/2017   CL 106 10/04/2017   CO2 24 10/04/2017    Lab Results  Component Value Date   WBC 10.9 (H) 10/04/2017   HGB 10.2 (L) 10/04/2017   HCT 30.4 (L) 10/04/2017   MCV 88.6 10/04/2017   PLT 273 10/04/2017   DM 2: Because of episodes of hypoglycemia, Victoza was discontinued, Lantus dose was decreased to 20 units daily.  Lab Results  Component Value Date   HGBA1C 10.0 08/20/2017    Depression/anxiety Cymbalta 60 mg buspirone 30 mg twice daily were continued. Amitriptyline was discontinued.     Review of Systems    Current Outpatient Medications on File Prior to Visit  Medication Sig  Dispense Refill  . aspirin 81 MG chewable tablet Chew 81 mg by mouth 2 (two) times a week. Pt takes on Monday and Friday.    Marland Kitchen atorvastatin (LIPITOR) 40 MG tablet Take 1 tablet (40 mg total) by mouth daily. 90 tablet 3  . baclofen (LIORESAL) 10 MG tablet TAKE 1 TABLET BY MOUTH 3 TIMES DAILY 90 tablet 1  . busPIRone (BUSPAR) 15 MG tablet TAKE 2 TABLETS BY MOUTH 2 TIMES DAILY 360 tablet 1  . cefdinir (OMNICEF) 300 MG capsule Take 1 capsule (300 mg total) by mouth every 12 (twelve) hours. 7 capsule 0  . collagenase (SANTYL) ointment Apply 1 application topically daily. 30 g 1  . DULoxetine (CYMBALTA) 60 MG capsule TAKE 1 CAPSULE BY MOUTH EVERY DAY 30 capsule 2  . fluocinonide cream (LIDEX) 0.05 % Apply topically 3 (three) times daily as needed. For itching (Patient taking differently: Apply 1 application topically 3 (three) times daily as needed (for itching). ) 30 g 3  . gabapentin (NEURONTIN) 600 MG tablet Take 1 tablet (600 mg total) by mouth 3 (three) times daily. 90 tablet 0  . glucose blood test strip Use to test blood sugar three-four times daily. 400 each 3  . guaiFENesin (MUCINEX) 600 MG 12 hr tablet Take 2 tablets (1,200 mg total) by mouth 2 (two) times daily. 20 tablet 0  . Insulin Glargine (LANTUS SOLOSTAR) 100 UNIT/ML Solostar Pen Inject 20 Units into the skin daily at 10 pm. 15 mL 11  . Insulin Pen Needle (PEN NEEDLES) 32G X 4 MM MISC Inject 1  application into the skin as directed. USE WITH INSULIN PEN 100 each 1  . ipratropium-albuterol (DUONEB) 0.5-2.5 (3) MG/3ML SOLN Take 3 mLs by nebulization every 6 (six) hours as needed. (Patient not taking: Reported on 10/07/2017) 360 mL 0  . metFORMIN (GLUCOPHAGE) 500 MG tablet Take 2 tablets (1,000 mg total) by mouth 2 (two) times daily with a meal. 120 tablet 4  . metoprolol succinate (TOPROL-XL) 25 MG 24 hr tablet Take 0.5 tablets (12.5 mg total) by mouth daily. 90 tablet 1  . nicotine (NICODERM CQ - DOSED IN MG/24 HOURS) 21 mg/24hr patch Place  1 patch (21 mg total) onto the skin daily. (Patient not taking: Reported on 10/07/2017) 28 patch 0  . traMADol (ULTRAM) 50 MG tablet TAKE 1 TABLET BY MOUTH EVERY 6 HOURS as needed for pain 135 tablet 0  . triamcinolone ointment (KENALOG) 0.1 % Apply topically 2 (two) times daily. 1:1 compound with Eucerin (Patient taking differently: Apply 1 application topically 2 (two) times daily as needed (rash). Compounded as a 1:1 mixture with Eucerin.) 453.6 g 3   No current facility-administered medications on file prior to visit.      Past Medical History:  Diagnosis Date  . ANKLE SPRAIN 12/16/2006   Qualifier: Diagnosis of  By: Tawanna Cooler MD, Eugenio Hoes   . ANXIETY 10/25/2006  . CELLULITIS, LEG, RIGHT 09/08/2007   Qualifier: Diagnosis of  By: Tawanna Cooler MD, Eugenio Hoes   . Chronic pain disorder 11/30/2015  . COPD 10/25/2006  . 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 DIABETES MELLITUS, TYPE I 10/25/2006  . Diabetic polyneuropathy (HCC) 04/08/2007   Qualifier: Diagnosis of  By: Tawanna Cooler MD, Eugenio Hoes   . 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 Essential hypertension 10/25/2006   Qualifier: Diagnosis of  By: Rosette Reveal RN, Jorene Minors   . Generalized osteoarthritis of multiple sites 11/30/2015  . HYPERTENSION 10/25/2006  . 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 Low back pain with radiation 01/23/2016  . Polyneuropathy in diabetes(357.2) 04/08/2007  . Psoriasis   . TOBACCO ABUSE 07/08/2007  . VENOUS INSUFFICIENCY 11/25/2007   No Known Allergies  Social History   Socioeconomic History  . Marital status: Married    Spouse name: Not on file  . Number of children: Not on file  . Years of education: Not on file  . Highest education level: Not on file  Occupational History  . Occupation: Self Employed  Social Needs  . Financial resource strain: Not on file  . Food insecurity:     Worry: Not on file    Inability: Not on file  . Transportation needs:    Medical: Not on file    Non-medical: Not on file  Tobacco Use  . Smoking status: Current Every Day Smoker    Types: Pipe    Last attempt to quit: 05/29/2011    Years since quitting: 6.3  . Smokeless tobacco: Never Used  Substance and Sexual Activity  . Alcohol use: No  . Drug use: No  . Sexual activity: Not on file  Lifestyle  . Physical activity:    Days per week: Not on file    Minutes per session: Not on file  . Stress: Not on file  Relationships  . Social connections:    Talks on phone: Not on file    Gets together: Not on file  Attends religious service: Not on file    Active member of club or organization: Not on file    Attends meetings of clubs or organizations: Not on file    Relationship status: Not on file  Other Topics Concern  . Not on file  Social History Narrative   Regular exercise-no    There were no vitals filed for this visit. There is no height or weight on file to calculate BMI.      Physical Exam  ASSESSMENT AND PLAN:  There are no diagnoses linked to this encounter.   No orders of the defined types were placed in this encounter.   No problem-specific Assessment & Plan notes found for this encounter.      Ryken Paschal G. [place], MD  [facility]. [place] office.

## 2017-10-15 NOTE — ED Notes (Addendum)
Condom catheter placed and patient aware we need a urine sample. Patient will try before we catheterize with in/out.

## 2017-10-17 ENCOUNTER — Other Ambulatory Visit: Payer: Self-pay | Admitting: Physical Medicine & Rehabilitation

## 2017-10-17 ENCOUNTER — Inpatient Hospital Stay: Payer: PPO | Admitting: Family Medicine

## 2017-10-25 ENCOUNTER — Encounter: Payer: PPO | Admitting: Physical Medicine & Rehabilitation

## 2017-10-25 ENCOUNTER — Ambulatory Visit: Payer: PPO | Admitting: Physical Medicine & Rehabilitation

## 2017-10-30 ENCOUNTER — Ambulatory Visit: Payer: PPO | Admitting: Podiatry

## 2017-11-01 ENCOUNTER — Encounter: Payer: PPO | Attending: Physical Medicine & Rehabilitation | Admitting: Physical Medicine & Rehabilitation

## 2017-11-01 ENCOUNTER — Ambulatory Visit: Payer: PPO

## 2017-11-01 ENCOUNTER — Other Ambulatory Visit: Payer: Self-pay

## 2017-11-01 ENCOUNTER — Encounter: Payer: Self-pay | Admitting: Physical Medicine & Rehabilitation

## 2017-11-01 VITALS — BP 129/79 | HR 92 | Ht 72.0 in | Wt 164.0 lb

## 2017-11-01 DIAGNOSIS — G479 Sleep disorder, unspecified: Secondary | ICD-10-CM | POA: Diagnosis not present

## 2017-11-01 DIAGNOSIS — I872 Venous insufficiency (chronic) (peripheral): Secondary | ICD-10-CM | POA: Diagnosis not present

## 2017-11-01 DIAGNOSIS — G8929 Other chronic pain: Secondary | ICD-10-CM

## 2017-11-01 DIAGNOSIS — Z79899 Other long term (current) drug therapy: Secondary | ICD-10-CM | POA: Diagnosis not present

## 2017-11-01 DIAGNOSIS — I10 Essential (primary) hypertension: Secondary | ICD-10-CM | POA: Insufficient documentation

## 2017-11-01 DIAGNOSIS — E1142 Type 2 diabetes mellitus with diabetic polyneuropathy: Secondary | ICD-10-CM

## 2017-11-01 DIAGNOSIS — R269 Unspecified abnormalities of gait and mobility: Secondary | ICD-10-CM

## 2017-11-01 DIAGNOSIS — G894 Chronic pain syndrome: Secondary | ICD-10-CM | POA: Diagnosis not present

## 2017-11-01 DIAGNOSIS — M545 Low back pain, unspecified: Secondary | ICD-10-CM

## 2017-11-01 DIAGNOSIS — E538 Deficiency of other specified B group vitamins: Secondary | ICD-10-CM | POA: Insufficient documentation

## 2017-11-01 DIAGNOSIS — F17290 Nicotine dependence, other tobacco product, uncomplicated: Secondary | ICD-10-CM | POA: Diagnosis not present

## 2017-11-01 DIAGNOSIS — M159 Polyosteoarthritis, unspecified: Secondary | ICD-10-CM | POA: Insufficient documentation

## 2017-11-01 DIAGNOSIS — L409 Psoriasis, unspecified: Secondary | ICD-10-CM | POA: Insufficient documentation

## 2017-11-01 DIAGNOSIS — Z833 Family history of diabetes mellitus: Secondary | ICD-10-CM | POA: Diagnosis not present

## 2017-11-01 DIAGNOSIS — J449 Chronic obstructive pulmonary disease, unspecified: Secondary | ICD-10-CM | POA: Diagnosis not present

## 2017-11-01 DIAGNOSIS — F419 Anxiety disorder, unspecified: Secondary | ICD-10-CM | POA: Insufficient documentation

## 2017-11-01 MED ORDER — DICLOFENAC SODIUM 50 MG PO TBEC: 50.0000 mg | DELAYED_RELEASE_TABLET | Freq: Three times a day (TID) | ORAL | 1 refills | Status: DC

## 2017-11-01 NOTE — Progress Notes (Addendum)
Subjective:    Patient ID: Jeremy Gill, male    DOB: [birth year], 72 y.o.   MRN: [id number]  HPI  72 y/o male with pmh of diabetic polyneuropathy, OA, chronic pain, tobacco abuse, HTN, COPD presents follow up chronic pain >low back, midline.   Initially, pt stated it started 11/26/15 denies inciting event.  Getting progressively worse.  Pain meds improve the pain. No pain meds alleviate the pain.  Mostly dull pain. Non-radiating.  Constant.  He saw Ortho spine for evaluation of surgery.  He was referred to PT, which he completed.  It helped a little.  He exercises daily. He tried hydrocodone for 12 years, but it was stopped by her PCP.  Denies associated weakness and numbness.  He has fallen twice in the year prior to eval because he was not using his cane.  Pain limits pt from doing "normal things".    Last clinic visit 07/18/17.  Wife supplements history.  Since that time, pt has been to the ED for sepsis and AMS, medications were adjusted, notes reviewed.  Pt has not joined the gym.  Denies falls. He started taking Vit B12 supplementation.    Pain Inventory Average Pain 8 Pain Right Now 8 My pain is constant, sharp, burning, dull, stabbing and aching  In the last 24 hours, has pain interfered with the following? General activity 8 Relation with others 8 Enjoyment of life 10 What TIME of day is your pain at its worst? morning and daytime Sleep (in general) Fair  Pain is worse with: some activites Pain improves with: medication Relief from Meds: 5  Mobility use a cane ability to climb steps?  yes do you drive?  yes  Function retired  Neuro/Psych No problems in this area trouble walking  Prior Studies Any changes since last visit?  no   Physicians involved in care Any changes since last visit?  no   Family History  Problem Relation Age of Onset  . Cancer Mother        Unknown   . Diabetes Sister    Social History   Socioeconomic History  . Marital status:  Married    Spouse name: Not on file  . Number of children: Not on file  . Years of education: Not on file  . Highest education level: Not on file  Occupational History  . Occupation: Self Employed  Social Needs  . Financial resource strain: Not on file  . Food insecurity:    Worry: Not on file    Inability: Not on file  . Transportation needs:    Medical: Not on file    Non-medical: Not on file  Tobacco Use  . Smoking status: Current Every Day Smoker    Types: Pipe    Last attempt to quit: 05/29/2011    Years since quitting: 6.4  . Smokeless tobacco: Never Used  Substance and Sexual Activity  . Alcohol use: No  . Drug use: No  . Sexual activity: Not on file  Lifestyle  . Physical activity:    Days per week: Not on file    Minutes per session: Not on file  . Stress: Not on file  Relationships  . Social connections:    Talks on phone: Not on file    Gets together: Not on file    Attends religious service: Not on file    Active member of club or organization: Not on file    Attends meetings of clubs or organizations: Not  on file    Relationship status: Not on file  Other Topics Concern  . Not on file  Social History Narrative   Regular exercise-no   Past Surgical History:  Procedure Laterality Date  . TESTICLE REMOVAL     R testicle   Past Medical History:  Diagnosis Date  . ANKLE SPRAIN 12/16/2006   Qualifier: Diagnosis of  By: Tawanna Coolerodd MD, Eugenio HoesJeffrey A   . ANXIETY 10/25/2006  . CELLULITIS, LEG, RIGHT 09/08/2007   Qualifier: Diagnosis of  By: Tawanna Coolerodd MD, Eugenio HoesJeffrey A   . Chronic pain disorder 11/30/2015  . COPD 10/25/2006  . 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 DIABETES MELLITUS, TYPE I 10/25/2006  . Diabetic polyneuropathy (HCC) 04/08/2007   Qualifier: Diagnosis of  By: Tawanna Coolerodd MD, Eugenio HoesJeffrey A   . 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 Essential hypertension 10/25/2006   Qualifier: Diagnosis of   By: Rosette Revealempleton, RN, Jorene MinorsAngela Dawn   . Generalized osteoarthritis of multiple sites 11/30/2015  . HYPERTENSION 10/25/2006  . 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 Low back pain with radiation 01/23/2016  . Polyneuropathy in diabetes(357.2) 04/08/2007  . Psoriasis   . TOBACCO ABUSE 07/08/2007  . VENOUS INSUFFICIENCY 11/25/2007   BP 129/79   Pulse 92   Ht 6' (1.829 m) Comment: pt reported  Wt 164 lb (74.4 kg)   SpO2 97%   BMI 22.24 kg/m   Opioid Risk Score:   Fall Risk Score:  `1  Depression screen PHQ 2/9  [facility] 11/01/2017 12/13/2016 10/30/2016 02/16/2016 02/08/2016 01/19/2014  Decreased Interest 1 0 (No Data) 2 0 0  Down, Depressed, Hopeless 1 0 0 1 0 0  PHQ - 2 Score 2 0 0 3 0 0  Altered sleeping - - - 1 - -  Tired, decreased energy - - - 2 - -  Change in appetite - - - 1 - -  Feeling bad or failure about yourself  - - - 0 - -  Trouble concentrating - - - 0 - -  Moving slowly or fidgety/restless - - - 0 - -  Suicidal thoughts - - - 0 - -  PHQ-9 Score - - - 7 - -  Difficult doing work/chores - - - Somewhat difficult - -    Review of Systems  Constitutional: Negative.  Negative for chills and fever.  HENT: Negative.   Eyes: Negative.   Respiratory: Negative.   Cardiovascular: Negative.   Gastrointestinal: Negative.   Endocrine:       High blood sugar    Genitourinary: Negative.   Musculoskeletal: Negative.        Spasms   Skin: Negative.   Allergic/Immunologic: Negative.   Neurological: Negative.  Negative for dizziness.       Tingling    Hematological: Negative.   Psychiatric/Behavioral: Negative.   All other systems reviewed and are negative.     Objective:   Physical Exam Gen: NAD. Vital signs reviewed HENT: Normocephalic, Atraumatic Eyes: EOMI. No discharge.  Cardio: RRR. No JVD.  Pulm: B/l clear to auscultation.  Effort normal Abd: Soft, BS+ MSK:  Gait antalgic with cane.   Mild  TTP R>L thoracolumbar PSPs.    No edema.  Neuro:    Strength  4+/5 in all LE myotomes  Skin: Warm and Dry.  Intact. Psych: Normal mood and affect.  Assessment & Plan:  72 y/o male with pmh of diabetic polyneuropathy, OA, chronic pain, tobacco abuse, HTN, COPD presents for follow up of chronic pain >low back, midline.   1.  Chronic mechanical low back pain  Xray L-spine reviewed, mild degenerative changes.   MRI 10/15/17 reviewed some L5-S1 disc space narrowing, otherwise relatively unremarkable for acute changes.  Syrinx noted, unclear chronicity.  Tylenol, TENS ineffective for pt  Mobic 15mg  d/ced due to lack of efficacy  Naproxen changed due to preference for TID medication  Robaxin changed due to cost  Pt completed PT, cont HEP.  Pt to join gym.  Cont ice  Cont Gabapentin to 600 TID  Cont Cymbalta to 60mg   Cont Tramadol 50 to 4/day (educated and discussed signs/symptoms serotonin syndrome and risk of seizures)  Cont Baclofen 10 TID due to cost  Cont Diclofenac 50 TID - pt believes he receives good benefit from this medication and would like to continue. Discussed need for hydration based on last lab values with NSAIDs.  Cont Lidoderm PRN  Will hold off on Elavil 10qhs  Encouraged physical activity  Patient believes previous pain regiment works well for him and would like to return  2. Diabetic polyneuropathy  Cont Gabapentin 600 TID  3. Gait abnormality  Cont cane for safety, pt states good support with current single point cane  Encouraged compliance with diabetic footware  4. Sleep disturbance  Will hold off on Elavil 10qhs  5. Vitamin B12 deficiency  Cont supplementation with daily

## 2017-11-01 NOTE — Addendum Note (Signed)
Addended by: Maryla MorrowPATEL, ANKIT A on: 11/01/2017 02:16 PM   Modules accepted: Level of Service

## 2017-11-11 ENCOUNTER — Encounter: Payer: Self-pay | Admitting: Podiatry

## 2017-11-11 ENCOUNTER — Ambulatory Visit (INDEPENDENT_AMBULATORY_CARE_PROVIDER_SITE_OTHER): Payer: PPO | Admitting: Podiatry

## 2017-11-11 DIAGNOSIS — I70235 Atherosclerosis of native arteries of right leg with ulceration of other part of foot: Secondary | ICD-10-CM | POA: Diagnosis not present

## 2017-11-11 DIAGNOSIS — L97412 Non-pressure chronic ulcer of right heel and midfoot with fat layer exposed: Secondary | ICD-10-CM

## 2017-11-11 DIAGNOSIS — E0843 Diabetes mellitus due to underlying condition with diabetic autonomic (poly)neuropathy: Secondary | ICD-10-CM

## 2017-11-13 ENCOUNTER — Other Ambulatory Visit: Payer: Self-pay | Admitting: Family Medicine

## 2017-11-18 NOTE — Progress Notes (Signed)
   Subjective:  72 year old male with PMHx of T1DM presenting today for follow up evaluation of an ulceration to the right heel. He states the wound is healing but very slowly. He has been using Santyl ointment with a bandage as directed. There are no modifying factors noted. Patient is here for further evaluation and treatment.   Past Medical History:  Diagnosis Date  . ANKLE SPRAIN 12/16/2006   Qualifier: Diagnosis of  By: Tawanna Coolerodd MD, Eugenio HoesJeffrey A   . ANXIETY 10/25/2006  . CELLULITIS, LEG, RIGHT 09/08/2007   Qualifier: Diagnosis of  By: Tawanna Coolerodd MD, Eugenio HoesJeffrey A   . Chronic pain disorder 11/30/2015  . COPD 10/25/2006  . 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 DIABETES MELLITUS, TYPE I 10/25/2006  . Diabetic polyneuropathy (HCC) 04/08/2007   Qualifier: Diagnosis of  By: Tawanna Coolerodd MD, Eugenio HoesJeffrey A   . 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 Essential hypertension 10/25/2006   Qualifier: Diagnosis of  By: Rosette Revealempleton, RN, Jorene MinorsAngela Dawn   . Generalized osteoarthritis of multiple sites 11/30/2015  . HYPERTENSION 10/25/2006  . 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 Low back pain with radiation 01/23/2016  . Polyneuropathy in diabetes(357.2) 04/08/2007  . Psoriasis   . TOBACCO ABUSE 07/08/2007  . VENOUS INSUFFICIENCY 11/25/2007      Objective/Physical Exam General: The patient is alert and oriented x3 in no acute distress.  Dermatology:  Wound #1 noted to the right heel measuring 1.5 x 1.0 x 0.2 cm (LxWxD).   To the noted ulceration(s), there is no eschar. There is a moderate amount of slough, fibrin, and necrotic tissue noted. Granulation tissue and wound base is red. There is a minimal amount of serosanguineous drainage noted. There is no exposed bone muscle-tendon ligament or joint. There is no malodor. Periwound integrity is intact. Skin is warm, dry and supple bilateral lower  extremities.  Vascular: Palpable pedal pulses bilaterally. No edema or erythema noted. Capillary refill within normal limits.  Neurological: Epicritic and protective threshold diminished bilaterally.   Musculoskeletal Exam: Range of motion within normal limits to all pedal and ankle joints bilateral. Muscle strength 5/5 in all groups bilateral.   Assessment: #1 ulceration noted to the right heel secondary to diabetes mellitus #2 diabetes mellitus w/ peripheral neuropathy   Plan of Care:  #1 Patient was evaluated. #2 Medically necessary excisional debridement including subcutaneous tissue was performed using a tissue nipper and a chisel blade. Excisional debridement of all the necrotic nonviable tissue down to healthy bleeding viable tissue was performed with post-debridement measurements same as pre-. #3 The wound was cleansed and dry sterile dressing applied. #4 Continue using Santyl ointment daily with a dry sterile dressing.  #5 Return to clinic in 3 weeks.    Felecia ShellingBrent M. Kayelyn Lemon, DPM [facility]  Dr. Felecia ShellingBrent M. Oswell Say, DPM    [address]                                        [place], [place] [address]                Office [phone]  Fax [phone]

## 2017-11-26 ENCOUNTER — Other Ambulatory Visit: Payer: Self-pay | Admitting: Family Medicine

## 2017-11-26 ENCOUNTER — Other Ambulatory Visit: Payer: Self-pay | Admitting: Physical Medicine & Rehabilitation

## 2017-11-26 DIAGNOSIS — E1149 Type 2 diabetes mellitus with other diabetic neurological complication: Secondary | ICD-10-CM

## 2017-12-04 ENCOUNTER — Ambulatory Visit (INDEPENDENT_AMBULATORY_CARE_PROVIDER_SITE_OTHER): Payer: PPO | Admitting: Podiatry

## 2017-12-04 DIAGNOSIS — L97412 Non-pressure chronic ulcer of right heel and midfoot with fat layer exposed: Secondary | ICD-10-CM

## 2017-12-04 DIAGNOSIS — I70235 Atherosclerosis of native arteries of right leg with ulceration of other part of foot: Secondary | ICD-10-CM | POA: Diagnosis not present

## 2017-12-04 DIAGNOSIS — E0843 Diabetes mellitus due to underlying condition with diabetic autonomic (poly)neuropathy: Secondary | ICD-10-CM

## 2017-12-05 ENCOUNTER — Encounter: Payer: PPO | Admitting: Physical Medicine & Rehabilitation

## 2017-12-08 NOTE — Progress Notes (Signed)
Subjective:  72 year old male with PMHx of T1DM presenting today for follow up evaluation of an ulceration to the right heel. He states he is doing well overall. He notes some increased soreness of the ulcer secondary to walking more excessively the past two weeks. He has been using Santyl daily with a dressing as directed. Patient is here for further evaluation and treatment.   Past Medical History:  Diagnosis Date  . ANKLE SPRAIN 12/16/2006   Qualifier: Diagnosis of  By: Tawanna Coolerodd MD, Eugenio HoesJeffrey A   . ANXIETY 10/25/2006  . CELLULITIS, LEG, RIGHT 09/08/2007   Qualifier: Diagnosis of  By: Tawanna Coolerodd MD, Eugenio HoesJeffrey A   . Chronic pain disorder 11/30/2015  . COPD 10/25/2006  . 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 DIABETES MELLITUS, TYPE I 10/25/2006  . Diabetic polyneuropathy (HCC) 04/08/2007   Qualifier: Diagnosis of  By: Tawanna Coolerodd MD, Eugenio HoesJeffrey A   . 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 Essential hypertension 10/25/2006   Qualifier: Diagnosis of  By: Rosette Revealempleton, RN, Jorene MinorsAngela Dawn   . Generalized osteoarthritis of multiple sites 11/30/2015  . HYPERTENSION 10/25/2006  . 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 Low back pain with radiation 01/23/2016  . Polyneuropathy in diabetes(357.2) 04/08/2007  . Psoriasis   . TOBACCO ABUSE 07/08/2007  . VENOUS INSUFFICIENCY 11/25/2007      Objective/Physical Exam General: The patient is alert and oriented x3 in no acute distress.  Dermatology:  Wound #1 noted to the right heel measuring 1.8 x 0.8 x 0.2 cm (LxWxD).   To the noted ulceration(s), there is no eschar. There is a moderate amount of slough, fibrin, and necrotic tissue noted. Granulation tissue and wound base is red. There is a minimal amount of serosanguineous drainage noted. There is no exposed bone muscle-tendon ligament or joint. There is no malodor. Periwound integrity is  intact. Skin is warm, dry and supple bilateral lower extremities.  Vascular: Palpable pedal pulses bilaterally. No edema or erythema noted. Capillary refill within normal limits.  Neurological: Epicritic and protective threshold diminished bilaterally.   Musculoskeletal Exam: Range of motion within normal limits to all pedal and ankle joints bilateral. Muscle strength 5/5 in all groups bilateral.   Assessment: #1 ulceration noted to the right heel secondary to diabetes mellitus #2 diabetes mellitus w/ peripheral neuropathy   Plan of Care:  #1 Patient was evaluated. #2 Medically necessary excisional debridement including subcutaneous tissue was performed using a tissue nipper and a chisel blade. Excisional debridement of all the necrotic nonviable tissue down to healthy bleeding viable tissue was performed with post-debridement measurements same as pre-. #3 The wound was cleansed and dry sterile dressing applied. #4 Continue using Santyl ointment daily with a dry sterile dressing.  #5 Continue wearing good shoe gear.  #6 Return to clinic in 3 weeks.    Felecia ShellingBrent M. Shweta Aman, DPM [facility]  Dr. Felecia ShellingBrent M. Helane Briceno, DPM    [address]                                        [place], [place] [address]                Office [phone]  Fax [phone]

## 2017-12-09 ENCOUNTER — Ambulatory Visit (INDEPENDENT_AMBULATORY_CARE_PROVIDER_SITE_OTHER): Payer: PPO | Admitting: Family Medicine

## 2017-12-09 ENCOUNTER — Encounter: Payer: Self-pay | Admitting: Family Medicine

## 2017-12-09 VITALS — BP 126/82 | HR 83 | Temp 97.9°F | Resp 12 | Ht 72.0 in | Wt 158.2 lb

## 2017-12-09 DIAGNOSIS — E876 Hypokalemia: Secondary | ICD-10-CM | POA: Diagnosis not present

## 2017-12-09 DIAGNOSIS — D72829 Elevated white blood cell count, unspecified: Secondary | ICD-10-CM

## 2017-12-09 DIAGNOSIS — F419 Anxiety disorder, unspecified: Secondary | ICD-10-CM | POA: Diagnosis not present

## 2017-12-09 DIAGNOSIS — E1149 Type 2 diabetes mellitus with other diabetic neurological complication: Secondary | ICD-10-CM

## 2017-12-09 DIAGNOSIS — E1142 Type 2 diabetes mellitus with diabetic polyneuropathy: Secondary | ICD-10-CM | POA: Diagnosis not present

## 2017-12-09 DIAGNOSIS — I10 Essential (primary) hypertension: Secondary | ICD-10-CM | POA: Diagnosis not present

## 2017-12-09 LAB — POCT GLYCOSYLATED HEMOGLOBIN (HGB A1C): Hemoglobin A1C: 9.5 % — AB (ref 4.0–5.6)

## 2017-12-09 MED ORDER — INSULIN GLARGINE 100 UNIT/ML SOLOSTAR PEN: 25.0000 [IU] | PEN_INJECTOR | Freq: Every day | SUBCUTANEOUS | 11 refills | Status: DC

## 2017-12-09 NOTE — Assessment & Plan Note (Addendum)
Stable overall. No changes in Cymbalta.Discussed risk of interaction with Tramadol. For now I am not recommending adding medication or resuming Buspar.

## 2017-12-09 NOTE — Assessment & Plan Note (Signed)
Continue K+ containing foods intake. Further recommendations will be given according to BMP results.

## 2017-12-09 NOTE — Patient Instructions (Addendum)
A few things to remember from today's visit:   Diabetes mellitus type 2 with neurological manifestations (HCC) - Plan: POCT glycosylated hemoglobin (Hb A1C)  Essential hypertension  Anxiety disorder, unspecified type  Hypokalemia - Plan: Basic metabolic panel  Leukocytosis, unspecified type - Plan: CBC with Differential/Platelet  For now continue Cymbalta 60 mg daily. Is a new medication to help you sleep, good sleep hygiene recommended. You need to eat something between 1 and 3 AM, you need to eat lunch around 1-2 PM. Continue following with Dr. Allena KatzPatel for pain management.  Please be sure medication list is accurate. If a new problem present, please set up appointment sooner than planned today.

## 2017-12-09 NOTE — Assessment & Plan Note (Signed)
HgA1C is not at goal. He cannot afford Victoza. No changes in Metformin for now. Lantus increased from 20 U to 25 U. Regular exercise as tolerated and healthy diet with avoidance of added sugar food intake is an important part of treatment and recommended. Annual eye exam, he has not had one yet. Periodic dental and foot care discussed. F/U in 5-6 months

## 2017-12-09 NOTE — Assessment & Plan Note (Signed)
Adequately controlled. No changes in current management. Low salt diet recommended. Eye exam recommended annually. F/U in 4 months, before if needed.  

## 2017-12-09 NOTE — Progress Notes (Signed)
HPI:   Jeremy Gill is a 72 y.o. male, who is here today to follow on recent ED visit.   I last saw him on 08/20/2017. Since his last visit he has been hospitalized, 09/30/2017.  He did not schedule follow-up appointment. He was seen in the ER on 10/15/2017 due to MS changes.    Lab Results  Component Value Date   CREATININE 1.34 (H) 10/15/2017   BUN 34 (H) 10/15/2017   NA 139 10/15/2017   K 3.3 (L) 10/15/2017   CL 100 (L) 10/15/2017   CO2 26 10/15/2017   Lab Results  Component Value Date   WBC 18.8 (H) 10/15/2017   HGB 12.0 (L) 10/15/2017   HCT 35.3 (L) 10/15/2017   MCV 89.1 10/15/2017   PLT 309 10/15/2017   CXR: COPD, no active disease.   HTN:  He is on Metoprolol Succinate 12.5 mg daily. Denies severe/frequent headache, visual changes, chest pain, dyspnea, claudication, focal weakness, or edema. HypoK+, 3.3. He is not on K+ supplementation.  Sleep disorder: His wife is concerned about not taking amitriptyline, medication was discontinued during hospitalization. He states that he likes to stay up all night and sleep during the day. He has no problem sleeping during the day, sleeps from 8 to 12 hours.He is a "night person."  Diabetes Mellitus II:   Currently on Lantus 20 units daily and metformin  1000 mg twice daily He is taking Victoza prn depending of BS numbers.it is expensive.  Checking BS's : According to wife, numbers have been better, 200's. Hypoglycemia: He was having BS in the 60s and 70s when he was on Lantus 40 units.  He is not consistent with dietary recommendations. Pending dental appt,he had to cancel due to hospitalization.  Last eye exam over a year ago.  He is tolerating medications well. Negative for abdominal pain, nausea, vomiting, polydipsia, polyuria, or polyphagia. + Peripheral neuropathy,bilateral burning and tingling pain.He is on Cymbalta 60 mg and Gabapentin 600 mg tid.  Right foot ulcer,following with podiatrist, he  states that it is healing but slowly.    Lab Results  Component Value Date   HGBA1C 10.0 08/20/2017   Lab Results  Component Value Date   MICROALBUR <0.7 04/16/2017   Anxiety and depression : He discontinued Buspar 1-2 months ago,he was taking 30 mg bid. Currently he is on Cymbalta 60 mg. He does not think another medication is needed at this time. No depressed mood or suicidal thoughts. Chronic pain,he is on Diclofenac and Tramadol.Medications recently adjusted,feeling better.   Review of Systems  Constitutional: Positive for fatigue. Negative for activity change, appetite change, fever and unexpected weight change.  HENT: Negative for nosebleeds, sore throat and trouble swallowing.   Eyes: Negative for redness and visual disturbance.  Respiratory: Negative for cough, shortness of breath and wheezing.   Cardiovascular: Negative for chest pain, palpitations and leg swelling.  Gastrointestinal: Negative for abdominal pain, nausea and vomiting.  Endocrine: Negative for polydipsia, polyphagia and polyuria.  Genitourinary: Negative for decreased urine volume, dysuria and hematuria.  Musculoskeletal: Positive for arthralgias, back pain and gait problem.  Skin: Positive for wound. Negative for rash.  Neurological: Negative for syncope, weakness and headaches.  Psychiatric/Behavioral: Negative for confusion, hallucinations and sleep disturbance. The patient is not nervous/anxious.       Current Outpatient Medications on File Prior to Visit  Medication Sig Dispense Refill  . aspirin 81 MG chewable tablet Chew 81 mg by mouth 2 (two)  times a week. Pt takes on Monday and Friday.    . baclofen (LIORESAL) 10 MG tablet TAKE 1 TABLET BY MOUTH 3 TIMES DAILY 90 tablet 1  . collagenase (SANTYL) ointment Apply 1 application topically daily. 30 g 1  . diclofenac (VOLTAREN) 50 MG EC tablet Take 1 tablet (50 mg total) by mouth 3 (three) times daily. 90 tablet 1  . DULoxetine (CYMBALTA) 60 MG  capsule TAKE 1 CAPSULE BY MOUTH EVERY DAY 30 capsule 2  . fluocinonide cream (LIDEX) 0.05 % Apply topically 3 (three) times daily as needed. For itching (Patient taking differently: Apply 1 application topically 3 (three) times daily as needed (for itching). ) 30 g 3  . gabapentin (NEURONTIN) 600 MG tablet Take 1 tablet (600 mg total) by mouth 3 (three) times daily. 90 tablet 0  . glucose blood test strip Use to test blood sugar three-four times daily. 400 each 3  . Insulin Pen Needle (PEN NEEDLES) 32G X 4 MM MISC Inject 1 application into the skin as directed. USE WITH INSULIN PEN 100 each 1  . ipratropium-albuterol (DUONEB) 0.5-2.5 (3) MG/3ML SOLN Take 3 mLs by nebulization every 6 (six) hours as needed. 360 mL 0  . metFORMIN (GLUCOPHAGE) 500 MG tablet Take 2 tablets (1,000 mg total) by mouth 2 (two) times daily with a meal. 120 tablet 4  . metoprolol succinate (TOPROL-XL) 25 MG 24 hr tablet Take 0.5 tablets (12.5 mg total) by mouth daily. 90 tablet 1  . triamcinolone ointment (KENALOG) 0.1 % Apply topically 2 (two) times daily. 1:1 compound with Eucerin (Patient taking differently: Apply 1 application topically 2 (two) times daily as needed (rash). Compounded as a 1:1 mixture with Eucerin.) 453.6 g 3  . atorvastatin (LIPITOR) 40 MG tablet Take 1 tablet (40 mg total) by mouth daily. 90 tablet 3   No current facility-administered medications on file prior to visit.      Past Medical History:  Diagnosis Date  . ANKLE SPRAIN 12/16/2006   Qualifier: Diagnosis of  By: Tawanna Cooler MD, Eugenio Hoes   . ANXIETY 10/25/2006  . CELLULITIS, LEG, RIGHT 09/08/2007   Qualifier: Diagnosis of  By: Tawanna Cooler MD, Eugenio Hoes   . Chronic pain disorder 11/30/2015  . COPD 10/25/2006  . 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 DIABETES MELLITUS, TYPE I 10/25/2006  . Diabetic polyneuropathy (HCC) 04/08/2007   Qualifier: Diagnosis of  By:  Tawanna Cooler MD, Eugenio Hoes   . 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 Essential hypertension 10/25/2006   Qualifier: Diagnosis of  By: Rosette Reveal RN, Jorene Minors   . Generalized osteoarthritis of multiple sites 11/30/2015  . HYPERTENSION 10/25/2006  . 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 Low back pain with radiation 01/23/2016  . Polyneuropathy in diabetes(357.2) 04/08/2007  . Psoriasis   . TOBACCO ABUSE 07/08/2007  . VENOUS INSUFFICIENCY 11/25/2007   No Known Allergies  Social History   Socioeconomic History  . Marital status: Married    Spouse name: Not on file  . Number of children: Not on file  . Years of education: Not on file  . Highest education level: Not on file  Occupational History  . Occupation: Self Employed  Social Needs  . Financial resource strain: Not on file  . Food insecurity:    Worry: Not on file    Inability: Not on file  .  Transportation needs:    Medical: Not on file    Non-medical: Not on file  Tobacco Use  . Smoking status: Current Every Day Smoker    Types: Pipe    Last attempt to quit: 05/29/2011    Years since quitting: 6.5  . Smokeless tobacco: Never Used  Substance and Sexual Activity  . Alcohol use: No  . Drug use: No  . Sexual activity: Not on file  Lifestyle  . Physical activity:    Days per week: Not on file    Minutes per session: Not on file  . Stress: Not on file  Relationships  . Social connections:    Talks on phone: Not on file    Gets together: Not on file    Attends religious service: Not on file    Active member of club or organization: Not on file    Attends meetings of clubs or organizations: Not on file    Relationship status: Not on file  Other Topics Concern  . Not on file  Social History Narrative   Regular exercise-no    Vitals:   12/09/17 1634  BP: 126/82  Pulse: 83  Resp: 12  Temp: 97.9 F (36.6 C)  SpO2: 98%   Body mass index is 21.46 kg/m.    Physical Exam    Nursing note and vitals reviewed. Constitutional: He is oriented to person, place, and time. He appears well-developed and well-nourished. No distress.  HENT:  Head: Normocephalic and atraumatic.  Mouth/Throat: Oropharynx is clear and moist and mucous membranes are normal. Abnormal dentition.  Eyes: Pupils are equal, round, and reactive to light. Conjunctivae are normal.  Cardiovascular: Normal rate and regular rhythm.  No murmur heard. Pulses:      Dorsalis pedis pulses are 2+ on the right side, and 2+ on the left side.  Respiratory: Effort normal and breath sounds normal. No respiratory distress.  GI: Soft. He exhibits no mass. There is no tenderness.  Musculoskeletal: He exhibits no edema.  Lymphadenopathy:    He has no cervical adenopathy.  Neurological: He is alert and oriented to person, place, and time. He has normal strength.  Unstable gait assisted with a cane,  Skin: Skin is warm. Abrasion noted. No rash noted. No erythema.     Psychiatric: He has a normal mood and affect.  Well groomed, good eye contact.    ASSESSMENT AND PLAN:  Jeremy Gill was seen today for er follow-up.  Orders Placed This Encounter  Procedures  . Basic metabolic panel  . CBC with Differential/Platelet  . POCT glycosylated hemoglobin (Hb A1C)    Lab Results  Component Value Date   CREATININE 1.11 12/09/2017   BUN 25 (H) 12/09/2017   NA 136 12/09/2017   K 5.1 12/09/2017   CL 99 12/09/2017   CO2 30 12/09/2017   Lab Results  Component Value Date   HGBA1C 9.5 (A) 12/09/2017   Lab Results  Component Value Date   WBC 6.8 12/09/2017   HGB 11.8 (L) 12/09/2017   HCT 34.8 (L) 12/09/2017   MCV 90.8 12/09/2017   PLT 219.0 12/09/2017     Diabetes mellitus type 2 with neurological manifestations (HCC) HgA1C is not at goal. He cannot afford Victoza. No changes in Metformin for now. Lantus increased from 20 U to 25 U. Regular exercise as tolerated and healthy diet with avoidance of added  sugar food intake is an important part of treatment and recommended. Annual eye exam, he has not had one  yet. Periodic dental and foot care discussed. F/U in 5-6 months   Diabetic polyneuropathy (HCC) No changes in Gabapentin. Continue following with podiatrist for right foot ulcer,whihc is healing slowly. Better glucose controlled strongly recommended.  Essential hypertension Adequately controlled. No changes in current management. Low salt diet recommended. Eye exam recommended annually. F/U in 4 months, before if needed.   Anxiety disorder, unspecified Stable overall. No changes in Cymbalta.Discussed risk of interaction with Tramadol. For now I am not recommending adding medication or resuming Buspar.   Hypokalemia Continue K+ containing foods intake. Further recommendations will be given according to BMP results.    Leukocytosis, unspecified type  Further recommendations will be given according to CBC results.    40 min face to face OV. > 50% was dedicated to discussion of Dx, prognosis and possible complications if not compliance, treatment options for DM II, and some side effects of medications,as well as   We discussed dietary recommendations and hypoglycemia prevention. He goes to bed at 5 Am, he needs to eat between 1-2 Am, then around 2 pm, continue dinner at 5-6 pm.      [name]. SwazilandJordan, MD  [facility]. [place] office.

## 2017-12-09 NOTE — Assessment & Plan Note (Signed)
No changes in Gabapentin. Continue following with podiatrist for right foot ulcer,whihc is healing slowly. Better glucose controlled strongly recommended.

## 2017-12-10 ENCOUNTER — Other Ambulatory Visit: Payer: Self-pay | Admitting: Physical Medicine & Rehabilitation

## 2017-12-10 ENCOUNTER — Other Ambulatory Visit: Payer: Self-pay | Admitting: Family Medicine

## 2017-12-10 LAB — CBC WITH DIFFERENTIAL/PLATELET
BASOS PCT: 0.7 % (ref 0.0–3.0)
Basophils Absolute: 0 10*3/uL (ref 0.0–0.1)
EOS ABS: 0.4 10*3/uL (ref 0.0–0.7)
Eosinophils Relative: 6.2 % — ABNORMAL HIGH (ref 0.0–5.0)
HCT: 34.8 % — ABNORMAL LOW (ref 39.0–52.0)
Hemoglobin: 11.8 g/dL — ABNORMAL LOW (ref 13.0–17.0)
LYMPHS ABS: 2.7 10*3/uL (ref 0.7–4.0)
Lymphocytes Relative: 39.8 % (ref 12.0–46.0)
MCHC: 33.9 g/dL (ref 30.0–36.0)
MCV: 90.8 fl (ref 78.0–100.0)
MONO ABS: 0.4 10*3/uL (ref 0.1–1.0)
Monocytes Relative: 5.6 % (ref 3.0–12.0)
NEUTROS ABS: 3.3 10*3/uL (ref 1.4–7.7)
Neutrophils Relative %: 47.7 % (ref 43.0–77.0)
PLATELETS: 219 10*3/uL (ref 150.0–400.0)
RBC: 3.83 Mil/uL — ABNORMAL LOW (ref 4.22–5.81)
RDW: 15 % (ref 11.5–15.5)
WBC: 6.8 10*3/uL (ref 4.0–10.5)

## 2017-12-10 LAB — BASIC METABOLIC PANEL
BUN: 25 mg/dL — AB (ref 6–23)
CHLORIDE: 99 meq/L (ref 96–112)
CO2: 30 meq/L (ref 19–32)
CREATININE: 1.11 mg/dL (ref 0.40–1.50)
Calcium: 9.3 mg/dL (ref 8.4–10.5)
GFR: 69.16 mL/min (ref >60.00)
GLUCOSE: 234 mg/dL — AB (ref 70–99)
Potassium: 5.1 mEq/L (ref 3.5–5.1)
Sodium: 136 mEq/L (ref 135–145)

## 2017-12-12 ENCOUNTER — Encounter: Payer: Self-pay | Admitting: Family Medicine

## 2017-12-18 ENCOUNTER — Telehealth: Payer: Self-pay | Admitting: Family Medicine

## 2017-12-18 ENCOUNTER — Encounter: Payer: PPO | Admitting: Physical Medicine & Rehabilitation

## 2017-12-18 NOTE — Telephone Encounter (Unsigned)
Copied from CRM [phone]#[id number]. Topic: Quick Communication - Rx Refill/Question >> Dec 18, 2017  4:29 PM Raquel SarnaHayes, Teresa G wrote: atorvastatin (LIPITOR) 40 MG tablet  Needing refills   [address], [place], [place] - [address] [phone] (Phone) [phone] (Fax)

## 2017-12-20 NOTE — Telephone Encounter (Signed)
Refill of lipitor by historical provider  LRF 11/26/16   #90  3 refills  LOV 12/09/17  Dr. SwazilandJordan  [facility] [place], [place] - [address]       [phone] (Phone) [phone] (Fax)

## 2017-12-23 ENCOUNTER — Other Ambulatory Visit: Payer: Self-pay | Admitting: Family Medicine

## 2017-12-23 ENCOUNTER — Other Ambulatory Visit: Payer: Self-pay | Admitting: *Deleted

## 2017-12-23 MED ORDER — ATORVASTATIN CALCIUM 40 MG PO TABS: 40.0000 mg | ORAL_TABLET | Freq: Every day | ORAL | 3 refills | Status: DC

## 2017-12-23 NOTE — Telephone Encounter (Signed)
Rx sent to pharmacy   

## 2017-12-25 ENCOUNTER — Ambulatory Visit: Payer: PPO | Admitting: Podiatry

## 2018-01-06 ENCOUNTER — Telehealth: Payer: Self-pay | Admitting: Family Medicine

## 2018-01-06 ENCOUNTER — Other Ambulatory Visit: Payer: Self-pay | Admitting: *Deleted

## 2018-01-06 DIAGNOSIS — E1149 Type 2 diabetes mellitus with other diabetic neurological complication: Secondary | ICD-10-CM

## 2018-01-06 MED ORDER — PEN NEEDLES 32G X 4 MM MISC: 1.0000 "application " | 1 refills | Status: DC

## 2018-01-06 MED ORDER — INSULIN GLARGINE 100 UNIT/ML SOLOSTAR PEN: 25.0000 [IU] | PEN_INJECTOR | Freq: Every day | SUBCUTANEOUS | 11 refills | Status: DC

## 2018-01-06 NOTE — Telephone Encounter (Signed)
Copied from CRM [phone]#[id number]. Topic: Quick Communication - Rx Refill/Question >> Jan 06, 2018 12:01 PM Gaynelle AduPoole, Shalonda wrote: Medication: Insulin Glargine (LANTUS SOLOSTAR) 100 UNIT/ML Solostar Pen   Insulin Pen Needle (PEN NEEDLES) 32G X 4 MM MISC    Has the patient contacted their pharmacy?no   Preferred Pharmacy (with phone number or street name): [facility] [place], [place] - [id number] [phone] (Phone) [phone] (Fax)    Agent: Please be advised that RX refills may take up to 3 business days. We ask that you follow-up with your pharmacy.

## 2018-01-06 NOTE — Telephone Encounter (Signed)
Rx sent to pharmacy as requested.

## 2018-01-08 ENCOUNTER — Other Ambulatory Visit: Payer: Self-pay | Admitting: *Deleted

## 2018-01-08 MED ORDER — "INSULIN SYRINGE 31G X 5/16"" 0.5 ML MISC": 5 refills | Status: DC

## 2018-01-08 MED ORDER — ACCU-CHEK SOFTCLIX LANCETS MISC: 12 refills | Status: AC

## 2018-01-08 NOTE — Telephone Encounter (Signed)
Spoke with wife and confirmed what needed to be refilled and she stated that the first person she spoke to on the 12th, she told them that she needed the accu chek softclix lancets and the insulin syringes for his lantus. Rx's sent pharmacy.

## 2018-01-08 NOTE — Telephone Encounter (Signed)
Patient wife is calling back and states the patient was not needing the 2 prescriptions below instead she was requesting a prescription for the Lancets that (prick your finger) for the acucheck soft click. She states she also asked for the syringes for the insulin. She states the 2 prescriptions sent on 01/06/18 were not right.    [address] [place] [facility] [place], [place] - [address]  [address] [place] [address]  Phone: [phone] Fax: [phone]

## 2018-01-09 NOTE — Telephone Encounter (Signed)
Will route to office for clarifications of orders; lantus is not on the pt's medication list. Pt's last office visit with Dr Betty SwazilandJordan was 12/09/17.

## 2018-01-09 NOTE — Telephone Encounter (Signed)
Olegario MessierKathy from [facility] called stating that she needs the frequency of how often pt needs to use both of the following for insurance purposes.    Lancets Use as instructed    Insulin Syringe-Needle U-100 USE AS DIRECTED WITH LANTUS  Best call back: [phone]

## 2018-01-09 NOTE — Telephone Encounter (Signed)
Spoke with pharmacist regarding the pt Lantus dosage. However, I do not see this on pt med list. Last Rx was discontinued for change in therapy. Did not see the new Rx. Pharmacist was told that someone will get back to her maybe tomorrow for clarification. Accu check lancets was corrected. No further action needed for that!

## 2018-01-10 MED ORDER — INSULIN GLARGINE 100 UNIT/ML SOLOSTAR PEN: PEN_INJECTOR | SUBCUTANEOUS | 99 refills | Status: DC

## 2018-01-10 NOTE — Addendum Note (Signed)
Addended by: Waymon AmatoJOHNSON, Aitan Rossbach R on: 01/10/2018 10:52 AM   Modules accepted: Orders

## 2018-01-10 NOTE — Telephone Encounter (Signed)
Last visit his Lantus dose was increased from 20 to 25 units. Instructed patient to increased Lantus by 5 units every 1 to 2 weeks if blood sugars still above 200.  So maybe is better to call patient to be sure about dose before sending prescription.  Thanks, BJ

## 2018-01-10 NOTE — Telephone Encounter (Signed)
Spoke to pt wife and she informed me of pt dosage and schedule. Pt wife was informed to increase Lantus 5 untis every 1 -2 weeks due to pt recent readings ranging from 174 in the am and over 200 at night.

## 2018-01-10 NOTE — Telephone Encounter (Signed)
Left message to return phone call.

## 2018-01-13 ENCOUNTER — Emergency Department (HOSPITAL_COMMUNITY): Payer: PPO

## 2018-01-13 ENCOUNTER — Inpatient Hospital Stay (HOSPITAL_COMMUNITY): Payer: PPO

## 2018-01-13 ENCOUNTER — Encounter (HOSPITAL_COMMUNITY): Payer: Self-pay | Admitting: Emergency Medicine

## 2018-01-13 ENCOUNTER — Ambulatory Visit: Payer: PPO | Admitting: Podiatry

## 2018-01-13 ENCOUNTER — Other Ambulatory Visit: Payer: Self-pay

## 2018-01-13 ENCOUNTER — Inpatient Hospital Stay (HOSPITAL_COMMUNITY)
Admission: EM | Admit: 2018-01-13 | Discharge: 2018-01-17 | DRG: 682 | Disposition: A | Discharge status: Home / Self Care | Payer: PPO | Attending: Internal Medicine | Admitting: Internal Medicine

## 2018-01-13 DIAGNOSIS — Z6820 Body mass index (BMI) 20.0-20.9, adult: Secondary | ICD-10-CM

## 2018-01-13 DIAGNOSIS — G9340 Encephalopathy, unspecified: Secondary | ICD-10-CM | POA: Diagnosis present

## 2018-01-13 DIAGNOSIS — E785 Hyperlipidemia, unspecified: Secondary | ICD-10-CM | POA: Diagnosis not present

## 2018-01-13 DIAGNOSIS — K5641 Fecal impaction: Secondary | ICD-10-CM | POA: Diagnosis present

## 2018-01-13 DIAGNOSIS — N1330 Unspecified hydronephrosis: Secondary | ICD-10-CM | POA: Diagnosis not present

## 2018-01-13 DIAGNOSIS — E876 Hypokalemia: Secondary | ICD-10-CM | POA: Diagnosis not present

## 2018-01-13 DIAGNOSIS — I10 Essential (primary) hypertension: Secondary | ICD-10-CM | POA: Diagnosis not present

## 2018-01-13 DIAGNOSIS — Z7982 Long term (current) use of aspirin: Secondary | ICD-10-CM

## 2018-01-13 DIAGNOSIS — G9341 Metabolic encephalopathy: Secondary | ICD-10-CM | POA: Diagnosis present

## 2018-01-13 DIAGNOSIS — E11649 Type 2 diabetes mellitus with hypoglycemia without coma: Secondary | ICD-10-CM | POA: Diagnosis not present

## 2018-01-13 DIAGNOSIS — J449 Chronic obstructive pulmonary disease, unspecified: Secondary | ICD-10-CM | POA: Diagnosis present

## 2018-01-13 DIAGNOSIS — E1159 Type 2 diabetes mellitus with other circulatory complications: Secondary | ICD-10-CM | POA: Diagnosis present

## 2018-01-13 DIAGNOSIS — R4182 Altered mental status, unspecified: Secondary | ICD-10-CM | POA: Diagnosis not present

## 2018-01-13 DIAGNOSIS — D649 Anemia, unspecified: Secondary | ICD-10-CM | POA: Diagnosis present

## 2018-01-13 DIAGNOSIS — K5900 Constipation, unspecified: Secondary | ICD-10-CM | POA: Diagnosis not present

## 2018-01-13 DIAGNOSIS — E1142 Type 2 diabetes mellitus with diabetic polyneuropathy: Secondary | ICD-10-CM | POA: Diagnosis not present

## 2018-01-13 DIAGNOSIS — E1149 Type 2 diabetes mellitus with other diabetic neurological complication: Secondary | ICD-10-CM | POA: Diagnosis present

## 2018-01-13 DIAGNOSIS — Z79899 Other long term (current) drug therapy: Secondary | ICD-10-CM

## 2018-01-13 DIAGNOSIS — E871 Hypo-osmolality and hyponatremia: Secondary | ICD-10-CM | POA: Diagnosis not present

## 2018-01-13 DIAGNOSIS — F17290 Nicotine dependence, other tobacco product, uncomplicated: Secondary | ICD-10-CM | POA: Diagnosis present

## 2018-01-13 DIAGNOSIS — E43 Unspecified severe protein-calorie malnutrition: Secondary | ICD-10-CM

## 2018-01-13 DIAGNOSIS — Z794 Long term (current) use of insulin: Secondary | ICD-10-CM | POA: Diagnosis not present

## 2018-01-13 DIAGNOSIS — G8929 Other chronic pain: Secondary | ICD-10-CM | POA: Diagnosis present

## 2018-01-13 DIAGNOSIS — E1169 Type 2 diabetes mellitus with other specified complication: Secondary | ICD-10-CM | POA: Diagnosis present

## 2018-01-13 DIAGNOSIS — R310 Gross hematuria: Secondary | ICD-10-CM | POA: Diagnosis not present

## 2018-01-13 DIAGNOSIS — R319 Hematuria, unspecified: Secondary | ICD-10-CM | POA: Diagnosis not present

## 2018-01-13 DIAGNOSIS — G894 Chronic pain syndrome: Secondary | ICD-10-CM | POA: Diagnosis present

## 2018-01-13 DIAGNOSIS — R531 Weakness: Secondary | ICD-10-CM | POA: Diagnosis not present

## 2018-01-13 DIAGNOSIS — R140 Abdominal distension (gaseous): Secondary | ICD-10-CM | POA: Diagnosis not present

## 2018-01-13 DIAGNOSIS — I152 Hypertension secondary to endocrine disorders: Secondary | ICD-10-CM | POA: Diagnosis present

## 2018-01-13 DIAGNOSIS — E860 Dehydration: Secondary | ICD-10-CM | POA: Diagnosis present

## 2018-01-13 DIAGNOSIS — N179 Acute kidney failure, unspecified: Secondary | ICD-10-CM | POA: Diagnosis not present

## 2018-01-13 DIAGNOSIS — R0600 Dyspnea, unspecified: Secondary | ICD-10-CM | POA: Diagnosis not present

## 2018-01-13 DIAGNOSIS — R338 Other retention of urine: Secondary | ICD-10-CM | POA: Diagnosis present

## 2018-01-13 LAB — CBC
HEMATOCRIT: 35.3 % — AB (ref 39.0–52.0)
HEMOGLOBIN: 11.7 g/dL — AB (ref 13.0–17.0)
MCH: 29.9 pg (ref 26.0–34.0)
MCHC: 33.1 g/dL (ref 30.0–36.0)
MCV: 90.3 fL (ref 78.0–100.0)
Platelets: 311 10*3/uL (ref 150–400)
RBC: 3.91 MIL/uL — AB (ref 4.22–5.81)
RDW: 14.3 % (ref 11.5–15.5)
WBC: 13.6 10*3/uL — ABNORMAL HIGH (ref 4.0–10.5)

## 2018-01-13 LAB — COMPREHENSIVE METABOLIC PANEL
ALBUMIN: 3.7 g/dL (ref 3.5–5.0)
ALT: 16 U/L (ref 0–44)
ANION GAP: 13 (ref 5–15)
AST: 25 U/L (ref 15–41)
Alkaline Phosphatase: 39 U/L (ref 38–126)
BILIRUBIN TOTAL: 0.8 mg/dL (ref 0.3–1.2)
BUN: 32 mg/dL — ABNORMAL HIGH (ref 8–23)
CO2: 26 mmol/L (ref 22–32)
Calcium: 9.2 mg/dL (ref 8.9–10.3)
Chloride: 102 mmol/L (ref 98–111)
Creatinine, Ser: 1.44 mg/dL — ABNORMAL HIGH (ref 0.61–1.24)
GFR calc non Af Amer: 47 mL/min — ABNORMAL LOW (ref >60)
GFR, EST AFRICAN AMERICAN: 55 mL/min — AB (ref >60)
GLUCOSE: 119 mg/dL — AB (ref 70–99)
POTASSIUM: 4.4 mmol/L (ref 3.5–5.1)
SODIUM: 141 mmol/L (ref 135–145)
Total Protein: 7.6 g/dL (ref 6.5–8.1)

## 2018-01-13 LAB — URINALYSIS, ROUTINE W REFLEX MICROSCOPIC
BILIRUBIN URINE: NEGATIVE
GLUCOSE, UA: 150 mg/dL — AB
Ketones, ur: NEGATIVE mg/dL
Leukocytes, UA: NEGATIVE
NITRITE: NEGATIVE
PROTEIN: NEGATIVE mg/dL
Specific Gravity, Urine: 1.009 (ref 1.005–1.030)
pH: 5 (ref 5.0–8.0)

## 2018-01-13 LAB — I-STAT CHEM 8, ED
BUN: 33 mg/dL — AB (ref 8–23)
CHLORIDE: 102 mmol/L (ref 98–111)
Calcium, Ion: 1.13 mmol/L — ABNORMAL LOW (ref 1.15–1.40)
Creatinine, Ser: 1.5 mg/dL — ABNORMAL HIGH (ref 0.61–1.24)
Glucose, Bld: 117 mg/dL — ABNORMAL HIGH (ref 70–99)
HEMATOCRIT: 36 % — AB (ref 39.0–52.0)
Hemoglobin: 12.2 g/dL — ABNORMAL LOW (ref 13.0–17.0)
POTASSIUM: 4.3 mmol/L (ref 3.5–5.1)
SODIUM: 140 mmol/L (ref 135–145)
TCO2: 28 mmol/L (ref 22–32)

## 2018-01-13 LAB — BLOOD GAS, VENOUS
Acid-Base Excess: 4.1 mmol/L — ABNORMAL HIGH (ref 0.0–2.0)
BICARBONATE: 29.5 mmol/L — AB (ref 20.0–28.0)
O2 Saturation: 31.7 %
PATIENT TEMPERATURE: 98.8
pCO2, Ven: 50.8 mmHg (ref 44.0–60.0)
pH, Ven: 7.383 (ref 7.250–7.430)

## 2018-01-13 LAB — DIFFERENTIAL
Basophils Absolute: 0 10*3/uL (ref 0.0–0.1)
Basophils Relative: 0 %
EOS ABS: 0 10*3/uL (ref 0.0–0.7)
EOS PCT: 0 %
LYMPHS PCT: 9 %
Lymphs Abs: 1.3 10*3/uL (ref 0.7–4.0)
MONO ABS: 0.5 10*3/uL (ref 0.1–1.0)
Monocytes Relative: 4 %
Neutro Abs: 11.8 10*3/uL — ABNORMAL HIGH (ref 1.7–7.7)
Neutrophils Relative %: 87 %

## 2018-01-13 LAB — I-STAT TROPONIN, ED: TROPONIN I, POC: 0 ng/mL (ref 0.00–0.08)

## 2018-01-13 LAB — LACTIC ACID, PLASMA: Lactic Acid, Venous: 0.9 mmol/L (ref 0.5–1.9)

## 2018-01-13 LAB — CBG MONITORING, ED
GLUCOSE-CAPILLARY: 86 mg/dL (ref 70–99)
Glucose-Capillary: 107 mg/dL — ABNORMAL HIGH (ref 70–99)

## 2018-01-13 MED ORDER — TAMSULOSIN HCL 0.4 MG PO CAPS
0.4000 mg | ORAL_CAPSULE | Freq: Every day | ORAL | Status: DC
  Administered 2018-01-14 – 2018-01-17 (×4): 0.4 mg via ORAL
  Filled 2018-01-13 (×4): qty 1

## 2018-01-13 MED ORDER — SODIUM CHLORIDE 0.9 % IV BOLUS
1000.0000 mL | Freq: Once | INTRAVENOUS | Status: AC
  Administered 2018-01-13: 1000 mL via INTRAVENOUS

## 2018-01-13 MED ORDER — MILK AND MOLASSES ENEMA
1.0000 | Freq: Once | RECTAL | Status: AC
  Administered 2018-01-14: 250 mL via RECTAL
  Filled 2018-01-13: qty 250

## 2018-01-13 NOTE — H&P (Signed)
Jeremy Gill ZOX:[id number] DOB: [birth year] DOA: 01/13/2018     PCP: [place], [name], MD   Outpatient Specialists:[facility]   Patient arrived to ER on 01/13/18 at 1610  Patient coming from:    home Lives   With family    Chief Complaint:  Chief Complaint  Patient presents with  . Altered Mental Status    HPI: Jeremy Gill is a 72 y.o. male with medical history significant of  DM2, COPD, HTN, diabetic neuropathy    Presented with wildly fluctuating blood glucose. Have been more confused as per family for the past 2 days. For the past 2 days have been unstable, too weak to get out of the bed. He has been leaning to his right. Wife noted left side of the face is droopy for the past 2-3 months. No new medications.   Wife checked his blood sugars yesterday was in  50s and went up to 600  With orange juice and some Candy called EMS insulin was administered in today in the morning again was hypoglycemic used to be on Lantus 20 units and metformin also has been dosing himself with with Victoza as needed since it is expensive not taking on a regular basis.  His blood sugar has been running in 170s to 200 and he was seen in July by his primary care provider who increased Lantus to 25 units and instructed to continue to titrate up by 5 units a week. Last night   wife gave 25 units of Lantus. She has been given mostly 20 units but occasionally has given him 25 units when BG has been running high.   Wife states for the past 2 days patient has not been able to urinate he has been trying to stand up to do so but could not empty his bladder.  Regarding pertinent Chronic problems: History of diabetic neuropathy for which she takes Cymbalta and Neurontin recently in May his amitriptyline has been stopped   While in ER: Noted to have AKI Pt was noted to be retaining urine 1100 ML in the bladder requiring foley placement Urine with large amount of RBCs was noted CT abdomen to rule out  nephrolithiasis ordered but currently pending  The following Work up has been ordered so far:  Orders Placed This Encounter  Procedures  . Urine culture  . CT Head Wo Contrast  . DG Chest [facility]  . Comprehensive metabolic panel  . CBC  . Urinalysis, Routine w reflex microscopic  . Differential  . Diet NPO time specified  . Cardiac monitoring  . Saline Lock IV, Maintain IV access  . In and Out Cath  . Consult to hospitalist  . Pulse oximetry, continuous  . CBG monitoring, ED  . CBG monitoring, ED  . I-stat chem 8, ed  . I-stat troponin, ED  . EKG 12-Lead  . EKG 12-Lead      Following Medications were ordered in ER: Medications  sodium chloride 0.9 % bolus 1,000 mL (1,000 mLs Intravenous New Bag/Given 01/13/18 1750)    Significant initial  Findings: Abnormal Labs Reviewed  COMPREHENSIVE METABOLIC PANEL - Abnormal; Notable for the following components:      Result Value   Glucose, Bld 119 (*)    BUN 32 (*)    Creatinine, Ser 1.44 (*)    GFR calc non Af Amer 47 (*)    GFR calc Af Amer 55 (*)    All other components within normal limits  CBC - Abnormal; Notable for the following components:   WBC 13.6 (*)    RBC 3.91 (*)    Hemoglobin 11.7 (*)    HCT 35.3 (*)    All other components within normal limits  URINALYSIS, ROUTINE W REFLEX MICROSCOPIC - Abnormal; Notable for the following components:   APPearance CLOUDY (*)    Glucose, UA 150 (*)    Hgb urine dipstick LARGE (*)    RBC / HPF >50 (*)    Bacteria, UA RARE (*)    All other components within normal limits  DIFFERENTIAL - Abnormal; Notable for the following components:   Neutro Abs 11.8 (*)    All other components within normal limits  CBG MONITORING, ED - Abnormal; Notable for the following components:   Glucose-Capillary 107 (*)    All other components within normal limits  I-STAT CHEM 8, ED - Abnormal; Notable for the following components:   BUN 33 (*)    Creatinine, Ser 1.50 (*)    Glucose, Bld  117 (*)    Calcium, Ion 1.13 (*)    Hemoglobin 12.2 (*)    HCT 36.0 (*)    All other components within normal limits     Na 141 K 4.4  Cr  Up from baseline see below Lab Results  Component Value Date   CREATININE 1.50 (H) 01/13/2018   CREATININE 1.44 (H) 01/13/2018   CREATININE 1.11 12/09/2017      WBC  13.6  HG/HCT  stable,       Component Value Date/Time   HGB 12.2 (L) 01/13/2018 1757   HCT 36.0 (L) 01/13/2018 1757    Troponin (Point of Care Test) Recent Labs    01/13/18 1755  TROPIPOC 0.00     BNP (last 3 results) No results for input(s): BNP in the last 8760 hours.  ProBNP (last 3 results) No results for input(s): PROBNP in the last 8760 hours.  Lactic Acid, Venous    Component Value Date/Time   LATICACIDVEN 1.38 10/15/2017 1013      UA showing evidence of hematuria   CT HEAD - NON acute  CXR - NON acute  CTabd/pelvis -current evidence of obstipation and bladder distention  ECG:  Personally reviewed by me showing: HR : 97 Rhythm: NSR   no evidence of ischemic changes [id number]     ED Triage Vitals [01/13/18 1618]  Enc Vitals Group     BP (!) 168/98     Pulse Rate (!) 50     Resp 18     Temp 98.8 F (37.1 C)     Temp Source Oral     SpO2 98 %     Weight      Height      Head Circumference      Peak Flow      Pain Score 0     Pain Loc      Pain Edu?      Excl. in GC?   XBJY(78)@       Latest  Blood pressure (!) 155/82, pulse 93, temperature 98.8 F (37.1 C), temperature source Oral, resp. rate 14, SpO2 98 %.   Hospitalist was called for admission for AKI in the setting of urinary retention Resulting in acute metabolic encephalopathy  Review of Systems:    Pertinent positives include: confusion  Constitutional:  No weight loss, night sweats, Fevers, chills, fatigue, weight loss  HEENT:  No headaches, Difficulty swallowing,Tooth/dental problems,Sore throat,  No sneezing, itching, ear ache,  nasal congestion, post nasal  drip,  Cardio-vascular:  No chest pain, Orthopnea, PND, anasarca, dizziness, palpitations.no Bilateral lower extremity swelling  GI:  No heartburn, indigestion, abdominal pain, nausea, vomiting, diarrhea, change in bowel habits, loss of appetite, melena, blood in stool, hematemesis Resp:  no shortness of breath at rest. No dyspnea on exertion, No excess mucus, no productive cough, No non-productive cough, No coughing up of blood.No change in color of mucus.No wheezing. Skin:  no rash or lesions. No jaundice GU:  no dysuria, change in color of urine, no urgency or frequency. No straining to urinate.  No flank pain.  Musculoskeletal:  No joint pain or no joint swelling. No decreased range of motion. No back pain.  Psych:  No change in mood or affect. No depression or anxiety. No memory loss.  Neuro: no localizing neurological complaints, no tingling, no weakness, no double vision, no gait abnormality, no slurred speech, no   All systems reviewed and apart from HOPI all are negative  Past Medical History:   Past Medical History:  Diagnosis Date  . ANKLE SPRAIN 12/16/2006   Qualifier: Diagnosis of  By: Tawanna Cooler MD, Eugenio Hoes   . ANXIETY 10/25/2006  . CELLULITIS, LEG, RIGHT 09/08/2007   Qualifier: Diagnosis of  By: Tawanna Cooler MD, Eugenio Hoes   . Chronic pain disorder 11/30/2015  . COPD 10/25/2006  . 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 DIABETES MELLITUS, TYPE I 10/25/2006  . Diabetic polyneuropathy (HCC) 04/08/2007   Qualifier: Diagnosis of  By: Tawanna Cooler MD, Eugenio Hoes   . 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 Essential hypertension 10/25/2006   Qualifier: Diagnosis of  By: Rosette Reveal RN, Jorene Minors   . Generalized osteoarthritis of multiple sites 11/30/2015  . HYPERTENSION 10/25/2006  . 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 Low back pain with radiation 01/23/2016    . Polyneuropathy in diabetes(357.2) 04/08/2007  . Psoriasis   . TOBACCO ABUSE 07/08/2007  . VENOUS INSUFFICIENCY 11/25/2007      Past Surgical History:  Procedure Laterality Date  . TESTICLE REMOVAL     R testicle    Social History:  Ambulatory  cane    reports that he has been smoking pipe. He has never used smokeless tobacco. He reports that he does not drink alcohol or use drugs.     Family History:   Family History  Problem Relation Age of Onset  . Cancer Mother        Unknown   . Diabetes Sister     Allergies: No Known Allergies   Prior to Admission medications   Medication Sig Start Date End Date Taking? Authorizing Provider  aspirin 81 MG chewable tablet Chew 81 mg by mouth 2 (two) times a week. Pt takes on Monday and Friday.   Yes [provider]  atorvastatin (LIPITOR) 40 MG tablet Take 1 tablet (40 mg total) by mouth daily. 12/23/17 03/23/18 Yes [place], [name], MD  baclofen (LIORESAL) 10 MG tablet TAKE 1 TABLET BY MOUTH 3 TIMES DAILY 11/27/17  Yes Marcello Fennel, MD  collagenase (SANTYL) ointment Apply 1 application topically daily. 09/16/17  Yes Felecia Shelling, DPM  diclofenac (VOLTAREN) 50 MG EC tablet Take 1 tablet (50 mg total) by mouth 3 (three) times daily. 11/27/17  Yes Marcello Fennel, MD  DULoxetine (CYMBALTA) 60 MG capsule TAKE 1 CAPSULE  BY MOUTH EVERY DAY 11/27/17  Yes Marcello Fennel, MD  gabapentin (NEURONTIN) 600 MG tablet Take 1 tablet (600 mg total) by mouth 3 (three) times daily. 10/04/17  Yes Sheikh, Omair Latif, DO  Insulin Glargine (LANTUS SOLOSTAR) 100 UNIT/ML Solostar Pen Inject 20-25 units into skin at 10 pm daily. If blood sugar is over 200, increase by 5 units every 1 to 2 weeks. 01/10/18  Yes Gordy Savers, MD  metFORMIN (GLUCOPHAGE) 500 MG tablet Take 2 tablets (1,000 mg total) by mouth 2 (two) times daily with a meal. 11/26/17  Yes [place], [name], MD  metoprolol succinate (TOPROL-XL) 25 MG 24 hr tablet Take 0.5 tablets  (12.5 mg total) by mouth daily. 02/06/17  Yes [place], [name], MD  traMADol (ULTRAM) 50 MG tablet TAKE 1 TABLET BY MOUTH EVERY 6 HOURS as needed for pain 12/11/17  Yes Marcello Fennel, MD  ACCU-CHEK [facility] LANCETS lancets Use as instructed 01/08/18   [place], [name], MD  fluocinonide cream (LIDEX) 0.05 % Apply topically 3 (three) times daily as needed. For itching Patient taking differently: Apply 1 application topically 3 (three) times daily as needed (for itching).  12/23/14   Roderick Pee, MD  glucose blood test strip Use to test blood sugar three-four times daily. 02/06/17   [place], [name], MD  Insulin Syringe-Needle U-100 (INSULIN SYRINGE .5CC/31GX5/16") 31G X 5/16" 0.5 ML MISC USE AS DIRECTED WITH LANTUS 01/08/18   [place], [name], MD  ipratropium-albuterol (DUONEB) 0.5-2.5 (3) MG/3ML SOLN Take 3 mLs by nebulization every 6 (six) hours as needed. Patient taking differently: Take 3 mLs by nebulization every 6 (six) hours as needed (sob and wheezing).  10/04/17   Marguerita Merles Latif, DO  triamcinolone ointment (KENALOG) 0.1 % Apply topically 2 (two) times daily. 1:1 compound with Eucerin Patient taking differently: Apply 1 application topically 2 (two) times daily as needed (rash). Compounded as a 1:1 mixture with Eucerin. 12/23/14   Roderick Pee, MD   Physical Exam: Blood pressure (!) 155/82, pulse 93, temperature 98.8 F (37.1 C), temperature source Oral, resp. rate 14, SpO2 98 %. 1. General:  in No Acute distress  Chronically ill -appearing 2. Psychological: Alert but not Oriented 3. Head/ENT:    Dry Mucous Membranes                          Head Non traumatic, neck supple                          Poor Dentition 4. SKIN:  decreased Skin turgor,  Skin clean Dry and intact no rash 5. Heart: Regular rate and rhythm no Murmur, no Rub or gallop 6. Lungs no wheezes or crackles   7. Abdomen: Soft, some tenderness ,  Distended bowel sounds present 8. Lower extremities: no clubbing,  cyanosis, or  edema 9. Neurologically Grossly intact, moving all 4 extremities equally  10. MSK: Normal range of motion   LABS:     Recent Labs  Lab 01/13/18 1747 01/13/18 1757  WBC 13.6*  --   NEUTROABS 11.8*  --   HGB 11.7* 12.2*  HCT 35.3* 36.0*  MCV 90.3  --   PLT 311  --    Basic Metabolic Panel: Recent Labs  Lab 01/13/18 1747 01/13/18 1757  NA 141 140  K 4.4 4.3  CL 102 102  CO2 26  --   GLUCOSE 119* 117*  BUN 32* 33*  CREATININE 1.44* 1.50*  CALCIUM 9.2  --       Recent Labs  Lab 01/13/18 1747  AST 25  ALT 16  ALKPHOS 39  BILITOT 0.8  PROT 7.6  ALBUMIN 3.7   No results for input(s): LIPASE, AMYLASE in the last 168 hours. No results for input(s): AMMONIA in the last 168 hours.    HbA1C: No results for input(s): HGBA1C in the last 72 hours. CBG: Recent Labs  Lab 01/13/18 1617  GLUCAP 107*      Urine analysis:    Component Value Date/Time   COLORURINE YELLOW 01/13/2018 1923   APPEARANCEUR CLOUDY (A) 01/13/2018 1923   LABSPEC 1.009 01/13/2018 1923   PHURINE 5.0 01/13/2018 1923   GLUCOSEU 150 (A) 01/13/2018 1923   GLUCOSEU NEGATIVE 09/30/2008 0851   HGBUR LARGE (A) 01/13/2018 1923   HGBUR trace-lysed 03/29/2010 0940   BILIRUBINUR NEGATIVE 01/13/2018 1923   BILIRUBINUR neg 01/13/2014 1141   KETONESUR NEGATIVE 01/13/2018 1923   PROTEINUR NEGATIVE 01/13/2018 1923   UROBILINOGEN 1.0 01/13/2014 1141   UROBILINOGEN 0.2 03/29/2010 0940   NITRITE NEGATIVE 01/13/2018 1923   LEUKOCYTESUR NEGATIVE 01/13/2018 1923       Cultures:    Component Value Date/Time   SDES  10/01/2017 0008    BLOOD LEFT HAND Performed at [facility], [address] [place], [place] [address]    SPECREQUEST  10/01/2017 0008    IN PEDIATRIC BOTTLE Blood Culture adequate volume Performed at [facility], [address] [place], [place] [address]    CULT  10/01/2017 0008    NO GROWTH 5 DAYS Performed at [facility], [address] [place], [place] [address]    REPTSTATUS 10/06/2017 FINAL 10/01/2017 0008     Radiological Exams on Admission: Ct Head Wo Contrast  Result Date: 01/13/2018 CLINICAL DATA:  Weakness.  Mental status changes.  Diabetes. EXAM: CT HEAD WITHOUT CONTRAST TECHNIQUE: Contiguous axial images were obtained from the base of the skull through the vertex without intravenous contrast. COMPARISON:  10/15/2017 CT.  10/01/2017 MRI. FINDINGS: Brain: Generalized atrophy. Advanced chronic small-vessel ischemic changes of the cerebral hemispheric white matter. No sign of acute infarction, mass lesion, hemorrhage, hydrocephalus or extra-axial collection. Vascular: There is atherosclerotic calcification of the major vessels at the base of the brain. Skull: Negative Sinuses/Orbits: Clear/normal Other: None IMPRESSION: Atrophy and chronic small-vessel ischemic changes. No acute finding by CT. Electronically Signed   By: Paulina FusiMark  Shogry M.D.   On: 01/13/2018 18:55   Dg Chest Port 1 View  Result Date: 01/13/2018 CLINICAL DATA:  Low blood sugar last evening with dyspnea. EXAM: PORTABLE CHEST 1 VIEW COMPARISON:  10/15/2017 FINDINGS: Heart size is normal. There is mild aortic atherosclerosis without aneurysm. Mild pulmonary hyperinflation is redemonstrated without acute pulmonary consolidation. Right axillary arterial calcifications are noted. No acute nor suspicious osseous lesions. IMPRESSION: Hyperinflated lungs without acute pulmonary consolidation or CHF. Aortic atherosclerosis. Electronically Signed   By: Tollie Ethavid  Kwon M.D.   On: 01/13/2018 17:37   Ct Renal Stone Study  Result Date: 01/13/2018 CLINICAL DATA:  Hematuria and leukocytosis. EXAM: CT ABDOMEN AND PELVIS WITHOUT CONTRAST TECHNIQUE: Multidetector CT imaging of the abdomen and pelvis was performed following the standard protocol without IV contrast. COMPARISON:  None. FINDINGS: Lower chest: Normal size heart without pericardial effusion. Mild  peribronchial thickening to the lower lobes. Minimal bibasilar atelectasis. Hepatobiliary: Unremarkable unenhanced appearance of the liver. No biliary dilatation. Physiologic distention of the  gallbladder. No cholelithiasis. Subtle dependent hyperdensity within the gallbladder may represent biliary sludge. Pancreas: Atrophic without mass or ductal dilatation. Spleen: Normal Adrenals/Urinary Tract: Marked distention of the urinary bladder to above the level of the umbilicus measuring 19.5 x 12.9 x 14.5 cm (volume = 1910 cm^3). No focal mural thickening or calculi. Dilatation of the renal collecting systems bilaterally may be secondary to the full bladder. No obstructing calculus or definite intramural mass is apparent. Nonspecific mild perinephric fat stranding is seen bilaterally. Urinary tract infection it is not entirely excluded. Normal bilateral adrenal glands. Stomach/Bowel: There is a significant amount of fecal retention throughout the colon fecal impaction in the rectum. Stercoral proctitis is also raised given mild moderate transmural thickening of the rectum. No small bowel dilatation. The stomach is physiologically distended with ingested liquids. A hyperdensity within the dependent aspect of the stomach may represent ingested pill. Vascular/Lymphatic: Moderate aortoiliac and branch vessel atherosclerosis. Lymphadenopathy. Reproductive: Prostate is normal size. Seminal vesicles a partially calcified in appearance. Other: No free air nor free fluid. Musculoskeletal: Degenerative disc disease L5-S1. No aggressive osseous lesions. IMPRESSION: Marked urinary bladder distention with overall volume approximately 1.91 L. No obstructing calculus prostatomegaly is identified. Question neurogenic bladder. This is resulting in moderate hydroureteronephrosis. Perinephric fat stranding is seen which is nonspecific but can be seen in urinary tract infections and therefore clinical correlation is recommended. Subtle  hyperdensity along the dependent aspect of the gallbladder may represent biliary sludge. No calculi are identified. No secondary signs of acute cholecystitis. Increased fecal retention throughout the colon consistent constipation. Fecal impaction is also noted in the rectal vault with mild moderate transmural thickening of the rectum raising the possibility a stercoral proctitis Electronically Signed   By: Tollie Eth M.D.   On: 01/13/2018 22:26    Chart has been reviewed    Assessment/Plan   72 y.o. male with medical history significant of  DM2, COPD, HTN, diabetic neuropathy   Admitted for AKI in the setting of urinary retention Resulting in acute metabolic encephalopathy  Present on Admission: . Acute metabolic encephalopathy -   - most likely multifactorial secondary to combination of   mild dehydration secondary to decreased by mouth intake,  polypharmacy urinary retention  - Will rehydrate  Place Foley catheter  - Hold contributing medications   - if no improvement may need further imaging to evaluate for CNS pathology. Given that patient was noted to be leaning to one side and unable to provide any detailed history with acute neurological changes will order MRI brain  if able to tolerate in a.m.  Marland Kitchen AKI (acute kidney injury) (HCC) in the setting of urinary retention will Place Foley catheter treat obstipation will initiate Flomax if no improvement despite treatment of obstipation may need urological follow-up  . Chronic pain disorder avoid narcotics given severe obstipation and altered mental status  . COPD (chronic obstructive pulmonary disease) (HCC) -continue home medications check VBG no evidence of hypercarbia   . Diabetes mellitus type 2 with neurological manifestations (HCC) -hold Neurontin for today, given recurrent hypoglycemia with decreased p.o. intake and worsening renal function will decrease Lantus dose to 10 starting tomorrow given recurrent hypoglycemia we will hold  off on Lantus today  . Essential hypertension stable restart home medications when able to tolerate  . Hyperlipidemia associated with type 2 diabetes mellitus (HCC) stable restart home medications when able to tolerate  . Acute urinary retention order Foley catheter start Flomax    . Obstipation -worrisome for stercoral proctitis,  will order bowel regimen and enema if no improvement will need GI consult for flex sig   Other plan as per orders.  DVT prophylaxis:    Lovenox     Code Status:  FULL CODE as per  family  I had personally discussed CODE STATUS with patient and family  Family Communication:   Family  at  Bedside  plan of care was discussed with  Wife  Mother in law  Disposition Plan:       To home once workup is complete and patient is stable                      Would benefit from PT/OT eval prior to DC  Ordered                   Swallow eval - SLP ordered                                     Wound care  consulted                                  Consults called: none  Admission status:   inpatient       Level of care         SDU      Kaneisha Ellenberger 01/14/2018, 12:04 AM    [facility]  Pager [phone]   after 2 AM please page floor coverage PA If 7AM-7PM, please contact the day team taking care of the patient  [email or web address]  Password [email or web address]

## 2018-01-13 NOTE — ED Triage Notes (Signed)
Pt wife reports that patient had low blood sugar last night and EMS was called out. Pt was given stuff then got sugar to 600. Pt not able to answer questions and not himself since this morning around 9am when she tried to get him up.

## 2018-01-13 NOTE — ED Notes (Signed)
Patient in and out cath- urine obtained.

## 2018-01-13 NOTE — ED Provider Notes (Signed)
[facility]-EMERGENCY DEPT Provider Note   CSN: [id number] Arrival date & time: 01/13/18  1610     History   Chief Complaint Chief Complaint  Patient presents with  . Altered Mental Status    HPI Jeremy Gill is a 72 y.o. male.  Patient's wife states that yesterday morning his blood sugar was 50 and then in the evening it was over 600.  EMS came to see him and she gave him an extra shot of insulin.  This morning the blood sugar was 71 but he was more confused than normal.  Also patient has been weak in his legs for 2 days  The history is provided by the patient and a relative. No language interpreter was used.  Weakness  Primary symptoms include no loss of sensation. This is a new problem. The current episode started 2 days ago. The problem has not changed since onset.There was no focality noted. There has been no fever. Pertinent negatives include no shortness of breath, no chest pain and no headaches. There were no medications administered prior to arrival. Associated medical issues do not include trauma.    Past Medical History:  Diagnosis Date  . ANKLE SPRAIN 12/16/2006   Qualifier: Diagnosis of  By: Tawanna Coolerodd MD, Eugenio HoesJeffrey A   . ANXIETY 10/25/2006  . CELLULITIS, LEG, RIGHT 09/08/2007   Qualifier: Diagnosis of  By: Tawanna Coolerodd MD, Eugenio HoesJeffrey A   . Chronic pain disorder 11/30/2015  . COPD 10/25/2006  . 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 DIABETES MELLITUS, TYPE I 10/25/2006  . Diabetic polyneuropathy (HCC) 04/08/2007   Qualifier: Diagnosis of  By: Tawanna Coolerodd MD, Eugenio HoesJeffrey A   . 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 Essential hypertension 10/25/2006   Qualifier: Diagnosis of  By: Rosette Revealempleton, RN, Jorene MinorsAngela Dawn   . Generalized osteoarthritis of multiple sites 11/30/2015  . HYPERTENSION 10/25/2006  . 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 Low  back pain with radiation 01/23/2016  . Polyneuropathy in diabetes(357.2) 04/08/2007  . Psoriasis   . TOBACCO ABUSE 07/08/2007  . VENOUS INSUFFICIENCY 11/25/2007    Patient Active Problem List   Diagnosis Date Noted  . Acute urinary retention 01/13/2018  . Chronic pain syndrome 11/01/2017  . Pressure injury of skin 10/02/2017  . Anemia   . Depression 09/30/2017  . Acute renal failure superimposed on stage 3 chronic kidney disease (HCC) 09/30/2017  . Sepsis (HCC) 09/30/2017  . Hypokalemia 09/30/2017  . Hyperlipidemia associated with type 2 diabetes mellitus (HCC) 02/05/2017  . AKI (acute kidney injury) (HCC) 12/19/2016  . Acute metabolic encephalopathy 12/19/2016  . DKA (diabetic ketoacidoses) (HCC) 12/18/2016  . Hyperkalemia 12/18/2016  . Anxiety disorder, unspecified 02/08/2016  . Low back pain with radiation 01/23/2016  . Chronic pain disorder 11/30/2015  . Generalized osteoarthritis of multiple sites 11/30/2015  . DYSHIDROSIS 06/08/2008  . VENOUS INSUFFICIENCY 11/25/2007  . 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 TOBACCO ABUSE 07/08/2007  . Diabetic polyneuropathy (HCC) 04/08/2007  . 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 Essential hypertension 10/25/2006  . COPD (chronic obstructive pulmonary disease) (HCC) 10/25/2006    Past Surgical History:  Procedure Laterality Date  . TESTICLE REMOVAL     R testicle        Home Medications    Prior to Admission medications   Medication Sig  Start Date End Date Taking? Authorizing Provider  aspirin 81 MG chewable tablet Chew 81 mg by mouth 2 (two) times a week. Pt takes on Monday and Friday.   Yes [provider]  atorvastatin (LIPITOR) 40 MG tablet Take 1 tablet (40 mg total) by mouth daily. 12/23/17 03/23/18 Yes [place], [name], MD  baclofen (LIORESAL) 10 MG tablet TAKE 1 TABLET BY MOUTH 3 TIMES DAILY 11/27/17  Yes Marcello Fennel, MD  collagenase (SANTYL) ointment Apply 1  application topically daily. 09/16/17  Yes Felecia Shelling, DPM  diclofenac (VOLTAREN) 50 MG EC tablet Take 1 tablet (50 mg total) by mouth 3 (three) times daily. 11/27/17  Yes Marcello Fennel, MD  DULoxetine (CYMBALTA) 60 MG capsule TAKE 1 CAPSULE BY MOUTH EVERY DAY 11/27/17  Yes Marcello Fennel, MD  gabapentin (NEURONTIN) 600 MG tablet Take 1 tablet (600 mg total) by mouth 3 (three) times daily. 10/04/17  Yes Sheikh, Omair Latif, DO  Insulin Glargine (LANTUS SOLOSTAR) 100 UNIT/ML Solostar Pen Inject 20-25 units into skin at 10 pm daily. If blood sugar is over 200, increase by 5 units every 1 to 2 weeks. 01/10/18  Yes Gordy Savers, MD  metFORMIN (GLUCOPHAGE) 500 MG tablet Take 2 tablets (1,000 mg total) by mouth 2 (two) times daily with a meal. 11/26/17  Yes [place], [name], MD  metoprolol succinate (TOPROL-XL) 25 MG 24 hr tablet Take 0.5 tablets (12.5 mg total) by mouth daily. 02/06/17  Yes [place], [name], MD  traMADol (ULTRAM) 50 MG tablet TAKE 1 TABLET BY MOUTH EVERY 6 HOURS as needed for pain 12/11/17  Yes Marcello Fennel, MD  ACCU-CHEK [facility] LANCETS lancets Use as instructed 01/08/18   [place], [name], MD  fluocinonide cream (LIDEX) 0.05 % Apply topically 3 (three) times daily as needed. For itching Patient taking differently: Apply 1 application topically 3 (three) times daily as needed (for itching).  12/23/14   Roderick Pee, MD  glucose blood test strip Use to test blood sugar three-four times daily. 02/06/17   [place], [name], MD  Insulin Syringe-Needle U-100 (INSULIN SYRINGE .5CC/31GX5/16") 31G X 5/16" 0.5 ML MISC USE AS DIRECTED WITH LANTUS 01/08/18   [place], [name], MD  ipratropium-albuterol (DUONEB) 0.5-2.5 (3) MG/3ML SOLN Take 3 mLs by nebulization every 6 (six) hours as needed. Patient taking differently: Take 3 mLs by nebulization every 6 (six) hours as needed (sob and wheezing).  10/04/17   Marguerita Merles Latif, DO  triamcinolone ointment (KENALOG) 0.1 % Apply topically 2 (two)  times daily. 1:1 compound with Eucerin Patient taking differently: Apply 1 application topically 2 (two) times daily as needed (rash). Compounded as a 1:1 mixture with Eucerin. 12/23/14   Roderick Pee, MD    Family History Family History  Problem Relation Age of Onset  . Cancer Mother        Unknown   . Diabetes Sister     Social History Social History   Tobacco Use  . Smoking status: Current Every Day Smoker    Types: Pipe    Last attempt to quit: 05/29/2011    Years since quitting: 6.6  . Smokeless tobacco: Never Used  Substance Use Topics  . Alcohol use: No  . Drug use: No     Allergies   Patient has no known allergies.   Review of Systems Review of Systems  Constitutional: Negative for appetite change and fatigue.  HENT: Negative for congestion, ear discharge and sinus pressure.  Eyes: Negative for discharge.  Respiratory: Negative for cough and shortness of breath.   Cardiovascular: Negative for chest pain.  Gastrointestinal: Negative for abdominal pain and diarrhea.  Genitourinary: Negative for frequency and hematuria.  Musculoskeletal: Negative for back pain.  Skin: Negative for rash.  Neurological: Positive for weakness. Negative for seizures and headaches.  Psychiatric/Behavioral: Negative for hallucinations.     Physical Exam Updated Vital Signs BP (!) 169/91   Pulse 95   Temp 98.8 F (37.1 C) (Oral)   Resp (!) 28   SpO2 99%   Physical Exam  Constitutional: He is oriented to person, place, and time. He appears well-developed.  HENT:  Head: Normocephalic.  Eyes: Conjunctivae and EOM are normal. No scleral icterus.  Neck: Neck supple. No thyromegaly present.  Cardiovascular: Normal rate and regular rhythm. Exam reveals no gallop and no friction rub.  No murmur heard. Pulmonary/Chest: No stridor. He has no wheezes. He has no rales. He exhibits no tenderness.  Abdominal: He exhibits distension. There is no tenderness. There is no rebound.    Musculoskeletal: Normal range of motion. He exhibits no edema.  Lymphadenopathy:    He has no cervical adenopathy.  Neurological: He is oriented to person, place, and time. He exhibits normal muscle tone. Coordination normal.  Mild confusion  Skin: No rash noted. No erythema.  Psychiatric: He has a normal mood and affect. His behavior is normal.     ED Treatments / Results  Labs (all labs ordered are listed, but only abnormal results are displayed) Labs Reviewed  COMPREHENSIVE METABOLIC PANEL - Abnormal; Notable for the following components:      Result Value   Glucose, Bld 119 (*)    BUN 32 (*)    Creatinine, Ser 1.44 (*)    GFR calc non Af Amer 47 (*)    GFR calc Af Amer 55 (*)    All other components within normal limits  CBC - Abnormal; Notable for the following components:   WBC 13.6 (*)    RBC 3.91 (*)    Hemoglobin 11.7 (*)    HCT 35.3 (*)    All other components within normal limits  URINALYSIS, ROUTINE W REFLEX MICROSCOPIC - Abnormal; Notable for the following components:   APPearance CLOUDY (*)    Glucose, UA 150 (*)    Hgb urine dipstick LARGE (*)    RBC / HPF >50 (*)    Bacteria, UA RARE (*)    All other components within normal limits  DIFFERENTIAL - Abnormal; Notable for the following components:   Neutro Abs 11.8 (*)    All other components within normal limits  CBG MONITORING, ED - Abnormal; Notable for the following components:   Glucose-Capillary 107 (*)    All other components within normal limits  I-STAT CHEM 8, ED - Abnormal; Notable for the following components:   BUN 33 (*)    Creatinine, Ser 1.50 (*)    Glucose, Bld 117 (*)    Calcium, Ion 1.13 (*)    Hemoglobin 12.2 (*)    HCT 36.0 (*)    All other components within normal limits  URINE CULTURE  CBG MONITORING, ED  I-STAT TROPONIN, ED    EKG EKG Interpretation  Date/Time:  Monday January 13 2018 16:33:32 EDT Ventricular Rate:  98 PR Interval:    QRS Duration: 89 QT  Interval:  345 QTC Calculation: 441 R Axis:   73 Text Interpretation:  Sinus rhythm Short PR interval RSR' in V1 or V2,  probably normal variant Borderline ST depression, diffuse leads Baseline wander in lead(s) V2 Confirmed by Bethann BerkshireZammit, Joyous Gleghorn [phone]([id number]) on 01/13/2018 8:09:23 PM   Radiology Ct Head Wo Contrast  Result Date: 01/13/2018 CLINICAL DATA:  Weakness.  Mental status changes.  Diabetes. EXAM: CT HEAD WITHOUT CONTRAST TECHNIQUE: Contiguous axial images were obtained from the base of the skull through the vertex without intravenous contrast. COMPARISON:  10/15/2017 CT.  10/01/2017 MRI. FINDINGS: Brain: Generalized atrophy. Advanced chronic small-vessel ischemic changes of the cerebral hemispheric white matter. No sign of acute infarction, mass lesion, hemorrhage, hydrocephalus or extra-axial collection. Vascular: There is atherosclerotic calcification of the major vessels at the base of the brain. Skull: Negative Sinuses/Orbits: Clear/normal Other: None IMPRESSION: Atrophy and chronic small-vessel ischemic changes. No acute finding by CT. Electronically Signed   By: Paulina FusiMark  Shogry M.D.   On: 01/13/2018 18:55   Dg Chest Port 1 View  Result Date: 01/13/2018 CLINICAL DATA:  Low blood sugar last evening with dyspnea. EXAM: PORTABLE CHEST 1 VIEW COMPARISON:  10/15/2017 FINDINGS: Heart size is normal. There is mild aortic atherosclerosis without aneurysm. Mild pulmonary hyperinflation is redemonstrated without acute pulmonary consolidation. Right axillary arterial calcifications are noted. No acute nor suspicious osseous lesions. IMPRESSION: Hyperinflated lungs without acute pulmonary consolidation or CHF. Aortic atherosclerosis. Electronically Signed   By: Tollie Ethavid  Kwon M.D.   On: 01/13/2018 17:37    Procedures Procedures (including critical care time)  Medications Ordered in ED Medications  sodium chloride 0.9 % bolus 1,000 mL (0 mLs Intravenous Stopped 01/13/18 2034)     Initial Impression /  Assessment and Plan / ED Course  I have reviewed the triage vital signs and the nursing notes.  Pertinent labs & imaging results that were available during my care of the patient were reviewed by me and considered in my medical decision making (see chart for details).     Patient had and out cath done.  Patient had 1100 cc removed.  Patient's white count is 13,000 he has mild AKI urinalysis shows some hematuria.  He will be admitted to medicine  Final Clinical Impressions(s) / ED Diagnoses   Final diagnoses:  Weakness  Hematuria, gross    ED Discharge Orders    None       Bethann BerkshireZammit, Brexton Sofia, MD 01/13/18 2105

## 2018-01-14 ENCOUNTER — Inpatient Hospital Stay (HOSPITAL_COMMUNITY): Payer: PPO

## 2018-01-14 LAB — GLUCOSE, CAPILLARY
GLUCOSE-CAPILLARY: 124 mg/dL — AB (ref 70–99)
GLUCOSE-CAPILLARY: 219 mg/dL — AB (ref 70–99)
GLUCOSE-CAPILLARY: 64 mg/dL — AB (ref 70–99)
GLUCOSE-CAPILLARY: 94 mg/dL (ref 70–99)
Glucose-Capillary: 144 mg/dL — ABNORMAL HIGH (ref 70–99)
Glucose-Capillary: 219 mg/dL — ABNORMAL HIGH (ref 70–99)
Glucose-Capillary: 267 mg/dL — ABNORMAL HIGH (ref 70–99)

## 2018-01-14 LAB — MAGNESIUM: Magnesium: 1.7 mg/dL (ref 1.7–2.4)

## 2018-01-14 LAB — CBC
HCT: 32.8 % — ABNORMAL LOW (ref 39.0–52.0)
Hemoglobin: 11 g/dL — ABNORMAL LOW (ref 13.0–17.0)
MCH: 30.2 pg (ref 26.0–34.0)
MCHC: 33.5 g/dL (ref 30.0–36.0)
MCV: 90.1 fL (ref 78.0–100.0)
Platelets: 302 10*3/uL (ref 150–400)
RBC: 3.64 MIL/uL — ABNORMAL LOW (ref 4.22–5.81)
RDW: 14.2 % (ref 11.5–15.5)
WBC: 12.7 10*3/uL — ABNORMAL HIGH (ref 4.0–10.5)

## 2018-01-14 LAB — COMPREHENSIVE METABOLIC PANEL
ALBUMIN: 3.2 g/dL — AB (ref 3.5–5.0)
ALK PHOS: 35 U/L — AB (ref 38–126)
ALT: 13 U/L (ref 0–44)
AST: 21 U/L (ref 15–41)
Anion gap: 8 (ref 5–15)
BILIRUBIN TOTAL: 0.8 mg/dL (ref 0.3–1.2)
BUN: 27 mg/dL — AB (ref 8–23)
CALCIUM: 9.2 mg/dL (ref 8.9–10.3)
CO2: 30 mmol/L (ref 22–32)
Chloride: 108 mmol/L (ref 98–111)
Creatinine, Ser: 1.21 mg/dL (ref 0.61–1.24)
GFR calc Af Amer: >60 mL/min (ref >60)
GFR calc non Af Amer: 58 mL/min — ABNORMAL LOW (ref >60)
GLUCOSE: 74 mg/dL (ref 70–99)
Potassium: 4.5 mmol/L (ref 3.5–5.1)
Sodium: 146 mmol/L — ABNORMAL HIGH (ref 135–145)
TOTAL PROTEIN: 6.7 g/dL (ref 6.5–8.1)

## 2018-01-14 LAB — PHOSPHORUS: Phosphorus: 4.7 mg/dL — ABNORMAL HIGH (ref 2.5–4.6)

## 2018-01-14 LAB — HEMOGLOBIN A1C
Hgb A1c MFr Bld: 9.1 % — ABNORMAL HIGH (ref 4.8–5.6)
Mean Plasma Glucose: 214.47 mg/dL

## 2018-01-14 LAB — CREATININE, URINE, RANDOM: CREATININE, URINE: 64.67 mg/dL

## 2018-01-14 LAB — SODIUM, URINE, RANDOM: SODIUM UR: 29 mmol/L

## 2018-01-14 LAB — MRSA PCR SCREENING: MRSA BY PCR: NEGATIVE

## 2018-01-14 LAB — TSH: TSH: 0.76 u[IU]/mL (ref 0.350–4.500)

## 2018-01-14 MED ORDER — INSULIN ASPART 100 UNIT/ML ~~LOC~~ SOLN
0.0000 [IU] | SUBCUTANEOUS | Status: DC
  Administered 2018-01-14: 5 [IU] via SUBCUTANEOUS
  Administered 2018-01-14: 3 [IU] via SUBCUTANEOUS
  Administered 2018-01-15 (×2): 5 [IU] via SUBCUTANEOUS
  Administered 2018-01-15 (×2): 3 [IU] via SUBCUTANEOUS
  Administered 2018-01-15 (×2): 2 [IU] via SUBCUTANEOUS
  Administered 2018-01-16: 3 [IU] via SUBCUTANEOUS
  Administered 2018-01-16: 2 [IU] via SUBCUTANEOUS
  Administered 2018-01-16: 7 [IU] via SUBCUTANEOUS

## 2018-01-14 MED ORDER — IPRATROPIUM-ALBUTEROL 0.5-2.5 (3) MG/3ML IN SOLN: 3.0000 mL | Freq: Four times a day (QID) | RESPIRATORY_TRACT | Status: DC | PRN

## 2018-01-14 MED ORDER — ACETAMINOPHEN 325 MG PO TABS
650.0000 mg | ORAL_TABLET | Freq: Four times a day (QID) | ORAL | Status: DC | PRN
  Administered 2018-01-15: 650 mg via ORAL
  Filled 2018-01-14: qty 2

## 2018-01-14 MED ORDER — ONDANSETRON HCL 4 MG PO TABS: 4.0000 mg | ORAL_TABLET | Freq: Four times a day (QID) | ORAL | Status: DC | PRN

## 2018-01-14 MED ORDER — LACTULOSE 10 GM/15ML PO SOLN
10.0000 g | Freq: Once | ORAL | Status: AC
  Administered 2018-01-14: 10 g via ORAL
  Filled 2018-01-14: qty 15

## 2018-01-14 MED ORDER — INSULIN GLARGINE 100 UNIT/ML ~~LOC~~ SOLN
10.0000 [IU] | Freq: Every day | SUBCUTANEOUS | Status: DC
  Administered 2018-01-14 – 2018-01-15 (×2): 10 [IU] via SUBCUTANEOUS
  Filled 2018-01-14 (×2): qty 0.1

## 2018-01-14 MED ORDER — METOPROLOL SUCCINATE ER 25 MG PO TB24
12.5000 mg | ORAL_TABLET | Freq: Every day | ORAL | Status: DC
  Administered 2018-01-14 – 2018-01-17 (×4): 12.5 mg via ORAL
  Filled 2018-01-14 (×4): qty 1

## 2018-01-14 MED ORDER — ACETAMINOPHEN 650 MG RE SUPP: 650.0000 mg | Freq: Four times a day (QID) | RECTAL | Status: DC | PRN

## 2018-01-14 MED ORDER — ONDANSETRON HCL 4 MG/2ML IJ SOLN: 4.0000 mg | Freq: Four times a day (QID) | INTRAMUSCULAR | Status: DC | PRN

## 2018-01-14 MED ORDER — SODIUM CHLORIDE 0.9 % IV SOLN
INTRAVENOUS | Status: DC
  Administered 2018-01-14: via INTRAVENOUS

## 2018-01-14 MED ORDER — DEXTROSE-NACL 5-0.45 % IV SOLN
INTRAVENOUS | Status: DC
  Administered 2018-01-14: 08:00:00 via INTRAVENOUS

## 2018-01-14 MED ORDER — DEXTROSE 50 % IV SOLN
INTRAVENOUS | Status: AC
  Administered 2018-01-14: 50 mL
  Filled 2018-01-14: qty 50

## 2018-01-14 NOTE — Consult Note (Signed)
   [facility] Consult   01/14/2018  Jeremy SalinesJames E Evrard 1946/03/17 [id number]   [facility]. Went to bedside to speak with Jeremy Gill about [facility]. He states he would rather discuss it over with his wife before consenting to [facility]. Provided [facility] broch[facility] with contact information. Jeremy Gill asks Clinical research associatewriter to come back at a later time.   Raiford NobleAtika Hall, MSN-Ed, RN,BSN [facility] Hospital Liaison [phone]

## 2018-01-14 NOTE — Evaluation (Signed)
Clinical/Bedside Swallow Evaluation Patient Details  Name: Jeremy Gill MRN: [id number] Date of Birth: Nov 22, 1945  Today's Date: 01/14/2018 Time: SLP Start Time (ACUTE ONLY): 0945 SLP Stop Time (ACUTE ONLY): 1030 SLP Time Calculation (min) (ACUTE ONLY): 45 min  Past Medical History:  Past Medical History:  Diagnosis Date  . ANKLE SPRAIN 12/16/2006   Qualifier: Diagnosis of  By: Jeremy Coolerodd MD, Jeremy Gill   . ANXIETY 10/25/2006  . CELLULITIS, LEG, RIGHT 09/08/2007   Qualifier: Diagnosis of  By: Jeremy Coolerodd MD, Jeremy Gill   . Chronic pain disorder 11/30/2015  . COPD 10/25/2006  . Diabetes mellitus type 2 with neurological manifestations (HCC) 10/25/2006   Qualifier: Diagnosis of  By: Jeremy Revealempleton, Jeremy Gill, Jeremy Gill   . DIABETES MELLITUS, TYPE I 10/25/2006  . Diabetic polyneuropathy (HCC) 04/08/2007   Qualifier: Diagnosis of  By: Jeremy Coolerodd MD, Jeremy Gill   . 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 Essential hypertension 10/25/2006   Qualifier: Diagnosis of  By: Jeremy Revealempleton, Jeremy Gill, Jeremy Gill   . Generalized osteoarthritis of multiple sites 11/30/2015  . HYPERTENSION 10/25/2006  . Hypertrophy of tongue papillae 11/11/2007   Qualifier: Diagnosis of  By: Jeremy Coolerodd MD, Jeremy Gill   . Low back pain with radiation 01/23/2016  . Polyneuropathy in diabetes(357.2) 04/08/2007  . Psoriasis   . TOBACCO ABUSE 07/08/2007  . VENOUS INSUFFICIENCY 11/25/2007   Past Surgical History:  Past Surgical History:  Procedure Laterality Date  . TESTICLE REMOVAL     R testicle   HPI:  72 year old male admitted 01/13/18 with AMS. PMH: DM, neuropathy, COPD, HTN. 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   Assessment / Plan / Recommendation Clinical Impression  Pt seen at bedside for assessment of swallow function and safety. Pt reports difficulty with whole meds. Dentition noted to be poor, with thick secretions on velar surface. Oral care completed with suction. Pt was given  trials of thin liquid, puree, and solid consistencies. Oral prep was extended with dry cracker, due to poor dentition. Softened solids were tolerated better. No overt s/s aspiration observed on any consistency. Jeremy Gill provided capsule with applesauce, which pt was unable to propel posteriorly to swallow. Pt eventually chewed up the capsule. Will begin Dys 2 diet (finely chopped) with thin liquids, recommend crushed meds. SLP  posted safe swallow precautions at Jeremy Gill and reviewed these with pt/family. SLP will follow for diet tolerance assessment and education.  SLP Visit Diagnosis: Dysphagia, unspecified (R13.10)    Aspiration Risk  Mild aspiration risk    Diet Recommendation Dysphagia 2 (Fine chop);Thin liquid   Liquid Administration via: Cup;Straw Medication Administration: Crushed with puree Supervision: Patient able to self feed;Intermittent supervision to cue for compensatory strategies Compensations: Minimize environmental distractions;Slow rate;Small sips/bites Postural Changes: Seated upright at 90 degrees;Remain upright for at least 30 minutes after po intake    Other  Recommendations Oral Care Recommendations: Oral care QID Other Recommendations: Have oral suction available   Follow up Recommendations (TBD)      Frequency and Duration min 1 x/week  1 week       Prognosis Prognosis for Safe Diet Advancement: Fair      Swallow Study   General Date of Onset: 01/13/18 HPI: 72 year old male admitted 01/13/18 with AMS. PMH: DM, neuropathy, COPD, HTN. 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 Type of Study: Bedside Swallow Evaluation Previous Swallow Assessment:  BSE May 2019 - normal swallow. Rec regular/thin liquids. No f/u Diet Prior to this Study: NPO Temperature Spikes Noted: No Respiratory Status: Room air History of Recent Intubation: No Behavior/Cognition: Alert;Cooperative;Pleasant  mood Oral Cavity Assessment: Dried secretions;Dry Oral Care Completed by SLP: Yes Oral Cavity - Dentition: Poor condition;Missing dentition Vision: Functional for self-feeding Self-Feeding Abilities: Able to feed self Patient Positioning: Upright in bed Baseline Vocal Quality: Normal Volitional Cough: Strong Volitional Swallow: Able to elicit    Oral/Motor/Sensory Function Overall Oral Motor/Sensory Function: Within functional limits   Ice Chips Ice chips: Not tested   Thin Liquid Thin Liquid: Within functional limits Presentation: Cup;Straw    Nectar Thick Nectar Thick Liquid: Not tested   Honey Thick Honey Thick Liquid: Not tested   Puree Puree: Within functional limits Presentation: Spoon;Self Fed   Solid    Jeremy Gill, [facility], CCC-SLP Speech Language Pathologist [phone] Solid: Within functional limits Presentation: Self Fed      Jeremy Gill, Jeremy Gill 01/14/2018,10:39 AM

## 2018-01-14 NOTE — Evaluation (Signed)
Occupational Therapy Evaluation Patient Details Name: Jeremy Gill MRN: [id number] DOB: 02/02/1946 Today's Date: 01/14/2018    History of Present Illness pt was admitted for acute metabolic encephalopathy.  He was admitted with similar dx in May '19.  PMH:  DM, HTN, COPD, chronic back pain and neuropathy.  Pressure R heel   Clinical Impression   This 72 year old man was admitted for the above. He had a recent admission (5/19) for similar dx.  Pt was mod I at baseline prior to admission. Pt presents with generalized weakness and decreased cognition which affect ADLs.  He will benefit from continued OT.  Will follow in acute setting.  Goals are for supervision to mod A.    Follow Up Recommendations  Supervision/Assistance - 24 hour;SNF    Equipment Recommendations  (? 3:1 commode, tba further)    Recommendations for Other Services       Precautions / Restrictions Precautions Precautions: Fall Restrictions Weight Bearing Restrictions: No      Mobility Bed Mobility Overal bed mobility: Needs Assistance Bed Mobility: Rolling;Sidelying to Sit Rolling: Min assist Sidelying to sit: Min assist;+2 for physical assistance       General bed mobility comments: assist for trunk and legs.  Used bedrail  Transfers Overall transfer level: Needs assistance Equipment used: Rolling walker (2 wheeled) Transfers: Sit to/from UGI CorporationStand;Stand Pivot Transfers Sit to Stand: Min assist;+2 safety/equipment Stand pivot transfers: Min assist;+2 safety/equipment       General transfer comment: assist to rise and steady.  Cues and assistance to move walker, reach for chair    Balance                                           ADL either performed or assessed with clinical judgement   ADL Overall ADL's : Needs assistance/impaired Eating/Feeding: Supervision/ safety(modified diet)   Grooming: Minimal assistance;Sitting   Upper Body Bathing: Minimal assistance;Sitting    Lower Body Bathing: Moderate assistance;Sit to/from stand;Maximal assistance   Upper Body Dressing : Moderate assistance   Lower Body Dressing: Total assistance   Toilet Transfer: Minimal assistance;+2 for safety/equipment;Stand-pivot;RW(chair)   Toileting- Clothing Manipulation and Hygiene: Maximal assistance;+2 for safety/equipment;Sit to/from stand         General ADL Comments: extra time for all activities     Vision         Perception     Praxis      Pertinent Vitals/Pain Pain Assessment: Faces Faces Pain Scale: Hurts whole lot Pain Location: buttocks Pain Descriptors / Indicators: Aching Pain Intervention(s): Limited activity within patient's tolerance;Monitored during session;Premedicated before session;Repositioned     Hand Dominance     Extremity/Trunk Assessment Upper Extremity Assessment Upper Extremity Assessment: Generalized weakness           Communication Communication Communication: Expressive difficulties   Cognition Arousal/Alertness: Awake/alert Behavior During Therapy: WFL for tasks assessed/performed Overall Cognitive Status: Impaired/Different from baseline                                 General Comments: decreased memory for PLOF, delayed processing/initiation.  Pt called for assistance right after therapists left:  unable to state needs.  "The little red man said (call light)...   General Comments  VSS    Exercises     Shoulder Instructions  Home Living Family/patient expects to be discharged to:: Private residence Living Arrangements: Spouse/significant other Available Help at Discharge: Family Type of Home: House Home Access: Stairs to enter Secretary/administratorntrance Stairs-Number of Steps: 2 Entrance Stairs-Rails: None Home Layout: One level     Bathroom Shower/Tub: Producer, television/film/videoWalk-in shower   Bathroom Toilet: Standard     Home Equipment: Gilmer MorCane - single point   Additional Comments: mother in law home with them.   Information is based on last admission. Pt is having difficulty with PLOF      Prior Functioning/Environment Level of Independence: Independent with assistive device(s)        Comments: mod I up until recently        OT Problem List: Decreased strength;Decreased activity tolerance;Impaired balance (sitting and/or standing);Decreased cognition;Decreased safety awareness;Decreased knowledge of use of DME or AE;Pain      OT Treatment/Interventions: Self-care/ADL training;DME and/or AE instruction;Patient/family education;Balance training;Cognitive remediation/compensation;Therapeutic activities;Energy conservation    OT Goals(Current goals can be found in the care plan section) Acute Rehab OT Goals Patient Stated Goal: none stated; agreeable to therapy OT Goal Formulation: With patient Time For Goal Achievement: 01/28/18 Potential to Achieve Goals: Good ADL Goals Pt Will Transfer to Toilet: with supervision;ambulating;regular height toilet;bedside commode(vs) Pt Will Perform Toileting - Clothing Manipulation and hygiene: with min guard assist;sit to/from stand;sitting/lateral leans Additional ADL Goal #1: pt will initiate actions within 5 seconds of cue Additional ADL Goal #2: pt will perform UB adls with set up/supervision Additional ADL Goal #3: pt will perform LB adls with mod A, sit to stand  OT Frequency: Min 2X/week   Barriers to D/C:            Co-evaluation              AM-PAC PT "6 Clicks" Daily Activity     Outcome Measure Help from another person eating meals?: A Little Help from another person taking care of personal grooming?: A Little Help from another person toileting, which includes using toliet, bedpan, or urinal?: A Lot Help from another person bathing (including washing, rinsing, drying)?: A Lot Help from another person to put on and taking off regular upper body clothing?: A Lot Help from another person to put on and taking off regular lower body  clothing?: Total 6 Click Score: 13   End of Session    Activity Tolerance: Patient limited by fatigue Patient left: in chair;with call bell/phone within reach;with chair alarm set  OT Visit Diagnosis: Unsteadiness on feet (R26.81);Muscle weakness (generalized) (M62.81);Other symptoms and signs involving cognitive function                Time: 1422-1445 OT Time Calculation (min): 23 min Charges:  OT General Charges $OT Visit: 1 Visit OT Evaluation $OT Eval Moderate Complexity: 1 [address]Mod  Stanley Lyness, OTR/L [phone] 01/14/2018  Jeremy Gill 01/14/2018, 3:07 PM

## 2018-01-14 NOTE — Care Management Note (Addendum)
Case Management Note  Patient Details  Name: Jeremy Gill MRN: [id number] Date of Birth: 09-08-1945  Subjective/Objective:                  dka  Action/Plan: followi9ng for progression and cm needs/diabetic-compliance?/ diabetic coordinator to see. thn referral Expected Discharge Date:  (unknown)               Expected Discharge Plan:     In-House Referral:     Discharge planning Services     Post Acute Care Choice:    Choice offered to:     DME Arranged:    DME Agency:     HH Arranged:    HH Agency:     Status of Service:     If discussed at [employer] Length of Stay Meetings, dates discussed:    Additional Comments:  Jeremy Gill, Jeremy Barbeau Lynn, RN 01/14/2018, 11:35 AM

## 2018-01-14 NOTE — Progress Notes (Signed)
PROGRESS NOTE    Jeremy Gill  OZH:[id number] DOB: May 23, 1946 DOA: 01/13/2018 PCP: [place], [name], MD   Brief Narrative:  HPI On 01/13/2018 by Dr. Marina Goodell Jeremy Gill is a 72 y.o. male with medical history significant of  DM2, COPD, HTN, diabetic neuropathy   Presented with wildly fluctuating blood glucose. Have been more confused as per family for the past 2 days. For the past 2 days have been unstable, too weak to get out of the bed. He has been leaning to his right. Wife noted left side of the face is droopy for the past 2-3 months. No new medications.   Wife checked his blood sugars yesterday was in  50s and went up to 600  With orange juice and some Candy called EMS insulin was administered in today in the morning again was hypoglycemic used to be on Lantus 20 units and metformin also has been dosing himself with with Victoza as needed since it is expensive not taking on a regular basis.  His blood sugar has been running in 170s to 200 and he was seen in July by his primary care provider who increased Lantus to 25 units and instructed to continue to titrate up by 5 units a week. Last night   wife gave 25 units of Lantus. She has been given mostly 20 units but occasionally has given him 25 units when BG has been running high.   Wife states for the past 2 days patient has not been able to urinate he has been trying to stand up to do so but could not empty his bladder. Assessment & Plan   Acute metabolic encephalopathy -Likely multifactorial including combination of dehydration with decreased oral intake -Mental status appears to be improving, although baseline is unknown -Patient is aware of himself, place, circumstance -Continue to treat underlying etiologies  Acute kidney injury in the setting of acute urinary retention -Foley catheter has been placed -Creatinine on admission 1.50, has improved mildly to 1.21 (baseline appears to be 0.9-1.1) -Continue IV fluids and  monitor BMP -?  Secondary to obstipation -UA showed rare bacteria, 6-10 WBC, negative leukocytes or nitrites. -Urine culture pending -CT renal stone study showed marked urinary bladder distention with 1.91 L.  No obstructing calculus prostamegaly identified.  Moderate hydroureteronephrosis.  Perinephric fat stranding seen which is nonspecific.  Increased fecal retention throughout the colon consistent with constipation.  Fecal impaction noted in the fecal vault with mild moderate mural thickening of the rectum, possibility of stercoral proctitis. -Placed on Flomax  Obstipation -Possibly secondary to narcotic use -Patient was given enema with minimal bowel movement -Will give small dose of lactulose -CT as above  Chronic pain disorder -Avoiding narcotics given severe obstipation and altered mental status  COPD  -Stable, continue DuoNeb treatments as needed  Diabetes mellitus, type II with neuropathy -Have placed on D5 half-normal saline given hypoglycemic episodes -Continue Lantus, insulin sliding scale and CBG monitoring  Essential hypertension -BP uncontrolled this morning, will restart home medications  Chronic normocytic anemia -Hemoglobin appears to be stable, continue to monitor CBC  Mild hyponatremia -Change patient's IV fluid to D5 half-normal, will continue to monitor BMP  DVT Prophylaxis SCDs  Code Status: Full  Family Communication: None at bedside  Disposition Plan: Admitted, continue to monitor.  Suspect discharged home within 24 to 48 hours.  Consultants None  Procedures  None  Antibiotics   Anti-infectives (From admission, onward)   None      Subjective:  Jeremy Gill seen and examined today.  Patient feeling better this morning.  Currently denies any chest pain, shortness breath, abdominal pain, nausea vomiting, diarrhea.  Does continue to feel constipated.  States he had a small bowel movement.  Denies dizziness or headache at this  time.  Objective:   Vitals:   01/14/18 0400 01/14/18 0750 01/14/18 0800 01/14/18 1200  BP: (!) 157/84  (!) 163/74   Pulse: 74  81   Resp: (!) 9  11   Temp:  97.8 F (36.6 C)  99.1 F (37.3 C)  TempSrc:  Oral  Oral  SpO2: 91%  98%   Weight:      Height:        Intake/Output Summary (Last 24 hours) at 01/14/2018 1332 Last data filed at 01/14/2018 0546 Gross per 24 hour  Intake 1408.77 ml  Output 3150 ml  Net -1741.23 ml   Filed Weights   01/14/18 0147  Weight: 70.1 kg    Exam  General: Well developed, well nourished, NAD, appears stated age  HEENT: NCAT, mucous membranes moist.   Neck: Supple  Cardiovascular: S1 S2 auscultated, no rubs, murmurs or gallops. Regular rate and rhythm.  Respiratory: Clear to auscultation bilaterally with equal chest rise  Abdomen: Soft, nontender, nondistended, + bowel sounds  Extremities: warm dry without cyanosis clubbing or edema  Neuro: AAOx3, nonfocal  Skin: Without rashes exudates or nodules  Psych: Normal affect and demeanor with intact judgement and insight   Data Reviewed: I have personally reviewed following labs and imaging studies  CBC: Recent Labs  Lab 01/13/18 1747 01/13/18 1757 01/14/18 0328  WBC 13.6*  --  12.7*  NEUTROABS 11.8*  --   --   HGB 11.7* 12.2* 11.0*  HCT 35.3* 36.0* 32.8*  MCV 90.3  --  90.1  PLT 311  --  302   Basic Metabolic Panel: Recent Labs  Lab 01/13/18 1747 01/13/18 1757 01/14/18 0328  NA 141 140 146*  K 4.4 4.3 4.5  CL 102 102 108  CO2 26  --  30  GLUCOSE 119* 117* 74  BUN 32* 33* 27*  CREATININE 1.44* 1.50* 1.21  CALCIUM 9.2  --  9.2  MG  --   --  1.7  PHOS  --   --  4.7*   GFR: Estimated Creatinine Clearance: 54.7 mL/min (by C-G formula based on SCr of 1.21 mg/dL). Liver Function Tests: Recent Labs  Lab 01/13/18 1747 01/14/18 0328  AST 25 21  ALT 16 13  ALKPHOS 39 35*  BILITOT 0.8 0.8  PROT 7.6 6.7  ALBUMIN 3.7 3.2*   No results for input(s): LIPASE,  AMYLASE in the last 168 hours. No results for input(s): AMMONIA in the last 168 hours. Coagulation Profile: No results for input(s): INR, PROTIME in the last 168 hours. Cardiac Enzymes: No results for input(s): CKTOTAL, CKMB, CKMBINDEX, TROPONINI in the last 168 hours. BNP (last 3 results) No results for input(s): PROBNP in the last 8760 hours. HbA1C: Recent Labs    01/14/18 0328  HGBA1C 9.1*   CBG: Recent Labs  Lab 01/13/18 2225 01/14/18 0018 01/14/18 0736 01/14/18 0813 01/14/18 1205  GLUCAP 86 94 64* 144* 124*   Lipid Profile: No results for input(s): CHOL, HDL, LDLCALC, TRIG, CHOLHDL, LDLDIRECT in the last 72 hours. Thyroid Function Tests: Recent Labs    01/14/18 0328  TSH 0.760   Anemia Panel: No results for input(s): VITAMINB12, FOLATE, FERRITIN, TIBC, IRON, RETICCTPCT in the last 72 hours. Urine analysis:  Component Value Date/Time   COLORURINE YELLOW 01/13/2018 1923   APPEARANCEUR CLOUDY (A) 01/13/2018 1923   LABSPEC 1.009 01/13/2018 1923   PHURINE 5.0 01/13/2018 1923   GLUCOSEU 150 (A) 01/13/2018 1923   GLUCOSEU NEGATIVE 09/30/2008 0851   HGBUR LARGE (A) 01/13/2018 1923   HGBUR trace-lysed 03/29/2010 0940   BILIRUBINUR NEGATIVE 01/13/2018 1923   BILIRUBINUR neg 01/13/2014 1141   KETONESUR NEGATIVE 01/13/2018 1923   PROTEINUR NEGATIVE 01/13/2018 1923   UROBILINOGEN 1.0 01/13/2014 1141   UROBILINOGEN 0.2 03/29/2010 0940   NITRITE NEGATIVE 01/13/2018 1923   LEUKOCYTESUR NEGATIVE 01/13/2018 1923   Sepsis Labs: @LABRCNTIP (procalcitonin:4,lacticidven:4)  ) Recent Results (from the past 240 hour(s))  MRSA PCR Screening     Status: None   Collection Time: 01/13/18 11:43 PM  Result Value Ref Range Status   MRSA by PCR NEGATIVE NEGATIVE Final    Comment:        The GeneXpert MRSA Assay (FDA approved for NASAL specimens only), is one component of a comprehensive MRSA colonization surveillance program. It is not intended to diagnose MRSA infection  nor to guide or monitor treatment for MRSA infections. Performed at [facility], [address] [place], [place] [address]       Radiology Studies: Dg Abd 1 View  Result Date: 01/14/2018 CLINICAL DATA:  Abdominal distension without tenderness. Reportedly normal bowel movements. Recent hyper and hypo glycemia with some mental status change. EXAM: ABDOMEN - 1 VIEW COMPARISON:  Abdominal CT scan of January 13, 2018. FINDINGS: The colonic stool burden is increased diffusely. The stool burden within the rectum is also increased. There are no abnormal soft tissue calcifications. The bony structures are unremarkable. IMPRESSION: Increased colonic stool burden compatible with constipation in the appropriate clinical setting. Increased rectal stool burden may reflect an impaction in the appropriate setting. No evidence of ileus, obstruction, or perforation. Electronically Signed   By: David  [place] M.D.   On: 01/14/2018 08:59   Ct Head Wo Contrast  Result Date: 01/13/2018 CLINICAL DATA:  Weakness.  Mental status changes.  Diabetes. EXAM: CT HEAD WITHOUT CONTRAST TECHNIQUE: Contiguous axial images were obtained from the base of the skull through the vertex without intravenous contrast. COMPARISON:  10/15/2017 CT.  10/01/2017 MRI. FINDINGS: Brain: Generalized atrophy. Advanced chronic small-vessel ischemic changes of the cerebral hemispheric white matter. No sign of acute infarction, mass lesion, hemorrhage, hydrocephalus or extra-axial collection. Vascular: There is atherosclerotic calcification of the major vessels at the base of the brain. Skull: Negative Sinuses/Orbits: Clear/normal Other: None IMPRESSION: Atrophy and chronic small-vessel ischemic changes. No acute finding by CT. Electronically Signed   By: Paulina FusiMark  Shogry M.D.   On: 01/13/2018 18:55   Mr Brain Wo Contrast  Result Date: 01/14/2018 CLINICAL DATA:  Altered mental status. Possible stroke. Diminished responsiveness. EXAM:  MRI HEAD WITHOUT CONTRAST TECHNIQUE: Multiplanar, multiecho pulse sequences of the brain and surrounding structures were obtained without intravenous contrast. COMPARISON:  CT 2019.  MRI 10/01/2017. FINDINGS: Brain: Diffusion imaging does not show any acute or subacute infarction. There are advanced chronic small-vessel ischemic changes throughout the pons in the cerebral hemispheric white matter. There are old lacunar infarctions affecting the thalami, basal ganglia and deep white matter. No large vessel territory infarction. No mass lesion, hemorrhage, hydrocephalus or extra-axial collection. Vascular: Major vessels at the base of the brain show flow. Skull and upper cervical spine: Negative Sinuses/Orbits: Clear/normal Other: None IMPRESSION: No change since the previous exams. No acute or subacute finding. 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 Electronically Signed   By: Paulina FusiMark  Shogry M.D.   On: 01/14/2018 07:31   Dg Chest Port 1 View  Result Date: 01/13/2018 CLINICAL DATA:  Low blood sugar last evening with dyspnea. EXAM: PORTABLE CHEST 1 VIEW COMPARISON:  10/15/2017 FINDINGS: Heart size is normal. There is mild aortic atherosclerosis without aneurysm. Mild pulmonary hyperinflation is redemonstrated without acute pulmonary consolidation. Right axillary arterial calcifications are noted. No acute nor suspicious osseous lesions. IMPRESSION: Hyperinflated lungs without acute pulmonary consolidation or CHF. Aortic atherosclerosis. Electronically Signed   By: Tollie Ethavid  Kwon M.D.   On: 01/13/2018 17:37   Ct Renal Stone Study  Result Date: 01/13/2018 CLINICAL DATA:  Hematuria and leukocytosis. EXAM: CT ABDOMEN AND PELVIS WITHOUT CONTRAST TECHNIQUE: Multidetector CT imaging of the abdomen and pelvis was performed following the standard protocol without IV contrast. COMPARISON:  None. FINDINGS: Lower chest: Normal  size heart without pericardial effusion. Mild peribronchial thickening to the lower lobes. Minimal bibasilar atelectasis. Hepatobiliary: Unremarkable unenhanced appearance of the liver. No biliary dilatation. Physiologic distention of the gallbladder. No cholelithiasis. Subtle dependent hyperdensity within the gallbladder may represent biliary sludge. Pancreas: Atrophic without mass or ductal dilatation. Spleen: Normal Adrenals/Urinary Tract: Marked distention of the urinary bladder to above the level of the umbilicus measuring 19.5 x 12.9 x 14.5 cm (volume = 1910 cm^3). No focal mural thickening or calculi. Dilatation of the renal collecting systems bilaterally may be secondary to the full bladder. No obstructing calculus or definite intramural mass is apparent. Nonspecific mild perinephric fat stranding is seen bilaterally. Urinary tract infection it is not entirely excluded. Normal bilateral adrenal glands. Stomach/Bowel: There is a significant amount of fecal retention throughout the colon fecal impaction in the rectum. Stercoral proctitis is also raised given mild moderate transmural thickening of the rectum. No small bowel dilatation. The stomach is physiologically distended with ingested liquids. A hyperdensity within the dependent aspect of the stomach may represent ingested pill. Vascular/Lymphatic: Moderate aortoiliac and branch vessel atherosclerosis. Lymphadenopathy. Reproductive: Prostate is normal size. Seminal vesicles a partially calcified in appearance. Other: No free air nor free fluid. Musculoskeletal: Degenerative disc disease L5-S1. No aggressive osseous lesions. IMPRESSION: Marked urinary bladder distention with overall volume approximately 1.91 L. No obstructing calculus prostatomegaly is identified. Question neurogenic bladder. This is resulting in moderate hydroureteronephrosis. Perinephric fat stranding is seen which is nonspecific but can be seen in urinary tract infections and therefore  clinical correlation is recommended. Subtle hyperdensity along the dependent aspect of the gallbladder may represent biliary sludge. No calculi are identified. No secondary signs of acute cholecystitis. Increased fecal retention throughout the colon consistent constipation. Fecal impaction is also noted in the rectal vault with mild moderate transmural thickening of the rectum raising the possibility a stercoral proctitis Electronically Signed   By: Tollie Ethavid  Kwon M.D.   On: 01/13/2018 22:26     Scheduled Meds: . insulin aspart  0-9 Units Subcutaneous Q4H  . insulin glargine  10 Units Subcutaneous QHS  . lactulose  10 g Oral Once  . tamsulosin  0.4 mg Oral QPC breakfast   Continuous Infusions: . dextrose 5 % and 0.45% NaCl 75 mL/hr at 01/14/18 0754     LOS: 1 day   Time Spent in minutes   45 minutes  Stefanny Pieri D.O. on 01/14/2018 at 1:32 PM  Between 7am to 7pm - Please see pager noted on [email or web address]  After 7pm go to [email or web address]  And look  for the night coverage person covering for me after hours  [facility] Office  [phone]

## 2018-01-14 NOTE — Progress Notes (Addendum)
Hypoglycemic Event  CBG: 64  Treatment: D50 IV 25 mL  Symptoms: None  Follow-up CBG: Time:0813 CBG Result:144  Possible Reasons for Event: Inadequate meal intake  Comments/MD notified: Orders for D5-0.45 Nacl placed    Schneur Crowson I Webb SilversmithWelch

## 2018-01-14 NOTE — Evaluation (Signed)
Physical Therapy Evaluation Patient Details Name: Jeremy Gill MRN: [id number] DOB: 13-Nov-1945 Today's Date: 01/14/2018   History of Present Illness  pt was admitted for acute metabolic encephalopathy.  He was admitted with similar dx in May '19.  PMH:  DM, HTN, COPD, chronic back pain and neuropathy.  Pressure R heel  Clinical Impression  The patient demonstrates confusion, decreased initiation. No family present for information. Pt admitted with above diagnosis. Pt currently with functional limitations due to the deficits listed below (see PT Problem List). Pt will benefit from skilled PT to increase their independence and safety with mobility to allow discharge to the venue listed below.      Follow Up Recommendations SNF; vs HHPT/Supervision/Assistance - 24 hour    Equipment Recommendations  (TBD)    Recommendations for Other Services       Precautions / Restrictions Precautions Precautions: Fall Restrictions Weight Bearing Restrictions: No      Mobility  Bed Mobility Overal bed mobility: Needs Assistance Bed Mobility: Rolling;Sidelying to Sit Rolling: Min assist Sidelying to sit: Min assist;+2 for physical assistance       General bed mobility comments: assist for trunk and legs.  Used bedrail  Transfers Overall transfer level: Needs assistance Equipment used: Rolling walker (2 wheeled) Transfers: Sit to/from [employer] CorporationStand;Stand Pivot Transfers Sit to Stand: Min assist;+2 safety/equipment Stand pivot transfers: Min assist;+2 safety/equipment       General transfer comment: assist to rise and steady.  Cues and assistance to move walker, reach for chair, multimodal cues to  initiate  Ambulation/Gait                Stairs            Wheelchair Mobility    Modified Rankin (Stroke Patients Only)       Balance                                             Pertinent Vitals/Pain Pain Assessment: Faces Faces Pain Scale: Hurts whole  lot Pain Location: buttocks Pain Descriptors / Indicators: Aching Pain Intervention(s): Limited activity within patient's tolerance;Monitored during session;Premedicated before session    Home Living Family/patient expects to be discharged to:: Private residence Living Arrangements: Spouse/significant other Available Help at Discharge: Family Type of Home: House Home Access: Stairs to enter Entrance Stairs-Rails: None Entrance Stairs-Number of Steps: 2 Home Layout: One level Home Equipment: Cane - single point Additional Comments: mother in law home with them.  Information is based on last admission. Pt is having difficulty with PLOF    Prior Function Level of Independence: Independent with assistive device(s)         Comments: mod I up until recently     Hand Dominance        Extremity/Trunk Assessment   Upper Extremity Assessment Upper Extremity Assessment: Generalized weakness    Lower Extremity Assessment Lower Extremity Assessment: Generalized weakness    Cervical / Trunk Assessment Cervical / Trunk Assessment: Kyphotic  Communication   Communication: Expressive difficulties  Cognition Arousal/Alertness: Awake/alert Behavior During Therapy: WFL for tasks assessed/performed Overall Cognitive Status: Impaired/Different from baseline Area of Impairment: Orientation                               General Comments: decreased memory for PLOF, delayed processing/initiation.  Pt called for  assistance right after therapists left:  unable to state needs.  "The little red man said (call light)...      General Comments General comments (skin integrity, edema, etc.): VSS    Exercises     Assessment/Plan    PT Assessment Patient needs continued PT services  PT Problem List Decreased strength;Decreased cognition;Decreased knowledge of use of DME;Decreased activity tolerance;Decreased safety awareness;Decreased skin integrity;Decreased knowledge of  precautions;Decreased balance;Decreased mobility;Pain       PT Treatment Interventions DME instruction;Gait training;Functional mobility training;Therapeutic activities;Therapeutic exercise;Patient/family education    PT Goals (Current goals can be found in the Care Plan section)  Acute Rehab PT Goals Patient Stated Goal: none stated; agreeable to therapy PT Goal Formulation: Patient unable to participate in goal setting Time For Goal Achievement: 01/28/18 Potential to Achieve Goals: Fair    Frequency Min 2X/week   Barriers to discharge        Co-evaluation PT/OT/SLP Co-Evaluation/Treatment: Yes Reason for Co-Treatment: For patient/therapist safety PT goals addressed during session: Mobility/safety with mobility         AM-PAC PT "6 Clicks" Daily Activity  Outcome Measure Difficulty turning over in bed (including adjusting bedclothes, sheets and blankets)?: A Lot Difficulty moving from lying on back to sitting on the side of the bed? : A Lot Difficulty sitting down on and standing up from a chair with arms (e.g., wheelchair, bedside commode, etc,.)?: Unable Help needed moving to and from a bed to chair (including a wheelchair)?: Total Help needed walking in hospital room?: Total Help needed climbing 3-5 steps with a railing? : Total 6 Click Score: 8    End of Session Equipment Utilized During Treatment: Gait belt Activity Tolerance: Patient limited by fatigue;Patient tolerated treatment well Patient left: in chair;with call bell/phone within reach;with chair alarm set Nurse Communication: Mobility status PT Visit Diagnosis: Unsteadiness on feet (R26.81);Pain    Time: 1415-1440 PT Time Calculation (min) (ACUTE ONLY): 25 min   Charges:   PT Evaluation $PT Eval Low Complexity: 1 Low         Rada HayHill, Gwyneth Fernandez [name] 01/14/2018, 3:23 PM Blanchard KelchKaren Clarance Bollard PT [phone]

## 2018-01-15 LAB — GLUCOSE, CAPILLARY
GLUCOSE-CAPILLARY: 169 mg/dL — AB (ref 70–99)
Glucose-Capillary: 179 mg/dL — ABNORMAL HIGH (ref 70–99)
Glucose-Capillary: 245 mg/dL — ABNORMAL HIGH (ref 70–99)
Glucose-Capillary: 276 mg/dL — ABNORMAL HIGH (ref 70–99)

## 2018-01-15 LAB — CBC
HCT: 28.9 % — ABNORMAL LOW (ref 39.0–52.0)
Hemoglobin: 9.8 g/dL — ABNORMAL LOW (ref 13.0–17.0)
MCH: 30.1 pg (ref 26.0–34.0)
MCHC: 33.9 g/dL (ref 30.0–36.0)
MCV: 88.7 fL (ref 78.0–100.0)
PLATELETS: 271 10*3/uL (ref 150–400)
RBC: 3.26 MIL/uL — AB (ref 4.22–5.81)
RDW: 14.1 % (ref 11.5–15.5)
WBC: 10.8 10*3/uL — AB (ref 4.0–10.5)

## 2018-01-15 LAB — BASIC METABOLIC PANEL
ANION GAP: 8 (ref 5–15)
BUN: 18 mg/dL (ref 8–23)
CALCIUM: 8.6 mg/dL — AB (ref 8.9–10.3)
CO2: 27 mmol/L (ref 22–32)
Chloride: 106 mmol/L (ref 98–111)
Creatinine, Ser: 0.83 mg/dL (ref 0.61–1.24)
GFR calc Af Amer: >60 mL/min (ref >60)
GFR calc non Af Amer: >60 mL/min (ref >60)
Glucose, Bld: 172 mg/dL — ABNORMAL HIGH (ref 70–99)
POTASSIUM: 3 mmol/L — AB (ref 3.5–5.1)
SODIUM: 141 mmol/L (ref 135–145)

## 2018-01-15 LAB — URINE CULTURE: Culture: NO GROWTH

## 2018-01-15 LAB — MAGNESIUM: Magnesium: 1.4 mg/dL — ABNORMAL LOW (ref 1.7–2.4)

## 2018-01-15 MED ORDER — ORAL CARE MOUTH RINSE
15.0000 mL | Freq: Two times a day (BID) | OROMUCOSAL | Status: DC
  Administered 2018-01-15 – 2018-01-17 (×3): 15 mL via OROMUCOSAL

## 2018-01-15 MED ORDER — METHYLNALTREXONE BROMIDE 12 MG/0.6ML ~~LOC~~ SOLN
12.0000 mg | Freq: Once | SUBCUTANEOUS | Status: AC
  Administered 2018-01-15: 12 mg via SUBCUTANEOUS
  Filled 2018-01-15: qty 0.6

## 2018-01-15 MED ORDER — ENSURE ENLIVE PO LIQD
237.0000 mL | Freq: Three times a day (TID) | ORAL | Status: DC
  Administered 2018-01-15 – 2018-01-16 (×3): 237 mL via ORAL

## 2018-01-15 MED ORDER — POTASSIUM CHLORIDE CRYS ER 20 MEQ PO TBCR
40.0000 meq | EXTENDED_RELEASE_TABLET | Freq: Once | ORAL | Status: AC
  Administered 2018-01-15: 40 meq via ORAL
  Filled 2018-01-15: qty 2

## 2018-01-15 MED ORDER — CHLORHEXIDINE GLUCONATE 0.12 % MT SOLN
15.0000 mL | Freq: Two times a day (BID) | OROMUCOSAL | Status: DC
  Administered 2018-01-15 – 2018-01-17 (×5): 15 mL via OROMUCOSAL
  Filled 2018-01-15 (×4): qty 15

## 2018-01-15 MED ORDER — HYDRALAZINE HCL 20 MG/ML IJ SOLN
10.0000 mg | Freq: Four times a day (QID) | INTRAMUSCULAR | Status: DC | PRN
  Administered 2018-01-15: 10 mg via INTRAVENOUS
  Filled 2018-01-15: qty 1

## 2018-01-15 NOTE — Progress Notes (Signed)
Initial Nutrition Assessment  DOCUMENTATION CODES:   Severe malnutrition in context of chronic illness  INTERVENTION:   Ensure Enlive po TID, each supplement provides 350 kcal and 20 grams of protein  NUTRITION DIAGNOSIS:   Severe Malnutrition related to chronic illness(COPD) as evidenced by energy intake < or equal to 75% for > or equal to 1 month, moderate fat depletion, moderate muscle depletion, percent weight loss.  GOAL:   Patient will meet greater than or equal to 90% of their needs   MONITOR:   PO intake, Supplement acceptance, Weight trends, Labs  REASON FOR ASSESSMENT:   Malnutrition Screening Tool    ASSESSMENT:   Patient with PMH significant for DM2, COPD, HTN, and diabetic neuropathy. Presets this admission with complaints of instability and too weak to get out of the bed. Admitted for acute metabolic encephalopathy most likely multifactorial secondary to combination of dehydration secondary to decreased PO intake and polypharmacy urinary retention.    Pt states his appetite has been poor for three weeks. Reports during this time period he would eat one meal per day that consisted of a meat and vegetable with 1 Glucerna/day Pt denies issues with swallowing or chewing. Wife states pt has poor dentition. RD observed blackish fragile teeth. Discussed the importance of protein intake for preservation of lean body mass. Discussed possible increasing oral nutrition supplements if poor PO intake continues at home.   Pt's states his appetite is increasing this admission. Meal completions charted as 90% for his breakfast this morning. Pt did not eat lunch as he didn't like the taste. Pt amendable to Ensure this stay.   Pt is unsure of his UBW and endorses an unknown amount of weight loss. Records indicate pt weighed 178 lb on 10/01/17 and 154 lb this admission (13.5% wt loss in 3 months, significant for time frame). Nutrition-Focused physical exam completed.   Medications  reviewed.  Labs reviewed: K 3.0 (L)  MG 1.4 (L)   NUTRITION - FOCUSED PHYSICAL EXAM:    Most Recent Value  Orbital Region  Moderate depletion  Upper Arm Region  Moderate depletion  Thoracic and Lumbar Region  Unable to assess  Buccal Region  Moderate depletion  Temple Region  Moderate depletion  Clavicle Bone Region  Severe depletion  Clavicle and Acromion Bone Region  Severe depletion  Scapular Bone Region  Unable to assess  Dorsal Hand  Mild depletion  Patellar Region  Moderate depletion  Anterior Thigh Region  Moderate depletion  Posterior Calf Region  Moderate depletion  Edema (RD Assessment)  None  Hair  Reviewed  Eyes  Reviewed  Mouth  Reviewed  Skin  Reviewed  Nails  Reviewed     Diet Order:   Diet Order            DIET DYS 2 Room service appropriate? Yes; Fluid consistency: Thin  Diet effective now              EDUCATION NEEDS:   Education needs have been addressed  Skin:  Skin Assessment: Skin Integrity Issues: Skin Integrity Issues:: Stage II, Stage III Stage II: right heel Stage III: right heel  Last BM:  01/14/18  Height:   Ht Readings from Last 1 Encounters:  01/15/18 6' (1.829 m)    Weight:   Wt Readings from Last 1 Encounters:  01/15/18 70 kg    Ideal Body Weight:  80.9 kg  BMI:  Body mass index is 20.93 kg/m.  Estimated Nutritional Needs:   Kcal:  2150-2350 kcal  [id number]:  110-125 grams   Fluid:  >/= 2.1 L/day   Vanessa Kickarly Arminda Foglio RD, LDN Clinical Nutrition Pager # - [phone]

## 2018-01-15 NOTE — NC FL2 (Signed)
[place] MEDICAID FL2 LEVEL OF CARE SCREENING TOOL     IDENTIFICATION  Patient Name: Jeremy Gill Birthdate: February 23, 1946 Sex: male Admission Date (Current Location): 01/13/2018  [place]   Facility and Address:  [facility],  [address], [place] [id number]      Provider Number: [id number]  Attending Physician Name and Address:  Edsel PetrinMikhail, Maryann, DO  Relative Name and Phone Number:       Current Level of Care: Hospital Recommended Level of Care: Skilled Nursing Facility Prior Approval Number:    Date Approved/Denied:   PASRR Number: [id number] [id number]  Discharge Plan: SNF    Current Diagnoses: Patient Active Problem List   Diagnosis Date Noted  . Acute urinary retention 01/13/2018  . Acute encephalopathy 01/13/2018  . Obstipation 01/13/2018  . Chronic pain syndrome 11/01/2017  . Pressure injury of skin 10/02/2017  . Anemia   . Depression 09/30/2017  . Acute renal failure superimposed on stage 3 chronic kidney disease (HCC) 09/30/2017  . Sepsis (HCC) 09/30/2017  . Hypokalemia 09/30/2017  . Hyperlipidemia associated with type 2 diabetes mellitus (HCC) 02/05/2017  . AKI (acute kidney injury) (HCC) 12/19/2016  . Acute metabolic encephalopathy 12/19/2016  . DKA (diabetic ketoacidoses) (HCC) 12/18/2016  . Hyperkalemia 12/18/2016  . Anxiety disorder, unspecified 02/08/2016  . Low back pain with radiation 01/23/2016  . Chronic pain disorder 11/30/2015  . Generalized osteoarthritis of multiple sites 11/30/2015  . DYSHIDROSIS 06/08/2008  . VENOUS INSUFFICIENCY 11/25/2007  . 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 TOBACCO ABUSE 07/08/2007  . Diabetic polyneuropathy (HCC) 04/08/2007  . 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 Essential hypertension 10/25/2006  . COPD (chronic obstructive pulmonary disease) (HCC) 10/25/2006    Orientation RESPIRATION BLADDER Height & Weight     Self,  Time, Situation, Place  Normal Continent, Indwelling catheter Weight: 154 lb 5.2 oz (70 kg) Height:  6' (182.9 cm)  BEHAVIORAL SYMPTOMS/MOOD NEUROLOGICAL BOWEL NUTRITION STATUS      Continent Diet(dysphasia II diet, crush medications in puree)  AMBULATORY STATUS COMMUNICATION OF NEEDS Skin   Extensive Assist Verbally PU Stage and Appropriate Care(pressure injury stage II right heel, foam dressing)                       Personal Care Assistance Level of Assistance  Bathing, Feeding, Dressing Bathing Assistance: Maximum assistance Feeding assistance: Independent Dressing Assistance: Limited assistance     Functional Limitations Info  Sight, Hearing, Speech Sight Info: Adequate Hearing Info: Adequate Speech Info: Adequate    SPECIAL CARE FACTORS FREQUENCY  PT (By licensed PT), OT (By licensed OT), Speech therapy     PT Frequency: 5x OT Frequency: 5x     Speech Therapy Frequency: 1x      Contractures Contractures Info: Not present    Additional Factors Info  Code Status, Allergies Code Status Info: full Allergies Info: nka           Current Medications (01/15/2018):  This is the current hospital active medication list Current Facility-Administered Medications  Medication Dose Route Frequency Provider Last Rate Last Dose  . acetaminophen (TYLENOL) tablet 650 mg  650 mg Oral Q6H PRN Therisa Doyneoutova, Anastassia, MD   650 mg at 01/15/18 1347   Or  . acetaminophen (TYLENOL) suppository 650 mg  650 mg Rectal Q6H PRN Doutova, Anastassia, MD      . chlorhexidine (PERIDEX) 0.12 % solution 15 mL  15 mL Mouth Rinse  BID Edsel PetrinMikhail, Maryann, DO   15 mL at 01/15/18 1032  . hydrALAZINE (APRESOLINE) injection 10 mg  10 mg Intravenous Q6H PRN Edsel PetrinMikhail, Maryann, DO   10 mg at 01/15/18 1125  . insulin aspart (novoLOG) injection 0-9 Units  0-9 Units Subcutaneous Q4H Therisa Doyneoutova, Anastassia, MD   3 Units at 01/15/18 1157  . insulin glargine (LANTUS) injection 10 Units  10 Units Subcutaneous QHS  Therisa Doyneoutova, Anastassia, MD   10 Units at 01/14/18 2126  . ipratropium-albuterol (DUONEB) 0.5-2.5 (3) MG/3ML nebulizer solution 3 mL  3 mL Nebulization Q6H PRN Doutova, Anastassia, MD      . MEDLINE mouth rinse  15 mL Mouth Rinse q12n4p Mikhail, Maryann, DO   15 mL at 01/15/18 1158  . metoprolol succinate (TOPROL-XL) 24 hr tablet 12.5 mg  12.5 mg Oral Daily Edsel PetrinMikhail, Maryann, DO   12.5 mg at 01/15/18 0857  . ondansetron (ZOFRAN) tablet 4 mg  4 mg Oral Q6H PRN Therisa Doyneoutova, Anastassia, MD       Or  . ondansetron (ZOFRAN) injection 4 mg  4 mg Intravenous Q6H PRN Doutova, Anastassia, MD      . tamsulosin (FLOMAX) capsule 0.4 mg  0.4 mg Oral QPC breakfast Doutova, Anastassia, MD   0.4 mg at 01/15/18 [id number]     Discharge Medications: Please see discharge summary for a list of discharge medications.  Relevant Imaging Results:  Relevant Lab Results:   Additional Information SS# [id number]  Nelwyn SalisburyMeghan R Clova Morlock, LCSW

## 2018-01-15 NOTE — Progress Notes (Signed)
PROGRESS NOTE    Jeremy Gill  EAV:[id number] DOB: [birth year] DOA: 01/13/2018 PCP: [place], [name], MD   Brief Narrative:  HPI On 01/13/2018 by Dr. Marina Goodell Jeremy Gill is a 72 y.o. male with medical history significant of  DM2, COPD, HTN, diabetic neuropathy   Presented with wildly fluctuating blood glucose. Have been more confused as per family for the past 2 days. For the past 2 days have been unstable, too weak to get out of the bed. He has been leaning to his right. Wife noted left side of the face is droopy for the past 2-3 months. No new medications.   Wife checked his blood sugars yesterday was in  50s and went up to 600  With orange juice and some Candy called EMS insulin was administered in today in the morning again was hypoglycemic used to be on Lantus 20 units and metformin also has been dosing himself with with Victoza as needed since it is expensive not taking on a regular basis.  His blood sugar has been running in 170s to 200 and he was seen in July by his primary care provider who increased Lantus to 25 units and instructed to continue to titrate up by 5 units a week. Last night   wife gave 25 units of Lantus. She has been given mostly 20 units but occasionally has given him 25 units when BG has been running high.   Wife states for the past 2 days patient has not been able to urinate he has been trying to stand up to do so but could not empty his bladder.  Interim history Admitted for acute metabolic encephalopathy which is improved.  Also found to have acute kidney injury with acute urinary retention, possibly secondary to constipation.  Will give Relistor today continue to monitor. Assessment & Plan   Acute metabolic encephalopathy -Likely multifactorial including combination of dehydration with decreased oral intake -Mental status has improved, unknown baseline -Patient is aware of himself, place, circumstance -Continue to treat underlying  etiologies  Acute kidney injury in the setting of acute urinary retention -Foley catheter has been placed -Creatinine on admission 1.50, has improved mildly to 1.21 (baseline appears to be 0.9-1.1) -Continue IV fluids and monitor BMP -?  Secondary to obstipation -UA showed rare bacteria, 6-10 WBC, negative leukocytes or nitrites. -Urine culture pending -CT renal stone study showed marked urinary bladder distention with 1.91 L.  No obstructing calculus prostamegaly identified.  Moderate hydroureteronephrosis.  Perinephric fat stranding seen which is nonspecific.  Increased fecal retention throughout the colon consistent with constipation.  Fecal impaction noted in the fecal vault with mild moderate mural thickening of the rectum, possibility of stercoral proctitis. -Placed on Flomax -after constipation has improved or resolved, will attempt voiding trial  Obstipation -Possibly secondary to narcotic use -Patient was given enema and lactulose with minimal bowel movement -CT as above -Abd Xray still shows stool burden -will give dose of Relistor  Chronic pain disorder -Avoiding narcotics given severe obstipation and altered mental status  COPD  -Stable, continue DuoNeb treatments as needed  Diabetes mellitus, type II with neuropathy -Have placed on D5 half-normal saline given hypoglycemic episodes -Continue Lantus, insulin sliding scale and CBG monitoring  Essential hypertension -Blood pressure uncontrolled, restarted patient's metoprolol -Will also place on IV hydralazine as needed  Chronic normocytic anemia -Hemoglobin down to 9.8, feel drop is secondary to delusional component as patient has been receiving IV fluids -Continue to monitor CBC -Continue IV fluids  Mild hyponatremia -Resolved, sodium currently 141  Hypokalemia -Will replace and continue to monitor BMP  Physical deconditioning -PT consulted and pending  DVT Prophylaxis SCDs  Code Status: Full  Family  Communication: None at bedside  Disposition Plan: Admitted, continue to monitor.  Suspect discharged home within 24 to 48 hours.  Consultants None  Procedures  None  Antibiotics   Anti-infectives (From admission, onward)   None      Subjective:   Jeremy Gill seen and examined today.  Better this morning.  Denies current chest pain, shortness breath, abdominal pain, nausea or vomiting, diarrhea.  Had a small bowel movement yesterday.  Denies current dizziness or headache.  Objective:   Vitals:   01/15/18 1000 01/15/18 1030 01/15/18 1125 01/15/18 1200  BP: (!) 185/105 (!) 179/89 (!) 195/92 (!) 116/55  Pulse: 91 89  98  Resp: 14 14  17   Temp:    98.4 F (36.9 C)  TempSrc:    Oral  SpO2: 100% 100%  100%  Weight:      Height:        Intake/Output Summary (Last 24 hours) at 01/15/2018 1221 Last data filed at 01/15/2018 1211 Gross per 24 hour  Intake 1386.48 ml  Output 2650 ml  Net -1263.52 ml   Filed Weights   01/14/18 0147  Weight: 70.1 kg   Exam  General: Well developed, well nourished, NAD, appears stated age  HEENT: NCAT, mucous membranes moist. Poor dentition  Neck: Supple  Cardiovascular: S1 S2 auscultated, RRR, no murmurs  Respiratory: Clear to auscultation bilaterally with equal chest rise  Abdomen: Soft, nontender, nondistended, + bowel sounds  Extremities: warm dry without cyanosis clubbing. LE edema (trace), bandage on R heel-healing ucler  Neuro: AAOx3, nonfocal  Psych: Normal affect and demeanor, pleasant   Data Reviewed: I have personally reviewed following labs and imaging studies  CBC: Recent Labs  Lab 01/13/18 1747 01/13/18 1757 01/14/18 0328 01/15/18 0321  WBC 13.6*  --  12.7* 10.8*  NEUTROABS 11.8*  --   --   --   HGB 11.7* 12.2* 11.0* 9.8*  HCT 35.3* 36.0* 32.8* 28.9*  MCV 90.3  --  90.1 88.7  PLT 311  --  302 271   Basic Metabolic Panel: Recent Labs  Lab 01/13/18 1747 01/13/18 1757 01/14/18 0328 01/15/18 0321  NA  141 140 146* 141  K 4.4 4.3 4.5 3.0*  CL 102 102 108 106  CO2 26  --  30 27  GLUCOSE 119* 117* 74 172*  BUN 32* 33* 27* 18  CREATININE 1.44* 1.50* 1.21 0.83  CALCIUM 9.2  --  9.2 8.6*  MG  --   --  1.7  --   PHOS  --   --  4.7*  --    GFR: Estimated Creatinine Clearance: 79.8 mL/min (by C-G formula based on SCr of 0.83 mg/dL). Liver Function Tests: Recent Labs  Lab 01/13/18 1747 01/14/18 0328  AST 25 21  ALT 16 13  ALKPHOS 39 35*  BILITOT 0.8 0.8  PROT 7.6 6.7  ALBUMIN 3.7 3.2*   No results for input(s): LIPASE, AMYLASE in the last 168 hours. No results for input(s): AMMONIA in the last 168 hours. Coagulation Profile: No results for input(s): INR, PROTIME in the last 168 hours. Cardiac Enzymes: No results for input(s): CKTOTAL, CKMB, CKMBINDEX, TROPONINI in the last 168 hours. BNP (last 3 results) No results for input(s): PROBNP in the last 8760 hours. HbA1C: Recent Labs    01/14/18 0328  HGBA1C 9.1*   CBG: Recent Labs  Lab 01/14/18 1940 01/14/18 2321 01/15/18 0341 01/15/18 0729 01/15/18 1152  GLUCAP 267* 219* 169* 179* 245*   Lipid Profile: No results for input(s): CHOL, HDL, LDLCALC, TRIG, CHOLHDL, LDLDIRECT in the last 72 hours. Thyroid Function Tests: Recent Labs    01/14/18 0328  TSH 0.760   Anemia Panel: No results for input(s): VITAMINB12, FOLATE, FERRITIN, TIBC, IRON, RETICCTPCT in the last 72 hours. Urine analysis:    Component Value Date/Time   COLORURINE YELLOW 01/13/2018 1923   APPEARANCEUR CLOUDY (A) 01/13/2018 1923   LABSPEC 1.009 01/13/2018 1923   PHURINE 5.0 01/13/2018 1923   GLUCOSEU 150 (A) 01/13/2018 1923   GLUCOSEU NEGATIVE 09/30/2008 0851   HGBUR LARGE (A) 01/13/2018 1923   HGBUR trace-lysed 03/29/2010 0940   BILIRUBINUR NEGATIVE 01/13/2018 1923   BILIRUBINUR neg 01/13/2014 1141   KETONESUR NEGATIVE 01/13/2018 1923   PROTEINUR NEGATIVE 01/13/2018 1923   UROBILINOGEN 1.0 01/13/2014 1141   UROBILINOGEN 0.2 03/29/2010 0940    NITRITE NEGATIVE 01/13/2018 1923   LEUKOCYTESUR NEGATIVE 01/13/2018 1923   Sepsis Labs: @LABRCNTIP (procalcitonin:4,lacticidven:4)  ) Recent Results (from the past 240 hour(s))  Urine culture     Status: None   Collection Time: 01/13/18  7:23 PM  Result Value Ref Range Status   Specimen Description   Final    URINE, RANDOM Performed at [facility], [address] [place], [place] [address]    Special Requests   Final    NONE Performed at [facility], [address] [place], [place] [address]    Culture   Final    NO GROWTH Performed at [facility], [address] [place], [place] [address]    Report Status 01/15/2018 FINAL  Final  MRSA PCR Screening     Status: None   Collection Time: 01/13/18 11:43 PM  Result Value Ref Range Status   MRSA by PCR NEGATIVE NEGATIVE Final    Comment:        The GeneXpert MRSA Assay (FDA approved for NASAL specimens only), is one component of a comprehensive MRSA colonization surveillance program. It is not intended to diagnose MRSA infection nor to guide or monitor treatment for MRSA infections. Performed at [facility], [address] [place], [place] [address]       Radiology Studies: Dg Abd 1 View  Result Date: 01/14/2018 CLINICAL DATA:  Abdominal distension without tenderness. Reportedly normal bowel movements. Recent hyper and hypo glycemia with some mental status change. EXAM: ABDOMEN - 1 VIEW COMPARISON:  Abdominal CT scan of January 13, 2018. FINDINGS: The colonic stool burden is increased diffusely. The stool burden within the rectum is also increased. There are no abnormal soft tissue calcifications. The bony structures are unremarkable. IMPRESSION: Increased colonic stool burden compatible with constipation in the appropriate clinical setting. Increased rectal stool burden may reflect an impaction in the appropriate setting. No evidence of ileus,  obstruction, or perforation. Electronically Signed   By: David  [place] M.D.   On: 01/14/2018 08:59   Ct Head Wo Contrast  Result Date: 01/13/2018 CLINICAL DATA:  Weakness.  Mental status changes.  Diabetes. EXAM: CT HEAD WITHOUT CONTRAST TECHNIQUE: Contiguous axial images were obtained from the base of the skull through the vertex without intravenous contrast. COMPARISON:  10/15/2017 CT.  10/01/2017 MRI. FINDINGS: Brain: Generalized atrophy. Advanced chronic small-vessel ischemic changes of the cerebral hemispheric white matter. No sign of acute infarction, mass lesion, hemorrhage, hydrocephalus or  extra-axial collection. Vascular: There is atherosclerotic calcification of the major vessels at the base of the brain. Skull: Negative Sinuses/Orbits: Clear/normal Other: None IMPRESSION: Atrophy and chronic small-vessel ischemic changes. No acute finding by CT. Electronically Signed   By: Paulina Fusi M.D.   On: 01/13/2018 18:55   Mr Brain Wo Contrast  Result Date: 01/14/2018 CLINICAL DATA:  Altered mental status. Possible stroke. Diminished responsiveness. EXAM: MRI HEAD WITHOUT CONTRAST TECHNIQUE: Multiplanar, multiecho pulse sequences of the brain and surrounding structures were obtained without intravenous contrast. COMPARISON:  CT 2019.  MRI 10/01/2017. FINDINGS: Brain: Diffusion imaging does not show any acute or subacute infarction. There are advanced chronic small-vessel ischemic changes throughout the pons in the cerebral hemispheric white matter. There are old lacunar infarctions affecting the thalami, basal ganglia and deep white matter. No large vessel territory infarction. No mass lesion, hemorrhage, hydrocephalus or extra-axial collection. Vascular: Major vessels at the base of the brain show flow. Skull and upper cervical spine: Negative Sinuses/Orbits: Clear/normal Other: None IMPRESSION: No change since the previous exams. No acute or subacute finding. 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 Electronically Signed   By: Paulina Fusi M.D.   On: 01/14/2018 07:31   Dg Chest Port 1 View  Result Date: 01/13/2018 CLINICAL DATA:  Low blood sugar last evening with dyspnea. EXAM: PORTABLE CHEST 1 VIEW COMPARISON:  10/15/2017 FINDINGS: Heart size is normal. There is mild aortic atherosclerosis without aneurysm. Mild pulmonary hyperinflation is redemonstrated without acute pulmonary consolidation. Right axillary arterial calcifications are noted. No acute nor suspicious osseous lesions. IMPRESSION: Hyperinflated lungs without acute pulmonary consolidation or CHF. Aortic atherosclerosis. Electronically Signed   By: Tollie Eth M.D.   On: 01/13/2018 17:37   Ct Renal Stone Study  Result Date: 01/13/2018 CLINICAL DATA:  Hematuria and leukocytosis. EXAM: CT ABDOMEN AND PELVIS WITHOUT CONTRAST TECHNIQUE: Multidetector CT imaging of the abdomen and pelvis was performed following the standard protocol without IV contrast. COMPARISON:  None. FINDINGS: Lower chest: Normal size heart without pericardial effusion. Mild peribronchial thickening to the lower lobes. Minimal bibasilar atelectasis. Hepatobiliary: Unremarkable unenhanced appearance of the liver. No biliary dilatation. Physiologic distention of the gallbladder. No cholelithiasis. Subtle dependent hyperdensity within the gallbladder may represent biliary sludge. Pancreas: Atrophic without mass or ductal dilatation. Spleen: Normal Adrenals/Urinary Tract: Marked distention of the urinary bladder to above the level of the umbilicus measuring 19.5 x 12.9 x 14.5 cm (volume = 1910 cm^3). No focal mural thickening or calculi. Dilatation of the renal collecting systems bilaterally may be secondary to the full bladder. No obstructing calculus or definite intramural mass is apparent. Nonspecific mild perinephric fat stranding is seen bilaterally. Urinary tract infection it is  not entirely excluded. Normal bilateral adrenal glands. Stomach/Bowel: There is a significant amount of fecal retention throughout the colon fecal impaction in the rectum. Stercoral proctitis is also raised given mild moderate transmural thickening of the rectum. No small bowel dilatation. The stomach is physiologically distended with ingested liquids. A hyperdensity within the dependent aspect of the stomach may represent ingested pill. Vascular/Lymphatic: Moderate aortoiliac and branch vessel atherosclerosis. Lymphadenopathy. Reproductive: Prostate is normal size. Seminal vesicles a partially calcified in appearance. Other: No free air nor free fluid. Musculoskeletal: Degenerative disc disease L5-S1. No aggressive osseous lesions. IMPRESSION: Marked urinary bladder distention with overall volume approximately 1.91 L. No obstructing calculus prostatomegaly is identified. Question neurogenic bladder. This is resulting in moderate hydroureteronephrosis. Perinephric fat stranding is  seen which is nonspecific but can be seen in urinary tract infections and therefore clinical correlation is recommended. Subtle hyperdensity along the dependent aspect of the gallbladder may represent biliary sludge. No calculi are identified. No secondary signs of acute cholecystitis. Increased fecal retention throughout the colon consistent constipation. Fecal impaction is also noted in the rectal vault with mild moderate transmural thickening of the rectum raising the possibility a stercoral proctitis Electronically Signed   By: Tollie Ethavid  Kwon M.D.   On: 01/13/2018 22:26     Scheduled Meds: . chlorhexidine  15 mL Mouth Rinse BID  . insulin aspart  0-9 Units Subcutaneous Q4H  . insulin glargine  10 Units Subcutaneous QHS  . mouth rinse  15 mL Mouth Rinse q12n4p  . metoprolol succinate  12.5 mg Oral Daily  . tamsulosin  0.4 mg Oral QPC breakfast   Continuous Infusions: . dextrose 5 % and 0.45% NaCl 75 mL/hr at 01/14/18 0754      LOS: 2 days   Time Spent in minutes   45 minutes  Averill Pons D.O. on 01/15/2018 at 12:21 PM  Between 7am to 7pm - Please see pager noted on [email or web address]  After 7pm go to [email or web address]  And look for the night coverage person covering for me after hours  [facility] Office  [phone]

## 2018-01-15 NOTE — Progress Notes (Addendum)
Physical Therapy Treatment Patient Details Name: Jeremy Gill MRN: [id number] DOB: 07-07-1945 Today's Date: 01/15/2018    History of Present Illness pt was admitted for acute metabolic encephalopathy.  He was admitted with similar dx in May '19.  PMH:  DM, HTN, COPD, chronic back pain and neuropathy.  Pressure R heel    PT Comments    The patient is more participatory, continues to be slow to initiate. Assisted to ambulate to BR. No BM but smears to  Wipe up. Wife reports patient has been constipated. RN aware. Continue PT.  BP pre/supine 167/84, post activity 137/80. No c/o dizziness.   Follow Up Recommendations  SNF;Supervision/Assistance - 24 hour     Equipment Recommendations       Recommendations for Other Services       Precautions / Restrictions Precautions Precautions: Fall Precaution Comments: BP may be high    Mobility  Bed Mobility Overal bed mobility: Needs Assistance Bed Mobility: Supine to Sit   Sidelying to sit: Min assist;HOB elevated       General bed mobility comments: extra time, light assistance with 1 UE to scoot to bed edge  Transfers Overall transfer level: Needs assistance Equipment used: Rolling walker (2 wheeled) Transfers: Sit to/from Stand Sit to Stand: Min assist;From elevated surface         General transfer comment: multimodal cues to reach back to  [facility] arms, much extra time. Per wife due to "Bad back".  Ambulation/Gait Ambulation/Gait assistance: Min assist Gait Distance (Feet): 25 Feet(x 2) Assistive device: Rolling walker (2 wheeled) Gait Pattern/deviations: Step-through pattern;Step-to pattern;Shuffle     General Gait Details: slow to initiate, min assist with turns and backing up. Gait is fairly steady with RW.   Stairs             Wheelchair Mobility    Modified Rankin (Stroke Patients Only)       Balance                                            Cognition Arousal/Alertness:  Awake/alert Behavior During Therapy: WFL for tasks assessed/performed;Flat affect                                   General Comments: wife present, Patient is able to [facility]  although slow, still not oriented       Exercises      General Comments        Pertinent Vitals/Pain Faces Pain Scale: Hurts whole lot Pain Location: impaction and back Pain Intervention(s): Monitored during session    Home Living                      Prior Function            PT Goals (current goals can now be found in the care plan section) Progress towards PT goals: Progressing toward goals    Frequency    Min 2X/week      PT Plan Current plan remains appropriate    Co-evaluation              AM-PAC PT "6 Clicks" Daily Activity  Outcome Measure  Difficulty turning over in bed (including adjusting bedclothes, sheets and blankets)?: A Little Difficulty moving from lying on back to sitting on  the side of the bed? : A Lot Difficulty sitting down on and standing up from a chair with arms (e.g., wheelchair, bedside commode, etc,.)?: A Lot Help needed moving to and from a bed to chair (including a wheelchair)?: A Lot Help needed walking in hospital room?: A Lot Help needed climbing 3-5 steps with a railing? : Total 6 Click Score: 12    End of Session Equipment Utilized During Treatment: Gait belt Activity Tolerance: Patient tolerated treatment well Patient left: in chair;with chair alarm set;with call bell/phone within reach;with family/visitor present Nurse Communication: Mobility status PT Visit Diagnosis: Unsteadiness on feet (R26.81);Pain     Time: [id number] PT Time Calculation (min) (ACUTE ONLY): 52 min  Charges:  $Gait Training: 8-22 mins $Therapeutic Activity: 8-22 mins $Self Care/Home Management: 8-22                     [facility] Lakina Mcintire PT [phone]    Rada HayHill, [name] [name] 01/15/2018, 4:33 PM

## 2018-01-15 NOTE — Clinical Social Work Note (Signed)
Clinical Social Work Assessment  Patient Details  Name: Jeremy Gill MRN: [id number] Date of Birth: 05/20/1946  Date of referral:  01/15/18               Reason for consult:  Facility Placement                Permission sought to share information with:  Family Supports Permission granted to share information::  Yes, Verbal Permission Granted  Name::     wife Patricia  Agency::     Relationship::     Contact Information:     Housing/Transportation Living arrangements for the past 2 months:  Single Family Home Source of Information:  Patient, Adult Children Patient Interpreter Needed:  None Criminal Activity/Legal Involvement Pertinent to Current Situation/Hospitalization:  No - Comment as needed Significant Relationships:  Parents, Spouse Lives with:  Spouse Do you feel safe going back to the place where you live?  Yes Need for family participation in patient care:  Yes (Comment)(wife involved)  Care giving concerns:  Pt admitted from home where he resides with his wife. States prior to admission he was ambulating fairly well independently. Wife states he became increasingly confused during couple of days prior to admission.  Admitted for encephalopathy, AKI (urinary retention)   Social Worker assessment / plan:  CSW consulted to assist with SNF placement. Met with pt and wife at bedside. Discussed SNF rehab recommendation and they were in agreement to pursue this. Discussed referral and insurance authorization process. Obtained PASRR and completed FL2 for referrals. Provided wife list of area SNFs in order to review for preferences.   Plan: SNF at DC, awaiting bed offers, insurance authorization  Employment status:  Retired Insurance information:  Managed Medicare(healthteam advantage) PT Recommendations:  Skilled Nursing Facility, 24 Hour Supervision Information / Referral to community resources:  Skilled Nursing Facility  Patient/Family's Response to care:  Engaged,  appreciative  Patient/Family's Understanding of and Emotional Response to Diagnosis, Current Treatment, and Prognosis:  Did not discuss current treatment specifically but pt and wife did describe admission conditions thoroughly.  Emotional Assessment Appearance:  Appears stated age Attitude/Demeanor/Rapport:  (drowsy) Affect (typically observed):  Accepting, Calm Orientation:  Oriented to Self, Oriented to Place, Oriented to Situation Alcohol / Substance use:  Not Applicable Psych involvement (Current and /or in the community):  No (Comment)  Discharge Needs  Concerns to be addressed:  Discharge Planning Concerns Readmission within the last 30 days:  No Current discharge risk:  Dependent with Mobility Barriers to Discharge:  Continued Medical Work up, Insurance Authorization    R , LCSW 01/15/2018, 3:53 PM [phone] 

## 2018-01-15 NOTE — Care Management Note (Signed)
Case Management Note  Patient Details  Name: Jeremy Gill MRN: [id number] Date of Birth: 06/28/1945  Subjective/Objective:                  Presented withwildly fluctuating blood glucose. Have been more confusedas per family for the past 2 days.For the past 2 days have been unstable, too weak to get out of the bed. He has been leaning to his right. Wife noted left side of the face is droopy for the past 2-3 months. No new medications.Wife checked his blood sugars yesterday was in 50s and went up to 600 With orange juice and some Candycalled EMS insulin was administered in today in the morning again was hypoglycemic used to be on Lantus 20 units and metformin also has been dosing himself with with Victoza as needed since it is expensive not taking on a regular basis. His blood sugar has been running in 170s to 200 and he was seen in July by his primary care provider who increased Lantus to 25 units and instructed to continue to titrate up by 5 units a week. Last night wife gave 25 units of Lantus. She has been given mostly 20 units but occasionally has given him 25 units when BG has been running high.   Wife states for the past 2 days patient has not been able to urinate he has been trying to stand up to do so but could not empty his bladder.  Action/Plan: Following for cm needs-none present at this time. Progression/improving  Expected Discharge Date:  (unknown)               Expected Discharge Plan:     In-House Referral:     Discharge planning Services     Post Acute Care Choice:    Choice offered to:     DME Arranged:    DME Agency:     HH Arranged:    HH Agency:     Status of Service:     If discussed at [employer] Meetings, dates discussed:    Additional Comments:  Golda AcreDavis, Rhonda Lynn, RN 01/15/2018, 9:50 AM

## 2018-01-16 ENCOUNTER — Inpatient Hospital Stay (HOSPITAL_COMMUNITY): Payer: PPO

## 2018-01-16 ENCOUNTER — Encounter: Payer: PPO | Admitting: Physical Medicine & Rehabilitation

## 2018-01-16 DIAGNOSIS — E43 Unspecified severe protein-calorie malnutrition: Secondary | ICD-10-CM

## 2018-01-16 LAB — GLUCOSE, CAPILLARY
GLUCOSE-CAPILLARY: 295 mg/dL — AB (ref 70–99)
GLUCOSE-CAPILLARY: 258 mg/dL — AB (ref 70–99)
GLUCOSE-CAPILLARY: 267 mg/dL — AB (ref 70–99)
GLUCOSE-CAPILLARY: 277 mg/dL — AB (ref 70–99)
Glucose-Capillary: 315 mg/dL — ABNORMAL HIGH (ref 70–99)
Glucose-Capillary: 201 mg/dL — ABNORMAL HIGH (ref 70–99)
Glucose-Capillary: 175 mg/dL — ABNORMAL HIGH (ref 70–99)

## 2018-01-16 LAB — BASIC METABOLIC PANEL
ANION GAP: 9 (ref 5–15)
BUN: 16 mg/dL (ref 8–23)
CO2: 28 mmol/L (ref 22–32)
Calcium: 8.9 mg/dL (ref 8.9–10.3)
Chloride: 102 mmol/L (ref 98–111)
Creatinine, Ser: 0.84 mg/dL (ref 0.61–1.24)
GFR calc Af Amer: >60 mL/min (ref >60)
GLUCOSE: 207 mg/dL — AB (ref 70–99)
POTASSIUM: 3 mmol/L — AB (ref 3.5–5.1)
Sodium: 139 mmol/L (ref 135–145)

## 2018-01-16 LAB — CBC
HEMATOCRIT: 28.8 % — AB (ref 39.0–52.0)
Hemoglobin: 10 g/dL — ABNORMAL LOW (ref 13.0–17.0)
MCH: 30.2 pg (ref 26.0–34.0)
MCHC: 34.7 g/dL (ref 30.0–36.0)
MCV: 87 fL (ref 78.0–100.0)
PLATELETS: 259 10*3/uL (ref 150–400)
RBC: 3.31 MIL/uL — AB (ref 4.22–5.81)
RDW: 13.8 % (ref 11.5–15.5)
WBC: 10.7 10*3/uL — AB (ref 4.0–10.5)

## 2018-01-16 MED ORDER — ENOXAPARIN SODIUM 40 MG/0.4ML ~~LOC~~ SOLN
40.0000 mg | SUBCUTANEOUS | Status: DC
  Administered 2018-01-16 – 2018-01-17 (×2): 40 mg via SUBCUTANEOUS
  Filled 2018-01-16 (×2): qty 0.4

## 2018-01-16 MED ORDER — METOCLOPRAMIDE HCL 5 MG/ML IJ SOLN
5.0000 mg | Freq: Three times a day (TID) | INTRAMUSCULAR | Status: DC
  Administered 2018-01-16 – 2018-01-17 (×4): 5 mg via INTRAVENOUS
  Filled 2018-01-16 (×4): qty 2

## 2018-01-16 MED ORDER — MINERAL OIL RE ENEM
1.0000 | ENEMA | Freq: Once | RECTAL | Status: AC
  Administered 2018-01-16: 1 via RECTAL
  Filled 2018-01-16: qty 1

## 2018-01-16 MED ORDER — MAGNESIUM SULFATE 4 GM/100ML IV SOLN
4.0000 g | Freq: Once | INTRAVENOUS | Status: AC
  Administered 2018-01-16: 4 g via INTRAVENOUS
  Filled 2018-01-16: qty 100

## 2018-01-16 MED ORDER — INSULIN ASPART 100 UNIT/ML ~~LOC~~ SOLN
0.0000 [IU] | Freq: Three times a day (TID) | SUBCUTANEOUS | Status: DC
  Administered 2018-01-16 (×2): 5 [IU] via SUBCUTANEOUS
  Administered 2018-01-17: 3 [IU] via SUBCUTANEOUS

## 2018-01-16 MED ORDER — POLYETHYLENE GLYCOL 3350 17 G PO PACK
17.0000 g | PACK | Freq: Every day | ORAL | Status: DC
  Administered 2018-01-16 – 2018-01-17 (×2): 17 g via ORAL
  Filled 2018-01-16 (×2): qty 1

## 2018-01-16 MED ORDER — INSULIN GLARGINE 100 UNIT/ML ~~LOC~~ SOLN
15.0000 [IU] | Freq: Every day | SUBCUTANEOUS | Status: DC
  Administered 2018-01-16: 15 [IU] via SUBCUTANEOUS
  Filled 2018-01-16 (×2): qty 0.15

## 2018-01-16 MED ORDER — POTASSIUM CHLORIDE CRYS ER 20 MEQ PO TBCR
40.0000 meq | EXTENDED_RELEASE_TABLET | Freq: Two times a day (BID) | ORAL | Status: AC
  Administered 2018-01-16 (×2): 40 meq via ORAL
  Filled 2018-01-16 (×2): qty 2

## 2018-01-16 NOTE — Progress Notes (Signed)
OT Cancellation Note  Patient Details Name: Lazarus SalinesJames E Radziewicz MRN: [id number] DOB: [birth year]   Cancelled Treatment:    Reason Eval/Treat Not Completed: Patient declined, no reason specified  Riccardo Holeman 01/16/2018, 12:05 PM  Marica OtterMaryellen Aniello Christopoulos, OTR/L [phone] 01/16/2018

## 2018-01-16 NOTE — Progress Notes (Signed)
Pt/wife selected [facility] from bed offers made. Insurance authorization initiated.  Ilean SkillMeghan Jameya Pontiff, MSW, LCSW Clinical Social Work 01/16/2018 [phone]

## 2018-01-16 NOTE — Progress Notes (Signed)
PROGRESS NOTE    Jeremy Gill  ZOX:[id number] DOB: 27-Sep-1945 DOA: 01/13/2018 PCP: [place], [name], MD   Brief Narrative:  HPI On 01/13/2018 by Dr. Marina Goodell Jeremy Gill is a 72 y.o. male with medical history significant of  DM2, COPD, HTN, diabetic neuropathy   Presented with wildly fluctuating blood glucose. Have been more confused as per family for the past 2 days. For the past 2 days have been unstable, too weak to get out of the bed. He has been leaning to his right. Wife noted left side of the face is droopy for the past 2-3 months. No new medications.   Wife checked his blood sugars yesterday was in  50s and went up to 600  With orange juice and some Candy called EMS insulin was administered in today in the morning again was hypoglycemic used to be on Lantus 20 units and metformin also has been dosing himself with with Victoza as needed since it is expensive not taking on a regular basis.  His blood sugar has been running in 170s to 200 and he was seen in July by his primary care provider who increased Lantus to 25 units and instructed to continue to titrate up by 5 units a week. Last night   wife gave 25 units of Lantus. She has been given mostly 20 units but occasionally has given him 25 units when BG has been running high.   Wife states for the past 2 days patient has not been able to urinate he has been trying to stand up to do so but could not empty his bladder.  Interim history Admitted for acute metabolic encephalopathy which is improved.  Also found to have acute kidney injury with acute urinary retention, possibly secondary to constipation.  Despite Relistor, continues to have constipation.   Assessment & Plan   Acute metabolic encephalopathy -Likely multifactorial including combination of dehydration with decreased oral intake -Mental status has improved. Per wife at beside, appears to be back to baseline. -Continue to treat underlying etiologies  Acute kidney  injury in the setting of acute urinary retention -Foley catheter has been placed -Creatinine on admission 1.50, has improved mildly to 0.84 (baseline appears to be 0.9-1.1) -Continue IV fluids and monitor BMP -?  Secondary to obstipation -UA showed rare bacteria, 6-10 WBC, negative leukocytes or nitrites. -Urine culture no growth -CT renal stone study showed marked urinary bladder distention with 1.91 L.  No obstructing calculus prostamegaly identified.  Moderate hydroureteronephrosis.  Perinephric fat stranding seen which is nonspecific.  Increased fecal retention throughout the colon consistent with constipation.  Fecal impaction noted in the fecal vault with mild moderate mural thickening of the rectum, possibility of stercoral proctitis. -Placed on Flomax -after constipation has improved or resolved, will attempt voiding trial  Obstipation -Possibly secondary to narcotic use -Despite enema, lactulose, Relistor, patient continues to have moderate stool burden. -CT as above -Abd xray reviewed, continues to show moderate stool burden -Have ordered mineral oil enema, manual disimpaction, scheduled daily miralax ?given uncontrolled diabetes, will start IV reglan, ?gastroparesis -continue to monitor  Chronic pain disorder -Avoiding narcotics given severe obstipation and altered mental status -discussed with patient's wife, denies narcotic use, only tramadol  COPD  -Stable, continue DuoNeb treatments as needed  Diabetes mellitus, type II with neuropathy -Have placed on D5 half-normal saline given hypoglycemic episodes -Continue Lantus, insulin sliding scale and CBG monitoring  Essential hypertension -Blood pressure uncontrolled, restarted patient's metoprolol -Will also place on IV hydralazine  as needed  Chronic normocytic anemia -Hemoglobin down to 10 (suspect drop is dilutional given IVF- which have been discontinued) -Continue to monitor CBC  Mild hyponatremia -Resolved,  sodium currently 139  Hypokalemia -Continue to replace and monitor BMP  Hypomagnesemia -will replace and continue to monitor   Physical deconditioning -PT consulted and recommended SNF -patient states he wants to go home  DVT Prophylaxis SCDs  Code Status: Full  Family Communication: Wife and mother at bedside  Disposition Plan: Admitted, continue to monitor.  Suspect discharged home within 24 to 48 hours when constipation has resolved.  Consultants None  Procedures  None  Antibiotics   Anti-infectives (From admission, onward)   None      Subjective:   Jeremy Gill seen and examined today.  Patient feeling better this morning. Denies bowel movement, but complains of abdominal cramping and gas passage. Denies rest pain, shortness of breath, nausea or vomiting, diarrhea, dizziness or headache.  Objective:   Vitals:   01/15/18 1200 01/15/18 1403 01/15/18 2333 01/16/18 0441  BP: (!) 116/55 (!) 159/88 114/64 135/87  Pulse: 98 (!) 103 (!) 103 91  Resp: 17 16 18 20   Temp: 98.4 F (36.9 C) 98.5 F (36.9 C) 98.7 F (37.1 C) 98.4 F (36.9 C)  TempSrc: Oral Oral Oral Oral  SpO2: 100% 99% 98% 98%  Weight:  70 kg    Height:  6' (1.829 m)      Intake/Output Summary (Last 24 hours) at 01/16/2018 1327 Last data filed at 01/16/2018 1312 Gross per 24 hour  Intake -  Output 2375 ml  Net -2375 ml   Filed Weights   01/14/18 0147 01/15/18 1403  Weight: 70.1 kg 70 kg   Exam  General: Well developed, well nourished, NAD, appears stated age  HEENT: NCAT, mucous membranes moist.  Poor dentition  Neck: Supple  Cardiovascular: S1 S2 auscultated, no murmurs, RRR  Respiratory: Clear to auscultation bilaterally with equal chest rise  Abdomen: Soft, nontender, nondistended, + bowel sounds  Extremities: warm dry without cyanosis clubbing.  Trace LE edema, bandage on right heel  Neuro: AAOx3, nonfocal  Psych: Pleasant, appropriate mood and affect  Data Reviewed: I  have personally reviewed following labs and imaging studies  CBC: Recent Labs  Lab 01/13/18 1747 01/13/18 1757 01/14/18 0328 01/15/18 0321 01/16/18 0409  WBC 13.6*  --  12.7* 10.8* 10.7*  NEUTROABS 11.8*  --   --   --   --   HGB 11.7* 12.2* 11.0* 9.8* 10.0*  HCT 35.3* 36.0* 32.8* 28.9* 28.8*  MCV 90.3  --  90.1 88.7 87.0  PLT 311  --  302 271 259   Basic Metabolic Panel: Recent Labs  Lab 01/13/18 1747 01/13/18 1757 01/14/18 0328 01/15/18 0321 01/16/18 0409  NA 141 140 146* 141 139  K 4.4 4.3 4.5 3.0* 3.0*  CL 102 102 108 106 102  CO2 26  --  30 27 28   GLUCOSE 119* 117* 74 172* 207*  BUN 32* 33* 27* 18 16  CREATININE 1.44* 1.50* 1.21 0.83 0.84  CALCIUM 9.2  --  9.2 8.6* 8.9  MG  --   --  1.7 1.4*  --   PHOS  --   --  4.7*  --   --    GFR: Estimated Creatinine Clearance: 78.7 mL/min (by C-G formula based on SCr of 0.84 mg/dL). Liver Function Tests: Recent Labs  Lab 01/13/18 1747 01/14/18 0328  AST 25 21  ALT 16 13  ALKPHOS 39  35*  BILITOT 0.8 0.8  PROT 7.6 6.7  ALBUMIN 3.7 3.2*   No results for input(s): LIPASE, AMYLASE in the last 168 hours. No results for input(s): AMMONIA in the last 168 hours. Coagulation Profile: No results for input(s): INR, PROTIME in the last 168 hours. Cardiac Enzymes: No results for input(s): CKTOTAL, CKMB, CKMBINDEX, TROPONINI in the last 168 hours. BNP (last 3 results) No results for input(s): PROBNP in the last 8760 hours. HbA1C: Recent Labs    01/14/18 0328  HGBA1C 9.1*   CBG: Recent Labs  Lab 01/15/18 2036 01/16/18 0111 01/16/18 0511 01/16/18 0809 01/16/18 1219  GLUCAP 276* 315* 201* 175* 258*   Lipid Profile: No results for input(s): CHOL, HDL, LDLCALC, TRIG, CHOLHDL, LDLDIRECT in the last 72 hours. Thyroid Function Tests: Recent Labs    01/14/18 0328  TSH 0.760   Anemia Panel: No results for input(s): VITAMINB12, FOLATE, FERRITIN, TIBC, IRON, RETICCTPCT in the last 72 hours. Urine analysis:      Component Value Date/Time   COLORURINE YELLOW 01/13/2018 1923   APPEARANCEUR CLOUDY (A) 01/13/2018 1923   LABSPEC 1.009 01/13/2018 1923   PHURINE 5.0 01/13/2018 1923   GLUCOSEU 150 (A) 01/13/2018 1923   GLUCOSEU NEGATIVE 09/30/2008 0851   HGBUR LARGE (A) 01/13/2018 1923   HGBUR trace-lysed 03/29/2010 0940   BILIRUBINUR NEGATIVE 01/13/2018 1923   BILIRUBINUR neg 01/13/2014 1141   KETONESUR NEGATIVE 01/13/2018 1923   PROTEINUR NEGATIVE 01/13/2018 1923   UROBILINOGEN 1.0 01/13/2014 1141   UROBILINOGEN 0.2 03/29/2010 0940   NITRITE NEGATIVE 01/13/2018 1923   LEUKOCYTESUR NEGATIVE 01/13/2018 1923   Sepsis Labs: @LABRCNTIP (procalcitonin:4,lacticidven:4)  ) Recent Results (from the past 240 hour(s))  Urine culture     Status: None   Collection Time: 01/13/18  7:23 PM  Result Value Ref Range Status   Specimen Description   Final    URINE, RANDOM Performed at [facility], [address] [place], [place] [address]    Special Requests   Final    NONE Performed at [facility], [address], [place], [place] [address]    Culture   Final    NO GROWTH Performed at [facility], [address] [place], [place] [address]    Report Status 01/15/2018 FINAL  Final  MRSA PCR Screening     Status: None   Collection Time: 01/13/18 11:43 PM  Result Value Ref Range Status   MRSA by PCR NEGATIVE NEGATIVE Final    Comment:        The GeneXpert MRSA Assay (FDA approved for NASAL specimens only), is one component of a comprehensive MRSA colonization surveillance program. It is not intended to diagnose MRSA infection nor to guide or monitor treatment for MRSA infections. Performed at [facility], [address], [place], [place] [address]       Radiology Studies: Dg Abd 1 View  Result Date: 01/16/2018 CLINICAL DATA:  72 year old male with constipation. EXAM: ABDOMEN - 1 VIEW COMPARISON:  KUB 01/14/2018.  CT Abdomen  and Pelvis 01/13/2018 FINDINGS: Portable AP supine view at 1008 hours. The bowel-gas pattern remains non obstructed. Mildly decreased retained stool in the colon over this series of exams. A moderate rectal stool burden persists. Stable visualized osseous structures. IMPRESSION: Only mildly decreased retained stool in the colon since 01/13/2018. Electronically Signed   By: Odessa Fleming M.D.   On: 01/16/2018 10:24     Scheduled Meds: . chlorhexidine  15 mL Mouth Rinse BID  .  feeding supplement (ENSURE ENLIVE)  237 mL Oral TID BM  . insulin aspart  0-9 Units Subcutaneous TID WC  . insulin glargine  15 Units Subcutaneous QHS  . mouth rinse  15 mL Mouth Rinse q12n4p  . metoCLOPramide (REGLAN) injection  5 mg Intravenous Q8H  . metoprolol succinate  12.5 mg Oral Daily  . mineral oil  1 enema Rectal Once  . polyethylene glycol  17 g Oral Daily  . potassium chloride  40 mEq Oral BID  . tamsulosin  0.4 mg Oral QPC breakfast   Continuous Infusions: . magnesium sulfate 1 - 4 g bolus IVPB 4 g (01/16/18 1219)     LOS: 3 days   Time Spent in minutes   45 minutes  Pearl Berlinger D.O. on 01/16/2018 at 1:27 PM  Between 7am to 7pm - Please see pager noted on [email or web address]  After 7pm go to [email or web address]  And look for the night coverage person covering for me after hours  [facility] Office  [phone]

## 2018-01-16 NOTE — Progress Notes (Signed)
Foley catheter removed without difficulty. Patient tolerated well.

## 2018-01-16 NOTE — Progress Notes (Signed)
Physical Therapy Treatment Patient Details Name: Jeremy Gill MRN: [id number] DOB: 31-Oct-1945 Today's Date: 01/16/2018    History of Present Illness pt was admitted for acute metabolic encephalopathy.  He was admitted with similar dx in May '19.  PMH:  DM, HTN, COPD, chronic back pain and neuropathy.  Pressure R heel    PT Comments    Pt in bed with spouse and Mother in [place] in room.  Required MAX encouragement to participate.  Assisted OOB to amb a limited distance due to weakness/fatigue.  Spouse reports poor eating and weight loss.  Assisted back to bed per RN request for an enema.     Follow Up Recommendations  SNF;Supervision/Assistance - 24 hour     Equipment Recommendations       Recommendations for Other Services       Precautions / Restrictions Restrictions Weight Bearing Restrictions: No    Mobility  Bed Mobility Overal bed mobility: Needs Assistance Bed Mobility: Supine to Sit;Sit to Supine   Sidelying to sit: Min assist;HOB elevated   Sit to supine: Mod assist   General bed mobility comments: extra time, light assistance with 1 UE to scoot to bed edge then increased assist back to bed B LE due to fatigue/weakness  Transfers Overall transfer level: Needs assistance Equipment used: Rolling walker (2 wheeled) Transfers: Sit to/from [employer] Transfers Sit to Stand: Min assist;From elevated surface Stand pivot transfers: Min assist;+2 safety/equipment       General transfer comment: 255 VC's on proper hand placement esp with stand to sit and safety with turns  Ambulation/Gait Ambulation/Gait assistance: Min assist Gait Distance (Feet): 45 Feet Assistive device: Rolling walker (2 wheeled) Gait Pattern/deviations: Step-through pattern;Step-to pattern;Shuffle Gait velocity: decreased   General Gait Details: very slow gait with short steps and limited distance due to fatigue/weakness.     Stairs             Wheelchair Mobility     Modified Rankin (Stroke Patients Only)       Balance                                            Cognition Arousal/Alertness: Awake/alert Behavior During Therapy: WFL for tasks assessed/performed Overall Cognitive Status: Within Functional Limits for tasks assessed                                 General Comments: AxO x 3 low motivation      Exercises      General Comments        Pertinent Vitals/Pain Pain Assessment: Faces Faces Pain Scale: Hurts a little bit Pain Location: back Pain Descriptors / Indicators: Aching Pain Intervention(s): Monitored during session    Home Living                      Prior Function            PT Goals (current goals can now be found in the care plan section) Progress towards PT goals: Progressing toward goals    Frequency    Min 2X/week      PT Plan Current plan remains appropriate    Co-evaluation              AM-PAC PT "6 Clicks" Daily Activity  Outcome Measure  Difficulty turning over in bed (including adjusting bedclothes, sheets and blankets)?: A Little Difficulty moving from lying on back to sitting on the side of the bed? : A Lot Difficulty sitting down on and standing up from a chair with arms (e.g., wheelchair, bedside commode, etc,.)?: A Lot Help needed moving to and from a bed to chair (including a wheelchair)?: A Lot Help needed walking in hospital room?: A Lot Help needed climbing 3-5 steps with a railing? : Total 6 Click Score: 12    End of Session Equipment Utilized During Treatment: Gait belt Activity Tolerance: Patient tolerated treatment well Patient left: with chair alarm set;with call bell/phone within reach;with family/visitor present;in bed Nurse Communication: Mobility status(RN observed) PT Visit Diagnosis: Unsteadiness on feet (R26.81);Pain     Time: 1330-1355 PT Time Calculation (min) (ACUTE ONLY): 25 min  Charges:  $Gait Training: 8-22  mins $Therapeutic Activity: 8-22 mins                     Felecia ShellingLori Burrel Legrand  PTA [facility]  Acute  Rehab Pager      [phone]

## 2018-01-17 LAB — BASIC METABOLIC PANEL WITH GFR
Anion gap: 11 (ref 5–15)
BUN: 15 mg/dL (ref 8–23)
CO2: 28 mmol/L (ref 22–32)
Calcium: 8.6 mg/dL — ABNORMAL LOW (ref 8.9–10.3)
Chloride: 103 mmol/L (ref 98–111)
Creatinine, Ser: 0.8 mg/dL (ref 0.61–1.24)
GFR calc Af Amer: >60 mL/min
GFR calc non Af Amer: >60 mL/min
Glucose, Bld: 227 mg/dL — ABNORMAL HIGH (ref 70–99)
Potassium: 3.5 mmol/L (ref 3.5–5.1)
Sodium: 142 mmol/L (ref 135–145)

## 2018-01-17 LAB — GLUCOSE, CAPILLARY
GLUCOSE-CAPILLARY: 87 mg/dL (ref 70–99)
Glucose-Capillary: 219 mg/dL — ABNORMAL HIGH (ref 70–99)

## 2018-01-17 LAB — MAGNESIUM: Magnesium: 1.8 mg/dL (ref 1.7–2.4)

## 2018-01-17 LAB — CBC
HCT: 27.5 % — ABNORMAL LOW (ref 39.0–52.0)
Hemoglobin: 9.4 g/dL — ABNORMAL LOW (ref 13.0–17.0)
MCH: 30 pg (ref 26.0–34.0)
MCHC: 34.2 g/dL (ref 30.0–36.0)
MCV: 87.9 fL (ref 78.0–100.0)
PLATELETS: 267 10*3/uL (ref 150–400)
RBC: 3.13 MIL/uL — AB (ref 4.22–5.81)
RDW: 14 % (ref 11.5–15.5)
WBC: 8.8 10*3/uL (ref 4.0–10.5)

## 2018-01-17 MED ORDER — TAMSULOSIN HCL 0.4 MG PO CAPS: 0.4000 mg | ORAL_CAPSULE | Freq: Every day | ORAL | 0 refills | Status: DC

## 2018-01-17 MED ORDER — POLYETHYLENE GLYCOL 3350 17 G PO PACK: 17.0000 g | PACK | Freq: Every day | ORAL | 0 refills | Status: DC

## 2018-01-17 MED ORDER — ENSURE ENLIVE PO LIQD: 1.0000 | Freq: Three times a day (TID) | ORAL | 0 refills | Status: DC

## 2018-01-17 NOTE — Progress Notes (Addendum)
[facility] notification that [facility]F [facility] was declined. Discussed with pt- he states when wife arrives today they will discuss plans (CSW mentioned potential options of private pay for SNF versus pt returning directly home and utilizing home health services). Will follow up.  Ilean SkillMeghan Alizabeth Antonio, MSW, LCSW Clinical Social Work 01/17/2018 [phone]  13:56- pt and wife decide that they will have pt return home at DC and they are open to home health services for therapy. States they would like [facility].

## 2018-01-17 NOTE — Progress Notes (Signed)
Inpatient Diabetes Program Recommendations  AACE/ADA: New Consensus Statement on Inpatient Glycemic Control (2015)  Target Ranges:  Prepandial:   less than 140 mg/dL      Peak postprandial:   less than 180 mg/dL (1-2 hours)      Critically ill patients:  140 - 180 mg/dL   Lab Results  Component Value Date   GLUCAP 87 01/17/2018   HGBA1C 9.1 (H) 01/14/2018    Review of Glycemic Control  Diabetes history: DM2 Outpatient Diabetes medications: Lantus 20-25 units QHS, metformin 1000 mg bid, previously on Victoza 0.6 mg QD Current orders for Inpatient glycemic control: Lantus 15 units QHS, Novolog 0-9 units tidwc  Post-prandials tend to run > 180 mg/dL. May benefit from meal coverage insulin.  Inpatient Diabetes Program Recommendations:     Novolog 3 units tidwc for meal coverage insulin if pt eats > 50% meal.  Will continue to closely monitor.  Thank you. Ailene Ardshonda Cristiano Capri, RD, LDN, CDE Inpatient Diabetes Coordinator [phone]
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
## 2018-01-17 NOTE — Discharge Summary (Addendum)
Physician Discharge Summary  Jeremy Gill ZOX:[id number] DOB: 21-Jan-1946 DOA: 01/13/2018  PCP: [place], [name], MD  Admit date: 01/13/2018 Discharge date: 01/17/2018  Time spent: 45 minutes  Recommendations for Outpatient Follow-up:  Patient will be discharged to home with home health physical and occupational therapy.  Patient will need to follow up with primary care provider within one week of discharge, repeat CBC, BMP, magnesium.  Patient should continue medications as prescribed.  Patient should follow a heart healthy/carb modified diet.   Discharge Diagnoses:  Acute metabolic encephalopathy Acute kidney injury in the setting of acute urinary retention Obstipation Chronic pain disorder COPD  Diabetes mellitus, type II with neuropathy Essential hypertension Chronic normocytic anemia Mild hyponatremia Hypokalemia Hypomagnesemia Physical deconditioning Severe malnutrition  Discharge Condition: Stable  Diet recommendation: heart healthy/carb modified   Filed Weights   01/14/18 0147 01/15/18 1403  Weight: 70.1 kg 70 kg    History of present illness:  On 01/13/2018 by Dr. Madelaine Etienne Bullardis a 72 y.o.malewith medical history significant of DM2, COPD, HTN, diabetic neuropathy  Presented withwildly fluctuating blood glucose. Have been more confusedas per family for the past 2 days.For the past 2 days have been unstable, too weak to get out of the bed. He has been leaning to his right. Wife noted left side of the face is droopy for the past 2-3 months. No new medications.Wife checked his blood sugars yesterday was in 50s and went up to 600 With orange juice and some Candycalled EMS insulin was administered in today in the morning again was hypoglycemic used to be on Lantus 20 units and metformin also has been dosing himself with with Victoza as needed since it is expensive not taking on a regular basis. His blood sugar has been running in 170s to  200 and he was seen in July by his primary care provider who increased Lantus to 25 units and instructed to continue to titrate up by 5 units a week. Last night wife gave 25 units of Lantus. She has been given mostly 20 units but occasionally has given him 25 units when BG has been running high.   Wife states for the past 2 days patient has not been able to urinate he has been trying to stand up to do so but could not empty his bladder.  Hospital Course:  Acute metabolic encephalopathy -Likely multifactorial including combination of dehydration with decreased oral intake -Mental status has improved. Per wife at beside, appears to be back to baseline. -Continue to treat underlying etiologies  Acute kidney injury in the setting of acute urinary retention -Foley catheter has been placed -Creatinine on admission 1.50, has improved mildly to 0.80 (baseline appears to be 0.9-1.1) -Continue IV fluids and monitor BMP -?  Secondary to obstipation -UA showed rare bacteria, 6-10 WBC, negative leukocytes or nitrites. -Urine culture no growth -CT renal stone study showed marked urinary bladder distention with 1.91 L.  No obstructing calculus prostamegaly identified.  Moderate hydroureteronephrosis.  Perinephric fat stranding seen which is nonspecific.  Increased fecal retention throughout the colon consistent with constipation.  Fecal impaction noted in the fecal vault with mild moderate mural thickening of the rectum, possibility of stercoral proctitis. -Placed on Flomax -Voiding trial successful   Obstipation -Possibly secondary to narcotic use -CT as above -Abd xray reviewed, continues to show moderate stool burden -Have ordered mineral oil enema, manual disimpaction, scheduled daily miralax- patient successfully had a bowel movement   Chronic pain disorder -Avoiding narcotics given severe  obstipation and altered mental status -discussed with patient's wife, denies narcotic use, only  tramadol  COPD  -Stable, continue DuoNeb treatments as needed  Diabetes mellitus, type II with neuropathy -continue home medications upon discharge   Essential hypertension -continue metoprolol   Chronic normocytic anemia -Hemoglobin down to 9.4 (suspect drop is dilutional given IVF- which have been discontinued) -repeat CBC in one week  Mild hyponatremia -Resolved  Hypokalemia -Continue to replace and monitor BMP  Hypomagnesemia -replaced, repeat magnesium in one week  Physical deconditioning -PT consulted and recommended SNF, however insurance not covering this -will discharge patient with HH PT and OT  Severe malnutrition -Nutrition consulted, continue supplements  Procedures: None  Consultations: None  Discharge Exam: Vitals:   01/17/18 0521 01/17/18 1435  BP: 126/66 130/72  Pulse: 98 87  Resp: 16 17  Temp: 98.4 F (36.9 C) 97.9 F (36.6 C)  SpO2: 99% 100%     General: Well developed, well nourished, NAD, appears stated age  HEENT: NCAT, mucous membranes moist. Poor dentition.  Neck: Supple  Cardiovascular: S1 S2 auscultated, RRR, no murmur  Respiratory: Clear to auscultation bilaterally with equal chest rise  Abdomen: Soft, nontender, nondistended, + bowel sounds  Extremities: warm dry without cyanosis clubbing. Trace LE edema.  Neuro: AAOx3, nonfocal  Psych: Appropriate mood and affect, pleasant   Discharge Instructions Discharge Instructions    Discharge instructions   Complete by:  As directed    Patient will be discharged to home with home health physical and occupational therapy.  Patient will need to follow up with primary care provider within one week of discharge, CBC, BMP, magnesium.  Patient should continue medications as prescribed.  Patient should follow a heart healthy/carb modified diet.     Allergies as of 01/17/2018   No Known Allergies     Medication List    TAKE these medications   ACCU-CHEK SOFTCLIX  LANCETS lancets Use as instructed   aspirin 81 MG chewable tablet Chew 81 mg by mouth 2 (two) times a week. Pt takes on Monday and Friday.   atorvastatin 40 MG tablet Commonly known as:  LIPITOR Take 1 tablet (40 mg total) by mouth daily.   baclofen 10 MG tablet Commonly known as:  LIORESAL TAKE 1 TABLET BY MOUTH 3 TIMES DAILY   collagenase ointment Commonly known as:  SANTYL Apply 1 application topically daily.   diclofenac 50 MG EC tablet Commonly known as:  VOLTAREN Take 1 tablet (50 mg total) by mouth 3 (three) times daily.   DULoxetine 60 MG capsule Commonly known as:  CYMBALTA TAKE 1 CAPSULE BY MOUTH EVERY DAY   feeding supplement (ENSURE ENLIVE) Liqd Take 237 mLs by mouth 3 (three) times daily between meals.   fluocinonide cream 0.05 % Commonly known as:  LIDEX Apply topically 3 (three) times daily as needed. For itching What changed:    how much to take  reasons to take this  additional instructions   gabapentin 600 MG tablet Commonly known as:  NEURONTIN Take 1 tablet (600 mg total) by mouth 3 (three) times daily.   glucose blood test strip Use to test blood sugar three-four times daily.   Insulin Glargine 100 UNIT/ML Solostar Pen Commonly known as:  LANTUS Inject 20-25 units into skin at 10 pm daily. If blood sugar is over 200, increase by 5 units every 1 to 2 weeks.   INSULIN SYRINGE .5CC/31GX5/16" 31G X 5/16" 0.5 ML Misc USE AS DIRECTED WITH LANTUS   ipratropium-albuterol 0.5-2.5 (3)  MG/3ML Soln Commonly known as:  DUONEB Take 3 mLs by nebulization every 6 (six) hours as needed. What changed:  reasons to take this   metFORMIN 500 MG tablet Commonly known as:  GLUCOPHAGE Take 2 tablets (1,000 mg total) by mouth 2 (two) times daily with a meal.   metoprolol succinate 25 MG 24 hr tablet Commonly known as:  TOPROL-XL Take 0.5 tablets (12.5 mg total) by mouth daily.   polyethylene glycol packet Commonly known as:  MIRALAX / GLYCOLAX Take 17  g by mouth daily. Start taking on:  01/18/2018   tamsulosin 0.4 MG Caps capsule Commonly known as:  FLOMAX Take 1 capsule (0.4 mg total) by mouth daily after breakfast. Start taking on:  01/18/2018   traMADol 50 MG tablet Commonly known as:  ULTRAM TAKE 1 TABLET BY MOUTH EVERY 6 HOURS as needed for pain   triamcinolone ointment 0.1 % Commonly known as:  KENALOG Apply topically 2 (two) times daily. 1:1 compound with Eucerin What changed:    how much to take  when to take this  reasons to take this  additional instructions      No Known Allergies Follow-up Information    [place], [name], MD. Schedule an appointment as soon as possible for a visit in 1 week(s).   Specialty:  Family Medicine Why:  Hospital follow up Contact information: [address] [place] [facility] [place] [address] [phone]            The results of significant diagnostics from this hospitalization (including imaging, microbiology, ancillary and laboratory) are listed below for reference.    Significant Diagnostic Studies: Dg Abd 1 View  Result Date: 01/16/2018 CLINICAL DATA:  72 year old male with constipation. EXAM: ABDOMEN - 1 VIEW COMPARISON:  KUB 01/14/2018.  CT Abdomen and Pelvis 01/13/2018 FINDINGS: Portable AP supine view at 1008 hours. The bowel-gas pattern remains non obstructed. Mildly decreased retained stool in the colon over this series of exams. A moderate rectal stool burden persists. Stable visualized osseous structures. IMPRESSION: Only mildly decreased retained stool in the colon since 01/13/2018. Electronically Signed   By: Odessa Fleming M.D.   On: 01/16/2018 10:24   Dg Abd 1 View  Result Date: 01/14/2018 CLINICAL DATA:  Abdominal distension without tenderness. Reportedly normal bowel movements. Recent hyper and hypo glycemia with some mental status change. EXAM: ABDOMEN - 1 VIEW COMPARISON:  Abdominal CT scan of January 13, 2018. FINDINGS: The colonic stool burden is increased diffusely.  The stool burden within the rectum is also increased. There are no abnormal soft tissue calcifications. The bony structures are unremarkable. IMPRESSION: Increased colonic stool burden compatible with constipation in the appropriate clinical setting. Increased rectal stool burden may reflect an impaction in the appropriate setting. No evidence of ileus, obstruction, or perforation. Electronically Signed   By: David  [place] M.D.   On: 01/14/2018 08:59   Ct Head Wo Contrast  Result Date: 01/13/2018 CLINICAL DATA:  Weakness.  Mental status changes.  Diabetes. EXAM: CT HEAD WITHOUT CONTRAST TECHNIQUE: Contiguous axial images were obtained from the base of the skull through the vertex without intravenous contrast. COMPARISON:  10/15/2017 CT.  10/01/2017 MRI. FINDINGS: Brain: Generalized atrophy. Advanced chronic small-vessel ischemic changes of the cerebral hemispheric white matter. No sign of acute infarction, mass lesion, hemorrhage, hydrocephalus or extra-axial collection. Vascular: There is atherosclerotic calcification of the major vessels at the base of the brain. Skull: Negative Sinuses/Orbits: Clear/normal Other: None IMPRESSION: Atrophy and chronic small-vessel ischemic changes. No acute finding by  CT. Electronically Signed   By: Paulina Fusi M.D.   On: 01/13/2018 18:55   Mr Brain Wo Contrast  Result Date: 01/14/2018 CLINICAL DATA:  Altered mental status. Possible stroke. Diminished responsiveness. EXAM: MRI HEAD WITHOUT CONTRAST TECHNIQUE: Multiplanar, multiecho pulse sequences of the brain and surrounding structures were obtained without intravenous contrast. COMPARISON:  CT 2019.  MRI 10/01/2017. FINDINGS: Brain: Diffusion imaging does not show any acute or subacute infarction. There are advanced chronic small-vessel ischemic changes throughout the pons in the cerebral hemispheric white matter. There are old lacunar infarctions affecting the thalami, basal ganglia and deep white matter. No large  vessel territory infarction. No mass lesion, hemorrhage, hydrocephalus or extra-axial collection. Vascular: Major vessels at the base of the brain show flow. Skull and upper cervical spine: Negative Sinuses/Orbits: Clear/normal Other: None IMPRESSION: No change since the previous exams. No acute or subacute finding. 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 Electronically Signed   By: Paulina Fusi M.D.   On: 01/14/2018 07:31   Dg Chest Port 1 View  Result Date: 01/13/2018 CLINICAL DATA:  Low blood sugar last evening with dyspnea. EXAM: PORTABLE CHEST 1 VIEW COMPARISON:  10/15/2017 FINDINGS: Heart size is normal. There is mild aortic atherosclerosis without aneurysm. Mild pulmonary hyperinflation is redemonstrated without acute pulmonary consolidation. Right axillary arterial calcifications are noted. No acute nor suspicious osseous lesions. IMPRESSION: Hyperinflated lungs without acute pulmonary consolidation or CHF. Aortic atherosclerosis. Electronically Signed   By: Tollie Eth M.D.   On: 01/13/2018 17:37   Ct Renal Stone Study  Result Date: 01/13/2018 CLINICAL DATA:  Hematuria and leukocytosis. EXAM: CT ABDOMEN AND PELVIS WITHOUT CONTRAST TECHNIQUE: Multidetector CT imaging of the abdomen and pelvis was performed following the standard protocol without IV contrast. COMPARISON:  None. FINDINGS: Lower chest: Normal size heart without pericardial effusion. Mild peribronchial thickening to the lower lobes. Minimal bibasilar atelectasis. Hepatobiliary: Unremarkable unenhanced appearance of the liver. No biliary dilatation. Physiologic distention of the gallbladder. No cholelithiasis. Subtle dependent hyperdensity within the gallbladder may represent biliary sludge. Pancreas: Atrophic without mass or ductal dilatation. Spleen: Normal Adrenals/Urinary Tract: Marked distention of the urinary bladder to above the level  of the umbilicus measuring 19.5 x 12.9 x 14.5 cm (volume = 1910 cm^3). No focal mural thickening or calculi. Dilatation of the renal collecting systems bilaterally may be secondary to the full bladder. No obstructing calculus or definite intramural mass is apparent. Nonspecific mild perinephric fat stranding is seen bilaterally. Urinary tract infection it is not entirely excluded. Normal bilateral adrenal glands. Stomach/Bowel: There is a significant amount of fecal retention throughout the colon fecal impaction in the rectum. Stercoral proctitis is also raised given mild moderate transmural thickening of the rectum. No small bowel dilatation. The stomach is physiologically distended with ingested liquids. A hyperdensity within the dependent aspect of the stomach may represent ingested pill. Vascular/Lymphatic: Moderate aortoiliac and branch vessel atherosclerosis. Lymphadenopathy. Reproductive: Prostate is normal size. Seminal vesicles a partially calcified in appearance. Other: No free air nor free fluid. Musculoskeletal: Degenerative disc disease L5-S1. No aggressive osseous lesions. IMPRESSION: Marked urinary bladder distention with overall volume approximately 1.91 L. No obstructing calculus prostatomegaly is identified. Question neurogenic bladder. This is resulting in moderate hydroureteronephrosis. Perinephric fat stranding is seen which is nonspecific but can be seen in urinary tract infections and therefore clinical correlation is recommended. Subtle hyperdensity along the dependent aspect of the gallbladder may represent biliary sludge. No calculi are  identified. No secondary signs of acute cholecystitis. Increased fecal retention throughout the colon consistent constipation. Fecal impaction is also noted in the rectal vault with mild moderate transmural thickening of the rectum raising the possibility a stercoral proctitis Electronically Signed   By: Tollie Eth M.D.   On: 01/13/2018 22:26     Microbiology: Recent Results (from the past 240 hour(s))  Urine culture     Status: None   Collection Time: 01/13/18  7:23 PM  Result Value Ref Range Status   Specimen Description   Final    URINE, RANDOM Performed at [facility], [address], [place], [place] [address]    Special Requests   Final    NONE Performed at [facility], [address] [place], [place] [address]    Culture   Final    NO GROWTH Performed at [facility], [address] [place], [place] [address]    Report Status 01/15/2018 FINAL  Final  MRSA PCR Screening     Status: None   Collection Time: 01/13/18 11:43 PM  Result Value Ref Range Status   MRSA by PCR NEGATIVE NEGATIVE Final    Comment:        The GeneXpert MRSA Assay (FDA approved for NASAL specimens only), is one component of a comprehensive MRSA colonization surveillance program. It is not intended to diagnose MRSA infection nor to guide or monitor treatment for MRSA infections. Performed at [facility], [address] [place], [place] [address]      Labs: Basic Metabolic Panel: Recent Labs  Lab 01/13/18 1747 01/13/18 1757 01/14/18 0328 01/15/18 0321 01/16/18 0409 01/17/18 0344  NA 141 140 146* 141 139 142  K 4.4 4.3 4.5 3.0* 3.0* 3.5  CL 102 102 108 106 102 103  CO2 26  --  30 27 28 28   GLUCOSE 119* 117* 74 172* 207* 227*  BUN 32* 33* 27* 18 16 15   CREATININE 1.44* 1.50* 1.21 0.83 0.84 0.80  CALCIUM 9.2  --  9.2 8.6* 8.9 8.6*  MG  --   --  1.7 1.4*  --  1.8  PHOS  --   --  4.7*  --   --   --    Liver Function Tests: Recent Labs  Lab 01/13/18 1747 01/14/18 0328  AST 25 21  ALT 16 13  ALKPHOS 39 35*  BILITOT 0.8 0.8  PROT 7.6 6.7  ALBUMIN 3.7 3.2*   No results for input(s): LIPASE, AMYLASE in the last 168 hours. No results for input(s): AMMONIA in the last 168 hours. CBC: Recent Labs  Lab 01/13/18 1747 01/13/18 1757 01/14/18 0328  01/15/18 0321 01/16/18 0409 01/17/18 0344  WBC 13.6*  --  12.7* 10.8* 10.7* 8.8  NEUTROABS 11.8*  --   --   --   --   --   HGB 11.7* 12.2* 11.0* 9.8* 10.0* 9.4*  HCT 35.3* 36.0* 32.8* 28.9* 28.8* 27.5*  MCV 90.3  --  90.1 88.7 87.0 87.9  PLT 311  --  302 271 259 267   Cardiac Enzymes: No results for input(s): CKTOTAL, CKMB, CKMBINDEX, TROPONINI in the last 168 hours. BNP: BNP (last 3 results) No results for input(s): BNP in the last 8760 hours.  ProBNP (last 3 results) No results for input(s): PROBNP in the last 8760 hours.  CBG: Recent Labs  Lab 01/16/18 1219 01/16/18 1708 01/16/18 2107 01/17/18 0720 01/17/18 1225  GLUCAP 258* 267* 277* 87 219*  Signed:  Edsel Petrin  [facility] 01/17/2018, 3:13 PM

## 2018-01-17 NOTE — Care Management Important Message (Signed)
Important Message  Patient Details  Name: Jeremy SalinesJames E Trick MRN: [id number] Date of Birth: 07-04-1945   Medicare Important Message Given:  Yes    Caren MacadamFuller, Irineo Gaulin 01/17/2018, 10:23 AMImportant Message  Patient Details  Name: Jeremy SalinesJames E Ahr MRN: [id number] Date of Birth: 07-04-1945   Medicare Important Message Given:  Yes    Caren MacadamFuller, Samer Dutton 01/17/2018, 10:23 AM

## 2018-01-17 NOTE — Progress Notes (Signed)
  Speech Language Pathology Treatment: Dysphagia  Patient Details Name: Jeremy Gill MRN: [id number] DOB: Dec 17, 1945 Today's Date: 01/17/2018 Time: 1110-1130 SLP Time Calculation (min) (ACUTE ONLY): 20 min  Assessment / Plan / Recommendation Clinical Impression  Pt seen at bedside for follow up after BSE completed earlier this week. Per RN, pt is tolerating whole meds and current diet (Dys2/thin) but has had a poor appetite. Pt's wife indicates pt does not eat the dys 2 foods because he does not like them. SLP provided education re: rationale for that recommendation (energy conservation). Pt reports he feels stronger, and would be able to manage advanced consistencies. Recommended choosing softer solids for continued energy conservation. Diet advanced to regular/carb mod with thin liquids. SLP will follow up for assessment of diet tolerance and education. [facility]. RN and MD informed of recommendations.    HPI HPI: 72 year old male admitted 01/13/18 with AMS. PMH: DM, neuropathy, COPD, HTN. 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      SLP Plan  Continue with current plan of care       Recommendations  Diet recommendations: Regular;Thin liquid Liquids provided via: Cup;Straw Medication Administration: Whole meds with liquid Supervision: Patient able to self feed;Intermittent supervision to cue for compensatory strategies Compensations: Minimize environmental distractions;Slow rate;Small sips/bites Postural Changes and/or Swallow Maneuvers: Seated upright 90 degrees;Upright 30-60 min after meal                Oral Care Recommendations: Oral care QID Follow up Recommendations: None SLP Visit Diagnosis: Dysphagia, unspecified (R13.10) Plan: Continue with current plan of care       GO              Jeremy Tsuchiya B. Murvin NatalBueche, [facility] County HospitalMSP, CCC-SLP Speech Language  Pathologist [phone]  Jeremy Gill 01/17/2018, 11:32 AM

## 2018-01-17 NOTE — Care Management Note (Signed)
Case Management Note  Per CSW, pt to dc home since insurance declined snf. Choice offered and [facility] chosen for home health services. [facility] rep alerted of referral. Will need orders for HHPT/OT/RN/AIde.  Additional CommentsBartholome Bill:  Grier Czerwinski H, RN 01/17/2018, 1:56 PM [phone]

## 2018-01-17 NOTE — Discharge Instructions (Signed)

## 2018-01-20 ENCOUNTER — Telehealth: Payer: Self-pay | Admitting: Family Medicine

## 2018-01-20 NOTE — Telephone Encounter (Signed)
Transition Care Management Follow-up Telephone Call  Jeremy SalinesJames E Gill RUE:[id number] DOB: 06/10/1945 DOA: 01/13/2018  PCP: SwazilandJordan, [name], MD  Admit date: 01/13/2018 Discharge date: 01/17/2018  Time spent: 45 minutes  Recommendations for Outpatient Follow-up:  Patient will be discharged to home with home health physical and occupational therapy.  Patient will need to follow up with primary care provider within one week of discharge, repeat CBC, BMP, magnesium.  Patient should continue medications as prescribed.  Patient should follow a heart healthy/carb modified diet.   Discharge Diagnoses:  Acute metabolic encephalopathy Acute kidney injury in the setting of acute urinary retention Obstipation Chronic pain disorder COPD  Diabetes mellitus, type II with neuropathy Essential hypertension Chronic normocytic anemia Mild hyponatremia Hypokalemia Hypomagnesemia Physical deconditioning Severe malnutrition   How have you been since you were released from the hospital? "better, some what"   Do you understand why you were in the hospital? yes   Do you understand the discharge instructions? yes   Where were you discharged to? Home   Items Reviewed:  Medications reviewed: yes  Allergies reviewed: yes  Dietary changes reviewed: yes  Referrals reviewed: yes   Functional Questionnaire:   Activities of Daily Living (ADLs):   He states they are independent in the following: ambulation, bathing and hygiene, feeding, continence, grooming, toileting and dressing States they require assistance with the following: none   Any transportation issues/concerns?: no   Any patient concerns? no   Confirmed importance and date/time of follow-up visits scheduled yes Provider Appointment booked with Dr. SwazilandJordan on 01/29/2018 Wednesday at 2:00 pm  Confirmed with patient if condition begins to worsen call PCP or go to the ER.  Patient was given the office number and encouraged to  call back with question or concerns.  : yes

## 2018-01-22 ENCOUNTER — Other Ambulatory Visit: Payer: Self-pay

## 2018-01-22 NOTE — Patient Outreach (Signed)
[facility]) Care Management  01/22/2018  Lazarus SalinesJames E Sipe 10/17/1945 [id number]  72 year old [facility] 30 day post discharge medication review.  PMHx includes, but not limited to, hypertension, venous insufficiency, type 2 diabetes mellitus, hyperlipidemia and osteoarthritis.   Unsuccessful outreach attempt #1 to Mr. Larence PenningBullard.  Left HIPAA compliant voice message requesting a return call.  Plan: Outreach attempt #2 next week.  Berlin HunJennifer Wilbon Obenchain, PharmD Clinical Pharmacist [facility] [phone]

## 2018-01-29 ENCOUNTER — Inpatient Hospital Stay: Payer: PPO | Admitting: Family Medicine

## 2018-01-30 ENCOUNTER — Other Ambulatory Visit: Payer: Self-pay

## 2018-01-30 ENCOUNTER — Ambulatory Visit: Payer: Self-pay

## 2018-01-30 NOTE — Patient Outreach (Signed)
  [facility]) Care Management  01/30/2018  Jeremy Gill [birth year] [id number]  72 year old male outreached by [facility] services for a 30 day post discharge medication review.  PMHx includes, but not limited to, hypertension, venous insufficiency, type 2 diabetes mellitus, hyperlipidemia and osteoarthritis.   Unsuccessful outreach attempt #2 to Jeremy Gill.  Left HIPAA compliant voice message requesting a return call.  Plan: Outreach attempt #3 next week.  Berlin Hun, PharmD Clinical Pharmacist [facility] [phone]

## 2018-02-03 ENCOUNTER — Encounter: Payer: Self-pay | Admitting: Podiatry

## 2018-02-03 ENCOUNTER — Ambulatory Visit: Payer: PPO | Admitting: Podiatry

## 2018-02-03 DIAGNOSIS — I70235 Atherosclerosis of native arteries of right leg with ulceration of other part of foot: Secondary | ICD-10-CM | POA: Diagnosis not present

## 2018-02-03 DIAGNOSIS — E0843 Diabetes mellitus due to underlying condition with diabetic autonomic (poly)neuropathy: Secondary | ICD-10-CM

## 2018-02-03 DIAGNOSIS — L97412 Non-pressure chronic ulcer of right heel and midfoot with fat layer exposed: Secondary | ICD-10-CM

## 2018-02-04 ENCOUNTER — Other Ambulatory Visit: Payer: Self-pay

## 2018-02-04 ENCOUNTER — Ambulatory Visit: Payer: Self-pay

## 2018-02-04 NOTE — Patient Outreach (Signed)
[facility]) Care Management  [facility]  02/04/18  Jeremy Gill [birth year] [id number]  72year old male [facility] day post discharge medication review. PMHx includes, but not limited to, hypertension, venous insufficiency, type 2 diabetes mellitus, hyperlipidemia and osteoarthritis.  Unsuccessful outreach attempt #3 to Jeremy Gill. Left HIPAA compliant voice message requesting a return call.  Plan: Will make no further outreach attempts to complete a medication review at this time.   Berlin Hun, PharmD Clinical Pharmacist [facility] [phone]   Addendum: Incoming message received from Jeremy Gill wife, Jeremy Gill. Successful return call placed to Mrs.  Gill.  HIPAA identifiers verified.  Subjective: Jeremy Gill reports that her husband is feeling "good."  She states that he had a recent hospitalization for hypoglycemia. She reports that she checks his glucose several times a day and that some mornings his glucose may be in the 60's or 70's if he gets up late and hasn't eaten.  She reports that she "keeps an eye on it."  She has accepted referral to a [facility] diabetes health coach.  Wife states he has a PCP visit tomorrow.   Objective:  HgA1c 9.1 % 01/14/18 down from 11.1% on 04/16/17 SCr 0.8 mg/dL Total cholesterol of 629 mg/dL, HDL of 58 mg/dL and LDL 43mg /dL    Current Medications: Current Outpatient Medications  Medication Sig Dispense Refill  . ACCU-CHEK SOFTCLIX LANCETS lancets Use as instructed 100 each 12  . aspirin 81 MG chewable tablet Chew 81 mg by mouth 2 (two) times a week. Pt takes on Monday and Friday.    Marland Kitchen atorvastatin (LIPITOR) 40 MG tablet Take 1 tablet (40 mg total) by mouth daily. 90 tablet 3  . baclofen (LIORESAL) 10 MG tablet TAKE 1 TABLET BY MOUTH 3 TIMES DAILY 90 tablet 1  . collagenase (SANTYL) ointment Apply 1 application topically daily. 30 g 1  .  diclofenac (VOLTAREN) 50 MG EC tablet Take 1 tablet (50 mg total) by mouth 3 (three) times daily. 90 tablet 1  . DULoxetine (CYMBALTA) 60 MG capsule TAKE 1 CAPSULE BY MOUTH EVERY DAY 30 capsule 2  . feeding supplement, ENSURE ENLIVE, (ENSURE ENLIVE) LIQD Take 237 mLs by mouth 3 (three) times daily between meals. 90 Bottle 0  . fluocinonide cream (LIDEX) 0.05 % Apply topically 3 (three) times daily as needed. For itching (Patient taking differently: Apply 1 application topically 3 (three) times daily as needed (for itching). ) 30 g 3  . gabapentin (NEURONTIN) 600 MG tablet Take 1 tablet (600 mg total) by mouth 3 (three) times daily. 90 tablet 0  . glucose blood test strip Use to test blood sugar three-four times daily. 400 each 3  . Insulin Glargine (LANTUS SOLOSTAR) 100 UNIT/ML Solostar Pen Inject 20-25 units into skin at 10 pm daily. If blood sugar is over 200, increase by 5 units every 1 to 2 weeks. 5 pen PRN  . Insulin Syringe-Needle U-100 (INSULIN SYRINGE .5CC/31GX5/16") 31G X 5/16" 0.5 ML MISC USE AS DIRECTED WITH LANTUS 100 each 5  . metFORMIN (GLUCOPHAGE) 500 MG tablet Take 2 tablets (1,000 mg total) by mouth 2 (two) times daily with a meal. 120 tablet 4  . metoprolol succinate (TOPROL-XL) 25 MG 24 hr tablet Take 0.5 tablets (12.5 mg total) by mouth daily. 90 tablet 1  . polyethylene glycol (MIRALAX / GLYCOLAX) packet Take 17 g by mouth daily. 14 each 0  . tamsulosin (FLOMAX) 0.4 MG CAPS capsule  Take 1 capsule (0.4 mg total) by mouth daily after breakfast. 30 capsule 0  . traMADol (ULTRAM) 50 MG tablet TAKE 1 TABLET BY MOUTH EVERY 6 HOURS as needed for pain 135 tablet 0  . triamcinolone ointment (KENALOG) 0.1 % Apply topically 2 (two) times daily. 1:1 compound with Eucerin (Patient taking differently: Apply 1 application topically 2 (two) times daily as needed (rash). Compounded as a 1:1 mixture with Eucerin.) 453.6 g 3  . ipratropium-albuterol (DUONEB) 0.5-2.5 (3) MG/3ML SOLN Take 3 mLs by  nebulization every 6 (six) hours as needed. (Patient not taking: Reported on 01/20/2018) 360 mL 0   No current facility-administered medications for this visit.     Functional Status: In your present state of health, do you have any difficulty performing the following activities: 01/13/2018 10/01/2017  Hearing? N N  Vision? N N  Difficulty concentrating or making decisions? Malvin Johns  Walking or climbing stairs? Y Y  Dressing or bathing? Y Y  Doing errands, shopping? Y N  Some recent data might be hidden    Fall/Depression Screening: Fall Risk  11/01/2017 06/14/2017 12/26/2016  Falls in the past year? No Yes No  Comment - - -  Number falls in past yr: - 1 -  Injury with Fall? - No -  Risk for fall due to : - Mental status change;Other (Comment) -   PHQ 2/9 Scores 11/01/2017 12/26/2016 12/13/2016 10/30/2016 02/16/2016 02/08/2016 01/19/2014  PHQ - 2 Score 2 - 0 0 3 0 0  PHQ- 9 Score - - - - 7 - -  Exception Documentation - Other- indicate reason in comment box - - - - -  Not completed - call completed with spouse - - - - -   ASSESSMENT: Date Discharged from Hospital: 01/17/18 Date Medication Reconciliation Performed: 02/04/2018   New Medications at Discharge:   tamsulosin  Patient was recently discharged from hospital and all medications have been reviewed  Drugs sorted by system:  Neurologic/Psychologic: duloxetine  Cardiovascular: aspirin 81 mg, atorvastatin, metoprolol succinate  Pulmonary/Allergy: ipratropium/albuterol  Gastrointestinal: ensure, polyethylene glycol  Endocrine: insulin glargine, metformin   Topical: collagenase oint, fluocinonide cream, triamcinolone oint.  Pain: diclofenac, gabapentin, tramadol  Miscellaneous: baclofen, tamsulosin  Medications to avoid in the elderly:  Per the [employer] List, Diclofenac has an increased risk of gastrointestinal bleeding and peptic ulcer disease in the elderly.  Avoid chronic use, unless other alternatives are not effective and patient  can take gastroprotective agent (PPI).  Drug interactions:  Co-administration of SNRI (Duloxetine) with tramadol may result in the development of serotonin syndrome.  Other issues noted:  Patient's wife reports that he has been taking tamsulosin prn.  Informed her that this medication should be taken daily. Patient has recently experienced urinary retention.  She states that she will now give it daily as prescribed.   Encouraged wife to get a log to record his daily CBG values. I asked her record his insulin dose as well (since it can vary), along with comments about his diet.  We discussed the importance of using the log book to show glucose trends, which may help prevent future hypoglycemic episodes.  I also recommended that she buy glucose tablets to keep at home and for on the go.   PLAN: Order placed for [facility] diabetes healthcoach.  Route note to PCP, Dr. Swaziland.    Berlin Hun, PharmD Clinical Pharmacist [facility] [phone]

## 2018-02-04 NOTE — Patient Outreach (Deleted)
[facility]) Care Management  02/04/2018  Jeremy Gill 1945/09/02 [id number]  72year old [facility] day post discharge medication review. PMHx includes, but not limited to, hypertension, venous insufficiency, type 2 diabetes mellitus, hyperlipidemia and osteoarthritis.  Unsuccessful outreach attempt #3 to [name]. Left HIPAA compliant voice message requesting a return call.  Plan: Will make no further outreach attempts to complete a medication review at this time.   Berlin Hun, PharmD Clinical Pharmacist [facility] [phone]

## 2018-02-04 NOTE — Addendum Note (Signed)
Addended by: Berlin Hun D on: 02/04/2018 01:37 PM   Modules accepted: Orders

## 2018-02-05 ENCOUNTER — Encounter: Payer: Self-pay | Admitting: Family Medicine

## 2018-02-05 ENCOUNTER — Other Ambulatory Visit: Payer: Self-pay | Admitting: Physical Medicine & Rehabilitation

## 2018-02-05 ENCOUNTER — Ambulatory Visit (INDEPENDENT_AMBULATORY_CARE_PROVIDER_SITE_OTHER): Payer: PPO | Admitting: Family Medicine

## 2018-02-05 VITALS — BP 116/68 | HR 85 | Temp 98.6°F | Resp 16 | Ht 72.0 in | Wt 159.2 lb

## 2018-02-05 DIAGNOSIS — R600 Localized edema: Secondary | ICD-10-CM | POA: Diagnosis not present

## 2018-02-05 DIAGNOSIS — N179 Acute kidney failure, unspecified: Secondary | ICD-10-CM

## 2018-02-05 DIAGNOSIS — L299 Pruritus, unspecified: Secondary | ICD-10-CM | POA: Diagnosis not present

## 2018-02-05 DIAGNOSIS — E1149 Type 2 diabetes mellitus with other diabetic neurological complication: Secondary | ICD-10-CM

## 2018-02-05 DIAGNOSIS — I10 Essential (primary) hypertension: Secondary | ICD-10-CM | POA: Diagnosis not present

## 2018-02-05 MED ORDER — TRIAMCINOLONE ACETONIDE 0.1 % EX OINT: 1.0000 "application " | TOPICAL_OINTMENT | Freq: Two times a day (BID) | CUTANEOUS | 1 refills | Status: DC | PRN

## 2018-02-05 NOTE — Patient Instructions (Addendum)
A few things to remember from today's visit:   Diabetes mellitus type 2 with neurological manifestations (HCC) - Plan: triamcinolone ointment (KENALOG) 0.1 %  Essential hypertension  AKI (acute kidney injury) (HCC) HgA1C goal < 8.0. Avoid sugar added food:regular soft drinks, energy drinks, and sports drinks. candy. cakes. cookies. pies and cobblers. sweet rolls, pastries, and donuts. fruit drinks, such as fruitades and fruit punch. dairy desserts, such as ice cream  Mediterranean diet has showed benefits for sugar control.  How much and what type of carbohydrate foods are important for managing diabetes. The balance between how much insulin is in your body and the carbohydrate you eat makes a difference in your blood glucose levels.  Fasting blood sugar ideally 130 or less, 2 hours after meals less than 180.   Regular exercise also will help with controlling disease, daily brisk walking as tolerated for 15-30 min definitively will help. Try to get up at least at 10:30 AM  to avoid low blood sugars.  Avoid skipping meals, blood sugar might drop and cause serious problems. Remember checking feet periodically, good dental hygiene, and annual eye exam.      Please be sure medication list is accurate. If a new problem present, please set up appointment sooner than planned today.

## 2018-02-05 NOTE — Progress Notes (Signed)
HPI:   Jeremy Gill is a 72 y.o. male, who is here today with his wife to follow on recent hospitalization.   He was hospitalized from 8/19-8/23/2019. His wife noted MS changes and weakness.  Acute metabolic encephalopathy. AKI with acute urinary retention. Renal function back to normal before discharged. He takes Diclofenac 50 mg tid for chronic pain.   Lab Results  Component Value Date   CREATININE 0.80 01/17/2018   BUN 15 01/17/2018   NA 142 01/17/2018   K 3.5 01/17/2018   CL 103 01/17/2018   CO2 28 01/17/2018    Lab Results  Component Value Date   HGBA1C 9.1 (H) 01/14/2018   Following with podiatrist for ulcer right ankle lateral malleolus. According to wife , it is slowly healing.  Constipation: He is on Miralax daily as needed. He is on Tramadol for chronic back pain.  He denies abdominal pain,nausea,or vomiting. Last bowel movement today.  HTN:  No changes in antihypertensive meds. He is on Metoprolol Succinate 25 mg 1/2 tab daily.  He is not checking BP's.  HH service was not arranged. He is closed to his baseline. Gait assisted by his cane.  BS" "better" 200-300's He has had a few low BS's,50's. He goes to bed around 3 am now (5 am before) and getting up around 12 noon- 1 pm. His wife checks BS when he sleeps longer, usually low. He denies symptoms.  He is on Lantus 25 U daily,Metformin 1000 mg bid. He is not longer on Victoza due to cost.  His wife is concerned about LE edema,bilateral. He denies pain,erythema. It is worse at the end of the day and better in the morning when he gets up. He has compression stocking but he does not wear them.  No orthopnea,PND,or exertional dyspnea.   Review of Systems  Constitutional: Positive for fatigue. Negative for activity change, appetite change and fever.  HENT: Negative for nosebleeds, sore throat and trouble swallowing.   Eyes: Negative for redness and visual disturbance.    Respiratory: Negative for cough, shortness of breath and wheezing.   Cardiovascular: Positive for leg swelling. Negative for chest pain and palpitations.  Gastrointestinal: Negative for abdominal pain, nausea and vomiting.  Endocrine: Negative for polydipsia, polyphagia and polyuria.  Genitourinary: Negative for decreased urine volume, dysuria and hematuria.  Musculoskeletal: Positive for back pain and gait problem.  Skin: Positive for wound.  Neurological: Negative for syncope, weakness and headaches.      Current Outpatient Medications on File Prior to Visit  Medication Sig Dispense Refill  . ACCU-CHEK SOFTCLIX LANCETS lancets Use as instructed 100 each 12  . aspirin 81 MG chewable tablet Chew 81 mg by mouth 2 (two) times a week. Pt takes on Monday and Friday.    Marland Kitchen atorvastatin (LIPITOR) 40 MG tablet Take 1 tablet (40 mg total) by mouth daily. 90 tablet 3  . collagenase (SANTYL) ointment Apply 1 application topically daily. 30 g 1  . DULoxetine (CYMBALTA) 60 MG capsule TAKE 1 CAPSULE BY MOUTH EVERY DAY 30 capsule 2  . feeding supplement, ENSURE ENLIVE, (ENSURE ENLIVE) LIQD Take 237 mLs by mouth 3 (three) times daily between meals. 90 Bottle 0  . gabapentin (NEURONTIN) 600 MG tablet Take 1 tablet (600 mg total) by mouth 3 (three) times daily. 90 tablet 0  . glucose blood test strip Use to test blood sugar three-four times daily. 400 each 3  . Insulin Glargine (LANTUS SOLOSTAR) 100 UNIT/ML Solostar Pen  Inject 20-25 units into skin at 10 pm daily. If blood sugar is over 200, increase by 5 units every 1 to 2 weeks. 5 pen PRN  . Insulin Syringe-Needle U-100 (INSULIN SYRINGE .5CC/31GX5/16") 31G X 5/16" 0.5 ML MISC USE AS DIRECTED WITH LANTUS 100 each 5  . ipratropium-albuterol (DUONEB) 0.5-2.5 (3) MG/3ML SOLN Take 3 mLs by nebulization every 6 (six) hours as needed. 360 mL 0  . metFORMIN (GLUCOPHAGE) 500 MG tablet Take 2 tablets (1,000 mg total) by mouth 2 (two) times daily with a meal. 120  tablet 4  . metoprolol succinate (TOPROL-XL) 25 MG 24 hr tablet Take 0.5 tablets (12.5 mg total) by mouth daily. 90 tablet 1  . polyethylene glycol (MIRALAX / GLYCOLAX) packet Take 17 g by mouth daily. 14 each 0  . tamsulosin (FLOMAX) 0.4 MG CAPS capsule Take 1 capsule (0.4 mg total) by mouth daily after breakfast. 30 capsule 0  . traMADol (ULTRAM) 50 MG tablet TAKE 1 TABLET BY MOUTH EVERY 6 HOURS as needed for pain 135 tablet 0   No current facility-administered medications on file prior to visit.      Past Medical History:  Diagnosis Date  . ANKLE SPRAIN 12/16/2006   Qualifier: Diagnosis of  By: Tawanna Cooler MD, Eugenio Hoes   . ANXIETY 10/25/2006  . CELLULITIS, LEG, RIGHT 09/08/2007   Qualifier: Diagnosis of  By: Tawanna Cooler MD, Eugenio Hoes   . Chronic pain disorder 11/30/2015  . COPD 10/25/2006  . 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 DIABETES MELLITUS, TYPE I 10/25/2006  . Diabetic polyneuropathy (HCC) 04/08/2007   Qualifier: Diagnosis of  By: Tawanna Cooler MD, Eugenio Hoes   . 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 Essential hypertension 10/25/2006   Qualifier: Diagnosis of  By: Rosette Reveal RN, Jorene Minors   . Generalized osteoarthritis of multiple sites 11/30/2015  . HYPERTENSION 10/25/2006  . 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 Low back pain with radiation 01/23/2016  . Polyneuropathy in diabetes(357.2) 04/08/2007  . Psoriasis   . TOBACCO ABUSE 07/08/2007  . VENOUS INSUFFICIENCY 11/25/2007   No Known Allergies  Social History   Socioeconomic History  . Marital status: Married    Spouse name: Not on file  . Number of children: Not on file  . Years of education: Not on file  . Highest education level: Not on file  Occupational History  . Occupation: Self Employed  Social Needs  . Financial resource strain: Not on file  . Food insecurity:    Worry: Not on file     Inability: Not on file  . Transportation needs:    Medical: Not on file    Non-medical: Not on file  Tobacco Use  . Smoking status: Current Every Day Smoker    Types: Pipe    Last attempt to quit: 05/29/2011    Years since quitting: 6.7  . Smokeless tobacco: Never Used  Substance and Sexual Activity  . Alcohol use: No  . Drug use: No  . Sexual activity: Not on file  Lifestyle  . Physical activity:    Days per week: Not on file    Minutes per session: Not on file  . Stress: Not on file  Relationships  . Social connections:    Talks on phone: Not on file    Gets together: Not on file    Attends  religious service: Not on file    Active member of club or organization: Not on file    Attends meetings of clubs or organizations: Not on file    Relationship status: Not on file  Other Topics Concern  . Not on file  Social History Narrative   Regular exercise-no    Vitals:   02/05/18 1521  BP: 116/68  Pulse: 85  Resp: 16  Temp: 98.6 F (37 C)  SpO2: 98%   Body mass index is 21.6 kg/m.   Physical Exam  Nursing note reviewed. Constitutional: He is oriented to person, place, and time. He appears well-developed and well-nourished. No distress.  HENT:  Head: Normocephalic and atraumatic.  Mouth/Throat: Oropharynx is clear and moist and mucous membranes are normal.  Eyes: Pupils are equal, round, and reactive to light. Conjunctivae are normal.  Cardiovascular: Normal rate and regular rhythm.  No murmur heard. Pulses:      Dorsalis pedis pulses are 2+ on the right side, and 2+ on the left side.  Respiratory: Effort normal and breath sounds normal. No respiratory distress.  GI: Soft. He exhibits no mass. There is no hepatomegaly. There is no tenderness.  Musculoskeletal: He exhibits edema (2+ LE edema,bilateral.).  Lymphadenopathy:    He has no cervical adenopathy.  Neurological: He is alert and oriented to person, place, and time. He has normal strength.  Unstable gait  assisted with a cane.  Skin: Skin is warm. No rash noted. No erythema.  Hyperpigmentation changes pretibial area,bilateral.No ulcers or erythema.  Psychiatric: He has a normal mood and affect.  Well groomed, good eye contact.    ASSESSMENT AND PLAN:  Mr. Karlito was seen today for tcm/hospital follow-up.  Diagnoses and all orders for this visit:  Diabetes mellitus type 2 with neurological manifestations (HCC)  Poorly controlled. Strongly recommend to improve eating habits,avoid long hours in between meals. Instructed about warning signs. Eye exam overdue. No changes in current management. F/U in 3 months.  Pruritus of skin  ? Related to dry skin. Continue current management.  -     triamcinolone ointment (KENALOG) 0.1 %; Apply 1 application topically 2 (two) times daily as needed (rash). Compounded as a 1:1 mixture with Eucerin.  Essential hypertension  BP adequate. No changes in current management. Low salt deit recommended.  AKI (acute kidney injury) (HCC)  Renal function back to normal, 01/17/18 e GFR > 60. Some side effects of chronic NSAID's use. Adequate hydration and better diabetes control.  Bilateral lower extremity edema Most likely related to vein disease. Other possible etiology discussed. For now I recommend elevation of lower extremities above his waist level a few times during the day. Would prefer to hold on diuretics for now.     [name]. [place], MD  [facility]. [place] office.

## 2018-02-05 NOTE — Assessment & Plan Note (Signed)
Most likely related to vein disease. Other possible etiology discussed. For now I recommend elevation of lower extremities above his waist level a few times during the day. Would prefer to hold on diuretics for now.

## 2018-02-05 NOTE — Progress Notes (Signed)
   Subjective:  72 year old male with PMHx of DM presenting today for follow up evaluation of an ulceration to the right heel. He states he is doing well overall. He states the wound looks smaller. He has been using Santyl daily as directed. He denies modifying factors. Patient is here for further evaluation and treatment.   Past Medical History:  Diagnosis Date  . ANKLE SPRAIN 12/16/2006   Qualifier: Diagnosis of  By: Tawanna Cooler MD, Eugenio Hoes   . ANXIETY 10/25/2006  . CELLULITIS, LEG, RIGHT 09/08/2007   Qualifier: Diagnosis of  By: Tawanna Cooler MD, Eugenio Hoes   . Chronic pain disorder 11/30/2015  . COPD 10/25/2006  . 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 DIABETES MELLITUS, TYPE I 10/25/2006  . Diabetic polyneuropathy (HCC) 04/08/2007   Qualifier: Diagnosis of  By: Tawanna Cooler MD, Eugenio Hoes   . 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 Essential hypertension 10/25/2006   Qualifier: Diagnosis of  By: Rosette Reveal RN, Jorene Minors   . Generalized osteoarthritis of multiple sites 11/30/2015  . HYPERTENSION 10/25/2006  . 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 Low back pain with radiation 01/23/2016  . Polyneuropathy in diabetes(357.2) 04/08/2007  . Psoriasis   . TOBACCO ABUSE 07/08/2007  . VENOUS INSUFFICIENCY 11/25/2007      Objective/Physical Exam General: The patient is alert and oriented x3 in no acute distress.  Dermatology:  Wound #1 noted to the right heel measuring 1.5 x 1.5 x 0.1 cm (LxWxD).   To the noted ulceration(s), there is no eschar. There is a moderate amount of slough, fibrin, and necrotic tissue noted. Granulation tissue and wound base is red. There is a minimal amount of serosanguineous drainage noted. There is no exposed bone muscle-tendon ligament or joint. There is no malodor. Periwound integrity is intact. Skin is warm, dry and supple bilateral lower  extremities.  Vascular: Palpable pedal pulses bilaterally. No edema or erythema noted. Capillary refill within normal limits.  Neurological: Epicritic and protective threshold diminished bilaterally.   Musculoskeletal Exam: Range of motion within normal limits to all pedal and ankle joints bilateral. Muscle strength 5/5 in all groups bilateral.   Assessment: #1 ulceration noted to the right heel secondary to diabetes mellitus #2 diabetes mellitus w/ peripheral neuropathy   Plan of Care:  #1 Patient was evaluated. #2 Medically necessary excisional debridement including subcutaneous tissue was performed using a tissue nipper and a chisel blade. Excisional debridement of all the necrotic nonviable tissue down to healthy bleeding viable tissue was performed with post-debridement measurements same as pre-. #3 The wound was cleansed and dry sterile dressing applied. #4 Continue using Santyl ointment daily with a dry sterile dressing.  #5 Continue wearing good shoe gear.  #6 Return to clinic in 4 weeks.    Felecia Shelling, DPM [facility]  Dr. Felecia Shelling, DPM    [address]                                        [place], [place] [address]                Office [phone]  Fax [phone]

## 2018-02-08 ENCOUNTER — Encounter: Payer: Self-pay | Admitting: Family Medicine

## 2018-02-11 ENCOUNTER — Other Ambulatory Visit: Payer: Self-pay | Admitting: Cardiology

## 2018-02-20 ENCOUNTER — Telehealth: Payer: Self-pay | Admitting: *Deleted

## 2018-02-20 MED ORDER — TRAMADOL HCL 50 MG PO TABS: 50.0000 mg | ORAL_TABLET | Freq: Four times a day (QID) | ORAL | 0 refills | Status: DC | PRN

## 2018-02-20 NOTE — Telephone Encounter (Signed)
An appt has been made for 02/27/18 with Dr Allena Katz. Mr Kenley needs a refill on his tramadol.  Refill sent to pharmacy. Mrs Rideaux notified.

## 2018-02-27 ENCOUNTER — Encounter: Payer: PPO | Attending: Physical Medicine & Rehabilitation | Admitting: Physical Medicine & Rehabilitation

## 2018-02-27 ENCOUNTER — Encounter: Payer: Self-pay | Admitting: Physical Medicine & Rehabilitation

## 2018-02-27 ENCOUNTER — Other Ambulatory Visit: Payer: Self-pay

## 2018-02-27 VITALS — BP 117/70 | HR 85 | Ht 72.0 in | Wt 159.0 lb

## 2018-02-27 DIAGNOSIS — I872 Venous insufficiency (chronic) (peripheral): Secondary | ICD-10-CM | POA: Insufficient documentation

## 2018-02-27 DIAGNOSIS — M545 Low back pain, unspecified: Secondary | ICD-10-CM

## 2018-02-27 DIAGNOSIS — F17290 Nicotine dependence, other tobacco product, uncomplicated: Secondary | ICD-10-CM | POA: Diagnosis not present

## 2018-02-27 DIAGNOSIS — E538 Deficiency of other specified B group vitamins: Secondary | ICD-10-CM | POA: Diagnosis not present

## 2018-02-27 DIAGNOSIS — F419 Anxiety disorder, unspecified: Secondary | ICD-10-CM | POA: Insufficient documentation

## 2018-02-27 DIAGNOSIS — G479 Sleep disorder, unspecified: Secondary | ICD-10-CM | POA: Diagnosis not present

## 2018-02-27 DIAGNOSIS — E1042 Type 1 diabetes mellitus with diabetic polyneuropathy: Secondary | ICD-10-CM | POA: Insufficient documentation

## 2018-02-27 DIAGNOSIS — Z79899 Other long term (current) drug therapy: Secondary | ICD-10-CM | POA: Diagnosis not present

## 2018-02-27 DIAGNOSIS — R269 Unspecified abnormalities of gait and mobility: Secondary | ICD-10-CM | POA: Diagnosis not present

## 2018-02-27 DIAGNOSIS — I10 Essential (primary) hypertension: Secondary | ICD-10-CM | POA: Diagnosis not present

## 2018-02-27 DIAGNOSIS — L409 Psoriasis, unspecified: Secondary | ICD-10-CM | POA: Diagnosis not present

## 2018-02-27 DIAGNOSIS — Z833 Family history of diabetes mellitus: Secondary | ICD-10-CM | POA: Insufficient documentation

## 2018-02-27 DIAGNOSIS — M1990 Unspecified osteoarthritis, unspecified site: Secondary | ICD-10-CM | POA: Insufficient documentation

## 2018-02-27 DIAGNOSIS — G8929 Other chronic pain: Secondary | ICD-10-CM

## 2018-02-27 DIAGNOSIS — E1142 Type 2 diabetes mellitus with diabetic polyneuropathy: Secondary | ICD-10-CM

## 2018-02-27 DIAGNOSIS — G894 Chronic pain syndrome: Secondary | ICD-10-CM

## 2018-02-27 DIAGNOSIS — J449 Chronic obstructive pulmonary disease, unspecified: Secondary | ICD-10-CM | POA: Diagnosis not present

## 2018-02-27 NOTE — Progress Notes (Signed)
Subjective:    Patient ID: Jeremy Gill, male    DOB: Jun 04, 1945, 72 y.o.   MRN: [id number]  HPI  Male with pmh of diabetic polyneuropathy, OA, chronic pain, tobacco abuse, HTN, COPD presents follow up chronic pain >low back, midline.   Initially, pt stated it started 11/26/15 denies inciting event.  Getting progressively worse.  Pain meds improve the pain. No pain meds alleviate the pain.  Mostly dull pain. Non-radiating.  Constant.  He saw Ortho spine for evaluation of surgery.  He was referred to PT, which he completed.  It helped a little.  He exercises daily. He tried hydrocodone for 12 years, but it was stopped by her PCP.  Denies associated weakness and numbness.  He has fallen twice in the year prior to eval because he was not using his cane.  Pain limits pt from doing "normal things".    Last clinic visit 11/01/17.  Wife supplements history.  Since that time, pt went to the ED for metabolic encephalopathy and AKI, notes reviewed. He continues to take Gabapentin, Cymbalta, Tramadol, Baclofen, Diclofenac.  He is not using Lidoderm patch. He had a fall around the time he was in the hospital.  Patient has lost weight and wife notes patient has been non-complaint. His CBGs remain uncontrolled.    Pain Inventory Average Pain 8 Pain Right Now 8 My pain is constant, sharp, tingling and aching  In the last 24 hours, has pain interfered with the following? General activity 8 Relation with others 5 Enjoyment of life 8 What TIME of day is your pain at its worst? morning and daytime Sleep (in general) Fair  Pain is worse with: walking, bending, sitting, inactivity and standing Pain improves with: medication Relief from Meds: 5  Mobility use a cane ability to climb steps?  yes do you drive?  yes  Function retired  Neuro/Psych No problems in this area trouble walking  Prior Studies Any changes since last visit?  no   Physicians involved in care Any changes since last visit?   no   Family History  Problem Relation Age of Onset  . Cancer Mother        Unknown   . Diabetes Sister    Social History   Socioeconomic History  . Marital status: Married    Spouse name: Not on file  . Number of children: Not on file  . Years of education: Not on file  . Highest education level: Not on file  Occupational History  . Occupation: Self Employed  Social Needs  . Financial resource strain: Not on file  . Food insecurity:    Worry: Not on file    Inability: Not on file  . Transportation needs:    Medical: Not on file    Non-medical: Not on file  Tobacco Use  . Smoking status: Current Every Day Smoker    Types: Pipe    Last attempt to quit: 05/29/2011    Years since quitting: 6.7  . Smokeless tobacco: Never Used  Substance and Sexual Activity  . Alcohol use: No  . Drug use: No  . Sexual activity: Not on file  Lifestyle  . Physical activity:    Days per week: Not on file    Minutes per session: Not on file  . Stress: Not on file  Relationships  . Social connections:    Talks on phone: Not on file    Gets together: Not on file    Attends religious service:  Not on file    Active member of club or organization: Not on file    Attends meetings of clubs or organizations: Not on file    Relationship status: Not on file  Other Topics Concern  . Not on file  Social History Narrative   Regular exercise-no   Past Surgical History:  Procedure Laterality Date  . TESTICLE REMOVAL     R testicle   Past Medical History:  Diagnosis Date  . ANKLE SPRAIN 12/16/2006   Qualifier: Diagnosis of  By: Tawanna Cooler MD, Eugenio Hoes   . ANXIETY 10/25/2006  . CELLULITIS, LEG, RIGHT 09/08/2007   Qualifier: Diagnosis of  By: Tawanna Cooler MD, Eugenio Hoes   . Chronic pain disorder 11/30/2015  . COPD 10/25/2006  . 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 DIABETES MELLITUS, TYPE I 10/25/2006  . Diabetic  polyneuropathy (HCC) 04/08/2007   Qualifier: Diagnosis of  By: Tawanna Cooler MD, Eugenio Hoes   . 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 Essential hypertension 10/25/2006   Qualifier: Diagnosis of  By: Rosette Reveal RN, Jorene Minors   . Generalized osteoarthritis of multiple sites 11/30/2015  . HYPERTENSION 10/25/2006  . 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 Low back pain with radiation 01/23/2016  . Polyneuropathy in diabetes(357.2) 04/08/2007  . Psoriasis   . TOBACCO ABUSE 07/08/2007  . VENOUS INSUFFICIENCY 11/25/2007   BP 117/70   Pulse 85   Ht 6' (1.829 m)   Wt 159 lb (72.1 kg)   SpO2 95%   BMI 21.56 kg/m   Opioid Risk Score:   Fall Risk Score:  `1  Depression screen PHQ 2/9  Depression screen [facility] 2/9 02/27/2018 11/01/2017 12/13/2016 10/30/2016 02/16/2016 02/08/2016 01/19/2014  Decreased Interest 0 1 0 (No Data) 2 0 0  Down, Depressed, Hopeless 0 1 0 0 1 0 0  PHQ - 2 Score 0 2 0 0 3 0 0  Altered sleeping - - - - 1 - -  Tired, decreased energy - - - - 2 - -  Change in appetite - - - - 1 - -  Feeling bad or failure about yourself  - - - - 0 - -  Trouble concentrating - - - - 0 - -  Moving slowly or fidgety/restless - - - - 0 - -  Suicidal thoughts - - - - 0 - -  PHQ-9 Score - - - - 7 - -  Difficult doing work/chores - - - - Somewhat difficult - -    Review of Systems  Constitutional: Negative.  Negative for chills and fever.  HENT: Negative.   Eyes: Negative.   Respiratory: Negative.   Cardiovascular: Negative.   Gastrointestinal: Negative.   Endocrine:       High blood sugar    Genitourinary: Negative.   Musculoskeletal: Negative.        Spasms   Skin: Negative.   Allergic/Immunologic: Negative.   Neurological: Negative.  Negative for dizziness.       Tingling    Hematological: Negative.   Psychiatric/Behavioral: Negative.   All other systems reviewed and are negative.     Objective:   Physical Exam Gen: NAD. Vital signs  reviewed HENT: Normocephalic, Atraumatic Eyes: EOMI. No discharge.  Cardio: RRR. No JVD.  Pulm: B/l clear to auscultation.  Effort normal. Abd: Soft, BS+ MSK:  Gait antalgic with cane.  Mild TTP L>R gluteal muscles.    LE edema.  Neuro:    Strength  4+/5 in all LE myotomes  Skin: Warm and Dry.  Intact. Psych: Normal mood and affect.     Assessment & Plan:  72 y/o male with pmh of diabetic polyneuropathy, OA, chronic pain, tobacco abuse, HTN, COPD presents for follow up of chronic pain >low back, midline.   1.  Chronic mechanical low back pain  Xray L-spine reviewed, mild degenerative changes.   MRI 10/15/17 reviewed some L5-S1 disc space narrowing, otherwise relatively unremarkable for acute changes.  Syrinx noted, unclear chronicity.  Tylenol, TENS ineffective for pt  Mobic 15mg  d/ced due to lack of efficacy  Naproxen changed due to preference for TID medication  Robaxin changed due to cost  Pt completed PT, cont HEP.  Pt to join gym. Will order PT again.    Cont ice  Cont Gabapentin to 600 TID  Cont Cymbalta to 60mg   Cont Tramadol 50 to 4/day (educated and discussed signs/symptoms serotonin syndrome and risk of seizures)  Cont Baclofen 10 TID due to cost  Cont Diclofenac 50 TID - pt believes he receives good benefit from this medication and would like to continue. Discussed need for hydration based on last lab values with NSAIDs.  Cont Lidoderm PRN  Encouraged physical activity  Patient believes previous pain regiment works well for him and would like to return  2. Diabetic polyneuropathy  Cont Gabapentin 600 TID  3. Gait abnormality  Cont cane for safety, pt states good support with current single point cane  Encouraged compliance with diabetic footware  Therapy ordered  4. Sleep disturbance  Relatively improved  5. Vitamin B12 deficiency  Cont supplementation with daily  Will order Vit B12

## 2018-03-03 ENCOUNTER — Ambulatory Visit: Payer: PPO | Admitting: Podiatry

## 2018-03-04 DIAGNOSIS — G894 Chronic pain syndrome: Secondary | ICD-10-CM | POA: Diagnosis not present

## 2018-03-04 DIAGNOSIS — R269 Unspecified abnormalities of gait and mobility: Secondary | ICD-10-CM | POA: Diagnosis not present

## 2018-03-04 DIAGNOSIS — E538 Deficiency of other specified B group vitamins: Secondary | ICD-10-CM | POA: Diagnosis not present

## 2018-03-05 ENCOUNTER — Other Ambulatory Visit: Payer: Self-pay | Admitting: Family Medicine

## 2018-03-05 ENCOUNTER — Other Ambulatory Visit: Payer: Self-pay

## 2018-03-05 ENCOUNTER — Other Ambulatory Visit: Payer: Self-pay | Admitting: Physical Medicine & Rehabilitation

## 2018-03-05 DIAGNOSIS — E1149 Type 2 diabetes mellitus with other diabetic neurological complication: Secondary | ICD-10-CM

## 2018-03-05 LAB — VITAMIN B12

## 2018-03-05 LAB — VITAMIN D 25 HYDROXY (VIT D DEFICIENCY, FRACTURES): VIT D 25 HYDROXY: 23.5 ng/mL — AB (ref 30.0–100.0)

## 2018-03-05 NOTE — Patient Outreach (Signed)
[facility]) Care Management  03/05/2018  JAXSUN CIAMPI 1946-05-19 [id number]    1st outreach attempt to the patient for initial assessment. No answer. HIPAA compliant voicemail left with contact information.  Plan: RN Health Coach will make another outreach attempt to the patient within thirty days.  Juanell Fairly RN, BSN, [facility] RN Health Coach Disease Management [facility] Dial:  [phone]  Fax: [phone]

## 2018-03-10 ENCOUNTER — Other Ambulatory Visit: Payer: Self-pay | Admitting: Physical Medicine & Rehabilitation

## 2018-03-12 ENCOUNTER — Ambulatory Visit: Payer: PPO | Admitting: Podiatry

## 2018-03-13 ENCOUNTER — Encounter (HOSPITAL_COMMUNITY): Payer: Self-pay | Admitting: *Deleted

## 2018-03-13 ENCOUNTER — Emergency Department (HOSPITAL_COMMUNITY): Payer: PPO

## 2018-03-13 ENCOUNTER — Inpatient Hospital Stay (HOSPITAL_COMMUNITY)
Admission: EM | Admit: 2018-03-13 | Discharge: 2018-03-18 | DRG: 871 | Disposition: A | Discharge status: Skilled Nursing Facility | Payer: PPO | Attending: Internal Medicine | Admitting: Internal Medicine

## 2018-03-13 ENCOUNTER — Ambulatory Visit: Payer: Self-pay

## 2018-03-13 DIAGNOSIS — L89616 Pressure-induced deep tissue damage of right heel: Secondary | ICD-10-CM | POA: Diagnosis not present

## 2018-03-13 DIAGNOSIS — G9340 Encephalopathy, unspecified: Secondary | ICD-10-CM

## 2018-03-13 DIAGNOSIS — Y95 Nosocomial condition: Secondary | ICD-10-CM | POA: Diagnosis present

## 2018-03-13 DIAGNOSIS — I214 Non-ST elevation (NSTEMI) myocardial infarction: Secondary | ICD-10-CM | POA: Diagnosis not present

## 2018-03-13 DIAGNOSIS — D529 Folate deficiency anemia, unspecified: Secondary | ICD-10-CM | POA: Diagnosis not present

## 2018-03-13 DIAGNOSIS — G9341 Metabolic encephalopathy: Secondary | ICD-10-CM | POA: Diagnosis not present

## 2018-03-13 DIAGNOSIS — Z79899 Other long term (current) drug therapy: Secondary | ICD-10-CM

## 2018-03-13 DIAGNOSIS — L89016 Pressure-induced deep tissue damage of right elbow: Secondary | ICD-10-CM | POA: Diagnosis present

## 2018-03-13 DIAGNOSIS — R1312 Dysphagia, oropharyngeal phase: Secondary | ICD-10-CM | POA: Diagnosis present

## 2018-03-13 DIAGNOSIS — E1069 Type 1 diabetes mellitus with other specified complication: Secondary | ICD-10-CM | POA: Diagnosis not present

## 2018-03-13 DIAGNOSIS — B370 Candidal stomatitis: Secondary | ICD-10-CM | POA: Diagnosis not present

## 2018-03-13 DIAGNOSIS — R6520 Severe sepsis without septic shock: Secondary | ICD-10-CM | POA: Diagnosis not present

## 2018-03-13 DIAGNOSIS — J449 Chronic obstructive pulmonary disease, unspecified: Secondary | ICD-10-CM | POA: Diagnosis not present

## 2018-03-13 DIAGNOSIS — R079 Chest pain, unspecified: Secondary | ICD-10-CM | POA: Diagnosis not present

## 2018-03-13 DIAGNOSIS — E1022 Type 1 diabetes mellitus with diabetic chronic kidney disease: Secondary | ICD-10-CM | POA: Diagnosis not present

## 2018-03-13 DIAGNOSIS — D649 Anemia, unspecified: Secondary | ICD-10-CM | POA: Diagnosis not present

## 2018-03-13 DIAGNOSIS — I872 Venous insufficiency (chronic) (peripheral): Secondary | ICD-10-CM | POA: Diagnosis not present

## 2018-03-13 DIAGNOSIS — E538 Deficiency of other specified B group vitamins: Secondary | ICD-10-CM | POA: Diagnosis present

## 2018-03-13 DIAGNOSIS — Z794 Long term (current) use of insulin: Secondary | ICD-10-CM

## 2018-03-13 DIAGNOSIS — J9601 Acute respiratory failure with hypoxia: Secondary | ICD-10-CM | POA: Diagnosis present

## 2018-03-13 DIAGNOSIS — L89153 Pressure ulcer of sacral region, stage 3: Secondary | ICD-10-CM | POA: Diagnosis not present

## 2018-03-13 DIAGNOSIS — A419 Sepsis, unspecified organism: Secondary | ICD-10-CM | POA: Diagnosis not present

## 2018-03-13 DIAGNOSIS — R9431 Abnormal electrocardiogram [ECG] [EKG]: Secondary | ICD-10-CM | POA: Diagnosis not present

## 2018-03-13 DIAGNOSIS — E1049 Type 1 diabetes mellitus with other diabetic neurological complication: Secondary | ICD-10-CM | POA: Diagnosis present

## 2018-03-13 DIAGNOSIS — D631 Anemia in chronic kidney disease: Secondary | ICD-10-CM | POA: Diagnosis present

## 2018-03-13 DIAGNOSIS — E1149 Type 2 diabetes mellitus with other diabetic neurological complication: Secondary | ICD-10-CM | POA: Diagnosis not present

## 2018-03-13 DIAGNOSIS — R64 Cachexia: Secondary | ICD-10-CM | POA: Diagnosis present

## 2018-03-13 DIAGNOSIS — I10 Essential (primary) hypertension: Secondary | ICD-10-CM

## 2018-03-13 DIAGNOSIS — R338 Other retention of urine: Secondary | ICD-10-CM | POA: Diagnosis not present

## 2018-03-13 DIAGNOSIS — Z7982 Long term (current) use of aspirin: Secondary | ICD-10-CM

## 2018-03-13 DIAGNOSIS — N183 Chronic kidney disease, stage 3 unspecified: Secondary | ICD-10-CM | POA: Diagnosis present

## 2018-03-13 DIAGNOSIS — R000 Tachycardia, unspecified: Secondary | ICD-10-CM | POA: Diagnosis not present

## 2018-03-13 DIAGNOSIS — M6281 Muscle weakness (generalized): Secondary | ICD-10-CM | POA: Diagnosis not present

## 2018-03-13 DIAGNOSIS — E1142 Type 2 diabetes mellitus with diabetic polyneuropathy: Secondary | ICD-10-CM | POA: Diagnosis present

## 2018-03-13 DIAGNOSIS — R404 Transient alteration of awareness: Secondary | ICD-10-CM | POA: Diagnosis not present

## 2018-03-13 DIAGNOSIS — E440 Moderate protein-calorie malnutrition: Secondary | ICD-10-CM

## 2018-03-13 DIAGNOSIS — J440 Chronic obstructive pulmonary disease with acute lower respiratory infection: Secondary | ICD-10-CM | POA: Diagnosis present

## 2018-03-13 DIAGNOSIS — R1310 Dysphagia, unspecified: Secondary | ICD-10-CM | POA: Diagnosis not present

## 2018-03-13 DIAGNOSIS — E785 Hyperlipidemia, unspecified: Secondary | ICD-10-CM | POA: Diagnosis not present

## 2018-03-13 DIAGNOSIS — M159 Polyosteoarthritis, unspecified: Secondary | ICD-10-CM | POA: Diagnosis present

## 2018-03-13 DIAGNOSIS — L409 Psoriasis, unspecified: Secondary | ICD-10-CM | POA: Diagnosis present

## 2018-03-13 DIAGNOSIS — I129 Hypertensive chronic kidney disease with stage 1 through stage 4 chronic kidney disease, or unspecified chronic kidney disease: Secondary | ICD-10-CM | POA: Diagnosis not present

## 2018-03-13 DIAGNOSIS — E1159 Type 2 diabetes mellitus with other circulatory complications: Secondary | ICD-10-CM | POA: Diagnosis present

## 2018-03-13 DIAGNOSIS — E1051 Type 1 diabetes mellitus with diabetic peripheral angiopathy without gangrene: Secondary | ICD-10-CM | POA: Diagnosis present

## 2018-03-13 DIAGNOSIS — F17200 Nicotine dependence, unspecified, uncomplicated: Secondary | ICD-10-CM | POA: Diagnosis present

## 2018-03-13 DIAGNOSIS — I152 Hypertension secondary to endocrine disorders: Secondary | ICD-10-CM | POA: Diagnosis present

## 2018-03-13 DIAGNOSIS — E43 Unspecified severe protein-calorie malnutrition: Secondary | ICD-10-CM | POA: Diagnosis not present

## 2018-03-13 DIAGNOSIS — F419 Anxiety disorder, unspecified: Secondary | ICD-10-CM | POA: Diagnosis not present

## 2018-03-13 DIAGNOSIS — R0902 Hypoxemia: Secondary | ICD-10-CM | POA: Diagnosis not present

## 2018-03-13 DIAGNOSIS — R7989 Other specified abnormal findings of blood chemistry: Secondary | ICD-10-CM | POA: Diagnosis present

## 2018-03-13 DIAGNOSIS — J181 Lobar pneumonia, unspecified organism: Secondary | ICD-10-CM | POA: Diagnosis present

## 2018-03-13 DIAGNOSIS — R55 Syncope and collapse: Secondary | ICD-10-CM | POA: Diagnosis not present

## 2018-03-13 DIAGNOSIS — E1042 Type 1 diabetes mellitus with diabetic polyneuropathy: Secondary | ICD-10-CM | POA: Diagnosis present

## 2018-03-13 DIAGNOSIS — L89156 Pressure-induced deep tissue damage of sacral region: Secondary | ICD-10-CM | POA: Diagnosis present

## 2018-03-13 DIAGNOSIS — F0390 Unspecified dementia without behavioral disturbance: Secondary | ICD-10-CM | POA: Diagnosis not present

## 2018-03-13 DIAGNOSIS — I69828 Other speech and language deficits following other cerebrovascular disease: Secondary | ICD-10-CM | POA: Diagnosis not present

## 2018-03-13 DIAGNOSIS — R531 Weakness: Secondary | ICD-10-CM | POA: Diagnosis not present

## 2018-03-13 DIAGNOSIS — N179 Acute kidney failure, unspecified: Secondary | ICD-10-CM | POA: Diagnosis not present

## 2018-03-13 DIAGNOSIS — R778 Other specified abnormalities of plasma proteins: Secondary | ICD-10-CM | POA: Diagnosis present

## 2018-03-13 DIAGNOSIS — E876 Hypokalemia: Secondary | ICD-10-CM | POA: Diagnosis not present

## 2018-03-13 DIAGNOSIS — L89150 Pressure ulcer of sacral region, unstageable: Secondary | ICD-10-CM | POA: Diagnosis present

## 2018-03-13 DIAGNOSIS — Z833 Family history of diabetes mellitus: Secondary | ICD-10-CM

## 2018-03-13 DIAGNOSIS — J189 Pneumonia, unspecified organism: Secondary | ICD-10-CM | POA: Diagnosis present

## 2018-03-13 DIAGNOSIS — E7849 Other hyperlipidemia: Secondary | ICD-10-CM | POA: Diagnosis not present

## 2018-03-13 DIAGNOSIS — G8929 Other chronic pain: Secondary | ICD-10-CM | POA: Diagnosis present

## 2018-03-13 DIAGNOSIS — E1165 Type 2 diabetes mellitus with hyperglycemia: Secondary | ICD-10-CM | POA: Diagnosis not present

## 2018-03-13 DIAGNOSIS — R4182 Altered mental status, unspecified: Secondary | ICD-10-CM | POA: Diagnosis not present

## 2018-03-13 DIAGNOSIS — L89619 Pressure ulcer of right heel, unspecified stage: Secondary | ICD-10-CM | POA: Diagnosis not present

## 2018-03-13 DIAGNOSIS — Z743 Need for continuous supervision: Secondary | ICD-10-CM | POA: Diagnosis not present

## 2018-03-13 DIAGNOSIS — N401 Enlarged prostate with lower urinary tract symptoms: Secondary | ICD-10-CM | POA: Diagnosis present

## 2018-03-13 DIAGNOSIS — Z7401 Bed confinement status: Secondary | ICD-10-CM | POA: Diagnosis not present

## 2018-03-13 DIAGNOSIS — R279 Unspecified lack of coordination: Secondary | ICD-10-CM | POA: Diagnosis not present

## 2018-03-13 DIAGNOSIS — R2681 Unsteadiness on feet: Secondary | ICD-10-CM | POA: Diagnosis not present

## 2018-03-13 DIAGNOSIS — E1169 Type 2 diabetes mellitus with other specified complication: Secondary | ICD-10-CM | POA: Diagnosis present

## 2018-03-13 LAB — OCCULT BLOOD X 1 CARD TO LAB, STOOL: Fecal Occult Bld: NEGATIVE

## 2018-03-13 LAB — MRSA PCR SCREENING: MRSA by PCR: NEGATIVE

## 2018-03-13 LAB — CBC WITH DIFFERENTIAL/PLATELET
Abs Immature Granulocytes: 0.12 10*3/uL — ABNORMAL HIGH (ref 0.00–0.07)
BASOS ABS: 0.1 10*3/uL (ref 0.0–0.1)
Basophils Relative: 1 %
EOS ABS: 0.3 10*3/uL (ref 0.0–0.5)
Eosinophils Relative: 2 %
HEMATOCRIT: 32.6 % — AB (ref 39.0–52.0)
Hemoglobin: 10.5 g/dL — ABNORMAL LOW (ref 13.0–17.0)
IMMATURE GRANULOCYTES: 1 %
LYMPHS ABS: 1.2 10*3/uL (ref 0.7–4.0)
LYMPHS PCT: 9 %
MCH: 30.5 pg (ref 26.0–34.0)
MCHC: 32.2 g/dL (ref 30.0–36.0)
MCV: 94.8 fL (ref 80.0–100.0)
MONOS PCT: 5 %
Monocytes Absolute: 0.7 10*3/uL (ref 0.1–1.0)
NEUTROS PCT: 82 %
NRBC: 0 % (ref 0.0–0.2)
Neutro Abs: 10.5 10*3/uL — ABNORMAL HIGH (ref 1.7–7.7)
Platelets: 263 10*3/uL (ref 150–400)
RBC: 3.44 MIL/uL — ABNORMAL LOW (ref 4.22–5.81)
RDW: 13.8 % (ref 11.5–15.5)
WBC: 12.8 10*3/uL — ABNORMAL HIGH (ref 4.0–10.5)

## 2018-03-13 LAB — I-STAT TROPONIN, ED: Troponin i, poc: 0.83 ng/mL (ref 0.00–0.08)

## 2018-03-13 LAB — COMPREHENSIVE METABOLIC PANEL
ALBUMIN: 2.8 g/dL — AB (ref 3.5–5.0)
ALT: 16 U/L (ref 0–44)
AST: 25 U/L (ref 15–41)
Alkaline Phosphatase: 43 U/L (ref 38–126)
Anion gap: 13 (ref 5–15)
BILIRUBIN TOTAL: 1.2 mg/dL (ref 0.3–1.2)
BUN: 52 mg/dL — AB (ref 8–23)
CHLORIDE: 104 mmol/L (ref 98–111)
CO2: 25 mmol/L (ref 22–32)
CREATININE: 1.77 mg/dL — AB (ref 0.61–1.24)
Calcium: 8.7 mg/dL — ABNORMAL LOW (ref 8.9–10.3)
GFR calc Af Amer: 42 mL/min — ABNORMAL LOW (ref >60)
GFR, EST NON AFRICAN AMERICAN: 37 mL/min — AB (ref >60)
GLUCOSE: 218 mg/dL — AB (ref 70–99)
Potassium: 5 mmol/L (ref 3.5–5.1)
Sodium: 142 mmol/L (ref 135–145)
TOTAL PROTEIN: 6.7 g/dL (ref 6.5–8.1)

## 2018-03-13 LAB — URINALYSIS, ROUTINE W REFLEX MICROSCOPIC
BILIRUBIN URINE: NEGATIVE
HGB URINE DIPSTICK: NEGATIVE
KETONES UR: NEGATIVE mg/dL
LEUKOCYTES UA: NEGATIVE
NITRITE: NEGATIVE
Protein, ur: NEGATIVE mg/dL
Specific Gravity, Urine: 1.014 (ref 1.005–1.030)
pH: 5 (ref 5.0–8.0)

## 2018-03-13 LAB — PROTIME-INR
INR: 1.17
PROTHROMBIN TIME: 14.8 s (ref 11.4–15.2)

## 2018-03-13 LAB — GLUCOSE, CAPILLARY
GLUCOSE-CAPILLARY: 170 mg/dL — AB (ref 70–99)
GLUCOSE-CAPILLARY: 127 mg/dL — AB (ref 70–99)

## 2018-03-13 LAB — LACTIC ACID, PLASMA: Lactic Acid, Venous: 2.3 mmol/L (ref 0.5–1.9)

## 2018-03-13 LAB — I-STAT CG4 LACTIC ACID, ED: LACTIC ACID, VENOUS: 3.92 mmol/L — AB (ref 0.5–1.9)

## 2018-03-13 LAB — CK: Total CK: 94 U/L (ref 49–397)

## 2018-03-13 LAB — TROPONIN I
Troponin I: 1.37 ng/mL (ref <0.03)
Troponin I: 1.64 ng/mL (ref <0.03)

## 2018-03-13 MED ORDER — METOPROLOL SUCCINATE ER 25 MG PO TB24
12.5000 mg | ORAL_TABLET | Freq: Every day | ORAL | Status: DC
  Filled 2018-03-13: qty 1

## 2018-03-13 MED ORDER — DULOXETINE HCL 60 MG PO CPEP
60.0000 mg | ORAL_CAPSULE | Freq: Every day | ORAL | Status: DC
  Administered 2018-03-15 – 2018-03-18 (×3): 60 mg via ORAL
  Filled 2018-03-13: qty 2
  Filled 2018-03-13 (×3): qty 1
  Filled 2018-03-13: qty 2
  Filled 2018-03-13: qty 1

## 2018-03-13 MED ORDER — VANCOMYCIN HCL IN DEXTROSE 750-5 MG/150ML-% IV SOLN
750.0000 mg | INTRAVENOUS | Status: DC
  Filled 2018-03-13: qty 150

## 2018-03-13 MED ORDER — SODIUM CHLORIDE 0.9 % IV SOLN
1.0000 g | Freq: Two times a day (BID) | INTRAVENOUS | Status: DC
  Administered 2018-03-14: 1 g via INTRAVENOUS
  Filled 2018-03-13 (×2): qty 1

## 2018-03-13 MED ORDER — TAMSULOSIN HCL 0.4 MG PO CAPS
0.4000 mg | ORAL_CAPSULE | Freq: Every day | ORAL | Status: DC
  Administered 2018-03-15 – 2018-03-18 (×4): 0.4 mg via ORAL
  Filled 2018-03-13 (×6): qty 1

## 2018-03-13 MED ORDER — SODIUM CHLORIDE 0.9 % IV SOLN
INTRAVENOUS | Status: DC
  Administered 2018-03-13 – 2018-03-14 (×2): via INTRAVENOUS

## 2018-03-13 MED ORDER — VANCOMYCIN HCL IN DEXTROSE 1-5 GM/200ML-% IV SOLN
1000.0000 mg | Freq: Once | INTRAVENOUS | Status: AC
  Administered 2018-03-13: 1000 mg via INTRAVENOUS
  Filled 2018-03-13: qty 200

## 2018-03-13 MED ORDER — SODIUM CHLORIDE 0.9 % IV SOLN
2.0000 g | Freq: Once | INTRAVENOUS | Status: AC
  Administered 2018-03-13: 2 g via INTRAVENOUS
  Filled 2018-03-13: qty 2

## 2018-03-13 MED ORDER — ATORVASTATIN CALCIUM 40 MG PO TABS
40.0000 mg | ORAL_TABLET | Freq: Every day | ORAL | Status: DC
  Administered 2018-03-13 – 2018-03-18 (×6): 40 mg via ORAL
  Filled 2018-03-13 (×8): qty 1

## 2018-03-13 MED ORDER — ENOXAPARIN SODIUM 40 MG/0.4ML ~~LOC~~ SOLN
40.0000 mg | SUBCUTANEOUS | Status: DC
  Administered 2018-03-13: 40 mg via SUBCUTANEOUS
  Filled 2018-03-13: qty 0.4

## 2018-03-13 MED ORDER — GABAPENTIN 300 MG PO CAPS
300.0000 mg | ORAL_CAPSULE | Freq: Three times a day (TID) | ORAL | Status: DC
  Administered 2018-03-14 – 2018-03-18 (×12): 300 mg via ORAL
  Filled 2018-03-13 (×16): qty 1

## 2018-03-13 MED ORDER — IPRATROPIUM-ALBUTEROL 0.5-2.5 (3) MG/3ML IN SOLN: 3.0000 mL | Freq: Four times a day (QID) | RESPIRATORY_TRACT | Status: DC | PRN

## 2018-03-13 MED ORDER — POLYETHYLENE GLYCOL 3350 17 G PO PACK
17.0000 g | PACK | Freq: Every day | ORAL | Status: DC
  Administered 2018-03-13 – 2018-03-18 (×6): 17 g via ORAL
  Filled 2018-03-13 (×6): qty 1

## 2018-03-13 MED ORDER — INSULIN GLARGINE 100 UNIT/ML ~~LOC~~ SOLN
10.0000 [IU] | Freq: Every day | SUBCUTANEOUS | Status: DC
  Administered 2018-03-13: 10 [IU] via SUBCUTANEOUS
  Filled 2018-03-13: qty 0.1

## 2018-03-13 MED ORDER — TRAMADOL HCL 50 MG PO TABS
50.0000 mg | ORAL_TABLET | Freq: Four times a day (QID) | ORAL | Status: DC | PRN
  Administered 2018-03-13 – 2018-03-18 (×5): 50 mg via ORAL
  Filled 2018-03-13 (×5): qty 1

## 2018-03-13 MED ORDER — ENSURE ENLIVE PO LIQD
1.0000 | Freq: Three times a day (TID) | ORAL | Status: DC
  Administered 2018-03-15 – 2018-03-18 (×7): 237 mL via ORAL

## 2018-03-13 MED ORDER — SODIUM CHLORIDE 0.9 % IV BOLUS
1000.0000 mL | Freq: Once | INTRAVENOUS | Status: AC
  Administered 2018-03-13: 1000 mL via INTRAVENOUS

## 2018-03-13 MED ORDER — INSULIN ASPART 100 UNIT/ML ~~LOC~~ SOLN
0.0000 [IU] | Freq: Three times a day (TID) | SUBCUTANEOUS | Status: DC
  Administered 2018-03-13: 2 [IU] via SUBCUTANEOUS
  Administered 2018-03-14: 3 [IU] via SUBCUTANEOUS
  Administered 2018-03-15 (×3): 5 [IU] via SUBCUTANEOUS

## 2018-03-13 NOTE — Progress Notes (Signed)
Pharmacy Antibiotic Note  Jeremy Gill is a 72 y.o. male admitted on 03/13/2018 with pneumonia.  Pharmacy has been consulted for vanc/cefepime dosing.  Plan: 1) Vancomycin 1g x 1 then start 750mg  IV q24 - goal AUC 400-500 2) Cefepime 2g x 1 then 1g IV q12  Height: 6' (182.9 cm) Weight: 150 lb (68 kg) IBW/kg (Calculated) : 77.6  Temp (24hrs), Avg:98.3 F (36.8 C), Min:97.8 F (36.6 C), Max:98.8 F (37.1 C)  Recent Labs  Lab 03/13/18 1314 03/13/18 1317  WBC  --  12.8*  CREATININE  --  1.77*  LATICACIDVEN 3.92*  --     Estimated Creatinine Clearance: 36.3 mL/min (A) (by C-G formula based on SCr of 1.77 mg/dL (H)).    No Known Allergies   Thank you for allowing pharmacy to be a part of this patient's care.  Berkley Harvey 03/13/2018 2:48 PM

## 2018-03-13 NOTE — ED Notes (Signed)
RN notified that patient has critical Troponin value of 0.83, will notify Ed provider Jeraldine Loots

## 2018-03-13 NOTE — Progress Notes (Signed)
A consult was received from an ED physician for vanc/cefepime per pharmacy dosing.  The patient's profile has been reviewed for ht/wt/allergies/indication/available labs.   A one time order has been placed for vanc 1g and cefepime 2g.  Further antibiotics/pharmacy consults should be ordered by admitting physician if indicated.                       Thank you, Berkley Harvey 03/13/2018  2:26 PM

## 2018-03-13 NOTE — ED Notes (Signed)
Patient transported to CT 

## 2018-03-13 NOTE — ED Notes (Signed)
RN notified that patient has critical lactic acid value of 3.92  Will notify Ed provider Jeraldine Loots

## 2018-03-13 NOTE — ED Notes (Signed)
Patient transported to X-ray 

## 2018-03-13 NOTE — H&P (Signed)
History and Physical    Jeremy Gill:[id number] DOB: 1945-07-10 DOA: 03/13/2018  I have briefly reviewed the patient's prior medical records in [facility]  PCP: [place], [name], MD  Patient coming from: home  Chief Complaint: AMS  HPI: Jeremy Gill is a 72 y.o. male with medical history significant of DM with neuropathy, HTN, HLD, BPH who is being brought to the hospital by family due to confusion.  Patient is alert however quite confused, fidgety, and cannot contribute much to the story.  I have discussed with the patient's wife who is at bedside.  She tells me that over the past couple weeks, he has been slightly more confused than his baseline.  She suspects he may have a degree of underlying dementia.  He was hospitalized in August of this year with acute encephalopathy, and improved however still at times even at home when he is at baseline he appears somewhat confused.  She also reports that over the last 2 weeks he has been having a reversal of sleep wake pattern, staying up all night and sleeping during the day.  She denies patient having a fever/chills/feeling hot.  There are no reported abdominal pain chest pain, shortness of breath, nausea vomiting or diarrhea.  He eats "fair" per wife.  He has been complaining of increased lower back pain, he has had this pain for years however over the last month he states that his home tramadol is no longer working.  He continues to smoke a pipe and has been resistant to quitting.  Wife reports that he has a chronic cough but does not feel like it has gotten worse.  ED Course: in the ED patient is found to be septic, tachycardic, tachypneic, with increased WBC. He is confused.Labs show renal failure with Cr 1.77 from prior normal values.Troponin is 0.8, LA 3.9. CXR with concern with multifocal pneumonia. He was given Vanc/Cefepime and we were asked to admit  Review of Systems: As per HPI otherwise 10 point review of systems negative.    Past Medical History:  Diagnosis Date  . ANKLE SPRAIN 12/16/2006   Qualifier: Diagnosis of  By: Tawanna Cooler MD, Eugenio Hoes   . ANXIETY 10/25/2006  . CELLULITIS, LEG, RIGHT 09/08/2007   Qualifier: Diagnosis of  By: Tawanna Cooler MD, Eugenio Hoes   . Chronic pain disorder 11/30/2015  . COPD 10/25/2006  . 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 DIABETES MELLITUS, TYPE I 10/25/2006  . Diabetic polyneuropathy (HCC) 04/08/2007   Qualifier: Diagnosis of  By: Tawanna Cooler MD, Eugenio Hoes   . 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 Essential hypertension 10/25/2006   Qualifier: Diagnosis of  By: Rosette Reveal RN, Jorene Minors   . Generalized osteoarthritis of multiple sites 11/30/2015  . HYPERTENSION 10/25/2006  . 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 Low back pain with radiation 01/23/2016  . Polyneuropathy in diabetes(357.2) 04/08/2007  . Psoriasis   . TOBACCO ABUSE 07/08/2007  . VENOUS INSUFFICIENCY 11/25/2007    Past Surgical History:  Procedure Laterality Date  . TESTICLE REMOVAL     R testicle     reports that he has been smoking pipe. He has never used smokeless tobacco. He reports that he does not drink alcohol or use drugs.  No Known Allergies  Family History  Problem Relation Age of Onset  . Cancer Mother  Unknown   . Diabetes Sister     Prior to Admission medications   Medication Sig Start Date End Date Taking? Authorizing Provider  ACCU-CHEK SOFTCLIX LANCETS lancets Use as instructed 01/08/18  Yes [place], [name], MD  aspirin 81 MG chewable tablet Chew 81 mg by mouth 2 (two) times a week. Pt takes on Monday and Friday.   Yes [provider]  atorvastatin (LIPITOR) 40 MG tablet Take 1 tablet (40 mg total) by mouth daily. 12/23/17 03/23/18 Yes [place], [name], MD  baclofen (LIORESAL) 10 MG tablet TAKE 1 TABLET BY MOUTH 3 TIMES DAILY 02/05/18  Yes Marcello Fennel, MD  collagenase (SANTYL) ointment Apply 1 application topically daily. Patient taking differently: Apply 1 application topically daily. To Right foot. 09/16/17  Yes Felecia Shelling, DPM  diclofenac (VOLTAREN) 50 MG EC tablet Take 1 tablet (50 mg total) by mouth 3 (three) times daily. 02/05/18  Yes Marcello Fennel, MD  DULoxetine (CYMBALTA) 60 MG capsule TAKE 1 CAPSULE BY MOUTH EVERY DAY 03/05/18  Yes Patel, Maryln Gottron, MD  feeding supplement, ENSURE ENLIVE, (ENSURE ENLIVE) LIQD Take 237 mLs by mouth 3 (three) times daily between meals. 01/17/18  Yes Mikhail, Maryann, DO  gabapentin (NEURONTIN) 600 MG tablet Take 1 tablet (600 mg total) by mouth 3 (three) times daily. 10/04/17  Yes Sheikh, Omair Latif, DO  Insulin Glargine (LANTUS SOLOSTAR) 100 UNIT/ML Solostar Pen Inject 20-25 units into skin at 10 pm daily. If blood sugar is over 200, increase by 5 units every 1 to 2 weeks. 01/10/18  Yes Gordy Savers, MD  Insulin Syringe-Needle U-100 (INSULIN SYRINGE .5CC/31GX5/16") 31G X 5/16" 0.5 ML MISC USE AS DIRECTED WITH LANTUS 01/08/18  Yes [place], [name], MD  ipratropium-albuterol (DUONEB) 0.5-2.5 (3) MG/3ML SOLN Take 3 mLs by nebulization every 6 (six) hours as needed. Patient taking differently: Take 3 mLs by nebulization every 6 (six) hours as needed (heophebreathing).  10/04/17  Yes Sheikh, Omair Latif, DO  metFORMIN (GLUCOPHAGE) 500 MG tablet Take 2 tablets (1,000 mg total) by mouth 2 (two) times daily with a meal. 11/26/17  Yes [place], [name], MD  metoprolol succinate (TOPROL-XL) 25 MG 24 hr tablet Take 0.5 tablets (12.5 mg total) by mouth daily. 02/06/17  Yes [place], [name], MD  [facility] VERIO test strip Use to test blood sugar three-four times daily. 03/05/18  Yes [place], [name], MD  polyethylene glycol [facility] / Ethelene Hal) packet Take 17 g by mouth daily. 01/18/18  Yes Mikhail, Nita Sells, DO  tamsulosin (FLOMAX) 0.4 MG CAPS capsule Take 1 capsule (0.4 mg total) by mouth daily after breakfast.  NEED OV. Patient taking differently: Take 0.4 mg by mouth daily as needed (urinary output.). NEED OV. 02/12/18  Yes [place], Peter M, MD  traMADol (ULTRAM) 50 MG tablet Take 1 tablet (50 mg total) by mouth every 6 (six) hours as needed. for pain 02/20/18  Yes Marcello Fennel, MD  triamcinolone ointment (KENALOG) 0.1 % Apply 1 application topically 2 (two) times daily as needed (rash). Compounded as a 1:1 mixture with Eucerin. 02/05/18  Yes [place], [name], MD    Physical Exam: Vitals:   03/13/18 1329 03/13/18 1348 03/13/18 1350 03/13/18 1425  BP:   105/83 (!) 146/89  Pulse:  (!) 111    Resp:  20 18   Temp: 98.8 F (37.1 C)     TempSrc: Rectal     SpO2:  96%    Weight:  Height:          Constitutional: NAD, confused, pale appearing male Eyes: PERRL, lids and conjunctivae normal ENMT: Mucous membranes are dry Neck: normal, supple Respiratory: Diffuse bilateral rhonchi, no wheezing or crackles heard.  Shallow breathing, tachypneic Cardiovascular: Regular rate and rhythm, no murmurs appreciated. No extremity edema. 2+ pedal pulses.  Abdomen: Soft, no tenderness, no masses palpated. Bowel sounds positive.  Musculoskeletal: no clubbing / cyanosis.  Skin: no rashes seen Neurologic: Does not follow commands, moves all 4 extremities spontaneously and appears to have equal strength Psychiatric: Confused, alert to self only  Labs on Admission: I have personally reviewed following labs and imaging studies  CBC: Recent Labs  Lab 03/13/18 1317  WBC 12.8*  NEUTROABS 10.5*  HGB 10.5*  HCT 32.6*  MCV 94.8  PLT 263   Basic Metabolic Panel: Recent Labs  Lab 03/13/18 1317  NA 142  K 5.0  CL 104  CO2 25  GLUCOSE 218*  BUN 52*  CREATININE 1.77*  CALCIUM 8.7*   GFR: Estimated Creatinine Clearance: 36.3 mL/min (A) (by C-G formula based on SCr of 1.77 mg/dL (H)). Liver Function Tests: Recent Labs  Lab 03/13/18 1317  AST 25  ALT 16  ALKPHOS 43  BILITOT 1.2  PROT 6.7   ALBUMIN 2.8*   No results for input(s): LIPASE, AMYLASE in the last 168 hours. No results for input(s): AMMONIA in the last 168 hours. Coagulation Profile: Recent Labs  Lab 03/13/18 1317  INR 1.17   Cardiac Enzymes: Recent Labs  Lab 03/13/18 1317  CKTOTAL 94   BNP (last 3 results) No results for input(s): PROBNP in the last 8760 hours. HbA1C: No results for input(s): HGBA1C in the last 72 hours. CBG: No results for input(s): GLUCAP in the last 168 hours. Lipid Profile: No results for input(s): CHOL, HDL, LDLCALC, TRIG, CHOLHDL, LDLDIRECT in the last 72 hours. Thyroid Function Tests: No results for input(s): TSH, T4TOTAL, FREET4, T3FREE, THYROIDAB in the last 72 hours. Anemia Panel: No results for input(s): VITAMINB12, FOLATE, FERRITIN, TIBC, IRON, RETICCTPCT in the last 72 hours. Urine analysis:    Component Value Date/Time   COLORURINE YELLOW 01/13/2018 1923   APPEARANCEUR CLOUDY (A) 01/13/2018 1923   LABSPEC 1.009 01/13/2018 1923   PHURINE 5.0 01/13/2018 1923   GLUCOSEU 150 (A) 01/13/2018 1923   GLUCOSEU NEGATIVE 09/30/2008 0851   HGBUR LARGE (A) 01/13/2018 1923   HGBUR trace-lysed 03/29/2010 0940   BILIRUBINUR NEGATIVE 01/13/2018 1923   BILIRUBINUR neg 01/13/2014 1141   KETONESUR NEGATIVE 01/13/2018 1923   PROTEINUR NEGATIVE 01/13/2018 1923   UROBILINOGEN 1.0 01/13/2014 1141   UROBILINOGEN 0.2 03/29/2010 0940   NITRITE NEGATIVE 01/13/2018 1923   LEUKOCYTESUR NEGATIVE 01/13/2018 1923     Radiological Exams on Admission: Dg Chest 2 View  Result Date: 03/13/2018 CLINICAL DATA:  Possible sepsis EXAM: CHEST - 2 VIEW COMPARISON:  Chest x-ray of August 1929 FINDINGS: The lungs are mildly hyperinflated with hemidiaphragm flattening. There is a new dense infiltrate in the left lower lobe posteriorly. There are patchy airspace opacities elsewhere in the left lung and to a lesser extent in the right lung worrisome for septic emboli. The heart and pulmonary vascularity  are normal. The mediastinum is normal in width. There is calcification in the wall of the aortic arch. IMPRESSION: COPD. Left lower lobe pneumonia. Patchy airspace opacities elsewhere in the left lung and to a lesser extent on the right worrisome for multifocal pneumonia or septic emboli. Electronically Signed   By:  David  [place] M.D.   On: 03/13/2018 13:53   Ct Head Wo Contrast  Result Date: 03/13/2018 CLINICAL DATA:  72 year old male with unexplained altered mental status. EXAM: CT HEAD WITHOUT CONTRAST TECHNIQUE: Contiguous axial images were obtained from the base of the skull through the vertex without intravenous contrast. COMPARISON:  Brain MRI 01/14/2018, head CT 01/13/2018 and earlier. FINDINGS: Brain: Chronic confluent cerebral white matter hypodensity, moderate to severe heterogeneity in the left deep gray matter nuclei, and pons. Stable gray-white matter differentiation throughout the brain. No cortical encephalomalacia identified. No midline shift, ventriculomegaly, mass effect, evidence of mass lesion, intracranial hemorrhage or evidence of cortically based acute infarction. Vascular: Calcified atherosclerosis at the skull base. No suspicious intracranial vascular hyperdensity. Skull: Negative. Sinuses/Orbits: Visualized paranasal sinuses and mastoids are stable and well pneumatized. Other: Stable orbit and scalp soft tissues. IMPRESSION: No acute intracranial abnormality. Stable non contrast CT appearance of the brain with advanced chronic small vessel disease. Electronically Signed   By: Odessa Fleming M.D.   On: 03/13/2018 14:14    EKG: Independently reviewed. Sinus tachycardic  Assessment/Plan Principal Problem:   HCAP (healthcare-associated pneumonia) Active Problems:   Diabetes mellitus type 2 with neurological manifestations (HCC)   Diabetic polyneuropathy (HCC)   Essential hypertension   Acute metabolic encephalopathy    Acute metabolic encephalopathy likely in the setting of  sepsis due to multifocal pneumonia, acute hypoxic respiratory failure -Patient started on vancomycin and cefepime in the ED, continue.  Cultures obtained, will add on strep pneumo and Legionella antigens.  No flulike symptoms -Admit to stepdown, oxygen as needed to maintain sats above 90% -Albuterol as needed -Monitor cultures and tailor antibiotics based on results -Repeat lactic acid -Speech to evaluate to rule out aspiration component given concern for underlying dementia -Concern for underlying COPD, no wheezing on admission, continue duo nebs  Acute kidney injury -Likely in the setting of sepsis and poor p.o. intake, provide IV fluids and repeat renal function in the morning  Type 2 diabetes mellitus with diabetic neuropathy -Continue home Lantus and sliding scale, decreased dose of Lantus.  Continue gabapentin at a lower dose  Elevated troponin -No reports of chest pain, likely demand in the setting of sepsis, continue to cycle  Hypertension -Hold antihypertensives today, resume starting tomorrow  Presumed dementia -Per wife report, unclear baseline to me, may need formal evaluation as an outpatient   DVT prophylaxis: [place] Code Status: Full code  Family Communication: wife at bedside Disposition Plan: home vsSNF whenready Consults called: none     Admission status: observation   At the point of initial evaluation, it is my clinical opinion that admission for OBSERVATION is reasonable and necessary because the patient's presenting complaints in the context of their chronic conditions represent sufficient risk of deterioration or significant morbidity to constitute reasonable grounds for close observation in the hospital setting, but that the patient may be medically stable for discharge from the hospital within 24 to 48 hours.    Pamella Pert, MD [facility] Pager [phone]  If 7PM-7AM, please contact night-coverage [email or web address] Password  [email or web address]  03/13/2018, 2:42 PM

## 2018-03-13 NOTE — Progress Notes (Signed)
dayshift nurse reported pt has not voided since she received him around 1600. Bladder scan reading >999. MD notified. Order for foley catheter received. Foley catheter placed using sterile technique. 1500 cc of amber colored urine returned.

## 2018-03-13 NOTE — ED Notes (Signed)
Bed: ZO10 Expected date:  Expected time:  Means of arrival:  Comments: EMS generalized weakness, fall , poss sepsis?

## 2018-03-13 NOTE — ED Triage Notes (Signed)
Pt BIB EMS from home.  Called out for back pain, arrived and pt was on floor, pillow under head. Daughter said he didn't fall, but she had to "get him out of the back bedroom"  Pt has had difficulty urinating, generalized pain worse in back. Alert to person, place.  Temp 101 on scene.  Pt appeared "very pale, mild cyanosis to lips, low cap refill ~3-4 secs"  Put on spO2 probe and initial reading was appx 75% on RA.  Rhonci in all 4 lobes, audible in close proximity.  Placed on O2 with nonrebreather, which helped to bring it up to 96% EtCO2- 32 upon last reading.

## 2018-03-13 NOTE — Progress Notes (Addendum)
Heart rate in 120's at 2300. MD notified. HR jumped to 130's. upon assessment pt c/o chest pain. MD notified. States that cardiology has been notified of increased troponin and to just continue monitoring. EKG obtained, placed in chart. Showing sinus tach with ST depression. Pain medicine administered for discomfort. Will continue to monitor pt closely at this time.

## 2018-03-13 NOTE — ED Notes (Signed)
Attempted report x1. 

## 2018-03-13 NOTE — ED Provider Notes (Signed)
[facility]-EMERGENCY DEPT Provider Note   CSN: [id number] Arrival date & time: 03/13/18  1234     History   Chief Complaint Chief Complaint  Patient presents with  . Weakness  . Shortness of Breath  . Code Sepsis    HPI Jeremy Gill is a 72 y.o. male.  HPI  Patient presents altered, via EMS. The patient himself is withdrawn, answers questions sporadically, with very brief verbal responses, but is an unreliable historian. EMS notes the patient was found to the ground by family members, with increased work of breathing, questionable fever. Per EMS the patient was hypoxic on room air, improved with supplemental oxygen. Level 5 caveat secondary to the patient's altered mental status, acuity of condition.  After the initial evaluation and interventions family members arrived. They note that the patient has had increasing pain generalized weakness for the past week, but did not develop respiratory difficulty until the past 2 or 3 days. They are unclear if the patient has had fever, denies vomiting, note that his weakness and confusion are new/worsening. No recent medication change, diet change.    Past Medical History:  Diagnosis Date  . ANKLE SPRAIN 12/16/2006   Qualifier: Diagnosis of  By: Tawanna Cooler MD, Eugenio Hoes   . ANXIETY 10/25/2006  . CELLULITIS, LEG, RIGHT 09/08/2007   Qualifier: Diagnosis of  By: Tawanna Cooler MD, Eugenio Hoes   . Chronic pain disorder 11/30/2015  . COPD 10/25/2006  . 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 DIABETES MELLITUS, TYPE I 10/25/2006  . Diabetic polyneuropathy (HCC) 04/08/2007   Qualifier: Diagnosis of  By: Tawanna Cooler MD, Eugenio Hoes   . 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 Essential hypertension 10/25/2006   Qualifier: Diagnosis of  By: Rosette Reveal RN, Jorene Minors   . Generalized osteoarthritis of multiple sites 11/30/2015  . HYPERTENSION 10/25/2006  .  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 Low back pain with radiation 01/23/2016  . Polyneuropathy in diabetes(357.2) 04/08/2007  . Psoriasis   . TOBACCO ABUSE 07/08/2007  . VENOUS INSUFFICIENCY 11/25/2007    Patient Active Problem List   Diagnosis Date Noted  . HCAP (healthcare-associated pneumonia) 03/13/2018  . Abnormality of gait 02/27/2018  . Bilateral lower extremity edema 02/05/2018  . Pruritus of skin 02/05/2018  . Protein-calorie malnutrition, severe 01/16/2018  . Acute urinary retention 01/13/2018  . Acute encephalopathy 01/13/2018  . Obstipation 01/13/2018  . Chronic pain syndrome 11/01/2017  . Pressure injury of skin 10/02/2017  . Anemia   . Depression 09/30/2017  . Acute renal failure superimposed on stage 3 chronic kidney disease (HCC) 09/30/2017  . Sepsis (HCC) 09/30/2017  . Hypokalemia 09/30/2017  . Hyperlipidemia associated with type 2 diabetes mellitus (HCC) 02/05/2017  . AKI (acute kidney injury) (HCC) 12/19/2016  . Acute metabolic encephalopathy 12/19/2016  . DKA (diabetic ketoacidoses) (HCC) 12/18/2016  . Hyperkalemia 12/18/2016  . Anxiety disorder, unspecified 02/08/2016  . Low back pain with radiation 01/23/2016  . Chronic pain disorder 11/30/2015  . Generalized osteoarthritis of multiple sites 11/30/2015  . DYSHIDROSIS 06/08/2008  . VENOUS INSUFFICIENCY 11/25/2007  . 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 TOBACCO ABUSE 07/08/2007  . Diabetic polyneuropathy (HCC) 04/08/2007  . 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 Essential hypertension 10/25/2006  . COPD (chronic obstructive pulmonary disease) (HCC) 10/25/2006    Past  Surgical History:  Procedure Laterality Date  . TESTICLE REMOVAL     R testicle        Home Medications    Prior to Admission medications   Medication Sig Start Date End Date Taking? Authorizing Provider  ACCU-CHEK SOFTCLIX  LANCETS lancets Use as instructed 01/08/18  Yes [place], [name], MD  aspirin 81 MG chewable tablet Chew 81 mg by mouth 2 (two) times a week. Pt takes on Monday and Friday.   Yes [provider]  atorvastatin (LIPITOR) 40 MG tablet Take 1 tablet (40 mg total) by mouth daily. 12/23/17 03/23/18 Yes [place], [name], MD  baclofen (LIORESAL) 10 MG tablet TAKE 1 TABLET BY MOUTH 3 TIMES DAILY 02/05/18  Yes Marcello Fennel, MD  collagenase (SANTYL) ointment Apply 1 application topically daily. Patient taking differently: Apply 1 application topically daily. To Right foot. 09/16/17  Yes Felecia Shelling, DPM  diclofenac (VOLTAREN) 50 MG EC tablet Take 1 tablet (50 mg total) by mouth 3 (three) times daily. 02/05/18  Yes Marcello Fennel, MD  DULoxetine (CYMBALTA) 60 MG capsule TAKE 1 CAPSULE BY MOUTH EVERY DAY 03/05/18  Yes Patel, Maryln Gottron, MD  feeding supplement, ENSURE ENLIVE, (ENSURE ENLIVE) LIQD Take 237 mLs by mouth 3 (three) times daily between meals. 01/17/18  Yes Mikhail, Maryann, DO  gabapentin (NEURONTIN) 600 MG tablet Take 1 tablet (600 mg total) by mouth 3 (three) times daily. 10/04/17  Yes Sheikh, Omair Latif, DO  Insulin Glargine (LANTUS SOLOSTAR) 100 UNIT/ML Solostar Pen Inject 20-25 units into skin at 10 pm daily. If blood sugar is over 200, increase by 5 units every 1 to 2 weeks. 01/10/18  Yes Gordy Savers, MD  Insulin Syringe-Needle U-100 (INSULIN SYRINGE .5CC/31GX5/16") 31G X 5/16" 0.5 ML MISC USE AS DIRECTED WITH LANTUS 01/08/18  Yes [place], [name], MD  ipratropium-albuterol (DUONEB) 0.5-2.5 (3) MG/3ML SOLN Take 3 mLs by nebulization every 6 (six) hours as needed. Patient taking differently: Take 3 mLs by nebulization every 6 (six) hours as needed (heophebreathing).  10/04/17  Yes Sheikh, Omair Latif, DO  metFORMIN (GLUCOPHAGE) 500 MG tablet Take 2 tablets (1,000 mg total) by mouth 2 (two) times daily with a meal. 11/26/17  Yes [place], [name], MD  metoprolol succinate (TOPROL-XL) 25  MG 24 hr tablet Take 0.5 tablets (12.5 mg total) by mouth daily. 02/06/17  Yes [place], [name], MD  [facility] VERIO test strip Use to test blood sugar three-four times daily. 03/05/18  Yes [place], [name], MD  polyethylene glycol [facility] Ethelene Hal) packet Take 17 g by mouth daily. 01/18/18  Yes Mikhail, Nita Sells, DO  tamsulosin (FLOMAX) 0.4 MG CAPS capsule Take 1 capsule (0.4 mg total) by mouth daily after breakfast. NEED OV. Patient taking differently: Take 0.4 mg by mouth daily as needed (urinary output.). NEED OV. 02/12/18  Yes [place], Peter M, MD  traMADol (ULTRAM) 50 MG tablet Take 1 tablet (50 mg total) by mouth every 6 (six) hours as needed. for pain 02/20/18  Yes Marcello Fennel, MD  triamcinolone ointment (KENALOG) 0.1 % Apply 1 application topically 2 (two) times daily as needed (rash). Compounded as a 1:1 mixture with Eucerin. 02/05/18  Yes [place], [name], MD    Family History Family History  Problem Relation Age of Onset  . Cancer Mother        Unknown   . Diabetes Sister     Social History Social History   Tobacco Use  . Smoking status:  Current Every Day Smoker    Types: Pipe    Last attempt to quit: 05/29/2011    Years since quitting: 6.7  . Smokeless tobacco: Never Used  Substance Use Topics  . Alcohol use: No  . Drug use: No     Allergies   Patient has no known allergies.   Review of Systems Review of Systems  Unable to perform ROS: Acuity of condition     Physical Exam Updated Vital Signs BP (!) 146/89   Pulse (!) 111   Temp 98.8 F (37.1 C) (Rectal)   Resp 18   Ht 6' (1.829 m)   Wt 68 kg   SpO2 96%   BMI 20.34 kg/m   Physical Exam  Constitutional: He appears ill. He appears distressed.  HENT:  Head: Normocephalic and atraumatic.  Eyes: Conjunctivae and EOM are normal.  Cardiovascular: Regular rhythm. Tachycardia present.  Pulmonary/Chest: No stridor. Tachypnea noted. He has decreased breath sounds.  Abdominal: He exhibits no distension.    Musculoskeletal: He exhibits no edema.  Neurological: He is alert. He displays atrophy. He exhibits abnormal muscle tone.  Patient is oriented, moves all extremities minimally, spontaneously, has some resistance against gravity, but does not reliably follow commands for neurologic exam.   Skin: Skin is warm and dry.  Psychiatric: Cognition and memory are impaired.  Nursing note and vitals reviewed.    ED Treatments / Results  Labs (all labs ordered are listed, but only abnormal results are displayed) Labs Reviewed  COMPREHENSIVE METABOLIC PANEL - Abnormal; Notable for the following components:      Result Value   Glucose, Bld 218 (*)    BUN 52 (*)    Creatinine, Ser 1.77 (*)    Calcium 8.7 (*)    Albumin 2.8 (*)    GFR calc non Af Amer 37 (*)    GFR calc Af Amer 42 (*)    All other components within normal limits  CBC WITH DIFFERENTIAL/PLATELET - Abnormal; Notable for the following components:   WBC 12.8 (*)    RBC 3.44 (*)    Hemoglobin 10.5 (*)    HCT 32.6 (*)    Neutro Abs 10.5 (*)    Abs Immature Granulocytes 0.12 (*)    All other components within normal limits  I-STAT CG4 LACTIC ACID, ED - Abnormal; Notable for the following components:   Lactic Acid, Venous 3.92 (*)    All other components within normal limits  I-STAT TROPONIN, ED - Abnormal; Notable for the following components:   Troponin i, poc 0.83 (*)    All other components within normal limits  CULTURE, BLOOD (ROUTINE X 2)  CULTURE, BLOOD (ROUTINE X 2)  URINE CULTURE  EXPECTORATED SPUTUM ASSESSMENT W REFEX TO RESP CULTURE  PROTIME-INR  CK  OCCULT BLOOD X 1 CARD TO LAB, STOOL  URINALYSIS, ROUTINE W REFLEX MICROSCOPIC  STREP PNEUMONIAE URINARY ANTIGEN  LEGIONELLA PNEUMOPHILA SEROGP 1 UR AG  I-STAT CG4 LACTIC ACID, ED    EKG EKG Interpretation  Date/Time:  Thursday March 13 2018 12:49:46 EDT Ventricular Rate:  114 PR Interval:    QRS Duration: 91 QT Interval:  335 QTC Calculation: 462 R  Axis:   76 Text Interpretation:  Sinus tachycardia RSR' in V1 or V2, probably normal variant Abnormal ekg Confirmed by Gerhard Munch [phone]) on 03/13/2018 1:07:09 PM   Radiology Dg Chest 2 View  Result Date: 03/13/2018 CLINICAL DATA:  Possible sepsis EXAM: CHEST - 2 VIEW COMPARISON:  Chest x-ray of August  1929 FINDINGS: The lungs are mildly hyperinflated with hemidiaphragm flattening. There is a new dense infiltrate in the left lower lobe posteriorly. There are patchy airspace opacities elsewhere in the left lung and to a lesser extent in the right lung worrisome for septic emboli. The heart and pulmonary vascularity are normal. The mediastinum is normal in width. There is calcification in the wall of the aortic arch. IMPRESSION: COPD. Left lower lobe pneumonia. Patchy airspace opacities elsewhere in the left lung and to a lesser extent on the right worrisome for multifocal pneumonia or septic emboli. Electronically Signed   By: David  [place] M.D.   On: 03/13/2018 13:53   Ct Head Wo Contrast  Result Date: 03/13/2018 CLINICAL DATA:  72 year old male with unexplained altered mental status. EXAM: CT HEAD WITHOUT CONTRAST TECHNIQUE: Contiguous axial images were obtained from the base of the skull through the vertex without intravenous contrast. COMPARISON:  Brain MRI 01/14/2018, head CT 01/13/2018 and earlier. FINDINGS: Brain: Chronic confluent cerebral white matter hypodensity, moderate to severe heterogeneity in the left deep gray matter nuclei, and pons. Stable gray-white matter differentiation throughout the brain. No cortical encephalomalacia identified. No midline shift, ventriculomegaly, mass effect, evidence of mass lesion, intracranial hemorrhage or evidence of cortically based acute infarction. Vascular: Calcified atherosclerosis at the skull base. No suspicious intracranial vascular hyperdensity. Skull: Negative. Sinuses/Orbits: Visualized paranasal sinuses and mastoids are stable and well  pneumatized. Other: Stable orbit and scalp soft tissues. IMPRESSION: No acute intracranial abnormality. Stable non contrast CT appearance of the brain with advanced chronic small vessel disease. Electronically Signed   By: Odessa Fleming M.D.   On: 03/13/2018 14:14    Procedures Procedures (including critical care time)  Medications Ordered in ED Medications  sodium chloride 0.9 % bolus 1,000 mL (1,000 mLs Intravenous New Bag/Given 03/13/18 1350)  ceFEPIme (MAXIPIME) 2 g in sodium chloride 0.9 % 100 mL IVPB (2 g Intravenous New Bag/Given 03/13/18 1425)  vancomycin (VANCOCIN) IVPB 1000 mg/200 mL premix (has no administration in time range)     Initial Impression / Assessment and Plan / ED Course  I have reviewed the triage vital signs and the nursing notes.  Pertinent labs & imaging results that were available during my care of the patient were reviewed by me and considered in my medical decision making (see chart for details).    After the initial evaluation with concern for pneumonia versus sepsis versus intracranial pathology patient had broad differential considered, evaluated with labs, CT, x-ray. With concern for sepsis patient received empiric broad-spectrum antibiotics.  Update:, Repeat exam with family members present, they provide additional details of the HPI. Labs notable for lactic acidosis, elevated troponin, acute kidney injury, all concerning for sepsis with altered mental status/encephalopathy. Patient continues to receive fluid resuscitation, will require admission to the stepdown unit for further evaluation and management.   Final Clinical Impressions(s) / ED Diagnoses   Final diagnoses:  Sepsis with encephalopathy without septic shock, due to unspecified organism [facility])   CRITICAL CARE Performed by: Gerhard Munch Total critical care time: 40 minutes Critical care time was exclusive of separately billable procedures and treating other patients. Critical care was  necessary to treat or prevent imminent or life-threatening deterioration. Critical care was time spent personally by me on the following activities: development of treatment plan with patient and/or surrogate as well as nursing, discussions with consultants, evaluation of patient's response to treatment, examination of patient, obtaining history from patient or surrogate, ordering and performing treatments and interventions, ordering and  review of laboratory studies, ordering and review of radiographic studies, pulse oximetry and re-evaluation of patient's condition.    Gerhard Munch, MD 03/13/18 [phone]

## 2018-03-13 NOTE — ED Notes (Signed)
ED TO INPATIENT HANDOFF REPORT  Name/Age/Gender Jeremy Gill 71 y.o. male  Code Status Code Status History    Date Active Date Inactive Code Status Order ID Comments User Context   01/14/2018 0009 01/17/2018 1935 Full Code [id number]  Toy Baker, MD Inpatient   10/01/2017 0016 10/04/2017 1808 Full Code [id number]  Ivor Costa, MD ED   12/18/2016 1924 12/20/2016 1635 Full Code [id number]  Jani Gravel, MD Inpatient      Home/SNF/Other Home  Chief Complaint sepsis  Level of Care/Admitting Diagnosis ED Disposition    ED Disposition Condition [facility]: [place] [[id number]]  Level of Care: Stepdown [14]  Admit to SDU based on following criteria: Severe physiological/psychological symptoms:  Any diagnosis requiring assessment & intervention at least every 4 hours on an ongoing basis to obtain desired patient outcomes including stability and rehabilitation  Diagnosis: HCAP (healthcare-associated pneumonia) [007121]  Admitting Physician: Caren Griffins [phone]  Attending Physician: Caren Griffins [[id number]  PT Class (Do Not Modify): Observation [104]  PT Acc Code (Do Not Modify): Observation [10022]       Medical History Past Medical History:  Diagnosis Date  . ANKLE SPRAIN 12/16/2006   Qualifier: Diagnosis of  By: Sherren Mocha MD, Jory Ee   . ANXIETY 10/25/2006  . CELLULITIS, LEG, RIGHT 09/08/2007   Qualifier: Diagnosis of  By: Sherren Mocha MD, Jory Ee   . Chronic pain disorder 11/30/2015  . COPD 10/25/2006  . Diabetes mellitus type 2 with neurological manifestations ([place]) 10/25/2006   Qualifier: Diagnosis of  By: Cari Caraway RN, Ventura Sellers   . DIABETES MELLITUS, TYPE I 10/25/2006  . Diabetic polyneuropathy ([place]) 04/08/2007   Qualifier: Diagnosis of  By: Sherren Mocha MD, Jory Ee   . 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 Essential hypertension 10/25/2006   Qualifier: Diagnosis of  By: Cari Caraway RN, Ventura Sellers   . Generalized osteoarthritis of multiple sites 11/30/2015   . HYPERTENSION 10/25/2006  . 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 Low back pain with radiation 01/23/2016  . Polyneuropathy in diabetes(357.2) 04/08/2007  . Psoriasis   . TOBACCO ABUSE 07/08/2007  . VENOUS INSUFFICIENCY 11/25/2007    Allergies No Known Allergies  IV Location/Drains/Wounds Patient Lines/Drains/Airways Status   Active Line/Drains/Airways    Name:   Placement date:   Placement time:   Site:   Days:   Peripheral IV 03/13/18 Right Forearm   03/13/18    -    Forearm   less than 1   Peripheral IV 03/13/18 Left Forearm   03/13/18    -    Forearm   less than 1   External Urinary Catheter   03/13/18    1331    -   less than 1   Pressure Injury 10/01/17 Stage III -  Full thickness tissue loss. Subcutaneous fat may be visible but bone, tendon or muscle are NOT exposed.   10/01/17    1340     163   Pressure Injury 01/14/18 Stage II -  Partial thickness loss of dermis presenting as a shallow open ulcer with a red, pink wound bed without slough. stage II pink wound bed   01/14/18    0141     58          Labs/Imaging Results for orders placed or performed during the hospital encounter of 03/13/18 (from the past 48 hour(s))  I-Stat Troponin, ED (not at  [facility])     Status: Abnormal   Collection Time: 03/13/18  1:12 PM  Result Value Ref Range   Troponin i, poc 0.83 (HH) 0.00 - 0.08 ng/mL   Comment NOTIFIED PHYSICIAN    Comment 3            Comment: Due to the release kinetics of cTnI, a negative result within the first hours of the onset of symptoms does not rule out myocardial infarction with certainty. If myocardial infarction is still suspected, repeat the test at appropriate intervals.   I-Stat CG4 Lactic Acid, ED     Status: Abnormal   Collection Time: 03/13/18  1:14 PM  Result Value Ref Range   Lactic Acid, Venous 3.92 (HH) 0.5 - 1.9 mmol/L   Comment NOTIFIED PHYSICIAN   Comprehensive metabolic panel     Status:  Abnormal   Collection Time: 03/13/18  1:17 PM  Result Value Ref Range   Sodium 142 135 - 145 mmol/L   Potassium 5.0 3.5 - 5.1 mmol/L   Chloride 104 98 - 111 mmol/L   CO2 25 22 - 32 mmol/L   Glucose, Bld 218 (H) 70 - 99 mg/dL   BUN 52 (H) 8 - 23 mg/dL   Creatinine, Ser 1.77 (H) 0.61 - 1.24 mg/dL   Calcium 8.7 (L) 8.9 - 10.3 mg/dL   Total Protein 6.7 6.5 - 8.1 g/dL   Albumin 2.8 (L) 3.5 - 5.0 g/dL   AST 25 15 - 41 U/L   ALT 16 0 - 44 U/L   Alkaline Phosphatase 43 38 - 126 U/L   Total Bilirubin 1.2 0.3 - 1.2 mg/dL   GFR calc non Af Amer 37 (L) >60 mL/min   GFR calc Af Amer 42 (L) >60 mL/min    Comment: (NOTE) The eGFR has been calculated using the CKD EPI equation. This calculation has not been validated in all clinical situations. eGFR's persistently <60 mL/min signify possible Chronic Kidney Disease.    Anion gap 13 5 - 15    Comment: Performed at [facility], [place] [address] [place], [place] [address]  CBC with Differential     Status: Abnormal   Collection Time: 03/13/18  1:17 PM  Result Value Ref Range   WBC 12.8 (H) 4.0 - 10.5 K/uL   RBC 3.44 (L) 4.22 - 5.81 MIL/uL   Hemoglobin 10.5 (L) 13.0 - 17.0 g/dL   HCT 32.6 (L) 39.0 - 52.0 %   MCV 94.8 80.0 - 100.0 fL   MCH 30.5 26.0 - 34.0 pg   MCHC 32.2 30.0 - 36.0 g/dL   RDW 13.8 11.5 - 15.5 %   Platelets 263 150 - 400 K/uL   nRBC 0.0 0.0 - 0.2 %   Neutrophils Relative % 82 %   Neutro Abs 10.5 (H) 1.7 - 7.7 K/uL   Lymphocytes Relative 9 %   Lymphs Abs 1.2 0.7 - 4.0 K/uL   Monocytes Relative 5 %   Monocytes Absolute 0.7 0.1 - 1.0 K/uL   Eosinophils Relative 2 %   Eosinophils Absolute 0.3 0.0 - 0.5 K/uL   Basophils Relative 1 %   Basophils Absolute 0.1 0.0 - 0.1 K/uL   Immature Granulocytes 1 %   Abs Immature Granulocytes 0.12 (H) 0.00 - 0.07 K/uL   Burr Cells PRESENT     Comment: Performed at [facility], [place] [address] [place], [place] [address]  Protime-INR     Status: None    Collection Time: 03/13/18  1:17 PM  Result Value Ref Range   Prothrombin Time 14.8 11.4 - 15.2 seconds   INR 1.17     Comment: Performed at [facility], [place] [address], [place], [place] [address]  CK     Status: None   Collection Time: 03/13/18  1:17 PM  Result Value Ref Range   Total CK 94 49 - 397 U/L    Comment: Performed at [facility], [place] [address] [place], [place] [address]  Occult blood card to lab, stool     Status: None   Collection Time: 03/13/18  1:17 PM  Result Value Ref Range   Fecal Occult Bld NEGATIVE NEGATIVE    Comment: Performed at [facility], [place] [address] [place], [place] [address]   Dg Chest 2 View  Result Date: 03/13/2018 CLINICAL DATA:  Possible sepsis EXAM: CHEST - 2 VIEW COMPARISON:  Chest x-ray of August 1929 FINDINGS: The lungs are mildly hyperinflated with hemidiaphragm flattening. There is a new dense infiltrate in the left lower lobe posteriorly. There are patchy airspace opacities elsewhere in the left lung and to a lesser extent in the right lung worrisome for septic emboli. The heart and pulmonary vascularity are normal. The mediastinum is normal in width. There is calcification in the wall of the aortic arch. IMPRESSION: COPD. Left lower lobe pneumonia. Patchy airspace opacities elsewhere in the left lung and to a lesser extent on the right worrisome for multifocal pneumonia or septic emboli. Electronically Signed   By: David  Martinique M.D.   On: 03/13/2018 13:53   Ct Head Wo Contrast  Result Date: 03/13/2018 CLINICAL DATA:  72 year old male with unexplained altered mental status. EXAM: CT HEAD WITHOUT CONTRAST TECHNIQUE: Contiguous axial images were obtained from the base of the skull through the vertex without intravenous contrast. COMPARISON:  Brain MRI 01/14/2018, head CT 01/13/2018 and earlier. FINDINGS: Brain: Chronic confluent cerebral white matter hypodensity, moderate to severe  heterogeneity in the left deep gray matter nuclei, and pons. Stable gray-white matter differentiation throughout the brain. No cortical encephalomalacia identified. No midline shift, ventriculomegaly, mass effect, evidence of mass lesion, intracranial hemorrhage or evidence of cortically based acute infarction. Vascular: Calcified atherosclerosis at the skull base. No suspicious intracranial vascular hyperdensity. Skull: Negative. Sinuses/Orbits: Visualized paranasal sinuses and mastoids are stable and well pneumatized. Other: Stable orbit and scalp soft tissues. IMPRESSION: No acute intracranial abnormality. Stable non contrast CT appearance of the brain with advanced chronic small vessel disease. Electronically Signed   By: Genevie Ann M.D.   On: 03/13/2018 14:14    Pending Labs Unresulted Labs (From admission, onward)    Start     Ordered   03/13/18 1442  Strep pneumoniae urinary antigen  Once,   R     03/13/18 1441   03/13/18 1442  Legionella Pneumophila Serogp 1 Ur Ag  Once,   R     03/13/18 1441   03/13/18 1442  Expectorated sputum assessment w rflx to resp cult  Once,   R     03/13/18 1441   03/13/18 1307  Urine culture  STAT,   STAT     03/13/18 1306   03/13/18 1306  Culture, blood (Routine x 2)  BLOOD CULTURE X 2,   STAT     03/13/18 1306   03/13/18 1306  Urinalysis, Routine w reflex microscopic  STAT,   STAT     03/13/18 1306   Signed and Held  Comprehensive metabolic panel  Tomorrow morning,   R     Signed and Held   Signed and Held  CBC  Tomorrow morning,   R     Signed and Held   Signed and Held  Troponin I (q 6hr x 3)  Now then every 6 hours,   R     Signed and Held   Signed and Held  Lactic acid, plasma  Once,   R     Signed and Held          Vitals/Pain Today's Vitals   03/13/18 1329 03/13/18 1348 03/13/18 1350 03/13/18 1425  BP:   105/83 (!) 146/89  Pulse:  (!) 111    Resp:  20 18   Temp: 98.8 F (37.1 C)     TempSrc: Rectal     SpO2:  96%    Weight:       Height:        Isolation Precautions No active isolations  Medications Medications  vancomycin (VANCOCIN) IVPB 1000 mg/200 mL premix (has no administration in time range)  ceFEPIme (MAXIPIME) 1 g in sodium chloride 0.9 % 100 mL IVPB (has no administration in time range)  vancomycin (VANCOCIN) IVPB 750 mg/150 ml premix (has no administration in time range)  sodium chloride 0.9 % bolus 1,000 mL (1,000 mLs Intravenous New Bag/Given 03/13/18 1350)  ceFEPIme (MAXIPIME) 2 g in sodium chloride 0.9 % 100 mL IVPB (0 g Intravenous Stopped 03/13/18 1517)    Mobility walks with device

## 2018-03-14 ENCOUNTER — Inpatient Hospital Stay (HOSPITAL_COMMUNITY): Payer: PPO

## 2018-03-14 DIAGNOSIS — J449 Chronic obstructive pulmonary disease, unspecified: Secondary | ICD-10-CM

## 2018-03-14 DIAGNOSIS — R778 Other specified abnormalities of plasma proteins: Secondary | ICD-10-CM | POA: Diagnosis present

## 2018-03-14 DIAGNOSIS — I129 Hypertensive chronic kidney disease with stage 1 through stage 4 chronic kidney disease, or unspecified chronic kidney disease: Secondary | ICD-10-CM | POA: Diagnosis present

## 2018-03-14 DIAGNOSIS — R079 Chest pain, unspecified: Secondary | ICD-10-CM

## 2018-03-14 DIAGNOSIS — I10 Essential (primary) hypertension: Secondary | ICD-10-CM | POA: Diagnosis not present

## 2018-03-14 DIAGNOSIS — F0390 Unspecified dementia without behavioral disturbance: Secondary | ICD-10-CM | POA: Diagnosis present

## 2018-03-14 DIAGNOSIS — F419 Anxiety disorder, unspecified: Secondary | ICD-10-CM | POA: Diagnosis present

## 2018-03-14 DIAGNOSIS — R7989 Other specified abnormal findings of blood chemistry: Secondary | ICD-10-CM | POA: Diagnosis present

## 2018-03-14 DIAGNOSIS — Y95 Nosocomial condition: Secondary | ICD-10-CM | POA: Diagnosis present

## 2018-03-14 DIAGNOSIS — B370 Candidal stomatitis: Secondary | ICD-10-CM | POA: Diagnosis present

## 2018-03-14 DIAGNOSIS — E43 Unspecified severe protein-calorie malnutrition: Secondary | ICD-10-CM | POA: Diagnosis present

## 2018-03-14 DIAGNOSIS — E1069 Type 1 diabetes mellitus with other specified complication: Secondary | ICD-10-CM | POA: Diagnosis present

## 2018-03-14 DIAGNOSIS — R64 Cachexia: Secondary | ICD-10-CM | POA: Diagnosis present

## 2018-03-14 DIAGNOSIS — G9340 Encephalopathy, unspecified: Secondary | ICD-10-CM

## 2018-03-14 DIAGNOSIS — L89616 Pressure-induced deep tissue damage of right heel: Secondary | ICD-10-CM | POA: Diagnosis present

## 2018-03-14 DIAGNOSIS — E440 Moderate protein-calorie malnutrition: Secondary | ICD-10-CM

## 2018-03-14 DIAGNOSIS — R1310 Dysphagia, unspecified: Secondary | ICD-10-CM

## 2018-03-14 DIAGNOSIS — E1149 Type 2 diabetes mellitus with other diabetic neurological complication: Secondary | ICD-10-CM

## 2018-03-14 DIAGNOSIS — I214 Non-ST elevation (NSTEMI) myocardial infarction: Secondary | ICD-10-CM

## 2018-03-14 DIAGNOSIS — R6520 Severe sepsis without septic shock: Secondary | ICD-10-CM

## 2018-03-14 DIAGNOSIS — A419 Sepsis, unspecified organism: Secondary | ICD-10-CM | POA: Diagnosis present

## 2018-03-14 DIAGNOSIS — I872 Venous insufficiency (chronic) (peripheral): Secondary | ICD-10-CM | POA: Diagnosis present

## 2018-03-14 DIAGNOSIS — E876 Hypokalemia: Secondary | ICD-10-CM | POA: Diagnosis present

## 2018-03-14 DIAGNOSIS — J189 Pneumonia, unspecified organism: Secondary | ICD-10-CM

## 2018-03-14 DIAGNOSIS — R9431 Abnormal electrocardiogram [ECG] [EKG]: Secondary | ICD-10-CM | POA: Diagnosis not present

## 2018-03-14 DIAGNOSIS — E1022 Type 1 diabetes mellitus with diabetic chronic kidney disease: Secondary | ICD-10-CM | POA: Diagnosis present

## 2018-03-14 DIAGNOSIS — F17200 Nicotine dependence, unspecified, uncomplicated: Secondary | ICD-10-CM | POA: Diagnosis present

## 2018-03-14 DIAGNOSIS — N179 Acute kidney failure, unspecified: Secondary | ICD-10-CM | POA: Diagnosis present

## 2018-03-14 DIAGNOSIS — E785 Hyperlipidemia, unspecified: Secondary | ICD-10-CM | POA: Diagnosis present

## 2018-03-14 DIAGNOSIS — J181 Lobar pneumonia, unspecified organism: Secondary | ICD-10-CM | POA: Diagnosis present

## 2018-03-14 DIAGNOSIS — J440 Chronic obstructive pulmonary disease with acute lower respiratory infection: Secondary | ICD-10-CM | POA: Diagnosis present

## 2018-03-14 DIAGNOSIS — E1042 Type 1 diabetes mellitus with diabetic polyneuropathy: Secondary | ICD-10-CM | POA: Diagnosis present

## 2018-03-14 DIAGNOSIS — G9341 Metabolic encephalopathy: Secondary | ICD-10-CM | POA: Diagnosis present

## 2018-03-14 DIAGNOSIS — E1049 Type 1 diabetes mellitus with other diabetic neurological complication: Secondary | ICD-10-CM | POA: Diagnosis present

## 2018-03-14 DIAGNOSIS — E1051 Type 1 diabetes mellitus with diabetic peripheral angiopathy without gangrene: Secondary | ICD-10-CM | POA: Diagnosis present

## 2018-03-14 DIAGNOSIS — J9601 Acute respiratory failure with hypoxia: Secondary | ICD-10-CM | POA: Diagnosis present

## 2018-03-14 LAB — COMPREHENSIVE METABOLIC PANEL
ALT: 14 U/L (ref 0–44)
AST: 19 U/L (ref 15–41)
Albumin: 2.3 g/dL — ABNORMAL LOW (ref 3.5–5.0)
Alkaline Phosphatase: 36 U/L — ABNORMAL LOW (ref 38–126)
Anion gap: 8 (ref 5–15)
BUN: 41 mg/dL — AB (ref 8–23)
CO2: 25 mmol/L (ref 22–32)
CREATININE: 1.27 mg/dL — AB (ref 0.61–1.24)
Calcium: 8.2 mg/dL — ABNORMAL LOW (ref 8.9–10.3)
Chloride: 109 mmol/L (ref 98–111)
GFR calc Af Amer: >60 mL/min (ref >60)
GFR, EST NON AFRICAN AMERICAN: 55 mL/min — AB (ref >60)
Glucose, Bld: 87 mg/dL (ref 70–99)
Potassium: 3.9 mmol/L (ref 3.5–5.1)
Sodium: 142 mmol/L (ref 135–145)
Total Bilirubin: 0.9 mg/dL (ref 0.3–1.2)
Total Protein: 5.9 g/dL — ABNORMAL LOW (ref 6.5–8.1)

## 2018-03-14 LAB — HEPARIN LEVEL (UNFRACTIONATED)
HEPARIN UNFRACTIONATED: 0.31 [IU]/mL (ref 0.30–0.70)
Heparin Unfractionated: <0.1 IU/mL — ABNORMAL LOW (ref 0.30–0.70)
Heparin Unfractionated: 0.27 IU/mL — ABNORMAL LOW (ref 0.30–0.70)

## 2018-03-14 LAB — CBC
HCT: 28.4 % — ABNORMAL LOW (ref 39.0–52.0)
Hemoglobin: 9.4 g/dL — ABNORMAL LOW (ref 13.0–17.0)
MCH: 30.8 pg (ref 26.0–34.0)
MCHC: 33.1 g/dL (ref 30.0–36.0)
MCV: 93.1 fL (ref 80.0–100.0)
Platelets: 255 10*3/uL (ref 150–400)
RBC: 3.05 MIL/uL — ABNORMAL LOW (ref 4.22–5.81)
RDW: 13.7 % (ref 11.5–15.5)
WBC: 11.7 10*3/uL — AB (ref 4.0–10.5)
nRBC: 0 % (ref 0.0–0.2)

## 2018-03-14 LAB — LIPID PANEL
CHOL/HDL RATIO: 2.3 ratio
CHOLESTEROL: 67 mg/dL (ref 0–200)
HDL: 29 mg/dL — AB (ref >40)
LDL Cholesterol: 28 mg/dL (ref 0–99)
TRIGLYCERIDES: 49 mg/dL (ref <150)
VLDL: 10 mg/dL (ref 0–40)

## 2018-03-14 LAB — GLUCOSE, CAPILLARY
GLUCOSE-CAPILLARY: 91 mg/dL (ref 70–99)
Glucose-Capillary: 60 mg/dL — ABNORMAL LOW (ref 70–99)
Glucose-Capillary: 89 mg/dL (ref 70–99)
Glucose-Capillary: 66 mg/dL — ABNORMAL LOW (ref 70–99)
Glucose-Capillary: 213 mg/dL — ABNORMAL HIGH (ref 70–99)
Glucose-Capillary: 311 mg/dL — ABNORMAL HIGH (ref 70–99)

## 2018-03-14 LAB — ECHOCARDIOGRAM COMPLETE
HEIGHTINCHES: 72 in
Weight: 2525.59 oz

## 2018-03-14 LAB — TROPONIN I
TROPONIN I: 1.63 ng/mL — AB (ref <0.03)
Troponin I: 1.23 ng/mL (ref <0.03)
Troponin I: 1.11 ng/mL (ref <0.03)

## 2018-03-14 LAB — APTT: aPTT: 92 seconds — ABNORMAL HIGH (ref 24–36)

## 2018-03-14 LAB — LACTIC ACID, PLASMA: Lactic Acid, Venous: 0.8 mmol/L (ref 0.5–1.9)

## 2018-03-14 MED ORDER — VANCOMYCIN HCL IN DEXTROSE 1-5 GM/200ML-% IV SOLN
1000.0000 mg | INTRAVENOUS | Status: DC
  Administered 2018-03-14: 1000 mg via INTRAVENOUS
  Filled 2018-03-14: qty 200

## 2018-03-14 MED ORDER — SODIUM CHLORIDE 0.9 % IV SOLN
2.0000 g | Freq: Two times a day (BID) | INTRAVENOUS | Status: DC
  Administered 2018-03-14 – 2018-03-17 (×6): 2 g via INTRAVENOUS
  Filled 2018-03-14 (×6): qty 2

## 2018-03-14 MED ORDER — INSULIN ASPART 100 UNIT/ML ~~LOC~~ SOLN
5.0000 [IU] | Freq: Once | SUBCUTANEOUS | Status: AC
  Administered 2018-03-14: 5 [IU] via SUBCUTANEOUS

## 2018-03-14 MED ORDER — DEXTROSE 50 % IV SOLN
INTRAVENOUS | Status: AC
  Administered 2018-03-14: 25 mL
  Filled 2018-03-14: qty 50

## 2018-03-14 MED ORDER — IPRATROPIUM BROMIDE 0.02 % IN SOLN
0.5000 mg | Freq: Four times a day (QID) | RESPIRATORY_TRACT | Status: DC
  Administered 2018-03-14 (×2): 0.5 mg via RESPIRATORY_TRACT
  Filled 2018-03-14 (×2): qty 2.5

## 2018-03-14 MED ORDER — DEXTROSE-NACL 5-0.9 % IV SOLN
INTRAVENOUS | Status: DC
  Administered 2018-03-14: 14:00:00 via INTRAVENOUS

## 2018-03-14 MED ORDER — GUAIFENESIN ER 600 MG PO TB12
1200.0000 mg | ORAL_TABLET | Freq: Two times a day (BID) | ORAL | Status: DC
  Administered 2018-03-15 – 2018-03-18 (×6): 1200 mg via ORAL
  Filled 2018-03-14 (×8): qty 2

## 2018-03-14 MED ORDER — HEPARIN BOLUS VIA INFUSION
2000.0000 [IU] | Freq: Once | INTRAVENOUS | Status: AC
  Administered 2018-03-14: 2000 [IU] via INTRAVENOUS
  Filled 2018-03-14: qty 2000

## 2018-03-14 MED ORDER — NYSTATIN 100000 UNIT/ML MT SUSP
5.0000 mL | Freq: Four times a day (QID) | OROMUCOSAL | Status: DC
  Administered 2018-03-14 – 2018-03-18 (×17): 500000 [IU] via ORAL
  Filled 2018-03-14 (×17): qty 5

## 2018-03-14 MED ORDER — MOMETASONE FURO-FORMOTEROL FUM 100-5 MCG/ACT IN AERO
2.0000 | INHALATION_SPRAY | Freq: Two times a day (BID) | RESPIRATORY_TRACT | Status: DC
  Administered 2018-03-14 – 2018-03-18 (×8): 2 via RESPIRATORY_TRACT
  Filled 2018-03-14: qty 8.8

## 2018-03-14 MED ORDER — ASPIRIN EC 81 MG PO TBEC: 81.0000 mg | DELAYED_RELEASE_TABLET | Freq: Every day | ORAL | Status: DC

## 2018-03-14 MED ORDER — RESOURCE THICKENUP CLEAR PO POWD
ORAL | Status: DC | PRN
  Filled 2018-03-14: qty 125

## 2018-03-14 MED ORDER — LEVALBUTEROL HCL 0.63 MG/3ML IN NEBU
0.6300 mg | INHALATION_SOLUTION | Freq: Four times a day (QID) | RESPIRATORY_TRACT | Status: DC
  Administered 2018-03-14 (×2): 0.63 mg via RESPIRATORY_TRACT
  Filled 2018-03-14 (×2): qty 3

## 2018-03-14 MED ORDER — IPRATROPIUM BROMIDE 0.02 % IN SOLN: 0.5000 mg | RESPIRATORY_TRACT | Status: DC | PRN

## 2018-03-14 MED ORDER — METOPROLOL TARTRATE 5 MG/5ML IV SOLN
5.0000 mg | Freq: Four times a day (QID) | INTRAVENOUS | Status: DC
  Administered 2018-03-14 – 2018-03-15 (×4): 5 mg via INTRAVENOUS
  Filled 2018-03-14 (×4): qty 5

## 2018-03-14 MED ORDER — LORATADINE 10 MG PO TABS
10.0000 mg | ORAL_TABLET | Freq: Every day | ORAL | Status: DC
  Administered 2018-03-14 – 2018-03-18 (×5): 10 mg via ORAL
  Filled 2018-03-14 (×7): qty 1

## 2018-03-14 MED ORDER — HEPARIN BOLUS VIA INFUSION
2100.0000 [IU] | Freq: Once | INTRAVENOUS | Status: AC
  Administered 2018-03-14: 2100 [IU] via INTRAVENOUS
  Filled 2018-03-14: qty 2100

## 2018-03-14 MED ORDER — DEXTROSE 50 % IV SOLN
25.0000 mL | Freq: Once | INTRAVENOUS | Status: AC
  Administered 2018-03-14: 25 mL via INTRAVENOUS

## 2018-03-14 MED ORDER — LEVALBUTEROL HCL 0.63 MG/3ML IN NEBU: 0.6300 mg | INHALATION_SOLUTION | RESPIRATORY_TRACT | Status: DC | PRN

## 2018-03-14 MED ORDER — FLUCONAZOLE 100MG IVPB
100.0000 mg | INTRAVENOUS | Status: DC
  Filled 2018-03-14: qty 50

## 2018-03-14 MED ORDER — HEPARIN (PORCINE) IN NACL 100-0.45 UNIT/ML-% IJ SOLN
1150.0000 [IU]/h | INTRAMUSCULAR | Status: DC
  Administered 2018-03-14: 1150 [IU]/h via INTRAVENOUS
  Administered 2018-03-14: 900 [IU]/h via INTRAVENOUS
  Filled 2018-03-14 (×2): qty 250

## 2018-03-14 MED ORDER — ASPIRIN 81 MG PO CHEW
81.0000 mg | CHEWABLE_TABLET | Freq: Every day | ORAL | Status: DC
  Administered 2018-03-14 – 2018-03-18 (×5): 81 mg via ORAL
  Filled 2018-03-14 (×7): qty 1

## 2018-03-14 MED ORDER — ADULT MULTIVITAMIN W/MINERALS CH
1.0000 | ORAL_TABLET | Freq: Every day | ORAL | Status: DC
  Administered 2018-03-14 – 2018-03-18 (×5): 1 via ORAL
  Filled 2018-03-14 (×7): qty 1

## 2018-03-14 MED ORDER — HYDRALAZINE HCL 20 MG/ML IJ SOLN: 10.0000 mg | Freq: Four times a day (QID) | INTRAMUSCULAR | Status: DC | PRN

## 2018-03-14 MED ORDER — FLUCONAZOLE IN SODIUM CHLORIDE 200-0.9 MG/100ML-% IV SOLN
200.0000 mg | Freq: Once | INTRAVENOUS | Status: AC
  Administered 2018-03-14: 200 mg via INTRAVENOUS
  Filled 2018-03-14: qty 100

## 2018-03-14 MED ORDER — HEPARIN (PORCINE) IN NACL 100-0.45 UNIT/ML-% IJ SOLN: 1300.0000 [IU]/h | INTRAMUSCULAR | Status: DC

## 2018-03-14 MED ORDER — SODIUM CHLORIDE 0.9 % IV SOLN
INTRAVENOUS | Status: DC
  Administered 2018-03-14 – 2018-03-16 (×3): via INTRAVENOUS

## 2018-03-14 NOTE — Progress Notes (Signed)
CRITICAL VALUE ALERT  Critical Value: Troponin 1.11  Date & Time Notied:  1715 Provider Notified: [email or web address]  Orders Received/Actions taken: will continue to monitor pt.

## 2018-03-14 NOTE — Progress Notes (Signed)
PROGRESS NOTE    Jeremy Gill  ZOX:[id number] DOB: August 29, 1945 DOA: 03/13/2018 PCP: [place], [name], MD    Brief Narrative:   Jeremy Gill is a 72 y.o. male with medical history significant of DM with neuropathy, HTN, HLD, BPH who is being brought to the hospital by family due to confusion.  Patient is alert however quite confused, fidgety, and cannot contribute much to the story.  I have discussed with the patient's wife who is at bedside.  She tells me that over the past couple weeks, he has been slightly more confused than his baseline.  She suspects he may have a degree of underlying dementia.  He was hospitalized in August of this year with acute encephalopathy, and improved however still at times even at home when he is at baseline he appears somewhat confused.  She also reports that over the last 2 weeks he has been having a reversal of sleep wake pattern, staying up all night and sleeping during the day.  She denies patient having a fever/chills/feeling hot.  There are no reported abdominal pain chest pain, shortness of breath, nausea vomiting or diarrhea.  He eats "fair" per wife.  He has been complaining of increased lower back pain, he has had this pain for years however over the last month he states that his home tramadol is no longer working.  He continues to smoke a pipe and has been resistant to quitting.  Wife reported that he has a chronic cough but does not feel like it has gotten worse.  ED Course: in the ED patient is found to be septic, tachycardic, tachypneic, with increased WBC. He is confused.Labs show renal failure with Cr 1.77 from prior normal values.Troponin is 0.8, LA 3.9. CXR with concern with multifocal pneumonia. He was given Vanc/Cefepime and we were asked to admit   Assessment & Plan:   Principal Problem:   Sepsis (HCC) Active Problems:   HCAP (healthcare-associated pneumonia)   Diabetes mellitus type 2 with neurological manifestations (HCC)   Diabetic  polyneuropathy (HCC)   Essential hypertension   COPD (chronic obstructive pulmonary disease) (HCC)   Anxiety disorder, unspecified   Acute metabolic encephalopathy   Hyperlipidemia associated with type 2 diabetes mellitus (HCC)   Acute renal failure superimposed on stage 3 chronic kidney disease (HCC)   Anemia   Pressure injury of skin   Protein-calorie malnutrition, severe   Elevated troponin   Abnormal EKG   Chest pain   Oral candidiasis   Dysphagia  1 sepsis secondary to multifocal pneumonia/healthcare associated pneumonia Patient was admitted with sepsis Patient admitted with sepsis with altered mental status, chest x-ray consistent with multifocal pneumonia, patient noted to be tachycardic on admission, with a leukocytosis, elevated lactic acid level.  Urinalysis nitrite negative, leukocytes negative.  Blood cultures obtained and are pending.  MRSA PCR negative.  Urine Legionella antigen pending.  Urine pneumococcus antigen pending.  Continue empiric IV cefepime and IV vancomycin.  Add Mucinex and scheduled nebulizers and Claritin.  Supportive care.  Follow.  2.  Elevated troponin/chest pain/EKG changes On admission patient was noted to have a point-of-care troponin at 0.83.  Cardiac enzymes were cycled with troponins rising to as high as 1.64.  Patient did have some chest pain early on this morning which has since resolved.  EKG done showed some ST depressions in leads V3 through V6.  Elevated troponin EKG changes may be secondary to demand ischemia however patient with history of COPD, diabetes, hyperlipidemia are concerning  for possible acute coronary syndrome.  Check a 2D echo.  Repeat EKG.  Continue serial enzymes.  Check a fasting lipid panel.  Patient was started on heparin drip overnight.  Change oral Toprol to IV Lopressor due to concerns for dysphasia.  Continue Lipitor.  Consult with cardiology for further evaluation and management.  3.  Acute metabolic encephalopathy Likely  secondary to problem #1.  Improving slowly.  Patient has been pancultured.  Continue empiric IV antibiotics.  4.  Oral candidiasis IV fluconazole 200 mg x 1 today and then Diflucan 100 mg IV daily x7 to 10 days.  Oral nystatin swish and swallow.  5.  Dysphagia Patient with some complaints of dysphagia.  Per wife patient wincing when he tries to swallow.  Patient however denies any food getting stuck.  Patient denies any significant odynophagia.  Patient however is noted to have oral thrush.  Place on IV fluconazole.  Keep n.p.o. for now until patient has been evaluated by speech therapy.  Follow.  6.  COPD Stable.  Place on Claritin and scheduled nebulizer treatments.  Add Dulera.  7.  Acute kidney injury In the setting of sepsis and poor oral intake.  Continue IV fluids.  Monitor renal function.  8.  Type 2 diabetes mellitus with diabetic neuropathy Hemoglobin A1c was 9.1 on January 14, 2018.  CBG this morning was 89.  Patient currently n.p.o. due to difficulty with swallowing.  Continue to hold oral hypoglycemic agents.  Discontinue Lantus.  Continue sliding scale insulin.  9.  Hypertension Antihypertensive medications on hold.  IV Lopressor.  10.  Presumed dementia Per admitting physician per wife's report with unclear baseline.  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  Will likely need outpatient follow-up.  11.  Protein calorie malnutrition Patient with poor oral input.  Patient with complaints of some dysphagia with noted oral candidiasis.  Patient also cachectic on admission.  Albumin level of 2.3.  Will likely need nutritional supplementation.  12.  Multiple pressure injuries to right elbow, right medial heel, sacrum with deep tissue pressure injury Patient seen by wound care nurse.  Wound care  recommendations made which we will continue.  Patient requires inpatient admission due to presentation with sepsis secondary to multifocal healthcare associated pneumonia requiring empiric IV antibiotics.  Patient also noted to have elevated troponins with EKG changes and chest pain concerning for possible acute coronary syndrome versus demand ischemia.  Patient was started on IV heparin drip.  Cardiology consultation pending.  Patient is going to be requiring more than 2 inpatient days and currently on empiric IV antibiotics and empiric treatment.   DVT prophylaxis: Heparin drip Code Status: Full Family Communication: Updated patient, wife, mother-in-law at bedside. Disposition Plan: Remain in the stepdown unit.   Consultants:   Cardiology pending  Procedures:   CT head 03/13/2018  Chest x-ray 03/13/2018    Antimicrobials:   IV cefepime 03/13/2018  IV vancomycin 03/13/2018   Subjective: Patient sitting up in bed alert and oriented to self place and time.  Seems to be processing slowly.  Stated had some chest pain earlier on which has since resolved.  Denies any significant shortness of breath.  Feeling a little bit better since admission however not close to baseline.  Complaining of some difficulty swallowing.  Denies any significant odynophagia however wife states wincing  when swallowing.  Denies food getting stuck.  No bleeding noted.  Events overnight noted with increasing troponin levels with EKG changes noted.  Objective: Vitals:   03/14/18 0600 03/14/18 0700 03/14/18 0800 03/14/18 0900  BP: 140/72 138/60 (!) 146/76 (!) 164/89  Pulse: (!) 113 (!) 113 (!) 113 (!) 110  Resp: 17 (!) 21 16   Temp:   98.1 F (36.7 C)   TempSrc:   Oral   SpO2: 100% 97% 98% 100%  Weight:      Height:        Intake/Output Summary (Last 24 hours) at 03/14/2018 0953 Last data filed at 03/14/2018 0800 Gross per 24 hour  Intake 1944.43 ml  Output 2125 ml  Net -180.57 ml   Filed  Weights   03/13/18 1247 03/13/18 1600  Weight: 68 kg 71.6 kg    Examination:  General exam: Appears calm and comfortable.  Dry mucous membranes.  Cachectic. Oropharynx: Oral thrush noted.  Poor dentition. Respiratory system: Some diffuse coarse breath sounds.  No wheezing.  No crackles noted.  Respiratory effort normal. Cardiovascular system: Tachycardia.  No JVD, murmurs, rubs, gallops or clicks.  Trace to 1+ bilateral lower extremity edema. Gastrointestinal system: Abdomen is nondistended, soft and nontender. No organomegaly or masses felt. Normal bowel sounds heard. Central nervous system: Alert and oriented. No focal neurological deficits. Extremities: Symmetric 5 x 5 power. Skin: No rashes, lesions or ulcers Psychiatry: Judgement and insight appear poor to fair. Mood & affect appropriate.     Data Reviewed: I have personally reviewed following labs and imaging studies  CBC: Recent Labs  Lab 03/13/18 1317 03/14/18 0334  WBC 12.8* 11.7*  NEUTROABS 10.5*  --   HGB 10.5* 9.4*  HCT 32.6* 28.4*  MCV 94.8 93.1  PLT 263 255   Basic Metabolic Panel: Recent Labs  Lab 03/13/18 1317 03/14/18 0334  NA 142 142  K 5.0 3.9  CL 104 109  CO2 25 25  GLUCOSE 218* 87  BUN 52* 41*  CREATININE 1.77* 1.27*  CALCIUM 8.7* 8.2*   GFR: Estimated Creatinine Clearance: 53.2 mL/min (A) (by C-G formula based on SCr of 1.27 mg/dL (H)). Liver Function Tests: Recent Labs  Lab 03/13/18 1317 03/14/18 0334  AST 25 19  ALT 16 14  ALKPHOS 43 36*  BILITOT 1.2 0.9  PROT 6.7 5.9*  ALBUMIN 2.8* 2.3*   No results for input(s): LIPASE, AMYLASE in the last 168 hours. No results for input(s): AMMONIA in the last 168 hours. Coagulation Profile: Recent Labs  Lab 03/13/18 1317  INR 1.17   Cardiac Enzymes: Recent Labs  Lab 03/13/18 1317 03/13/18 1715 03/13/18 2128 03/14/18 0334  CKTOTAL 94  --   --   --   TROPONINI  --  1.37* 1.64* 1.63*   BNP (last 3 results) No results for  input(s): PROBNP in the last 8760 hours. HbA1C: No results for input(s): HGBA1C in the last 72 hours. CBG: Recent Labs  Lab 03/13/18 1627 03/13/18 2127 03/14/18 0737 03/14/18 0827  GLUCAP 170* 127* 60* 89   Lipid Profile: No results for input(s): CHOL, HDL, LDLCALC, TRIG, CHOLHDL, LDLDIRECT in the last 72 hours. Thyroid Function Tests: No results for input(s): TSH, T4TOTAL, FREET4, T3FREE, THYROIDAB in the last 72 hours. Anemia Panel: No results for input(s): VITAMINB12, FOLATE, FERRITIN, TIBC, IRON, RETICCTPCT in the last 72 hours. Sepsis Labs: Recent Labs  Lab 03/13/18 1314 03/13/18 1715  LATICACIDVEN 3.92* 2.3*    Recent Results (from the past 240 hour(s))  Culture, blood (Routine x 2)     Status: None (Preliminary result)   Collection Time: 03/13/18  1:14 PM  Result Value Ref Range Status   Specimen Description   Final    BLOOD RIGHT FOREARM Performed at [facility], [address], [place], [place] [address]    Special Requests   Final    BOTTLES DRAWN AEROBIC AND ANAEROBIC Blood Culture adequate volume Performed at [facility], [address] [place], [place] [address]    Culture   Final    NO GROWTH < 24 HOURS Performed at [facility], [address] [place], [place] [address]    Report Status PENDING  Incomplete  Culture, blood (Routine x 2)     Status: None (Preliminary result)   Collection Time: 03/13/18  1:17 PM  Result Value Ref Range Status   Specimen Description   Final    BLOOD LEFT ANTECUBITAL Performed at [facility], [address], [place], [place] [address]    Special Requests   Final    BOTTLES DRAWN AEROBIC AND ANAEROBIC Blood Culture adequate volume Performed at [facility], [address], [place], [place] [address]    Culture   Final    NO GROWTH < 24 HOURS Performed at [facility], [address] [place], [place] [address]    Report  Status PENDING  Incomplete  MRSA PCR Screening     Status: None   Collection Time: 03/13/18  6:41 PM  Result Value Ref Range Status   MRSA by PCR NEGATIVE NEGATIVE Final    Comment:        The GeneXpert MRSA Assay (FDA approved for NASAL specimens only), is one component of a comprehensive MRSA colonization surveillance program. It is not intended to diagnose MRSA infection nor to guide or monitor treatment for MRSA infections. Performed at [facility], [address], [place], [place] [address]          Radiology Studies: Dg Chest 2 View  Result Date: 03/13/2018 CLINICAL DATA:  Possible sepsis EXAM: CHEST - 2 VIEW COMPARISON:  Chest x-ray of August 1929 FINDINGS: The lungs are mildly hyperinflated with hemidiaphragm flattening. There is a new dense infiltrate in the left lower lobe posteriorly. There are patchy airspace opacities elsewhere in the left lung and to a lesser extent in the right lung worrisome for septic emboli. The heart and pulmonary vascularity are normal. The mediastinum is normal in width. There is calcification in the wall of the aortic arch. IMPRESSION: COPD. Left lower lobe pneumonia. Patchy airspace opacities elsewhere in the left lung and to a lesser extent on the right worrisome for multifocal pneumonia or septic emboli. Electronically Signed   By: David  [place] M.D.   On: 03/13/2018 13:53   Ct Head Wo Contrast  Result Date: 03/13/2018 CLINICAL DATA:  72 year old male with unexplained altered mental status. EXAM: CT HEAD WITHOUT CONTRAST TECHNIQUE: Contiguous axial images were obtained from the base of the skull through the vertex without intravenous contrast. COMPARISON:  Brain MRI 01/14/2018, head CT 01/13/2018 and earlier. FINDINGS: Brain: Chronic confluent cerebral white matter hypodensity, moderate to severe heterogeneity in the left deep gray matter nuclei, and pons. Stable gray-white matter differentiation throughout the brain. No  cortical encephalomalacia identified. No midline shift, ventriculomegaly, mass effect, evidence of mass lesion, intracranial hemorrhage or evidence of cortically based acute infarction. Vascular: Calcified atherosclerosis at the skull base. No suspicious intracranial  vascular hyperdensity. Skull: Negative. Sinuses/Orbits: Visualized paranasal sinuses and mastoids are stable and well pneumatized. Other: Stable orbit and scalp soft tissues. IMPRESSION: No acute intracranial abnormality. Stable non contrast CT appearance of the brain with advanced chronic small vessel disease. Electronically Signed   By: Odessa Fleming M.D.   On: 03/13/2018 14:14        Scheduled Meds: . atorvastatin  40 mg Oral Daily  . DULoxetine  60 mg Oral Daily  . feeding supplement (ENSURE ENLIVE)  1 Bottle Oral TID BM  . gabapentin  300 mg Oral TID  . insulin aspart  0-9 Units Subcutaneous TID WC  . insulin glargine  10 Units Subcutaneous QHS  . metoprolol tartrate  5 mg Intravenous Q6H  . nystatin  5 mL Oral QID  . polyethylene glycol  17 g Oral Daily  . tamsulosin  0.4 mg Oral QPC breakfast   Continuous Infusions: . sodium chloride 75 mL/hr at 03/14/18 0527  . ceFEPime (MAXIPIME) IV Stopped (03/14/18 0354)  . [START ON 03/15/2018] fluconazole (DIFLUCAN) IV    . fluconazole (DIFLUCAN) IV    . heparin 900 Units/hr (03/14/18 0131)  . vancomycin       LOS: 0 days    Time spent: 45 minutes    Ramiro Harvest, MD [facility] Pager [phone]  If 7PM-7AM, please contact night-coverage [email or web address] Password [email or web address] 03/14/2018, 9:53 AM

## 2018-03-14 NOTE — Progress Notes (Signed)
Hypoglycemic Event  CBG: 66  Treatment: 25 mL dextrose amp  Symptoms: confusion, tired Follow-up CBG: Time:01306 CBG Result: 91  Possible Reasons for Event:not eating  Comments/MD notified:will continue to monitor    Jeremy Gill

## 2018-03-14 NOTE — Progress Notes (Signed)
ANTICOAGULATION CONSULT NOTE - Initial Consult  Pharmacy Consult for IV heparin Indication: chest pain/ACS  No Known Allergies  Patient Measurements: Height: 6' (182.9 cm) Weight: 157 lb 13.6 oz (71.6 kg) IBW/kg (Calculated) : 77.6 Heparin Dosing Weight: actual  Vital Signs: Temp: 99.7 F (37.6 C) (10/18 0000) Temp Source: Oral (10/18 0000) BP: 137/69 (10/17 1900) Pulse Rate: 113 (10/17 1943)  Labs: Recent Labs    03/13/18 1317 03/13/18 1715 03/13/18 2128  HGB 10.5*  --   --   HCT 32.6*  --   --   PLT 263  --   --   LABPROT 14.8  --   --   INR 1.17  --   --   CREATININE 1.77*  --   --   CKTOTAL 94  --   --   TROPONINI  --  1.37* 1.64*    Estimated Creatinine Clearance: 38.2 mL/min (A) (by C-G formula based on SCr of 1.77 mg/dL (H)).   Medical History: Past Medical History:  Diagnosis Date  . ANKLE SPRAIN 12/16/2006   Qualifier: Diagnosis of  By: Tawanna Cooler MD, Eugenio Hoes   . ANXIETY 10/25/2006  . CELLULITIS, LEG, RIGHT 09/08/2007   Qualifier: Diagnosis of  By: Tawanna Cooler MD, Eugenio Hoes   . Chronic pain disorder 11/30/2015  . COPD 10/25/2006  . 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 DIABETES MELLITUS, TYPE I 10/25/2006  . Diabetic polyneuropathy (HCC) 04/08/2007   Qualifier: Diagnosis of  By: Tawanna Cooler MD, Eugenio Hoes   . 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 Essential hypertension 10/25/2006   Qualifier: Diagnosis of  By: Rosette Reveal RN, Jorene Minors   . Generalized osteoarthritis of multiple sites 11/30/2015  . HYPERTENSION 10/25/2006  . 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 Low back pain with radiation 01/23/2016  . Polyneuropathy in diabetes(357.2) 04/08/2007  . Psoriasis   . TOBACCO ABUSE 07/08/2007  . VENOUS INSUFFICIENCY 11/25/2007    Medications:  Scheduled:  . atorvastatin  40 mg Oral Daily  . DULoxetine  60 mg Oral Daily  . enoxaparin  (LOVENOX) injection  40 mg Subcutaneous Q24H  . feeding supplement (ENSURE ENLIVE)  1 Bottle Oral TID BM  . gabapentin  300 mg Oral TID  . insulin aspart  0-9 Units Subcutaneous TID WC  . insulin glargine  10 Units Subcutaneous QHS  . metoprolol succinate  12.5 mg Oral Daily  . polyethylene glycol  17 g Oral Daily  . tamsulosin  0.4 mg Oral QPC breakfast   Infusions:  . sodium chloride 75 mL/hr at 03/13/18 1620  . ceFEPime (MAXIPIME) IV    . vancomycin      Assessment: 72 yoM admitted with fever, weakness possible fall. Now with elevated troponin 1.37>1.64.  IV heparin for ACS.  Baseline labs: H/H= 10.5/32.6, Plts = 263, INR = 1.17, aptt pending  Goal of Therapy:  Heparin level 0.3-0.7 units/ml Monitor platelets by anticoagulation protocol: Yes   Plan:  Baseline aptt STAT Patient received Lovenox 40 mg 10/17 2142 Give Hepairn 2000 unit bolus IV x1 (reduced d/t above Lovenox dose) Start heparin drip at 900 units/hr Daily CBC/HL Check 1st HL in 8 hour  Susanne Greenhouse R 03/14/2018,1:21 AM

## 2018-03-14 NOTE — Consult Note (Signed)
   [facility] CM Inpatient Consult   03/14/2018  DRAKKAR MEDEIROS 1946-01-03 [id number]    Made aware of hospitalization by ED RNCM. [facility] Care Management has outreached patient multiple times, prior to hospitalization, without success.  Went to bedside to speak with Mr. Vanloan about [facility] Care Management program. However, he was about to have echo done. Wife was not present. Patient also remains in stepdown unit at [facility].   Will follow up at later time.   [name], MSN-Ed, RN,BSN [facility] Liaison [phone]

## 2018-03-14 NOTE — Progress Notes (Signed)
Bse done, full report to follow.  Pt self reports problems choking on liquids prior to admission.  ? Decreased cognition.  Poor dentition.  Subtle throat clearing and cough with thin.  No indication of airway compromise with nectar or ice but suspect delayed oral transiting across the board (? Cognitive based).   Recommend dys3/ground meats/nectar and allow ice chips.  Medicine with puree.  SlP to follow up. Jeremy Burnet, MS [facility] SLP Acute Rehab Services Pager [phone] Office [phone]

## 2018-03-14 NOTE — Discharge Instructions (Signed)
Patient Discharge   Jeremy Gill / [id number] DOB: March 12, 1946   Admitted 03/13/2018 Discharged: 03/14/2018    Scheduled Meds:  aspirin  81 mg Oral Daily   atorvastatin  40 mg Oral Daily   DULoxetine  60 mg Oral Daily   feeding supplement (ENSURE ENLIVE)  1 Bottle Oral TID BM   gabapentin  300 mg Oral TID   guaiFENesin  1,200 mg Oral BID   insulin aspart  0-9 Units Subcutaneous TID WC   ipratropium  0.5 mg Nebulization Q6H   levalbuterol  0.63 mg Nebulization Q6H   loratadine  10 mg Oral Daily   metoprolol tartrate  5 mg Intravenous Q6H   mometasone-formoterol  2 puff Inhalation BID   multivitamin with minerals  1 tablet Oral Daily   nystatin  5 mL Oral QID   polyethylene glycol  17 g Oral Daily   tamsulosin  0.4 mg Oral QPC breakfast   Continuous Infusions:  ceFEPime (MAXIPIME) IV Stopped (03/14/18 1608)   dextrose 5 % and 0.9% NaCl 100 mL/hr at 03/14/18 1338   [START ON 03/15/2018] fluconazole (DIFLUCAN) IV     heparin 1,150 Units/hr (03/14/18 1714)   vancomycin Stopped (03/14/18 1800)   PRN Meds:hydrALAZINE, ipratropium, levalbuterol, RESOURCE THICKENUP CLEAR, traMADol Personal Items   Please collect all clothing which belongs to you from your nurse. Please collect any valuables you stored during your stay from the front desk, and please remember all of your personal items, such as dentures, canes, and eyeglasses.   Activity Instructions   You must avoid lifting more than *** pounds until your physician instructs you differently. You should avoid {d/c avoid/resume:120111}. You may resume {d/c avoid/resume:120111}.   I understand that if any problems occur once I am at home I am to contact my physician.  I understand and acknowledge receipt of the instructions indicated above.    _____________________________________________                                                       Physician's or R.N.'s Signature                Date/Time                   _____________________________________________                                                       Patient or Representative Signature         Date/Time

## 2018-03-14 NOTE — Consult Note (Signed)
WOC Nurse wound consult note Reason for Consult: multiple pressure injuries Wound type: Right elbow; trauma/fall? Injury at home Right medial heel: Deep tissue pressure injury Sacrum: Deep tissue pressure injury Pressure Injury POA: Yes Measurement: Right elbow: 0.2cm x 0.2cm x 0cm Right medial heel: 3cm x 2cm x 0.1cm  Sacrum: 2cm x 0.5cm x 0cm  Wound bed: Right elbow: 100% scabbed  Right medial heel: dark purple non blanchable tissue with superficial skin loss Sacrum: 100% dark purple non blanchable tissue, intact skin  Drainage (amount, consistency, odor) minimal from the heel ulcer, serosanguinous, no odor Periwound: intact  Dressing procedure/placement/frequency: Continue foam dressings that were implemented with the skin care order set. Patient is moving legs and arms quite a bit in the bed will not order Prevalon boot at this time Progressa bed in place while in the ICU  Discussed POC with patient and bedside nurse.  Re consult if needed, will not follow at this time. Thanks  Taniaya Rudder [employer], RN,CWOCN, CNS, CWON-AP [phone])

## 2018-03-14 NOTE — Progress Notes (Signed)
  Echocardiogram 2D Echocardiogram has been performed.  Jeremy Gill 03/14/2018, 2:15 PM

## 2018-03-14 NOTE — Progress Notes (Signed)
Pharmacy Antibiotic Note  Jeremy Gill is a 72 y.o. male admitted on 03/13/2018 with pneumonia.  Pharmacy has been consulted for vancomycin and cefepiome dosing.  03/14/18  SCr significantly improved with hydration  MD wants to continue vancomycin for now despite negative MRSA PCR  Plan: Increase cefepime dosing to 2 g iv q 12 hours.   Increase vancomycin dosing to 1000 mg iv q 24h. AUC 410, IBW/TBW. CSS min 9 (will increase if plan to continue tomorrow).   - F/U renal function, cultures - F/U plans for abx  Height: 6' (182.9 cm) Weight: 157 lb 13.6 oz (71.6 kg) IBW/kg (Calculated) : 77.6  Temp (24hrs), Avg:98.7 F (37.1 C), Min:98.1 F (36.7 C), Max:99.7 F (37.6 C)  Recent Labs  Lab 03/13/18 1314 03/13/18 1317 03/13/18 1715 03/14/18 0334 03/14/18 0928  WBC  --  12.8*  --  11.7*  --   CREATININE  --  1.77*  --  1.27*  --   LATICACIDVEN 3.92*  --  2.3*  --  0.8    Estimated Creatinine Clearance: 53.2 mL/min (A) (by C-G formula based on SCr of 1.27 mg/dL (H)).    No Known Allergies  Antimicrobials this admission: cefepime 10/17 >>  vancomycin 10/17 >>   Dose adjustments this admission: 10/17: increase cefepime 2 g q12h            Increase vancomycin 1g q24h  Microbiology results: 10/17 BCx: NGTD 10/17 UCx:   Sputum:   10/17 MRSA PCR: negative  Thank you for allowing pharmacy to be a part of this patient's care.  Luisa Hart D 03/14/2018 1:10 PM

## 2018-03-14 NOTE — Consult Note (Addendum)
Cardiology Consultation:   Patient ID: Jeremy Gill; [id number]; 12/23/1945   Admit date: 03/13/2018 Date of Consult: 03/14/2018  Primary Care Provider: [place], [name], MD Primary Cardiologist: Dr. Eldridge Dace   Patient Profile:   Jeremy Gill is a 72 y.o. male with a hx of HTN, DM2, tobacco use and HLD who is being seen today for the evaluation of chest pain and abnormal EKG at the request of Dr. [name].  History of Present Illness:   Jeremy Gill is a 72 year old male with a history stated above who presented to [facility] on 03/13/2018 with altered mental status.  History obtained from family and chart review as patient continues to have issues with ongoing confusion. According to family, over the past several weeks patient has suffered from increasing confusion with suspicion of progression of underlying dementia. He was hospitalized 12/2017 with acute encephalopathy with improvement however continues to struggle with baseline confusion at home since that time. Wife at bedside states that over the past 2 weeks he has been sleeping more and stays in the bed most of the day. At the time of presentation he denied chest pain, shortness of breath, recent illness including fever, chills, diaphoresis, nausea, vomiting, dizziness, presyncopal or syncopal episodes. Wife reports he has been complaining of increased lower back pain however this is a chronic concern, likely in the setting of poor posture. He continues to smoke a pipe daily and is resistant to quitting. He has had a chronic ongoing cough which his wife feels that is become worse.  In the ED, he was found to be septic, tachycardic and tachypneic with an elevated WBC.  His creatinine was elevated at 1.77.  His troponin was elevated at 0.8 with a lactic acid of 3.9.  CXR completed with concern for multifocal pneumonia in which he was started on Vanco and cefepime.  Upon arrival to the floor, the patrient was noted to be tachycardic  with HR in the 120's with complaints of chest pain. EKG was performed which showed new ST depression in leads V3-V6, new this admission. Troponin levels were found to be elevated at 1.37>1.64>1.63>1.23. Pt was started on Hep gtt and Cardiology was consulted.   Of note, patient was last seen by Dr.Varanasi in the office on 11/26/2016.  At that time he had complaints of chest pressure and palpitations with plans for nuclear stress test which was performed on 12/04/2016. This was found to be low risk with EF of 57% and no ST segment deviation.  He has not been seen since this time.  Past Medical History:  Diagnosis Date  . ANKLE SPRAIN 12/16/2006   Qualifier: Diagnosis of  By: Tawanna Cooler MD, Eugenio Hoes   . ANXIETY 10/25/2006  . CELLULITIS, LEG, RIGHT 09/08/2007   Qualifier: Diagnosis of  By: Tawanna Cooler MD, Eugenio Hoes   . Chronic pain disorder 11/30/2015  . COPD 10/25/2006  . 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 DIABETES MELLITUS, TYPE I 10/25/2006  . Diabetic polyneuropathy (HCC) 04/08/2007   Qualifier: Diagnosis of  By: Tawanna Cooler MD, Eugenio Hoes   . 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 Essential hypertension 10/25/2006   Qualifier: Diagnosis of  By: Rosette Reveal RN, Jorene Minors   . Generalized osteoarthritis of multiple sites 11/30/2015  . HYPERTENSION 10/25/2006  . 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 Low back  pain with radiation 01/23/2016  . Polyneuropathy in diabetes(357.2) 04/08/2007  . Psoriasis   . TOBACCO ABUSE 07/08/2007  . VENOUS INSUFFICIENCY 11/25/2007    Past Surgical History:  Procedure Laterality Date  . TESTICLE REMOVAL     R testicle     Prior to Admission medications   Medication Sig Start Date End Date Taking? Authorizing Provider  ACCU-CHEK SOFTCLIX LANCETS lancets Use as instructed 01/08/18  Yes [place], [name], MD  aspirin 81 MG chewable tablet Chew 81  mg by mouth 2 (two) times a week. Pt takes on Monday and Friday.   Yes [provider]  atorvastatin (LIPITOR) 40 MG tablet Take 1 tablet (40 mg total) by mouth daily. 12/23/17 03/23/18 Yes [place], [name], MD  baclofen (LIORESAL) 10 MG tablet TAKE 1 TABLET BY MOUTH 3 TIMES DAILY 02/05/18  Yes Marcello Fennel, MD  collagenase (SANTYL) ointment Apply 1 application topically daily. Patient taking differently: Apply 1 application topically daily. To Right foot. 09/16/17  Yes Felecia Shelling, DPM  diclofenac (VOLTAREN) 50 MG EC tablet Take 1 tablet (50 mg total) by mouth 3 (three) times daily. 02/05/18  Yes Marcello Fennel, MD  DULoxetine (CYMBALTA) 60 MG capsule TAKE 1 CAPSULE BY MOUTH EVERY DAY 03/05/18  Yes Patel, Maryln Gottron, MD  feeding supplement, ENSURE ENLIVE, (ENSURE ENLIVE) LIQD Take 237 mLs by mouth 3 (three) times daily between meals. 01/17/18  Yes Mikhail, Maryann, DO  gabapentin (NEURONTIN) 600 MG tablet Take 1 tablet (600 mg total) by mouth 3 (three) times daily. 10/04/17  Yes Sheikh, Omair Latif, DO  Insulin Glargine (LANTUS SOLOSTAR) 100 UNIT/ML Solostar Pen Inject 20-25 units into skin at 10 pm daily. If blood sugar is over 200, increase by 5 units every 1 to 2 weeks. 01/10/18  Yes Gordy Savers, MD  Insulin Syringe-Needle U-100 (INSULIN SYRINGE .5CC/31GX5/16") 31G X 5/16" 0.5 ML MISC USE AS DIRECTED WITH LANTUS 01/08/18  Yes [place], [name], MD  ipratropium-albuterol (DUONEB) 0.5-2.5 (3) MG/3ML SOLN Take 3 mLs by nebulization every 6 (six) hours as needed. Patient taking differently: Take 3 mLs by nebulization every 6 (six) hours as needed (heophebreathing).  10/04/17  Yes Sheikh, Omair Latif, DO  metFORMIN (GLUCOPHAGE) 500 MG tablet Take 2 tablets (1,000 mg total) by mouth 2 (two) times daily with a meal. 11/26/17  Yes [place], [name], MD  metoprolol succinate (TOPROL-XL) 25 MG 24 hr tablet Take 0.5 tablets (12.5 mg total) by mouth daily. 02/06/17  Yes [place], [name], MD    [facility] VERIO test strip Use to test blood sugar three-four times daily. 03/05/18  Yes [place], [name], MD  polyethylene glycol [facility]) packet Take 17 g by mouth daily. 01/18/18  Yes Mikhail, Nita Sells, DO  tamsulosin (FLOMAX) 0.4 MG CAPS capsule Take 1 capsule (0.4 mg total) by mouth daily after breakfast. NEED OV. Patient taking differently: Take 0.4 mg by mouth daily as needed (urinary output.). NEED OV. 02/12/18  Yes [place], Peter M, MD  traMADol (ULTRAM) 50 MG tablet Take 1 tablet (50 mg total) by mouth every 6 (six) hours as needed. for pain 02/20/18  Yes Marcello Fennel, MD  triamcinolone ointment (KENALOG) 0.1 % Apply 1 application topically 2 (two) times daily as needed (rash). Compounded as a 1:1 mixture with Eucerin. 02/05/18  Yes [place], [name], MD    Inpatient Medications: Scheduled Meds: . atorvastatin  40 mg Oral Daily  . DULoxetine  60 mg Oral Daily  . feeding  supplement (ENSURE ENLIVE)  1 Bottle Oral TID BM  . gabapentin  300 mg Oral TID  . insulin aspart  0-9 Units Subcutaneous TID WC  . insulin glargine  10 Units Subcutaneous QHS  . metoprolol tartrate  5 mg Intravenous Q6H  . nystatin  5 mL Oral QID  . polyethylene glycol  17 g Oral Daily  . tamsulosin  0.4 mg Oral QPC breakfast   Continuous Infusions: . sodium chloride 100 mL/hr at 03/14/18 1009  . ceFEPime (MAXIPIME) IV Stopped (03/14/18 0354)  . [START ON 03/15/2018] fluconazole (DIFLUCAN) IV    . fluconazole (DIFLUCAN) IV 200 mg (03/14/18 1021)  . heparin 900 Units/hr (03/14/18 0131)  . vancomycin     PRN Meds: ipratropium-albuterol, traMADol  Allergies:   No Known Allergies  Social History:   Social History   Socioeconomic History  . Marital status: Married    Spouse name: Not on file  . Number of children: Not on file  . Years of education: Not on file  . Highest education level: Not on file  Occupational History  . Occupation: Self Employed  Social Needs  . Financial resource  strain: Not on file  . Food insecurity:    Worry: Not on file    Inability: Not on file  . Transportation needs:    Medical: Not on file    Non-medical: Not on file  Tobacco Use  . Smoking status: Current Every Day Smoker    Types: Pipe    Last attempt to quit: 05/29/2011    Years since quitting: 6.7  . Smokeless tobacco: Never Used  Substance and Sexual Activity  . Alcohol use: No  . Drug use: No  . Sexual activity: Not on file  Lifestyle  . Physical activity:    Days per week: Not on file    Minutes per session: Not on file  . Stress: Not on file  Relationships  . Social connections:    Talks on phone: Not on file    Gets together: Not on file    Attends religious service: Not on file    Active member of club or organization: Not on file    Attends meetings of clubs or organizations: Not on file    Relationship status: Not on file  . Intimate partner violence:    Fear of current or ex partner: Not on file    Emotionally abused: Not on file    Physically abused: Not on file    Forced sexual activity: Not on file  Other Topics Concern  . Not on file  Social History Narrative   Regular exercise-no    Family History:   Family History  Problem Relation Age of Onset  . Cancer Mother        Unknown   . Diabetes Sister    Family Status:  Family Status  Relation Name Status  . Mother  Deceased at age 16       CNS aneurysm  . Father  Deceased at age 22       unknown case  . Brother  Deceased       old age  . Brother  Deceased       Homicide  . Sister  Deceased       old age  . Sister  (Not Specified)    ROS:  Please see the history of present illness.  All other ROS reviewed and negative.     Physical Exam/Data:   Vitals:  03/14/18 0600 03/14/18 0700 03/14/18 0800 03/14/18 0900  BP: 140/72 138/60 (!) 146/76 (!) 164/89  Pulse: (!) 113 (!) 113 (!) 113 (!) 110  Resp: 17 (!) 21 16   Temp:   98.1 F (36.7 C)   TempSrc:   Oral   SpO2: 100% 97% 98% 100%   Weight:      Height:        Intake/Output Summary (Last 24 hours) at 03/14/2018 1044 Last data filed at 03/14/2018 0800 Gross per 24 hour  Intake 1944.43 ml  Output 2125 ml  Net -180.57 ml   Filed Weights   03/13/18 1247 03/13/18 1600  Weight: 68 kg 71.6 kg   Body mass index is 21.41 kg/m.   General: Frail, elderly,  NAD Skin: Warm, dry, intact  Head: Normocephalic, atraumatic, clear, moist mucus membranes. Neck: Negative for carotid bruits. No JVD Lungs:Clear to ausculation bilaterally. No wheezes, rales, or rhonchi. Breathing is unlabored. Cardiovascular: RRR with S1 S2. No murmurs, rubs, gallops, or LV heave appreciated. Abdomen: Soft, non-tender, non-distended with normoactive bowel sounds. No obvious abdominal masses. MSK: Strength and tone appear normal for age. 5/5 in all extremities Extremities: No edema. No clubbing or cyanosis. DP/PT pulses 2+ bilaterally Neuro: Alert and oriented. No focal deficits. No facial asymmetry. MAE spontaneously. Psych: Responds to questions appropriately with normal affect.     EKG:  The EKG was personally reviewed and demonstrates: 03/14/18 ST with ST depression in leads V3-V5 Telemetry:  Telemetry was personally reviewed and demonstrates: NSR with HR 92  Relevant CV Studies:  ECHO: Pending today   CATH: None  Myoview stress test: 12/04/16:   Nuclear stress EF: 57%.  There was no ST segment deviation noted during stress.  The study is normal.  This is a low risk study.  The left ventricular ejection fraction is normal (55-65%).   Thinning of the inferior base likely from diaphragma and extra cardiac activity No ischemia EF 57% Low risk study  Laboratory Data:  Chemistry Recent Labs  Lab 03/13/18 1317 03/14/18 0334  NA 142 142  K 5.0 3.9  CL 104 109  CO2 25 25  GLUCOSE 218* 87  BUN 52* 41*  CREATININE 1.77* 1.27*  CALCIUM 8.7* 8.2*  GFRNONAA 37* 55*  GFRAA 42* >60  ANIONGAP 13 8    Total Protein  Date  Value Ref Range Status  03/14/2018 5.9 (L) 6.5 - 8.1 g/dL Final  40/98/1191 7.1 6.0 - 8.5 g/dL Final   Albumin  Date Value Ref Range Status  03/14/2018 2.3 (L) 3.5 - 5.0 g/dL Final  47/82/9562 4.4 3.5 - 4.8 g/dL Final   AST  Date Value Ref Range Status  03/14/2018 19 15 - 41 U/L Final   ALT  Date Value Ref Range Status  03/14/2018 14 0 - 44 U/L Final   Alkaline Phosphatase  Date Value Ref Range Status  03/14/2018 36 (L) 38 - 126 U/L Final   Total Bilirubin  Date Value Ref Range Status  03/14/2018 0.9 0.3 - 1.2 mg/dL Final   Bilirubin Total  Date Value Ref Range Status  02/26/2017 0.4 0.0 - 1.2 mg/dL Final   Hematology Recent Labs  Lab 03/13/18 1317 03/14/18 0334  WBC 12.8* 11.7*  RBC 3.44* 3.05*  HGB 10.5* 9.4*  HCT 32.6* 28.4*  MCV 94.8 93.1  MCH 30.5 30.8  MCHC 32.2 33.1  RDW 13.8 13.7  PLT 263 255   Cardiac Enzymes Recent Labs  Lab 03/13/18 1715 03/13/18 2128 03/14/18 0334  TROPONINI 1.37* 1.64* 1.63*    Recent Labs  Lab 03/13/18 1312  TROPIPOC 0.83*    BNPNo results for input(s): BNP, PROBNP in the last 168 hours.  DDimer No results for input(s): DDIMER in the last 168 hours. TSH:  Lab Results  Component Value Date   TSH 0.760 01/14/2018   Lipids: Lab Results  Component Value Date   CHOL 108 02/26/2017   HDL 58 02/26/2017   LDLCALC 43 02/26/2017   LDLDIRECT 141.8 09/30/2008   TRIG 36 02/26/2017   CHOLHDL 1.9 02/26/2017   HgbA1c: Lab Results  Component Value Date   HGBA1C 9.1 (H) 01/14/2018   Radiology/Studies:  Dg Chest 2 View  Result Date: 03/13/2018 CLINICAL DATA:  Possible sepsis EXAM: CHEST - 2 VIEW COMPARISON:  Chest x-ray of August 1929 FINDINGS: The lungs are mildly hyperinflated with hemidiaphragm flattening. There is a new dense infiltrate in the left lower lobe posteriorly. There are patchy airspace opacities elsewhere in the left lung and to a lesser extent in the right lung worrisome for septic emboli. The heart and  pulmonary vascularity are normal. The mediastinum is normal in width. There is calcification in the wall of the aortic arch. IMPRESSION: COPD. Left lower lobe pneumonia. Patchy airspace opacities elsewhere in the left lung and to a lesser extent on the right worrisome for multifocal pneumonia or septic emboli. Electronically Signed   By: David  [place] M.D.   On: 03/13/2018 13:53   Ct Head Wo Contrast  Result Date: 03/13/2018 CLINICAL DATA:  72 year old male with unexplained altered mental status. EXAM: CT HEAD WITHOUT CONTRAST TECHNIQUE: Contiguous axial images were obtained from the base of the skull through the vertex without intravenous contrast. COMPARISON:  Brain MRI 01/14/2018, head CT 01/13/2018 and earlier. FINDINGS: Brain: Chronic confluent cerebral white matter hypodensity, moderate to severe heterogeneity in the left deep gray matter nuclei, and pons. Stable gray-white matter differentiation throughout the brain. No cortical encephalomalacia identified. No midline shift, ventriculomegaly, mass effect, evidence of mass lesion, intracranial hemorrhage or evidence of cortically based acute infarction. Vascular: Calcified atherosclerosis at the skull base. No suspicious intracranial vascular hyperdensity. Skull: Negative. Sinuses/Orbits: Visualized paranasal sinuses and mastoids are stable and well pneumatized. Other: Stable orbit and scalp soft tissues. IMPRESSION: No acute intracranial abnormality. Stable non contrast CT appearance of the brain with advanced chronic small vessel disease. Electronically Signed   By: Odessa Fleming M.D.   On: 03/13/2018 14:14   Assessment and Plan:   1. Chest pain with elevated troponin: -Pt presented to [facility] on 03/13/2018 with altered mental status. History obtained from family and chart review as patient continues to have issues with ongoing confusion. According to family, over the past several weeks patient has suffered from increasing confusion with suspicion of  progression of underlying dementia>>on arrival pain had no complaints of CP however on 03/14/18 he had an episode of chest/epigastirc pain with EKG changes.  -EKG with ST and ST depression in V leads  -Troponin, elevated at 1.37>1.64>1.63>1.23 -Echocardiogram pending results  -Recent stress test 11/2016 considered low risk with no ischemia noted  -Given acute illness, confusion and renal dysfunction, would treat medically at this time. After stabilization, would consider cardiac catheterization at a later date. Not currently a cath candidate given the above -Continue ASA, statin, BB  2. Hypertension: -Stable, 166/94>164/89>146/76 -Continue BB  3. Acute metabolic encephalopathy in the setting of sepsis and pneumonia: -Continue vancomycin and cefepime per primary team -Awaiting cultures>>> strep pneumo and Legionella antigens added on  for assessment -Albuterol as needed, per primary team  4. Acute kidney injury: -Creatinine, 1.27 today>>0.8-1.0 at baseline  -Likely in the setting of acute illness and poor oral intake -IV fluid hydration -Follow renal function closely with daily BMET  5. DM2: -SSI for glucose control while inpatient status -Per primary team  6. Dementia: -Baseline confusion with worsening symptoms over the last 2 to 3 weeks -Questionable formal evaluation as outpatient per chart review  For questions or updates, please contact [facility] Please consult [email or web address] for contact info under Cardiology/STEMI.   SignedGeorgie Chard NP-C [facility] Pager: [phone] 03/14/2018 10:44 AM  As above, patient seen and examined.  Briefly he is a 72 year old male with past medical history of hypertension, diabetes mellitus, COPD, probable recent onset dementia admitted with pneumonia for evaluation of non-ST elevation myocardial infarction.  Note history is extremely difficult as patient is confused.  He does not know place or year.  He apparently has had low back pain  and worsening confusion at home and was brought to the emergency room.  He complained of chest pain/epigastric pain earlier which has now resolved.  He states it was not a sharp pain but cannot describe.  No radiation.  No dyspnea or nausea.  Enzymes are abnormal and cardiology asked to evaluate.  Chest ray shows left lower lobe pneumonia.  Initial BUN and creatinine 52/1.77.  Troponin I 0.64.  Hemoglobin 9.4.  Electrocardiogram shows anterolateral ST depression.  1 non-ST elevation myocardial infarction-patient denies chest pain at present.  Very difficult historian.  However electrocardiogram showed anterior lateral ST depression and his troponin is abnormal.  Would add aspirin 81 mg daily.  Continue heparin, metoprolol and statin.  Very difficult situation.  He has multiple comorbidities at present including probable dementia, pneumonia and acute renal insufficiency.  He and his wife state they would want all measures.  We will need to treat his pneumonia and hydrate/follow renal function.  If his overall condition improves including mental status would recommend cardiac catheterization.  However unclear that he will be a candidate at this point.  2 pneumonia-antibiotics per primary care.  3 acute renal insufficiency-possibly secondary to decreased p.o. intake/dehydration.  Agree with hydration and following.  4 dementia-Per his wife he has had increasing confusion at home.  At present he knows he is in [place] but does not know the name of the hospital he is in.  He thinks year is 15.  Possible worsening of mental status related to pneumonia.  Will need to follow to see if this improves.  He will need to be able to cooperate if catheterization pursued.  Olga Millers, MD

## 2018-03-14 NOTE — Progress Notes (Signed)
Hypoglycemic Event  CBG: 60  Treatment: D50  Symptoms: none  Follow-up CBG: Time:0800 CBG Result:089  Possible Reasons for Event: NPO  Comments/MD notified:aware    Chazlyn Cude

## 2018-03-14 NOTE — Progress Notes (Signed)
ANTICOAGULATION CONSULT NOTE - Follow Up Consult  Pharmacy Consult for Heparin Indication: chest pain/ACS  No Known Allergies  Patient Measurements: Height: 6' (182.9 cm) Weight: 157 lb 13.6 oz (71.6 kg) IBW/kg (Calculated) : 77.6 Heparin Dosing Weight: 72 kg  Vital Signs: Temp: 98.1 F (36.7 C) (10/18 0800) Temp Source: Oral (10/18 0800) BP: 180/97 (10/18 1200) Pulse Rate: 99 (10/18 1200)  Labs: Recent Labs    03/13/18 1317  03/13/18 2128 03/14/18 0334 03/14/18 0928 03/14/18 1049  HGB 10.5*  --   --  9.4*  --   --   HCT 32.6*  --   --  28.4*  --   --   PLT 263  --   --  255  --   --   APTT  --   --   --  92*  --   --   LABPROT 14.8  --   --   --   --   --   INR 1.17  --   --   --   --   --   HEPARINUNFRC  --   --   --  0.31  --  <0.10*  CREATININE 1.77*  --   --  1.27*  --   --   CKTOTAL 94  --   --   --   --   --   TROPONINI  --    < > 1.64* 1.63* 1.23*  --    < > = values in this interval not displayed.    Estimated Creatinine Clearance: 53.2 mL/min (A) (by C-G formula based on SCr of 1.27 mg/dL (H)).  Assessment: 72 y/o M with a h/o HTN, HLD, DM, and tobacco use admitted for confusion and found to have elevated troponin and EKG changes.   03/14/18  Heparin level below goal   Drip not paused pr RN  No signs of bleeding   Goal of Therapy:  Heparin level 0.3-0.7 units/ml Monitor platelets by anticoagulation protocol: Yes   Plan:  Will bolus heparin 2100 units and increase infusion to 1150 units/hr. Will recheck a HL in 8 hours.   Daily HL and CBC  Monitor for signs/symptoms of bleeding  Luisa Hart D 03/14/2018,1:03 PM

## 2018-03-14 NOTE — Progress Notes (Signed)
ANTICOAGULATION CONSULT NOTE - Follow Up Consult  Pharmacy Consult for Heparin Indication: chest pain/ACS  No Known Allergies  Patient Measurements: Height: 6' (182.9 cm) Weight: 157 lb 13.6 oz (71.6 kg) IBW/kg (Calculated) : 77.6 Heparin Dosing Weight: 72 kg  Vital Signs: Temp: 99.1 F (37.3 C) (10/18 2000) Temp Source: Oral (10/18 2000) BP: 155/83 (10/18 2100) Pulse Rate: 111 (10/18 2100)  Labs: Recent Labs    03/13/18 1317  03/14/18 0334 03/14/18 0928 03/14/18 1049 03/14/18 1552 03/14/18 2126  HGB 10.5*  --  9.4*  --   --   --   --   HCT 32.6*  --  28.4*  --   --   --   --   PLT 263  --  255  --   --   --   --   APTT  --   --  92*  --   --   --   --   LABPROT 14.8  --   --   --   --   --   --   INR 1.17  --   --   --   --   --   --   HEPARINUNFRC  --   --  0.31  --  <0.10*  --  0.27*  CREATININE 1.77*  --  1.27*  --   --   --   --   CKTOTAL 94  --   --   --   --   --   --   TROPONINI  --    < > 1.63* 1.23*  --  1.11*  --    < > = values in this interval not displayed.    Estimated Creatinine Clearance: 53.2 mL/min (A) (by C-G formula based on SCr of 1.27 mg/dL (H)).  Assessment: 72 y/o M with a h/o HTN, HLD, DM, and tobacco use admitted for confusion and found to have elevated troponin and EKG changes.   03/14/18  PM heparin level 0.27 after heparin bolus 2100 unit bolus and drip increased to 1150 units/hr.   HL slightly below goal. No bleeding reported. Heparin infusing with no problems.  Goal of Therapy:  Heparin level 0.3-0.7 units/ml Monitor platelets by anticoagulation protocol: Yes   Plan:  Increase heparin drip to 1300 units/hr and check heparin level in 8 hours Daily CBC and heparin level while on heparin  Herby Abraham, Pharm.D [phone] 03/14/2018 10:16 PM

## 2018-03-14 NOTE — Evaluation (Signed)
Clinical/Bedside Swallow Evaluation Patient Details  Name: Jeremy Gill MRN: [id number] Date of Birth: 1945/11/26  Today's Date: 03/14/2018 Time: SLP Start Time (ACUTE ONLY): 1315 SLP Stop Time (ACUTE ONLY): 1330 SLP Time Calculation (min) (ACUTE ONLY): 15 min  Past Medical History:  Past Medical History:  Diagnosis Date  . ANKLE SPRAIN 12/16/2006   Qualifier: Diagnosis of  By: Tawanna Cooler MD, Eugenio Hoes   . ANXIETY 10/25/2006  . CELLULITIS, LEG, RIGHT 09/08/2007   Qualifier: Diagnosis of  By: Tawanna Cooler MD, Eugenio Hoes   . Chronic pain disorder 11/30/2015  . COPD 10/25/2006  . 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 DIABETES MELLITUS, TYPE I 10/25/2006  . Diabetic polyneuropathy (HCC) 04/08/2007   Qualifier: Diagnosis of  By: Tawanna Cooler MD, Eugenio Hoes   . 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 Essential hypertension 10/25/2006   Qualifier: Diagnosis of  By: Rosette Reveal RN, Jorene Minors   . Generalized osteoarthritis of multiple sites 11/30/2015  . HYPERTENSION 10/25/2006  . 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 Low back pain with radiation 01/23/2016  . Polyneuropathy in diabetes(357.2) 04/08/2007  . Psoriasis   . TOBACCO ABUSE 07/08/2007  . VENOUS INSUFFICIENCY 11/25/2007   Past Surgical History:  Past Surgical History:  Procedure Laterality Date  . TESTICLE REMOVAL     R testicle   HPI:  72 yo male adm to [facility] with HCAP.  PMH + for HTN, DM, chronic back pain - progressive encephalopathy over the last few weeks - wife ?s mild dementia per MD note.  Pt found to have increased troponins also and cardiology consult conducted.  Pt CT head negative.  CXR showed LLL posterior density = ? septic emboli vs multifocal pna.  ? oral thrush pt being treated= also as h/o hypertrophic tongue pallilae.  Swallow evaluation ordered.     Assessment / Plan / Recommendation Clinical  Impression  Patient presents with concerns for probably airway penetration/aspiraton of thin liquids.  Appearance of discoordinated swallow, multiple swallows and throat clearing postswallow noted.  Pt also admits to having problems choking with liquids prior to admit.  Due to pt's current medical status including increased troponins, likely multifocal pna - recommend modify diet to decrease aspiration risk.  MBS would be helpful during hospital course to help elucidate source of dysphagia/aspiration risk and examine swallow physiology and anatomy.  Thanks for this order.  SLP Visit Diagnosis: Dysphagia, oropharyngeal phase (R13.12)    Aspiration Risk  Mild aspiration risk    Diet Recommendation Dysphagia 3 (Mech soft);Nectar-thick liquid(ice chips ok)   Liquid Administration via: Straw;Cup Medication Administration: Whole meds with puree(follow with liquids) Supervision: Staff to assist with self feeding Compensations: Minimize environmental distractions;Slow rate;Small sips/bites Postural Changes: Seated upright at 90 degrees;Remain upright for at least 30 minutes after po intake    Other  Recommendations Oral Care Recommendations: Oral care BID   Follow up Recommendations (tbd)      Frequency and Duration min 1 x/week  1 week       Prognosis Prognosis for Safe Diet Advancement: Fair Barriers to Reach Goals: Other (Comment)(pulmonary status)      Swallow Study   General Date of Onset: 03/14/18 HPI: 72 yo male adm to [facility] with HCAP.  PMH + for HTN, DM, chronic back pain - progressive encephalopathy over the last few weeks - wife ?s mild  dementia per MD note.  Pt found to have increased troponins also and cardiology consult conducted.  Pt CT head negative.  CXR showed LLL posterior density = ? septic emboli vs multifocal pna.  ? oral thrush pt being treated= also as h/o hypertrophic tongue pallilae.  Swallow evaluation ordered.   Type of Study: Bedside Swallow Evaluation Previous  Swallow Assessment: 09/2017, poor dentition, Bell's palsy left facial asymmetry regular/thin,   12/2017 dys2/thin recommended - advanced, pt dislikes dys2 diet Diet Prior to this Study: NPO Temperature Spikes Noted: Yes(99.7) Respiratory Status: Room air History of Recent Intubation: No Behavior/Cognition: Alert;Doesn't follow directions;Requires cueing Oral Cavity Assessment: Within Functional Limits Oral Care Completed by SLP: No Oral Cavity - Dentition: Poor condition Vision: Impaired for self-feeding Self-Feeding Abilities: Needs assist Patient Positioning: Upright in bed Baseline Vocal Quality: Low vocal intensity Volitional Cough: Weak Volitional Swallow: Unable to elicit    Oral/Motor/Sensory Function Overall Oral Motor/Sensory Function: Mild impairment(subtle left facial asymmetry, h/o bell's palsy)   Ice Chips Ice chips: Within functional limits Presentation: Spoon   Thin Liquid Thin Liquid: Impaired Presentation: Straw Pharyngeal  Phase Impairments: Throat Clearing - Immediate;Multiple swallows    Nectar Thick Nectar Thick Liquid: Within functional limits Presentation: Straw;Self Fed   Honey Thick Honey Thick Liquid: Not tested   Puree Puree: Within functional limits Presentation: Spoon   Solid     Solid: Impaired Oral Phase Impairments: Impaired mastication Oral Phase Functional Implications: Prolonged oral transit(suspected)      Chales Abrahams 03/14/2018,3:07 PM   Donavan Burnet, MS [facility] SLP Acute Rehab Services Pager [phone] Office [phone]

## 2018-03-14 NOTE — Progress Notes (Signed)
Initial Nutrition Assessment  DOCUMENTATION CODES:   Non-severe (moderate) malnutrition in context of chronic illness  INTERVENTION:  - Diet advancement as medically feasible. - Continue Ensure Enlive TID, each supplement provides 350 kcal and 20 grams of protein. - Will order daily multivitamin with minerals. - Will monitor for SLP recommendations and will make adjustments to oral nutrition supplements, if needed.    NUTRITION DIAGNOSIS:   Moderate Malnutrition related to chronic illness(DM with neuropathy, dysphagia) as evidenced by mild fat depletion, mild muscle depletion, moderate muscle depletion.  GOAL:   Patient will meet greater than or equal to 90% of their needs  MONITOR:   Diet advancement, PO intake, Supplement acceptance, Weight trends, Labs, Skin  REASON FOR ASSESSMENT:   Malnutrition Screening Tool  ASSESSMENT:   72 y.o. male with medical history significant of DM, neuropathy, HTN, HLD, and BPH. He was brought to the hospital by family due to confusion.  Wife reported that over the past couple weeks, he has been slightly more confused than his baseline and she suspects he may have a degree of underlying dementia.  He was hospitalized in 12/2017 with acute encephalopathy, and improved, however, at baseline he appears to sometimes be somewhat confused.  Wife reported a reversal of sleep-wake patter over the past 2 weeks. No reported abdominal pain, chest pain, SOB, N/V/D, or fevers.  He has been complaining of increased lower back pain.  He continues to smoke a pipe and has been resistant to quit.  Wife reported that he has a chronic cough but does not feel like it has gotten worse.  BMI WDL. Patient has been NPO since admission. Flow sheet indicates patient is a/o to self and place. Talked with RN who reports that patient is confused and reported that wife and daughter, who were in waiting area, would be a better source of information. Patient able to state that he is  feeling ok and denies abdominal pain or nausea at the time of RD visit.  Patient's wife and mother-in-law were in the waiting area, but his daughter had left. Wife provided all of the following information. Patient was admitted in August at which time he was seen by SLP and plan is for SLP to see him this admission. Patient has had ongoing difficulties with eating and drinking and has refused wife's offers to grind foods or make them easier to chew. He has difficulties with all foods and liquids, does better when he uses a straw. He sometimes will hold food in his mouth. Wife reports she was informed patient may have thrush.   He mainly lays in bed all day and sometimes will eat very well but mainly refuses to eat more than one meal/day, in the evening, which consists of meal and cooked vegetables. He has had Glucerna in the past and wife recently purchased Ensure, which he drinks on an inconsistent basis.   Wife reports that patient has been very weak and that he has lost "a lot" of weight over the past 6-7 months. She is unsure how much weight he may have lost or UBW. Per chart review, current weight is 158 lb and weight on 02/06/17 was 181 lb. This indicates 23 lb weight loss (13% body weight) in the past 13 months; this is not significant for time frame.    Medications reviewed; sliding scale Novolog, 5 mg Mycostatin QID, 1 packet Miralax/day. Labs reviewed; CBGs: 60 and 89 mg/dL today, BUN: 41 mg/dL, creatinine: 1.61 mg/dL, Ca: 8.2 mg/dL, GFR: 55  mL/min. IVF; NS @ 100 mL/hr.       NUTRITION - FOCUSED PHYSICAL EXAM:    Most Recent Value  Orbital Region  Mild depletion  Upper Arm Region  Mild depletion  Thoracic and Lumbar Region  Unable to assess  Buccal Region  Mild depletion  Temple Region  Unable to assess  Clavicle Bone Region  Moderate depletion  Clavicle and Acromion Bone Region  Moderate depletion  Scapular Bone Region  Unable to assess  Dorsal Hand  Mild depletion  Patellar  Region  No depletion  Anterior Thigh Region  Unable to assess  Posterior Calf Region  Mild depletion  Edema (RD Assessment)  None  Hair  Reviewed  Eyes  Reviewed  Mouth  Reviewed  Skin  Reviewed  Nails  Reviewed       Diet Order:   Diet Order            Diet NPO time specified Except for: Sips with Meds  Diet effective now              EDUCATION NEEDS:   No education needs have been identified at this time  Skin:  Skin Assessment: Skin Integrity Issues: Skin Integrity Issues:: DTI, Other (Comment) DTI: sacrum, R elbow, R foot Other: non-pressure injury to R elbow  Last BM:  10/18  Height:   Ht Readings from Last 1 Encounters:  03/13/18 6' (1.829 m)    Weight:   Wt Readings from Last 1 Encounters:  03/13/18 71.6 kg    Ideal Body Weight:  80.91 kg  BMI:  Body mass index is 21.41 kg/m.  Estimated Nutritional Needs:   Kcal:  1790-2005  Protein:  85-95 grams  Fluid:  >/= 1.8 L/day     Trenton Gammon, MS, RD, LDN, [facility] Inpatient Clinical Dietitian Pager # [phone] After hours/weekend pager # [phone]

## 2018-03-15 ENCOUNTER — Other Ambulatory Visit: Payer: Self-pay

## 2018-03-15 DIAGNOSIS — G9341 Metabolic encephalopathy: Secondary | ICD-10-CM

## 2018-03-15 DIAGNOSIS — N183 Chronic kidney disease, stage 3 (moderate): Secondary | ICD-10-CM

## 2018-03-15 DIAGNOSIS — D649 Anemia, unspecified: Secondary | ICD-10-CM

## 2018-03-15 DIAGNOSIS — N179 Acute kidney failure, unspecified: Secondary | ICD-10-CM

## 2018-03-15 DIAGNOSIS — R079 Chest pain, unspecified: Secondary | ICD-10-CM

## 2018-03-15 LAB — URINE CULTURE: Culture: NO GROWTH

## 2018-03-15 LAB — GLUCOSE, CAPILLARY
GLUCOSE-CAPILLARY: 271 mg/dL — AB (ref 70–99)
Glucose-Capillary: 273 mg/dL — ABNORMAL HIGH (ref 70–99)
Glucose-Capillary: 256 mg/dL — ABNORMAL HIGH (ref 70–99)
Glucose-Capillary: 351 mg/dL — ABNORMAL HIGH (ref 70–99)

## 2018-03-15 LAB — CBC WITH DIFFERENTIAL/PLATELET
Abs Immature Granulocytes: 0.06 10*3/uL (ref 0.00–0.07)
Basophils Absolute: 0 10*3/uL (ref 0.0–0.1)
Basophils Relative: 0 %
Eosinophils Absolute: 0.1 10*3/uL (ref 0.0–0.5)
Eosinophils Relative: 1 %
HEMATOCRIT: 26.6 % — AB (ref 39.0–52.0)
Hemoglobin: 8.6 g/dL — ABNORMAL LOW (ref 13.0–17.0)
IMMATURE GRANULOCYTES: 1 %
LYMPHS ABS: 1.9 10*3/uL (ref 0.7–4.0)
LYMPHS PCT: 18 %
MCH: 29.9 pg (ref 26.0–34.0)
MCHC: 32.3 g/dL (ref 30.0–36.0)
MCV: 92.4 fL (ref 80.0–100.0)
MONO ABS: 0.6 10*3/uL (ref 0.1–1.0)
MONOS PCT: 6 %
NEUTROS PCT: 74 %
Neutro Abs: 8.1 10*3/uL — ABNORMAL HIGH (ref 1.7–7.7)
PLATELETS: 255 10*3/uL (ref 150–400)
RBC: 2.88 MIL/uL — ABNORMAL LOW (ref 4.22–5.81)
RDW: 13.7 % (ref 11.5–15.5)
WBC: 10.8 10*3/uL — ABNORMAL HIGH (ref 4.0–10.5)
nRBC: 0 % (ref 0.0–0.2)

## 2018-03-15 LAB — BASIC METABOLIC PANEL
ANION GAP: 10 (ref 5–15)
BUN: 28 mg/dL — ABNORMAL HIGH (ref 8–23)
CHLORIDE: 105 mmol/L (ref 98–111)
CO2: 23 mmol/L (ref 22–32)
Calcium: 7.9 mg/dL — ABNORMAL LOW (ref 8.9–10.3)
Creatinine, Ser: 1.03 mg/dL (ref 0.61–1.24)
GFR calc Af Amer: >60 mL/min (ref >60)
GLUCOSE: 278 mg/dL — AB (ref 70–99)
POTASSIUM: 3.2 mmol/L — AB (ref 3.5–5.1)
Sodium: 138 mmol/L (ref 135–145)

## 2018-03-15 LAB — TROPONIN I: Troponin I: 0.81 ng/mL (ref <0.03)

## 2018-03-15 LAB — HEPARIN LEVEL (UNFRACTIONATED)
HEPARIN UNFRACTIONATED: 0.46 [IU]/mL (ref 0.30–0.70)
Heparin Unfractionated: 0.22 IU/mL — ABNORMAL LOW (ref 0.30–0.70)

## 2018-03-15 LAB — MAGNESIUM: MAGNESIUM: 1.3 mg/dL — AB (ref 1.7–2.4)

## 2018-03-15 MED ORDER — MAGNESIUM SULFATE 4 GM/100ML IV SOLN
4.0000 g | Freq: Once | INTRAVENOUS | Status: AC
  Administered 2018-03-15: 4 g via INTRAVENOUS
  Filled 2018-03-15: qty 100

## 2018-03-15 MED ORDER — METOPROLOL TARTRATE 25 MG PO TABS
25.0000 mg | ORAL_TABLET | Freq: Two times a day (BID) | ORAL | Status: DC
  Administered 2018-03-15 (×2): 25 mg via ORAL
  Filled 2018-03-15 (×3): qty 1

## 2018-03-15 MED ORDER — LEVALBUTEROL HCL 0.63 MG/3ML IN NEBU
0.6300 mg | INHALATION_SOLUTION | Freq: Three times a day (TID) | RESPIRATORY_TRACT | Status: DC
  Administered 2018-03-15 – 2018-03-17 (×9): 0.63 mg via RESPIRATORY_TRACT
  Filled 2018-03-15 (×9): qty 3

## 2018-03-15 MED ORDER — INSULIN GLARGINE 100 UNIT/ML ~~LOC~~ SOLN
10.0000 [IU] | Freq: Every day | SUBCUTANEOUS | Status: DC
  Administered 2018-03-15: 10 [IU] via SUBCUTANEOUS
  Filled 2018-03-15: qty 0.1

## 2018-03-15 MED ORDER — IPRATROPIUM BROMIDE 0.02 % IN SOLN
0.5000 mg | Freq: Three times a day (TID) | RESPIRATORY_TRACT | Status: DC
  Administered 2018-03-15 – 2018-03-17 (×9): 0.5 mg via RESPIRATORY_TRACT
  Filled 2018-03-15 (×9): qty 2.5

## 2018-03-15 MED ORDER — POTASSIUM CHLORIDE 20 MEQ PO PACK
40.0000 meq | PACK | Freq: Once | ORAL | Status: AC
  Administered 2018-03-15: 40 meq via ORAL
  Filled 2018-03-15: qty 2

## 2018-03-15 MED ORDER — HEPARIN BOLUS VIA INFUSION
1000.0000 [IU] | Freq: Once | INTRAVENOUS | Status: AC
  Administered 2018-03-15: 1000 [IU] via INTRAVENOUS
  Filled 2018-03-15: qty 1000

## 2018-03-15 MED ORDER — HEPARIN (PORCINE) IN NACL 100-0.45 UNIT/ML-% IJ SOLN
1450.0000 [IU]/h | INTRAMUSCULAR | Status: DC
  Administered 2018-03-15 (×2): 1450 [IU]/h via INTRAVENOUS
  Filled 2018-03-15: qty 250

## 2018-03-15 MED ORDER — ISOSORBIDE MONONITRATE ER 30 MG PO TB24
30.0000 mg | ORAL_TABLET | Freq: Every day | ORAL | Status: DC
  Administered 2018-03-15 – 2018-03-18 (×4): 30 mg via ORAL
  Filled 2018-03-15 (×6): qty 1

## 2018-03-15 MED ORDER — FLUCONAZOLE 100 MG PO TABS
100.0000 mg | ORAL_TABLET | Freq: Every day | ORAL | Status: DC
  Administered 2018-03-15 – 2018-03-18 (×4): 100 mg via ORAL
  Filled 2018-03-15 (×6): qty 1

## 2018-03-15 NOTE — Progress Notes (Signed)
Progress Note  Patient Name: Jeremy Gill Date of Encounter: 03/15/2018  Primary Cardiologist: Dr Irish Lack  Subjective   A mild chest pain last night per nurse, the patient is confused and doesn't answer questions appropriately.  Inpatient Medications    Scheduled Meds: . aspirin  81 mg Oral Daily  . atorvastatin  40 mg Oral Daily  . DULoxetine  60 mg Oral Daily  . feeding supplement (ENSURE ENLIVE)  1 Bottle Oral TID BM  . fluconazole  100 mg Oral Daily  . gabapentin  300 mg Oral TID  . guaiFENesin  1,200 mg Oral BID  . heparin  1,000 Units Intravenous Once  . insulin aspart  0-9 Units Subcutaneous TID WC  . insulin glargine  10 Units Subcutaneous QHS  . ipratropium  0.5 mg Nebulization TID  . levalbuterol  0.63 mg Nebulization TID  . loratadine  10 mg Oral Daily  . metoprolol tartrate  5 mg Intravenous Q6H  . mometasone-formoterol  2 puff Inhalation BID  . multivitamin with minerals  1 tablet Oral Daily  . nystatin  5 mL Oral QID  . polyethylene glycol  17 g Oral Daily  . tamsulosin  0.4 mg Oral QPC breakfast   Continuous Infusions: . sodium chloride 100 mL/hr at 03/14/18 2252  . ceFEPime (MAXIPIME) IV Stopped (03/15/18 0517)  . heparin     PRN Meds: hydrALAZINE, ipratropium, levalbuterol, RESOURCE THICKENUP CLEAR, traMADol   Vital Signs    Vitals:   03/15/18 0600 03/15/18 0750 03/15/18 0800 03/15/18 0809  BP: (!) 164/87  (!) 164/92   Pulse: 86  97   Resp: 14  18   Temp:    97.7 F (36.5 C)  TempSrc:    Oral  SpO2: 100% 100% 100%   Weight:      Height:        Intake/Output Summary (Last 24 hours) at 03/15/2018 0916 Last data filed at 03/15/2018 0800 Gross per 24 hour  Intake 2193 ml  Output 2220 ml  Net -27 ml   Filed Weights   03/13/18 1247 03/13/18 1600  Weight: 68 kg 71.6 kg    Telemetry    ST - Personally Reviewed  Physical Exam  Confused, disheveled GEN: No acute distress.   Neck: No JVD Cardiac: RRR, no murmurs, rubs, or  gallops.  Respiratory: Clear to auscultation bilaterally. GI: Soft, nontender, non-distended  MS: No edema; B/L heel pressure ulcers Neuro:  Nonfocal  Psych: Normal affect   Labs    Chemistry Recent Labs  Lab 03/13/18 1317 03/14/18 0334  NA 142 142  K 5.0 3.9  CL 104 109  CO2 25 25  GLUCOSE 218* 87  BUN 52* 41*  CREATININE 1.77* 1.27*  CALCIUM 8.7* 8.2*  PROT 6.7 5.9*  ALBUMIN 2.8* 2.3*  AST 25 19  ALT 16 14  ALKPHOS 43 36*  BILITOT 1.2 0.9  GFRNONAA 37* 55*  GFRAA 42* >60  ANIONGAP 13 8     Hematology Recent Labs  Lab 03/13/18 1317 03/14/18 0334 03/15/18 0734  WBC 12.8* 11.7* 10.8*  RBC 3.44* 3.05* 2.88*  HGB 10.5* 9.4* 8.6*  HCT 32.6* 28.4* 26.6*  MCV 94.8 93.1 92.4  MCH 30.5 30.8 29.9  MCHC 32.2 33.1 32.3  RDW 13.8 13.7 13.7  PLT 263 255 255    Cardiac Enzymes Recent Labs  Lab 03/13/18 2128 03/14/18 0334 03/14/18 0928 03/14/18 1552  TROPONINI 1.64* 1.63* 1.23* 1.11*    Recent Labs  Lab 03/13/18 1312  TROPIPOC 0.83*     BNPNo results for input(s): BNP, PROBNP in the last 168 hours.   DDimer No results for input(s): DDIMER in the last 168 hours.   Radiology    Dg Chest 2 View  Result Date: 03/13/2018 CLINICAL DATA:  Possible sepsis EXAM: CHEST - 2 VIEW COMPARISON:  Chest x-ray of August 1929 FINDINGS: The lungs are mildly hyperinflated with hemidiaphragm flattening. There is a new dense infiltrate in the left lower lobe posteriorly. There are patchy airspace opacities elsewhere in the left lung and to a lesser extent in the right lung worrisome for septic emboli. The heart and pulmonary vascularity are normal. The mediastinum is normal in width. There is calcification in the wall of the aortic arch. IMPRESSION: COPD. Left lower lobe pneumonia. Patchy airspace opacities elsewhere in the left lung and to a lesser extent on the right worrisome for multifocal pneumonia or septic emboli. Electronically Signed   By: David  Martinique M.D.   On:  03/13/2018 13:53   Ct Head Wo Contrast  Result Date: 03/13/2018 CLINICAL DATA:  72 year old male with unexplained altered mental status. EXAM: CT HEAD WITHOUT CONTRAST TECHNIQUE: Contiguous axial images were obtained from the base of the skull through the vertex without intravenous contrast. COMPARISON:  Brain MRI 01/14/2018, head CT 01/13/2018 and earlier. FINDINGS: Brain: Chronic confluent cerebral white matter hypodensity, moderate to severe heterogeneity in the left deep gray matter nuclei, and pons. Stable gray-white matter differentiation throughout the brain. No cortical encephalomalacia identified. No midline shift, ventriculomegaly, mass effect, evidence of mass lesion, intracranial hemorrhage or evidence of cortically based acute infarction. Vascular: Calcified atherosclerosis at the skull base. No suspicious intracranial vascular hyperdensity. Skull: Negative. Sinuses/Orbits: Visualized paranasal sinuses and mastoids are stable and well pneumatized. Other: Stable orbit and scalp soft tissues. IMPRESSION: No acute intracranial abnormality. Stable non contrast CT appearance of the brain with advanced chronic small vessel disease. Electronically Signed   By: Genevie Ann M.D.   On: 03/13/2018 14:14    Cardiac Studies   TTE:03/14/2018 - Left ventricle: The cavity size was normal. There was moderate   focal basal hypertrophy of the septum. Systolic function was   normal. The estimated ejection fraction was in the range of 60%   to 65%. Wall motion was normal; there were no regional wall   motion abnormalities. Doppler parameters are consistent with   abnormal left ventricular relaxation (grade 1 diastolic   dysfunction). The E/e&' ratio is between 8-15, suggesting   indeterminate LV filling pressure. - Aortic valve: Calcified leaflets with restricted motion. No   significant stenosis. Mean gradient (S): 10 mm Hg. Peak gradient   (S): 19 mm Hg. Valve area (VTI): 2.79 cm^2. Valve area (Vmax):    2.44 cm^2. Valve area (Vmean): 2.46 cm^2. - Mitral valve: Calcified annulus. Mildly thickened leaflets .   There was trivial regurgitation. Valve area by continuity   equation (using LVOT flow): 3.24 cm^2. - Left atrium: The atrium was normal in size. - Right atrium: Prominent eustachian valve. - Systemic veins: The IVC measures <2.1 cm, but does not collapse   >50%, suggesting an elevated RA pressure of 8 mmHg. - Pericardium, extracardiac: A trivial pericardial effusion was   identified posterior to the heart.  Impressions:  - LVEF 60-65%, moderate focal basal septal hypertrophy, noraml wall   motion, grade 1 DD< indetermiante LV filling pressure, aortic   valve calcification without signifcant stenosis, MAC with trivial   MR, normal LA Size, prominent eustachian valve, IVC  suggests   elevated RA pressure, trivial posterior pericardial effusion.   Patient Profile     72 y.o. male with pneumonia, AKI and NSTEMI  Assessment & Plan    1. Chest pain with elevated troponin: -Pt presented to [facility] on 03/13/2018 with altered mental status. History obtained from family and chart review as patient continues to have issues with ongoing confusion. According to family, over the past several weeks patient has suffered from increasing confusion with suspicion of progression of underlying dementia>>on arrival pain had no complaints of CP however on 03/14/18 he had an episode of chest/epigastirc pain with EKG changes.  -EKG with ST and ST depression in V leads  -Troponin, elevated at 1.37>1.64>1.63>1.23->1.1 -Echocardiogram shows normal LVEF 60-65% and no regional wall motion abnormalities -Recent stress test 11/2016 considered low risk with no ischemia noted  -Given acute illness, anemia, acute kidney injury, confusion, underlying dementia, the patient being bedridden and overall in poor shape would treat medically at this time. After stabilization, would consider cardiac catheterization at a later  date. Not currently a cath candidate given the above -Continue ASA, statin, BB, add imdur  2. Hypertension: -Stable, 166/94>164/89>146/76 -start metoprolol 25 mg po BID - start imdur 30 mg po daily  3. Acute metabolic encephalopathy in the setting of sepsis and pneumonia: -Continue vancomycin and cefepime per primary team -Awaiting cultures>>> strep pneumo and Legionella antigens added on for assessment -Albuterol as needed, per primary team  4. Acute kidney injury: -Creatinine, 1.27>>0.8-1.0 at baseline  -Likely in the setting of acute illness and poor oral intake -IV fluid hydration -Follow renal function closely with daily BMET  5. DM2: -SSI for glucose control while inpatient status -Per primary team  6. Dementia: -Baseline confusion with worsening symptoms over the last 2 to 3 weeks -Questionable formal evaluation as outpatient per chart review  For questions or updates, please contact [place] Please consult [email or web address] for contact info under        Signed, Ena Dawley, MD  03/15/2018, 9:16 AM

## 2018-03-15 NOTE — Progress Notes (Signed)
PHARMACIST - PHYSICIAN COMMUNICATION DR:   Jeremy Gill CONCERNING: Antibiotic IV to Oral Route Change Policy  RECOMMENDATION: This patient is receiving fluconazole by the intravenous route.  Based on criteria approved by the Pharmacy and Therapeutics Committee, the antibiotic(s) is/are being converted to the equivalent oral dose form(s).   DESCRIPTION: These criteria include:  Patient being treated for a respiratory tract infection, urinary tract infection, cellulitis or clostridium difficile associated diarrhea if on metronidazole  The patient is not neutropenic and does not exhibit a GI malabsorption state  The patient is eating (either orally or via tube) and/or has been taking other orally administered medications for a least 24 hours  The patient is improving clinically and has a Tmax < 100.5  If you have questions about this conversion, please contact the Pharmacy Department  []   [phone] )  Jeremy Gill []   [phone] )  [facility] []   [phone] )  Jeremy Gill []   [phone] )  [facility] [x]   [phone] )  Jeremy Gill   Jeremy Gill, PharmD Clinical Pharmacist Pager # [phone]

## 2018-03-15 NOTE — Progress Notes (Signed)
Pharmacy: Re- heparin   Patient's a 72 y.o M currently on heparin for ACS.  Heparin level now back therapeutic at 0.46 (goal 0.3-0.7) after rate increased to 1450 units/hr earlier today.  No bleeding documented.   Plan: - continue heparin drip at 1450 units/hr  - will recheck another level at 0100 on 10/20 to confirm before changing to daily heparin level monitoring - monitor for s/s bleeding  Dorna Leitz, PharmD, BCPS 03/15/2018 5:49 PM

## 2018-03-15 NOTE — Progress Notes (Addendum)
PROGRESS NOTE    Jeremy Gill  WJX:[id number] DOB: [birth year] DOA: 03/13/2018 PCP: [place], [name], MD    Brief Narrative:   Jeremy Gill is a 72 y.o. male with medical history significant of DM with neuropathy, HTN, HLD, BPH who is being brought to the hospital by family due to confusion.  Patient is alert however quite confused, fidgety, and cannot contribute much to the story.  I have discussed with the patient's wife who is at bedside.  She tells me that over the past couple weeks, he has been slightly more confused than his baseline.  She suspects he may have a degree of underlying dementia.  He was hospitalized in August of this year with acute encephalopathy, and improved however still at times even at home when he is at baseline he appears somewhat confused.  She also reports that over the last 2 weeks he has been having a reversal of sleep wake pattern, staying up all night and sleeping during the day.  She denies patient having a fever/chills/feeling hot.  There are no reported abdominal pain chest pain, shortness of breath, nausea vomiting or diarrhea.  He eats "fair" per wife.  He has been complaining of increased lower back pain, he has had this pain for years however over the last month he states that his home tramadol is no longer working.  He continues to smoke a pipe and has been resistant to quitting.  Wife reported that he has a chronic cough but does not feel like it has gotten worse.  ED Course: in the ED patient is found to be septic, tachycardic, tachypneic, with increased WBC. He is confused.Labs show renal failure with Cr 1.77 from prior normal values.Troponin is 0.8, LA 3.9. CXR with concern with multifocal pneumonia. He was given Vanc/Cefepime and we were asked to admit   Assessment & Plan:   Principal Problem:   Sepsis (HCC) Active Problems:   HCAP (healthcare-associated pneumonia)   Diabetes mellitus type 2 with neurological manifestations (HCC)   Diabetic  polyneuropathy (HCC)   Essential hypertension   COPD (chronic obstructive pulmonary disease) (HCC)   Anxiety disorder, unspecified   Acute metabolic encephalopathy   Hyperlipidemia associated with type 2 diabetes mellitus (HCC)   Acute renal failure superimposed on stage 3 chronic kidney disease (HCC)   Anemia   Pressure injury of skin   Protein-calorie malnutrition, severe   Elevated troponin   Abnormal EKG   Chest pain   Oral candidiasis   Dysphagia   Malnutrition of moderate degree  1 sepsis secondary to multifocal pneumonia/healthcare associated pneumonia Patient was admitted with sepsis Patient admitted with sepsis with altered mental status, chest x-ray consistent with multifocal pneumonia, patient noted to be tachycardic on admission, with a leukocytosis, elevated lactic acid level.  Urinalysis nitrite negative, leukocytes negative.  Blood cultures obtained and are pending.  MRSA PCR negative.  Urine Legionella antigen pending.  Urine pneumococcus antigen pending.  Continue empiric IV cefepime.  Discontinue IV vancomycin.  Continue Mucinex, scheduled nebs, Claritin.  Supportive care.    2.  Elevated troponin/chest pain/EKG changes/NSTEMI On admission patient was noted to have a point-of-care troponin at 0.83.  Cardiac enzymes were cycled with troponins rising to as high as 1.64.  Patient did have some chest pain early on this morning which has since resolved.  EKG done showed some ST depressions in leads V3 through V6.  Elevated troponin EKG changes may be secondary to demand ischemia however patient with history of  COPD, diabetes, hyperlipidemia are concerning for possible acute coronary syndrome.  2D echo with a EF of 60 to 65%, grade 1 diastolic dysfunction.  Serial cardiac enzymes slowly trending down.  Fasting lipid panel with a LDL of 28.  Patient currently on a heparin drip.  Patient has been seen in consultation by cardiology who feel that given patient's acute illness, anemia,  acute kidney injury, confusion, probable underlying dementia and overall poor shape we will treat medically at this time.  Per cardiology after stabilization would consider cardiac catheterization at a later date.  Patient started on aspirin and IV Lopressor changed to oral beta-blocker.  M. Doerr added to patient's regimen.  Continue statin.  Cardiology following and I appreciate the input and recommendations.   3.  Acute metabolic encephalopathy Likely secondary to problem #1.  Improving slowly.  Patient alert to self, place, knows it is 2019.  Thinks it is August.  Does not know who the president is.  Knows he is at [facility].  Patient has been pancultured.  Continue empiric IV antibiotics.  4.  Oral candidiasis Patient has been started on IV fluconazole.  Patient now on oral Diflucan as well as nystatin swish and swallow.   5.  Dysphagia Patient with some complaints of dysphagia.  Per wife patient wincing when he tries to swallow.  Patient however denies any food getting stuck.  Patient denies any significant odynophagia.  Patient however is noted to have oral thrush.  Continue fluconazole.  Patient has been seen by speech therapist and patient started on a dysphagia 3 diet.  Follow.    6.  COPD Stable.  Continue place on Claritin, Dulera and scheduled nebulizer treatments.  7.  Acute kidney injury In the setting of sepsis and poor oral intake.  Improving with hydration.  Continue IV fluids and follow.   8.  Type 2 diabetes mellitus with diabetic neuropathy Hemoglobin A1c was 9.1 on January 14, 2018.  CBG this morning was 271.  Continue to hold oral hypoglycemic agents.  Resume home dose Lantus as patient now on a dysphagia 3 diet with improved oral intake.  Continue sliding scale insulin.   9.  Hypertension Due to swallowing issues patient was placed on IV Lopressor on 03/14/2018.  Patient now on a dysphagia 3 diet and IV Lopressor has been changed to oral beta-blocker.  Imdur has  been added to patient's regimen per cardiology.  Follow.   10.  Presumed dementia Per admitting physician per wife's report with unclear baseline.  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  Will likely need outpatient follow-up.  11.  Protein calorie malnutrition Patient with poor oral input.  Patient with complaints of some dysphagia with noted oral candidiasis.  Patient also cachectic on admission.  Albumin level of 2.3.  Patient seen by speech therapy and patient started on a dysphagia 3 diet.  Continue Ensure.   12.  Multiple pressure injuries to right elbow, right medial heel, sacrum with deep tissue pressure injury Patient seen by wound care nurse.  Wound care recommendations made which we will continue.  13.  Anemia Patient with no overt bleeding.  Likely dilutional component as well.  Check an anemia panel.  Follow H&H.  Transfusion threshold hemoglobin less than 8.  14.  Hypokalemia/hypomagnesemia Replete.  Keep magnesium greater than 2.  Patient requires ongoing inpatient admission due to presentation with sepsis secondary to multifocal healthcare associated pneumonia requiring empiric IV antibiotics.  Patient also noted to have elevated troponins with EKG changes and chest pain concerning for possible acute coronary syndrome versus demand ischemia.  Patient was started on IV heparin drip.  Cardiology has assessed patient and is following.  Patient is going to be require more than 2 inpatient days and currently on empiric IV antibiotics and empiric treatment.   DVT prophylaxis: Heparin drip Code Status: Full Family Communication: Updated patient, wife, mother-in-law at bedside. Disposition Plan: Remain in the stepdown unit.   Consultants:   Cardiology: Dr. Jens Som 03/14/2018  Procedures:    CT head 03/13/2018  Chest x-ray 03/13/2018  2D echo 03/14/2018    Antimicrobials:   IV cefepime 03/13/2018  IV vancomycin 03/13/2018>>>>> 03/15/2018   Subjective: Patient laying in bed.  Oriented to self place and year.  Thinks it is August.  Not sure who the president is.  Knows it is 2019.  More alert today and following commands.  Patient noted to have some chest pain overnight which has since resolved.  Patient still with some shortness of breath.  Patient states feeling better than on admission however not at baseline.  Patient with some complaints of lower abdominal pain.   Objective: Vitals:   03/15/18 0750 03/15/18 0800 03/15/18 0809 03/15/18 0900  BP:  (!) 164/92  (!) 160/79  Pulse:  97  (!) 101  Resp:  18  18  Temp:   97.7 F (36.5 C)   TempSrc:   Oral   SpO2: 100% 100%  100%  Weight:      Height:        Intake/Output Summary (Last 24 hours) at 03/15/2018 1025 Last data filed at 03/15/2018 0800 Gross per 24 hour  Intake 2193 ml  Output 2220 ml  Net -27 ml   Filed Weights   03/13/18 1247 03/13/18 1600  Weight: 68 kg 71.6 kg    Examination:  General exam: Appears calm and comfortable.  Dry mucous membranes.  Cachectic. Oropharynx: Oral thrush decreasing.  Poor dentition.  Respiratory system: Some diffuse coarse/rhonchorous breath sounds.  No wheezing.  No crackles.  Normal respiratory effort.  Cardiovascular system: Tachycardia.  No JVD, murmurs, rubs, gallops or clicks.  Trace bilateral lower extremity edema. Gastrointestinal system: Abdomen is nondistended, soft and nontender. No organomegaly or masses felt. Normal bowel sounds heard. Central nervous system: Alert and oriented to self, place, year. No focal neurological deficits. Extremities: Symmetric 5 x 5 power. Skin: No rashes, lesions or ulcers Psychiatry: Judgement and insight appear poor to fair. Mood & affect appropriate.     Data Reviewed: I have personally reviewed following labs and  imaging studies  CBC: Recent Labs  Lab 03/13/18 1317 03/14/18 0334 03/15/18 0734  WBC 12.8* 11.7* 10.8*  NEUTROABS 10.5*  --  8.1*  HGB 10.5* 9.4* 8.6*  HCT 32.6* 28.4* 26.6*  MCV 94.8 93.1 92.4  PLT 263 255 255   Basic Metabolic Panel: Recent Labs  Lab 03/13/18 1317 03/14/18 0334 03/15/18 0734  NA 142 142 138  K 5.0 3.9 3.2*  CL 104 109 105  CO2 25 25 23   GLUCOSE 218* 87 278*  BUN 52* 41* 28*  CREATININE 1.77* 1.27* 1.03  CALCIUM 8.7* 8.2* 7.9*  MG  --   --  1.3*   GFR: Estimated Creatinine Clearance: 65.7 mL/min (by C-G formula based on SCr of 1.03 mg/dL). Liver Function Tests: Recent  Labs  Lab 03/13/18 1317 03/14/18 0334  AST 25 19  ALT 16 14  ALKPHOS 43 36*  BILITOT 1.2 0.9  PROT 6.7 5.9*  ALBUMIN 2.8* 2.3*   No results for input(s): LIPASE, AMYLASE in the last 168 hours. No results for input(s): AMMONIA in the last 168 hours. Coagulation Profile: Recent Labs  Lab 03/13/18 1317  INR 1.17   Cardiac Enzymes: Recent Labs  Lab 03/13/18 1317  03/13/18 2128 03/14/18 0334 03/14/18 0928 03/14/18 1552 03/15/18 0827  CKTOTAL 94  --   --   --   --   --   --   TROPONINI  --    < > 1.64* 1.63* 1.23* 1.11* 0.81*   < > = values in this interval not displayed.   BNP (last 3 results) No results for input(s): PROBNP in the last 8760 hours. HbA1C: No results for input(s): HGBA1C in the last 72 hours. CBG: Recent Labs  Lab 03/14/18 1223 03/14/18 1306 03/14/18 1650 03/14/18 2204 03/15/18 0726  GLUCAP 66* 91 213* 311* 271*   Lipid Profile: Recent Labs    03/14/18 0928  CHOL 67  HDL 29*  LDLCALC 28  TRIG 49  CHOLHDL 2.3   Thyroid Function Tests: No results for input(s): TSH, T4TOTAL, FREET4, T3FREE, THYROIDAB in the last 72 hours. Anemia Panel: No results for input(s): VITAMINB12, FOLATE, FERRITIN, TIBC, IRON, RETICCTPCT in the last 72 hours. Sepsis Labs: Recent Labs  Lab 03/13/18 1314 03/13/18 1715 03/14/18 0928  LATICACIDVEN 3.92*  2.3* 0.8    Recent Results (from the past 240 hour(s))  Culture, blood (Routine x 2)     Status: None (Preliminary result)   Collection Time: 03/13/18  1:14 PM  Result Value Ref Range Status   Specimen Description   Final    BLOOD RIGHT FOREARM Performed at [facility], [address] [place], [place] [address]    Special Requests   Final    BOTTLES DRAWN AEROBIC AND ANAEROBIC Blood Culture adequate volume Performed at [facility], [address] [place], [place] [address]    Culture   Final    NO GROWTH 2 DAYS Performed at [facility], [address], [place], [place] [address]    Report Status PENDING  Incomplete  Culture, blood (Routine x 2)     Status: None (Preliminary result)   Collection Time: 03/13/18  1:17 PM  Result Value Ref Range Status   Specimen Description   Final    BLOOD LEFT ANTECUBITAL Performed at [facility], [address], [place], [place] [address]    Special Requests   Final    BOTTLES DRAWN AEROBIC AND ANAEROBIC Blood Culture adequate volume Performed at [facility], [address], [place], [place] [address]    Culture   Final    NO GROWTH 2 DAYS Performed at [facility], [address] [place], [place] [address]    Report Status PENDING  Incomplete  Urine culture     Status: None   Collection Time: 03/13/18  1:17 PM  Result Value Ref Range Status   Specimen Description   Final    URINE, CLEAN CATCH Performed at [facility], [address] [place], [place] [address]    Special Requests   Final    NONE Performed at [facility], [address], [place], [place] [address]    Culture   Final    NO  GROWTH Performed at [facility], [address], [place], [place] [address]    Report Status 03/15/2018 FINAL  Final  MRSA PCR Screening     Status: None   Collection Time: 03/13/18  6:41 PM  Result Value  Ref Range Status   MRSA by PCR NEGATIVE NEGATIVE Final    Comment:        The GeneXpert MRSA Assay (FDA approved for NASAL specimens only), is one component of a comprehensive MRSA colonization surveillance program. It is not intended to diagnose MRSA infection nor to guide or monitor treatment for MRSA infections. Performed at [facility], [address], [place], [place] [address]          Radiology Studies: Dg Chest 2 View  Result Date: 03/13/2018 CLINICAL DATA:  Possible sepsis EXAM: CHEST - 2 VIEW COMPARISON:  Chest x-ray of August 1929 FINDINGS: The lungs are mildly hyperinflated with hemidiaphragm flattening. There is a new dense infiltrate in the left lower lobe posteriorly. There are patchy airspace opacities elsewhere in the left lung and to a lesser extent in the right lung worrisome for septic emboli. The heart and pulmonary vascularity are normal. The mediastinum is normal in width. There is calcification in the wall of the aortic arch. IMPRESSION: COPD. Left lower lobe pneumonia. Patchy airspace opacities elsewhere in the left lung and to a lesser extent on the right worrisome for multifocal pneumonia or septic emboli. Electronically Signed   By: David  [place] M.D.   On: 03/13/2018 13:53   Ct Head Wo Contrast  Result Date: 03/13/2018 CLINICAL DATA:  72 year old male with unexplained altered mental status. EXAM: CT HEAD WITHOUT CONTRAST TECHNIQUE: Contiguous axial images were obtained from the base of the skull through the vertex without intravenous contrast. COMPARISON:  Brain MRI 01/14/2018, head CT 01/13/2018 and earlier. FINDINGS: Brain: Chronic confluent cerebral white matter hypodensity, moderate to severe heterogeneity in the left deep gray matter nuclei, and pons. Stable gray-white matter differentiation throughout the brain. No cortical encephalomalacia identified. No midline shift, ventriculomegaly, mass effect, evidence of mass lesion,  intracranial hemorrhage or evidence of cortically based acute infarction. Vascular: Calcified atherosclerosis at the skull base. No suspicious intracranial vascular hyperdensity. Skull: Negative. Sinuses/Orbits: Visualized paranasal sinuses and mastoids are stable and well pneumatized. Other: Stable orbit and scalp soft tissues. IMPRESSION: No acute intracranial abnormality. Stable non contrast CT appearance of the brain with advanced chronic small vessel disease. Electronically Signed   By: Odessa Fleming M.D.   On: 03/13/2018 14:14        Scheduled Meds: . aspirin  81 mg Oral Daily  . atorvastatin  40 mg Oral Daily  . DULoxetine  60 mg Oral Daily  . feeding supplement (ENSURE ENLIVE)  1 Bottle Oral TID BM  . fluconazole  100 mg Oral Daily  . gabapentin  300 mg Oral TID  . guaiFENesin  1,200 mg Oral BID  . insulin aspart  0-9 Units Subcutaneous TID WC  . insulin glargine  10 Units Subcutaneous QHS  . ipratropium  0.5 mg Nebulization TID  . isosorbide mononitrate  30 mg Oral Daily  . levalbuterol  0.63 mg Nebulization TID  . loratadine  10 mg Oral Daily  . metoprolol tartrate  25 mg Oral BID  . mometasone-formoterol  2 puff Inhalation BID  . multivitamin with minerals  1 tablet Oral Daily  . nystatin  5 mL Oral QID  . polyethylene glycol  17 g Oral Daily  . potassium  chloride  40 mEq Oral Once  . tamsulosin  0.4 mg Oral QPC breakfast   Continuous Infusions: . sodium chloride 100 mL/hr at 03/14/18 2252  . ceFEPime (MAXIPIME) IV Stopped (03/15/18 0517)  . heparin 1,450 Units/hr (03/15/18 0921)  . magnesium sulfate 1 - 4 g bolus IVPB       LOS: 1 day    Time spent: 40 minutes    Ramiro Harvest, MD [facility] Pager [phone]  If 7PM-7AM, please contact night-coverage [email or web address] Password [email or web address] 03/15/2018, 10:25 AM

## 2018-03-15 NOTE — Progress Notes (Signed)
ANTICOAGULATION CONSULT NOTE - Follow Up Consult  Pharmacy Consult for Heparin Indication: chest pain/ACS  No Known Allergies  Patient Measurements: Height: 6' (182.9 cm) Weight: 157 lb 13.6 oz (71.6 kg) IBW/kg (Calculated) : 77.6 Heparin Dosing Weight: 72 kg  Vital Signs: Temp: 97.7 F (36.5 C) (10/19 0809) Temp Source: Oral (10/19 0809) BP: 164/92 (10/19 0800) Pulse Rate: 97 (10/19 0800)  Labs: Recent Labs    03/13/18 1317  03/14/18 0334 03/14/18 0928 03/14/18 1049 03/14/18 1552 03/14/18 2126 03/15/18 0734  HGB 10.5*  --  9.4*  --   --   --   --  8.6*  HCT 32.6*  --  28.4*  --   --   --   --  26.6*  PLT 263  --  255  --   --   --   --  255  APTT  --   --  92*  --   --   --   --   --   LABPROT 14.8  --   --   --   --   --   --   --   INR 1.17  --   --   --   --   --   --   --   HEPARINUNFRC  --    < > 0.31  --  <0.10*  --  0.27* 0.22*  CREATININE 1.77*  --  1.27*  --   --   --   --   --   CKTOTAL 94  --   --   --   --   --   --   --   TROPONINI  --    < > 1.63* 1.23*  --  1.11*  --   --    < > = values in this interval not displayed.    Estimated Creatinine Clearance: 53.2 mL/min (A) (by C-G formula based on SCr of 1.27 mg/dL (H)).  Assessment: 72 y/o M with a h/o HTN, HLD, DM, and tobacco use admitted for confusion and found to have elevated troponin and EKG changes.   03/15/18  - HL below goal. Per RN, infusion not paused and no bleeding noted.  - Hgb slightly decreased  Goal of Therapy:  Heparin level 0.3-0.7 units/ml Monitor platelets by anticoagulation protocol: Yes   Plan:  - Bolus heparin 1000 units and increase infusion to 1450 units/hr.  - Recheck HL in 8 hours.  - Daily CBC/HL  Luisa Hart, PharmD Clinical Pharmacist Pager # [phone]  03/15/2018 8:56 AM

## 2018-03-16 DIAGNOSIS — R7989 Other specified abnormal findings of blood chemistry: Secondary | ICD-10-CM

## 2018-03-16 DIAGNOSIS — I10 Essential (primary) hypertension: Secondary | ICD-10-CM

## 2018-03-16 DIAGNOSIS — E538 Deficiency of other specified B group vitamins: Secondary | ICD-10-CM | POA: Diagnosis present

## 2018-03-16 DIAGNOSIS — R9431 Abnormal electrocardiogram [ECG] [EKG]: Secondary | ICD-10-CM

## 2018-03-16 LAB — BASIC METABOLIC PANEL
Anion gap: 8 (ref 5–15)
BUN: 26 mg/dL — AB (ref 8–23)
CALCIUM: 7.9 mg/dL — AB (ref 8.9–10.3)
CO2: 22 mmol/L (ref 22–32)
CREATININE: 1.18 mg/dL (ref 0.61–1.24)
Chloride: 109 mmol/L (ref 98–111)
GFR calc Af Amer: >60 mL/min (ref >60)
GFR calc non Af Amer: 60 mL/min — ABNORMAL LOW (ref >60)
GLUCOSE: 443 mg/dL — AB (ref 70–99)
POTASSIUM: 3.4 mmol/L — AB (ref 3.5–5.1)
SODIUM: 139 mmol/L (ref 135–145)

## 2018-03-16 LAB — CBC
HCT: 23.1 % — ABNORMAL LOW (ref 39.0–52.0)
HEMOGLOBIN: 7.4 g/dL — AB (ref 13.0–17.0)
MCH: 30 pg (ref 26.0–34.0)
MCHC: 32 g/dL (ref 30.0–36.0)
MCV: 93.5 fL (ref 80.0–100.0)
NRBC: 0 % (ref 0.0–0.2)
Platelets: 236 10*3/uL (ref 150–400)
RBC: 2.47 MIL/uL — ABNORMAL LOW (ref 4.22–5.81)
RDW: 13.7 % (ref 11.5–15.5)
WBC: 10.6 10*3/uL — AB (ref 4.0–10.5)

## 2018-03-16 LAB — HEPARIN LEVEL (UNFRACTIONATED): HEPARIN UNFRACTIONATED: 0.36 [IU]/mL (ref 0.30–0.70)

## 2018-03-16 LAB — IRON AND TIBC
Iron: 21 ug/dL — ABNORMAL LOW (ref 45–182)
Saturation Ratios: 16 % — ABNORMAL LOW (ref 17.9–39.5)
TIBC: 132 ug/dL — AB (ref 250–450)
UIBC: 111 ug/dL

## 2018-03-16 LAB — RETICULOCYTES
IMMATURE RETIC FRACT: 15.6 % (ref 2.3–15.9)
RBC.: 2.47 MIL/uL — AB (ref 4.22–5.81)
Retic Count, Absolute: 27.9 10*3/uL (ref 19.0–186.0)
Retic Ct Pct: 1.1 % (ref 0.4–3.1)

## 2018-03-16 LAB — HEMOGLOBIN AND HEMATOCRIT, BLOOD
HCT: 28.7 % — ABNORMAL LOW (ref 39.0–52.0)
Hemoglobin: 9.5 g/dL — ABNORMAL LOW (ref 13.0–17.0)

## 2018-03-16 LAB — GLUCOSE, CAPILLARY
GLUCOSE-CAPILLARY: 426 mg/dL — AB (ref 70–99)
Glucose-Capillary: 264 mg/dL — ABNORMAL HIGH (ref 70–99)
Glucose-Capillary: 121 mg/dL — ABNORMAL HIGH (ref 70–99)
Glucose-Capillary: 195 mg/dL — ABNORMAL HIGH (ref 70–99)

## 2018-03-16 LAB — FERRITIN: Ferritin: 169 ng/mL (ref 24–336)

## 2018-03-16 LAB — MAGNESIUM: MAGNESIUM: 1.8 mg/dL (ref 1.7–2.4)

## 2018-03-16 LAB — PREPARE RBC (CROSSMATCH)

## 2018-03-16 LAB — FOLATE: FOLATE: 3.7 ng/mL — AB (ref >5.9)

## 2018-03-16 LAB — VITAMIN B12

## 2018-03-16 LAB — ABO/RH: ABO/RH(D): A POS

## 2018-03-16 MED ORDER — METOPROLOL TARTRATE 50 MG PO TABS
50.0000 mg | ORAL_TABLET | Freq: Two times a day (BID) | ORAL | Status: DC
  Administered 2018-03-16 – 2018-03-18 (×5): 50 mg via ORAL
  Filled 2018-03-16 (×6): qty 1

## 2018-03-16 MED ORDER — POTASSIUM CHLORIDE CRYS ER 20 MEQ PO TBCR
40.0000 meq | EXTENDED_RELEASE_TABLET | Freq: Once | ORAL | Status: AC
  Administered 2018-03-16: 40 meq via ORAL
  Filled 2018-03-16: qty 2

## 2018-03-16 MED ORDER — DIPHENHYDRAMINE HCL 25 MG PO CAPS
25.0000 mg | ORAL_CAPSULE | Freq: Once | ORAL | Status: AC
  Administered 2018-03-16: 25 mg via ORAL
  Filled 2018-03-16: qty 1

## 2018-03-16 MED ORDER — ENOXAPARIN SODIUM 30 MG/0.3ML ~~LOC~~ SOLN
30.0000 mg | SUBCUTANEOUS | Status: DC
  Administered 2018-03-16 – 2018-03-17 (×2): 30 mg via SUBCUTANEOUS
  Filled 2018-03-16 (×2): qty 0.3

## 2018-03-16 MED ORDER — INSULIN GLARGINE 100 UNIT/ML ~~LOC~~ SOLN
20.0000 [IU] | Freq: Every day | SUBCUTANEOUS | Status: DC
  Administered 2018-03-16: 20 [IU] via SUBCUTANEOUS
  Filled 2018-03-16: qty 0.2

## 2018-03-16 MED ORDER — FUROSEMIDE 10 MG/ML IJ SOLN
20.0000 mg | Freq: Once | INTRAMUSCULAR | Status: AC
  Administered 2018-03-16: 20 mg via INTRAVENOUS
  Filled 2018-03-16: qty 2

## 2018-03-16 MED ORDER — ACETAMINOPHEN 325 MG PO TABS
650.0000 mg | ORAL_TABLET | Freq: Once | ORAL | Status: AC
  Administered 2018-03-16: 650 mg via ORAL
  Filled 2018-03-16: qty 2

## 2018-03-16 MED ORDER — SODIUM CHLORIDE 0.9% IV SOLUTION
Freq: Once | INTRAVENOUS | Status: AC
  Administered 2018-03-16: 09:00:00 via INTRAVENOUS

## 2018-03-16 MED ORDER — FUROSEMIDE 10 MG/ML IJ SOLN: 20.0000 mg | Freq: Once | INTRAMUSCULAR | Status: DC

## 2018-03-16 MED ORDER — MAGNESIUM SULFATE 4 GM/100ML IV SOLN
4.0000 g | Freq: Once | INTRAVENOUS | Status: AC
  Administered 2018-03-16: 4 g via INTRAVENOUS
  Filled 2018-03-16: qty 100

## 2018-03-16 MED ORDER — INSULIN ASPART 100 UNIT/ML ~~LOC~~ SOLN
0.0000 [IU] | Freq: Three times a day (TID) | SUBCUTANEOUS | Status: DC
  Administered 2018-03-16: 20 [IU] via SUBCUTANEOUS
  Administered 2018-03-16: 11 [IU] via SUBCUTANEOUS
  Administered 2018-03-16: 3 [IU] via SUBCUTANEOUS
  Administered 2018-03-17 (×2): 7 [IU] via SUBCUTANEOUS
  Administered 2018-03-17: 15 [IU] via SUBCUTANEOUS
  Administered 2018-03-18: 11 [IU] via SUBCUTANEOUS
  Administered 2018-03-18: 15 [IU] via SUBCUTANEOUS

## 2018-03-16 MED ORDER — INSULIN ASPART 100 UNIT/ML ~~LOC~~ SOLN
3.0000 [IU] | Freq: Three times a day (TID) | SUBCUTANEOUS | Status: DC
  Administered 2018-03-16 (×2): 3 [IU] via SUBCUTANEOUS

## 2018-03-16 MED ORDER — INSULIN ASPART 100 UNIT/ML ~~LOC~~ SOLN
0.0000 [IU] | Freq: Every day | SUBCUTANEOUS | Status: DC
  Administered 2018-03-17: 4 [IU] via SUBCUTANEOUS

## 2018-03-16 MED ORDER — FOLIC ACID 1 MG PO TABS
1.0000 mg | ORAL_TABLET | Freq: Every day | ORAL | Status: DC
  Administered 2018-03-16 – 2018-03-18 (×3): 1 mg via ORAL
  Filled 2018-03-16 (×5): qty 1

## 2018-03-16 NOTE — Progress Notes (Addendum)
Progress Note  Patient Name: Jeremy Gill Date of Encounter: 03/16/2018  Primary Cardiologist: Dr Irish Lack  Subjective   No chest pain or SOB, eating breakfast comfortably but AAOx1, confused.  Inpatient Medications    Scheduled Meds: . sodium chloride   Intravenous Once  . acetaminophen  650 mg Oral Once  . aspirin  81 mg Oral Daily  . atorvastatin  40 mg Oral Daily  . diphenhydrAMINE  25 mg Oral Once  . DULoxetine  60 mg Oral Daily  . feeding supplement (ENSURE ENLIVE)  1 Bottle Oral TID BM  . fluconazole  100 mg Oral Daily  . folic acid  1 mg Oral Daily  . furosemide  20 mg Intravenous Once  . gabapentin  300 mg Oral TID  . guaiFENesin  1,200 mg Oral BID  . insulin aspart  0-20 Units Subcutaneous TID WC  . insulin aspart  0-5 Units Subcutaneous QHS  . insulin aspart  3 Units Subcutaneous TID WC  . insulin glargine  20 Units Subcutaneous QHS  . ipratropium  0.5 mg Nebulization TID  . isosorbide mononitrate  30 mg Oral Daily  . levalbuterol  0.63 mg Nebulization TID  . loratadine  10 mg Oral Daily  . metoprolol tartrate  25 mg Oral BID  . mometasone-formoterol  2 puff Inhalation BID  . multivitamin with minerals  1 tablet Oral Daily  . nystatin  5 mL Oral QID  . polyethylene glycol  17 g Oral Daily  . potassium chloride  40 mEq Oral Once  . tamsulosin  0.4 mg Oral QPC breakfast   Continuous Infusions: . sodium chloride 100 mL/hr at 03/15/18 2111  . ceFEPime (MAXIPIME) IV 2 g (03/16/18 0428)  . heparin 1,450 Units/hr (03/15/18 1636)  . magnesium sulfate 1 - 4 g bolus IVPB     PRN Meds: hydrALAZINE, ipratropium, levalbuterol, RESOURCE THICKENUP CLEAR, traMADol   Vital Signs    Vitals:   03/15/18 1932 03/15/18 2010 03/16/18 0420 03/16/18 0732  BP: 132/85  139/81   Pulse: 97  91   Resp: 20  18   Temp: 98.6 F (37 C)  98.3 F (36.8 C)   TempSrc: Oral  Oral   SpO2: 92% 93% 92% 96%  Weight:      Height:        Intake/Output Summary (Last 24 hours)  at 03/16/2018 0858 Last data filed at 03/16/2018 0847 Gross per 24 hour  Intake 2826.14 ml  Output 2100 ml  Net 726.14 ml   Filed Weights   03/13/18 1247 03/13/18 1600  Weight: 68 kg 71.6 kg    Telemetry    ST - Personally Reviewed  Physical Exam  Confused, disheveled GEN: No acute distress.   Neck: No JVD Cardiac: RRR, no murmurs, rubs, or gallops.  Respiratory: Clear to auscultation bilaterally. GI: Soft, nontender, non-distended  MS: No edema; B/L heel pressure ulcers Neuro:  Nonfocal  Psych: Normal affect   Labs    Chemistry Recent Labs  Lab 03/13/18 1317 03/14/18 0334 03/15/18 0734 03/16/18 0103  NA 142 142 138 139  K 5.0 3.9 3.2* 3.4*  CL 104 109 105 109  CO2 _0 GLUCOSE 218* 87 278* 443*  BUN 52* 41* 28* 26*  CREATININE 1.77* 1.27* 1.03 1.18  CALCIUM 8.7* 8.2* 7.9* 7.9*  PROT 6.7 5.9*  --   --   ALBUMIN 2.8* 2.3*  --   --   AST 25 19  --   --  ALT 16 14  --   --   ALKPHOS 43 36*  --   --   BILITOT 1.2 0.9  --   --   GFRNONAA 37* 55* >60 60*  GFRAA 42* >60 >60 >60  ANIONGAP _0 Hematology Recent Labs  Lab 03/14/18 0334 03/15/18 0734 03/16/18 0103  WBC 11.7* 10.8* 10.6*  RBC 3.05* 2.88* 2.47*  2.47*  HGB 9.4* 8.6* 7.4*  HCT 28.4* 26.6* 23.1*  MCV 93.1 92.4 93.5  MCH 30.8 29.9 30.0  MCHC 33.1 32.3 32.0  RDW 13.7 13.7 13.7  PLT 255 255 236    Cardiac Enzymes Recent Labs  Lab 03/14/18 0334 03/14/18 0928 03/14/18 1552 03/15/18 0827  TROPONINI 1.63* 1.23* 1.11* 0.81*    Recent Labs  Lab 03/13/18 1312  TROPIPOC 0.83*     BNPNo results for input(s): BNP, PROBNP in the last 168 hours.   DDimer No results for input(s): DDIMER in the last 168 hours.   Radiology    No results found.  Cardiac Studies   TTE:03/14/2018 - Left ventricle: The cavity size was normal. There was moderate   focal basal hypertrophy of the septum. Systolic function was   normal. The estimated ejection fraction was in the range of  60%   to 65%. Wall motion was normal; there were no regional wall   motion abnormalities. Doppler parameters are consistent with   abnormal left ventricular relaxation (grade 1 diastolic   dysfunction). The E/e&' ratio is between 8-15, suggesting   indeterminate LV filling pressure. - Aortic valve: Calcified leaflets with restricted motion. No   significant stenosis. Mean gradient (S): 10 mm Hg. Peak gradient   (S): 19 mm Hg. Valve area (VTI): 2.79 cm^2. Valve area (Vmax):   2.44 cm^2. Valve area (Vmean): 2.46 cm^2. - Mitral valve: Calcified annulus. Mildly thickened leaflets .   There was trivial regurgitation. Valve area by continuity   equation (using LVOT flow): 3.24 cm^2. - Left atrium: The atrium was normal in size. - Right atrium: Prominent eustachian valve. - Systemic veins: The IVC measures <2.1 cm, but does not collapse   >50%, suggesting an elevated RA pressure of 8 mmHg. - Pericardium, extracardiac: A trivial pericardial effusion was   identified posterior to the heart.  Impressions:  - LVEF 60-65%, moderate focal basal septal hypertrophy, noraml wall   motion, grade 1 DD< indetermiante LV filling pressure, aortic   valve calcification without signifcant stenosis, MAC with trivial   MR, normal LA Size, prominent eustachian valve, IVC suggests   elevated RA pressure, trivial posterior pericardial effusion.   Patient Profile     72 y.o. male with pneumonia, AKI and NSTEMI  Assessment & Plan    1. Chest pain with elevated troponin: -Pt presented to [facility] on 03/13/2018 with altered mental status. History obtained from family and chart review as patient continues to have issues with ongoing confusion. According to family, over the past several weeks patient has suffered from increasing confusion with suspicion of progression of underlying dementia>>on arrival pain had no complaints of CP however on 03/14/18 he had an episode of chest/epigastirc pain with EKG changes.  -EKG  with ST and ST depression in V leads  -Troponin, elevated at 1.37>1.64>1.63>1.23->1.1 -Echocardiogram shows normal LVEF 60-65% and no regional wall motion abnormalities -Recent stress test 11/2016 considered low risk with no ischemia noted  -Given acute illness, anemia, acute kidney injury, confusion, underlying dementia, the patient being bedridden and overall in  poor shape would treat medically at this time. After stabilization, would consider cardiac catheterization at a later date. Not currently a cath candidate given the above -Continue ASA, statin, BB, add imdur - increase metoprolol to 50 mg po BID - discontinue heparin drip  2. Hypertension: -improved 166/94>164/89>146/76->139/72 -increase metoprolol to 50 mg po BID -continue imdur 30 mg po daily  3. Acute metabolic encephalopathy in the setting of sepsis and pneumonia: -Continue vancomycin and cefepime per primary team -Awaiting cultures>>> strep pneumo and Legionella antigens added on for assessment -Albuterol as needed, per primary team  4. Acute kidney injury: -Creatinine, 1.27>>0.8-1.0 at baseline  -Likely in the setting of acute illness and poor oral intake -IV fluid hydration -Follow renal function closely with daily BMET  5. DM2: -SSI for glucose control while inpatient status -Per primary team  6. Dementia: -Baseline confusion with worsening symptoms over the last 2 to 3 weeks -Questionable formal evaluation as outpatient per chart review  [facility] will sign off.   Medication Recommendations:  As above Other recommendations (labs, testing, etc):  No ischemic workup planned.  Follow up as an outpatient:  As needed if symptomatic.  For questions or updates, please contact [place] Please consult [email or web address] for contact info under     Signed, Ena Dawley, MD  03/16/2018, 8:58 AM

## 2018-03-16 NOTE — Progress Notes (Signed)
PROGRESS NOTE    Jeremy Gill  ZOX:[id number] DOB: [birth year] DOA: 03/13/2018 PCP: [place], [name], MD    Brief Narrative:   Jeremy Gill is a 72 y.o. male with medical history significant of DM with neuropathy, HTN, HLD, BPH who is being brought to the hospital by family due to confusion.  Patient is alert however quite confused, fidgety, and cannot contribute much to the story.  I have discussed with the patient's wife who is at bedside.  She tells me that over the past couple weeks, he has been slightly more confused than his baseline.  She suspects he may have a degree of underlying dementia.  He was hospitalized in August of this year with acute encephalopathy, and improved however still at times even at home when he is at baseline he appears somewhat confused.  She also reports that over the last 2 weeks he has been having a reversal of sleep wake pattern, staying up all night and sleeping during the day.  She denies patient having a fever/chills/feeling hot.  There are no reported abdominal pain chest pain, shortness of breath, nausea vomiting or diarrhea.  He eats "fair" per wife.  He has been complaining of increased lower back pain, he has had this pain for years however over the last month he states that his home tramadol is no longer working.  He continues to smoke a pipe and has been resistant to quitting.  Wife reported that he has a chronic cough but does not feel like it has gotten worse.  ED Course: in the ED patient is found to be septic, tachycardic, tachypneic, with increased WBC. He is confused.Labs show renal failure with Cr 1.77 from prior normal values.Troponin is 0.8, LA 3.9. CXR with concern with multifocal pneumonia. He was given Vanc/Cefepime and we were asked to admit   Assessment & Plan:   Principal Problem:   Sepsis (HCC) Active Problems:   HCAP (healthcare-associated pneumonia)   Diabetes mellitus type 2 with neurological manifestations (HCC)   Diabetic  polyneuropathy (HCC)   Essential hypertension   COPD (chronic obstructive pulmonary disease) (HCC)   Anxiety disorder, unspecified   Acute metabolic encephalopathy   Hyperlipidemia associated with type 2 diabetes mellitus (HCC)   Acute renal failure superimposed on stage 3 chronic kidney disease (HCC)   Anemia   Pressure injury of skin   Protein-calorie malnutrition, severe   Elevated troponin   Abnormal EKG   Chest pain   Oral candidiasis   Dysphagia   Malnutrition of moderate degree   Folate deficiency  1 sepsis secondary to multifocal pneumonia/healthcare associated pneumonia Patient was admitted with sepsis Patient admitted with sepsis with altered mental status, chest x-ray consistent with multifocal pneumonia, patient noted to be tachycardic on admission, with a leukocytosis, elevated lactic acid level.  Urinalysis nitrite negative, leukocytes negative.  Blood cultures obtained and are pending.  MRSA PCR negative.  Urine Legionella antigen negative.  Urine pneumococcus antigen negative.  IV vancomycin has been discontinued.  Continue IV cefepime through today and likely transition to oral antibiotics tomorrow. Continue Mucinex, scheduled nebs, Claritin.  Supportive care.    2.  Elevated troponin/chest pain/EKG changes/NSTEMI On admission patient was noted to have a point-of-care troponin at 0.83.  Cardiac enzymes were cycled with troponins rising to as high as 1.64.  Patient did have some chest pain early on this morning which has since resolved.  EKG done showed some ST depressions in leads V3 through V6.  Elevated troponin  EKG changes may be secondary to demand ischemia however patient with history of COPD, diabetes, hyperlipidemia are concerning for possible acute coronary syndrome.  2D echo with a EF of 60 to 65%, grade 1 diastolic dysfunction.  Serial cardiac enzymes slowly trending down.  Fasting lipid panel with a LDL of 28.  Patient currently on a heparin drip which was  discontinued per cardiology today 03/16/2018.  Patient has been seen in consultation by cardiology who feel that given patient's acute illness, anemia, acute kidney injury, confusion, probable underlying dementia and overall poor shape we will treat medically at this time.  Per cardiology after stabilization would consider cardiac catheterization at a later date in the outpatient setting.  Continue aspirin, beta-blocker, imdur, statin.  Cardiology following and appreciate input and recommendations.    3.  Acute metabolic encephalopathy Likely secondary to problem #1.  Improving slowly.  Patient alert to self, place, knows it is 2019.  Thinks it is August.  Does not know who the president is.  Knows he is at [facility].  Patient has been pancultured.  Continue empiric IV antibiotics.  4.  Oral candidiasis Patient s/p IV fluconazole.  Continue oral Diflucan and nystatin swish and swallow.   5.  Dysphagia Patient with some complaints of dysphagia.  Per wife patient wincing when he tries to swallow.  Patient however denies any food getting stuck.  Patient denies any significant odynophagia.  Patient however is noted to have oral thrush.  Continue fluconazole.  Patient has been seen by speech therapist and patient started on a dysphagia 3 diet.  Follow.    6.  COPD Stable.  Continue Claritin, Dulera and scheduled nebulizer treatments.  7.  Acute kidney injury In the setting of sepsis and poor oral intake.  Improving with hydration.  Continue IV fluids and follow.   8.  Type 2 diabetes mellitus with diabetic neuropathy Hemoglobin A1c was 9.1 on January 14, 2018.  CBG this morning was 426.  Continue to hold oral hypoglycemic agents.  Increase Lantus to 20 units daily.  Place on meal coverage NovoLog 3 units with meals.  Continue sliding scale insulin.   9.  Hypertension Due to swallowing issues patient was placed on IV Lopressor on 03/14/2018.  Patient now on a dysphagia 3 diet and IV  Lopressor has been changed to oral beta-blocker.  Imdur has been added to patient's regimen per cardiology.  Blood pressure improved.  Outpatient follow-up with cardiology.  Follow.   10.  Presumed dementia Per admitting physician per wife's report with unclear baseline.  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  Will likely need outpatient follow-up.  11.  Protein calorie malnutrition Patient with poor oral input.  Patient with complaints of some dysphagia with noted oral candidiasis.  Patient also cachectic on admission.  Albumin level of 2.3.  Patient seen by speech therapy and patient started on a dysphagia 3 diet.  Continue Ensure.   12.  Multiple pressure injuries to right elbow, right medial heel, sacrum with deep tissue pressure injury Patient seen by wound care nurse.  Wound care recommendations made which we will continue.  13.  Anemia/folate deficiency Patient with no overt bleeding.  Likely dilutional component as well.  Anemia panel consistent with folate deficiency.  Hemoglobin at 7.4 this morning from 10.5 on admission.  FOBT negative.  Likely dilutional effect.  Saline lock IV fluids.  Transfuse 2 units packed red blood cells due to an STEMI during this hospitalization.  We will give a dose of Lasix 20 mg IV x1 in between transfusion.  Follow H&H. Transfusion threshold hemoglobin less than 8.  14.  Hypokalemia/hypomagnesemia Replete.  Keep magnesium greater than 2.  Patient requires ongoing inpatient admission due to presentation with sepsis secondary to multifocal healthcare associated pneumonia requiring empiric IV antibiotics.  Patient also noted to have elevated troponins with EKG changes and chest pain concerning for possible acute coronary syndrome versus demand ischemia.  Patient was started  on IV heparin drip.  Cardiology has assessed patient and is following.  Patient is going to be require more than 2 inpatient days and currently on empiric IV antibiotics and empiric treatment.   DVT prophylaxis: Lovenox Code Status: Full Family Communication: Updated patient, wife, mother-in-law at bedside. Disposition Plan: To skilled nursing facility when medically stable.    Consultants:   Cardiology: Dr. Jens Som 03/14/2018  Procedures:   CT head 03/13/2018  Chest x-ray 03/13/2018  2D echo 03/14/2018  2 units packed red blood cells 03/16/2018  Antimicrobials:   IV cefepime 03/13/2018  IV vancomycin 03/13/2018>>>>> 03/15/2018   Subjective: Patient laying in bed.  Feeling better.  Denies chest pain.  Denies shortness of breath.  Feeling better since admission.  No abdominal pain.    Objective: Vitals:   03/15/18 2010 03/16/18 0420 03/16/18 0732 03/16/18 1135  BP:  139/81  136/88  Pulse:  91  85  Resp:  18  18  Temp:  98.3 F (36.8 C)  99 F (37.2 C)  TempSrc:  Oral  Oral  SpO2: 93% 92% 96% 94%  Weight:      Height:        Intake/Output Summary (Last 24 hours) at 03/16/2018 1200 Last data filed at 03/16/2018 0847 Gross per 24 hour  Intake 2826.14 ml  Output 2100 ml  Net 726.14 ml   Filed Weights   03/13/18 1247 03/13/18 1600  Weight: 68 kg 71.6 kg    Examination:  General exam: Appears calm and comfortable. Cachectic. Oropharynx: Oral thrush improving.Marland Kitchen  Poor dentition.  Respiratory system: Decreased coarse/rhonchorous breath sounds. No wheezing.  No crackles.  Normal respiratory effort.  Cardiovascular system: Regular rate and rhythm no murmurs rubs or gallops.  No JVD.  No lower extremity edema.  Gastrointestinal system: Abdomen is soft, nontender, nondistended, positive bowel sounds.  No rebound.  No guarding.   Central nervous system: Alert and oriented to self, place, year. No focal neurological deficits. Extremities: Symmetric 5 x 5  power. Skin: No rashes, lesions or ulcers Psychiatry: Judgement and insight appear poor to fair. Mood & affect appropriate.     Data Reviewed: I have personally reviewed following labs and imaging studies  CBC: Recent Labs  Lab 03/13/18 1317 03/14/18 0334 03/15/18 0734 03/16/18 0103  WBC 12.8* 11.7* 10.8* 10.6*  NEUTROABS 10.5*  --  8.1*  --   HGB 10.5* 9.4* 8.6* 7.4*  HCT 32.6* 28.4* 26.6* 23.1*  MCV 94.8 93.1 92.4 93.5  PLT 263 255 255 236   Basic Metabolic Panel: Recent Labs  Lab 03/13/18 1317 03/14/18 0334 03/15/18 0734 03/16/18 0103  NA 142 142 138 139  K 5.0 3.9 3.2* 3.4*  CL 104 109 105 109  CO2 25 25 23 22   GLUCOSE 218* 87 278* 443*  BUN 52* 41* 28* 26*  CREATININE 1.77* 1.27* 1.03 1.18  CALCIUM 8.7* 8.2* 7.9* 7.9*  MG  --   --  1.3* 1.8   GFR: Estimated Creatinine Clearance: 57.3 mL/min (by C-G formula based on SCr of 1.18 mg/dL). Liver Function Tests: Recent Labs  Lab 03/13/18 1317 03/14/18 0334  AST 25 19  ALT 16 14  ALKPHOS 43 36*  BILITOT 1.2 0.9  PROT 6.7 5.9*  ALBUMIN 2.8* 2.3*   No results for input(s): LIPASE, AMYLASE in the last 168 hours. No results for input(s): AMMONIA in the last 168 hours. Coagulation Profile: Recent Labs  Lab 03/13/18 1317  INR 1.17   Cardiac Enzymes: Recent Labs  Lab 03/13/18 1317  03/13/18 2128 03/14/18 0334 03/14/18 0928 03/14/18 1552 03/15/18 0827  CKTOTAL 94  --   --   --   --   --   --   TROPONINI  --    < > 1.64* 1.63* 1.23* 1.11* 0.81*   < > = values in this interval not displayed.   BNP (last 3 results) No results for input(s): PROBNP in the last 8760 hours. HbA1C: No results for input(s): HGBA1C in the last 72 hours. CBG: Recent Labs  Lab 03/15/18 1219 03/15/18 1608 03/15/18 2129 03/16/18 0748 03/16/18 1137  GLUCAP 273* 256* 351* 426* 264*   Lipid Profile: Recent Labs    03/14/18 0928  CHOL 67  HDL 29*  LDLCALC 28  TRIG 49  CHOLHDL 2.3   Thyroid Function Tests: No  results for input(s): TSH, T4TOTAL, FREET4, T3FREE, THYROIDAB in the last 72 hours. Anemia Panel: Recent Labs    03/16/18 0103  VITAMINB12 (NOTE)  FOLATE 3.7*  FERRITIN 169  TIBC 132*  IRON 21*  RETICCTPCT 1.1   Sepsis Labs: Recent Labs  Lab 03/13/18 1314 03/13/18 1715 03/14/18 0928  LATICACIDVEN 3.92* 2.3* 0.8    Recent Results (from the past 240 hour(s))  Culture, blood (Routine x 2)     Status: None (Preliminary result)   Collection Time: 03/13/18  1:14 PM  Result Value Ref Range Status   Specimen Description   Final    BLOOD RIGHT FOREARM Performed at [facility], [address] [place], [place] [address]    Special Requests   Final    BOTTLES DRAWN AEROBIC AND ANAEROBIC Blood Culture adequate volume Performed at [facility], [address] [place], [place] [address]    Culture   Final    NO GROWTH 2 DAYS Performed at [facility], [address] [place], [place] [address]    Report Status PENDING  Incomplete  Culture, blood (Routine x 2)     Status: None (Preliminary result)   Collection Time: 03/13/18  1:17 PM  Result Value Ref Range Status   Specimen Description   Final    BLOOD LEFT ANTECUBITAL Performed at [facility], [address], [place], [place] [address]    Special Requests   Final    BOTTLES DRAWN AEROBIC AND ANAEROBIC Blood Culture adequate volume Performed at [facility], [address] [place], [place] [address]    Culture   Final    NO GROWTH 2 DAYS Performed at [facility], [address], [place], [place] [address]    Report Status PENDING  Incomplete  Urine culture     Status: None   Collection Time: 03/13/18  1:17 PM  Result Value Ref Range Status   Specimen Description   Final    URINE, CLEAN CATCH Performed  at [facility], [address], [place], [place] [address]    Special Requests   Final    NONE Performed at  [facility], [address] [place], [place] [address]    Culture   Final    NO GROWTH Performed at [facility], [address], [place], [place] [address]    Report Status 03/15/2018 FINAL  Final  MRSA PCR Screening     Status: None   Collection Time: 03/13/18  6:41 PM  Result Value Ref Range Status   MRSA by PCR NEGATIVE NEGATIVE Final    Comment:        The GeneXpert MRSA Assay (FDA approved for NASAL specimens only), is one component of a comprehensive MRSA colonization surveillance program. It is not intended to diagnose MRSA infection nor to guide or monitor treatment for MRSA infections. Performed at [facility], [address] [place], [place] [address]          Radiology Studies: No results found.      Scheduled Meds: . aspirin  81 mg Oral Daily  . atorvastatin  40 mg Oral Daily  . DULoxetine  60 mg Oral Daily  . feeding supplement (ENSURE ENLIVE)  1 Bottle Oral TID BM  . fluconazole  100 mg Oral Daily  . folic acid  1 mg Oral Daily  . furosemide  20 mg Intravenous Once  . gabapentin  300 mg Oral TID  . guaiFENesin  1,200 mg Oral BID  . insulin aspart  0-20 Units Subcutaneous TID WC  . insulin aspart  0-5 Units Subcutaneous QHS  . insulin aspart  3 Units Subcutaneous TID WC  . insulin glargine  20 Units Subcutaneous QHS  . ipratropium  0.5 mg Nebulization TID  . isosorbide mononitrate  30 mg Oral Daily  . levalbuterol  0.63 mg Nebulization TID  . loratadine  10 mg Oral Daily  . metoprolol tartrate  50 mg Oral BID  . mometasone-formoterol  2 puff Inhalation BID  . multivitamin with minerals  1 tablet Oral Daily  . nystatin  5 mL Oral QID  . polyethylene glycol  17 g Oral Daily  . tamsulosin  0.4 mg Oral QPC breakfast   Continuous Infusions: . ceFEPime (MAXIPIME) IV 2 g (03/16/18 0428)     LOS: 2 days    Time spent: 40 minutes    Ramiro Harvest, MD [facility] Pager [phone]  [phone]  If 7PM-7AM, please contact night-coverage [email or web address] Password [email or web address] 03/16/2018, 12:00 PM

## 2018-03-16 NOTE — Progress Notes (Signed)
AM medications were prepared whole in AS per ST recommendation, but pt held pills in mouth and would not swallow them. Pt did swallow Imdur and Flomax, but spit out Mucinex. Pills not yet taken were pulled from pyxis again and medications that could be crushed were crushed. Pt did well with pills crushed in AS.

## 2018-03-16 NOTE — Clinical Social Work Note (Signed)
Clinical Social Work Assessment  Patient Details  Name: Jeremy Gill MRN: [id number] Date of Birth: Oct 15, 1945  Date of referral:  03/16/18               Reason for consult:  Facility Placement                Permission sought to share information with:  Facility Art therapist granted to share information::  Yes, Verbal Permission Granted  Name::     Jaris Kohles  Agency::  SNF  Relationship::  Spouse  Contact Information:  [phone]  Housing/Transportation Living arrangements for the past 2 months:  Single Family Home Source of Information:  Patient, Spouse Patient Interpreter Needed:  None Criminal Activity/Legal Involvement Pertinent to Current Situation/Hospitalization:  No - Comment as needed Significant Relationships:  Adult Children, Spouse, Other Family Members Lives with:  Spouse, Relatives Do you feel safe going back to the place where you live?  Yes Need for family participation in patient care:  Yes (Comment)  Care giving concerns:  Patient lives at home with spouse and mother-in-law. Spouse expresses concern with ability to care for patient at home and agrees that short term rehab will be beneficial to patient for regaining strength before returning home.   Social Worker assessment / plan:  CSW met with patient, spouse Mardene Celeste), and mother-in-law at bedside to discuss discharge plan. Patient's spouse primarily spoke due to patient experiencing confusion. Patient lives at home with spouse and mother-in-law. Spouse reports patient has not been doing well at home and has had multiple hospital stays this year. Patient has son who lives in [place]. He will be coming to see patient and spouse this weekend. Besides son, patient does not have other family members or support nearby.  Patient and family prefers [facility] due to proximity to their home.  CSW explained SNF referral process, will complete FL2, and send out referrals.  Employment  status:  Retired Surveyor, minerals Medicare(HTA) PT Recommendations:  [place] / Referral to community resources:  [place]  Patient/Family's Response to care:  Patient and family appreciative of CSW visit and explanation of process. Spouse asked appropriate questions regarding insurance coverage, CSW explained auth process. She has concerns with insurance not covering rehab stay.   Patient/Family's Understanding of and Emotional Response to Diagnosis, Current Treatment, and Prognosis:  Patient and family understand SNF placement process and knows CSW will f/u with bed offers at a later date.  Emotional Assessment Appearance:  Appears stated age Attitude/Demeanor/Rapport:    Affect (typically observed):  Calm, Pleasant Orientation:  Oriented to Self Alcohol / Substance use:  Not Applicable Psych involvement (Current and /or in the community):  No (Comment)  Discharge Needs  Concerns to be addressed:  Care Coordination Readmission within the last 30 days:  No Current discharge risk:  Physical Impairment Barriers to Discharge:  Continued Medical Work up, [employer], [place] 03/16/2018, 3:08 PM

## 2018-03-16 NOTE — Evaluation (Signed)
Physical Therapy Evaluation Patient Details Name: Jeremy Gill MRN: [id number] DOB: 11/23/1945 Today's Date: 03/16/2018   History of Present Illness  72 yo male admitted with sepsis, acute met encephalopathy, NSTEMI. Hx of dementia, neuropathy, DM, COPD, chronic back pain  Clinical Impression  On eval, pt required Mod assist for mobility. He was able to stand and perform a few pre-gait tasks. Unable to safely attempt ambulation with +1 assist. No family present during session. At this time, recommendation is for ST [facility] at [facility]. Will follow and progress activity as tolerated.     Follow Up Recommendations SNF    Equipment Recommendations  None recommended by PT    Recommendations for Other Services       Precautions / Restrictions Precautions Precautions: Fall Restrictions Weight Bearing Restrictions: No      Mobility  Bed Mobility Overal bed mobility: Needs Assistance Bed Mobility: Supine to Sit     Supine to sit: Mod assist;HOB elevated     General bed mobility comments: Assist for trunk and LEs. Increased time. Multimodal cueing required. Utilized bedpad to aid with positioning,scooting.   Transfers Overall transfer level: Needs assistance Equipment used: Rolling walker (2 wheeled) Transfers: Sit to/from Stand Sit to Stand: Mod assist;From elevated surface         General transfer comment: x 2. Assist to rise, stabilize, control descent. Multimodal cueing required for safety, technique, hand placement.   Ambulation/Gait Ambulation/Gait assistance: Mod assist   Assistive device: Rolling walker (2 wheeled)       General Gait Details: Pre gait tasks:  Marching, Forward/Backwards stepping, Side stepping along side of bed with RW. Pt is very unsteady. Did not attempt ambulation with +1 assist.   Stairs            Wheelchair Mobility    Modified Rankin (Stroke Patients Only)       Balance Overall balance assessment: Needs assistance;History of  Falls         Standing balance support: Bilateral upper extremity supported Standing balance-Leahy Scale: Poor                               Pertinent Vitals/Pain Pain Assessment: Faces Faces Pain Scale: No hurt    Home Living Family/patient expects to be discharged to:: Unsure Living Arrangements: Spouse/significant other Available Help at Discharge: Family Type of Home: House Home Access: Stairs to enter Entrance Stairs-Rails: None Entrance Stairs-Number of Steps: 2 Home Layout: One level Home Equipment: Cane - single point Additional Comments: mother in law home with them.  Information is based on last admission. Pt is having difficulty with PLOF    Prior Function Level of Independence: Needs assistance               Hand Dominance        Extremity/Trunk Assessment   Upper Extremity Assessment Upper Extremity Assessment: Generalized weakness    Lower Extremity Assessment Lower Extremity Assessment: Generalized weakness    Cervical / Trunk Assessment Cervical / Trunk Assessment: Kyphotic  Communication   Communication: No difficulties  Cognition Arousal/Alertness: Awake/alert Behavior During Therapy: WFL for tasks assessed/performed Overall Cognitive Status: History of cognitive impairments - at baseline                                        General Comments  Exercises     Assessment/Plan    PT Assessment Patient needs continued PT services  PT Problem List Decreased strength;Decreased range of motion;Decreased balance;Decreased mobility;Decreased activity tolerance;Pain;Decreased knowledge of use of DME;Decreased cognition;Decreased safety awareness       PT Treatment Interventions DME instruction;Gait training;Functional mobility training;Therapeutic activities;Balance training;Patient/family education;Therapeutic exercise    PT Goals (Current goals can be found in the Care Plan section)  Acute Rehab PT  Goals Patient Stated Goal: none stated. no family present PT Goal Formulation: Patient unable to participate in goal setting Time For Goal Achievement: 04/30/18 Potential to Achieve Goals: Good    Frequency Min 3X/week   Barriers to discharge        Co-evaluation               AM-PAC PT "6 Clicks" Daily Activity  Outcome Measure Difficulty turning over in bed (including adjusting bedclothes, sheets and blankets)?: Unable Difficulty moving from lying on back to sitting on the side of the bed? : Unable Difficulty sitting down on and standing up from a chair with arms (e.g., wheelchair, bedside commode, etc,.)?: Unable Help needed moving to and from a bed to chair (including a wheelchair)?: A Lot Help needed walking in hospital room?: A Lot Help needed climbing 3-5 steps with a railing? : Total 6 Click Score: 8    End of Session Equipment Utilized During Treatment: Gait belt Activity Tolerance: Patient tolerated treatment well Patient left: in bed;with call bell/phone within reach;with bed alarm set   PT Visit Diagnosis: Muscle weakness (generalized) (M62.81);Unsteadiness on feet (R26.81);Difficulty in walking, not elsewhere classified (R26.2)    Time: 7065-8260 PT Time Calculation (min) (ACUTE ONLY): 24 min   Charges:   PT Evaluation $PT Eval Moderate Complexity: 1 Mod PT Treatments $Therapeutic Activity: 8-22 mins          Weston Anna, PT Acute Rehabilitation Services Pager: [phone] Office: [phone]

## 2018-03-16 NOTE — Progress Notes (Signed)
Pharmacy: Re- heparin   Patient's a 72 y.o M currently on heparin for ACS.  Heparin level now back therapeutic at 0.46 (goal 0.3-0.7) at rate of 1450 units/hr.  No bleeding or infusion issues documented.   Plan: - continue heparin drip at 1450 units/hr  - daily heparin level monitoring - monitor for s/s bleeding  Lorenza Evangelist 03/16/2018 2:11 AM

## 2018-03-16 NOTE — NC FL2 (Signed)
[place] MEDICAID FL2 LEVEL OF CARE SCREENING TOOL     IDENTIFICATION  Patient Name: Jeremy Gill Birthdate: December 27, 1945 Sex: male Admission Date (Current Location): 03/13/2018  [facility] and [place] Number:  Producer, television/film/video and Address:  [facility],  [address] [place], [place] [address]      Provider Number: [id number]  Attending Physician Name and Address:  Rodolph Bong, MD  Relative Name and Phone Number:  Ramond Darnell: [phone]    Current Level of Care: Hospital Recommended Level of Care: Skilled Nursing Facility Prior Approval Number:    Date Approved/Denied:   PASRR Number: [id number]  Discharge Plan: SNF    Current Diagnoses: Patient Active Problem List   Diagnosis Date Noted  . Folate deficiency 03/16/2018  . Elevated troponin 03/14/2018  . Abnormal EKG 03/14/2018  . Chest pain 03/14/2018  . Oral candidiasis 03/14/2018  . Dysphagia 03/14/2018  . Malnutrition of moderate degree 03/14/2018  . HCAP (healthcare-associated pneumonia) 03/13/2018  . Abnormality of gait 02/27/2018  . Bilateral lower extremity edema 02/05/2018  . Pruritus of skin 02/05/2018  . Protein-calorie malnutrition, severe 01/16/2018  . Acute urinary retention 01/13/2018  . Acute encephalopathy 01/13/2018  . Obstipation 01/13/2018  . Chronic pain syndrome 11/01/2017  . Pressure injury of skin 10/02/2017  . Anemia   . Depression 09/30/2017  . Acute renal failure superimposed on stage 3 chronic kidney disease (HCC) 09/30/2017  . Sepsis (HCC) 09/30/2017  . Hypokalemia 09/30/2017  . Hyperlipidemia associated with type 2 diabetes mellitus (HCC) 02/05/2017  . AKI (acute kidney injury) (HCC) 12/19/2016  . Acute metabolic encephalopathy 12/19/2016  . DKA (diabetic ketoacidoses) (HCC) 12/18/2016  . Hyperkalemia 12/18/2016  . Anxiety disorder, unspecified 02/08/2016  . Low back pain with radiation 01/23/2016  . Chronic pain disorder 11/30/2015  . Generalized  osteoarthritis of multiple sites 11/30/2015  . DYSHIDROSIS 06/08/2008  . VENOUS INSUFFICIENCY 11/25/2007  . 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 TOBACCO ABUSE 07/08/2007  . Diabetic polyneuropathy (HCC) 04/08/2007  . 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 Essential hypertension 10/25/2006  . COPD (chronic obstructive pulmonary disease) (HCC) 10/25/2006    Orientation RESPIRATION BLADDER Height & Weight     Self  Normal Indwelling catheter Weight: 157 lb 13.6 oz (71.6 kg) Height:  6' (182.9 cm)  BEHAVIORAL SYMPTOMS/MOOD NEUROLOGICAL BOWEL NUTRITION STATUS      Incontinent Diet(Carb modified)  AMBULATORY STATUS COMMUNICATION OF NEEDS Skin   Limited Assist Verbally PU Stage and Appropriate Care(Sacrum, R heel, R elbow, R foot)                       Personal Care Assistance Level of Assistance  Bathing, Feeding, Dressing Bathing Assistance: Limited assistance Feeding assistance: Limited assistance Dressing Assistance: Limited assistance     Functional Limitations Info  Speech, Hearing, Sight Sight Info: Adequate Hearing Info: Adequate Speech Info: Adequate    SPECIAL CARE FACTORS FREQUENCY  OT (By licensed OT), PT (By licensed PT)     PT Frequency: 5x/week OT Frequency: 5x/week            Contractures Contractures Info: Not present    Additional Factors Info  Code Status, Allergies, Psychotropic Code Status Info: Full Allergies Info: NKA Psychotropic Info: Cymbalta         Current Medications (03/16/2018):  This is the current hospital active medication list Current Facility-Administered Medications  Medication Dose Route Frequency Provider Last Rate Last Dose  .  aspirin chewable tablet 81 mg  81 mg Oral Daily Rodolph Bong, MD   81 mg at 03/16/18 0944  . atorvastatin (LIPITOR) tablet 40 mg  40 mg Oral Daily Rodolph Bong, MD   40 mg at 03/16/18 0944  . ceFEPIme (MAXIPIME) 2 g in sodium chloride  0.9 % 100 mL IVPB  2 g Intravenous Q12H Rodolph Bong, MD 200 mL/hr at 03/16/18 0428 2 g at 03/16/18 0428  . DULoxetine (CYMBALTA) DR capsule 60 mg  60 mg Oral Daily Rodolph Bong, MD   60 mg at 03/15/18 1027  . enoxaparin (LOVENOX) injection 30 mg  30 mg Subcutaneous Q24H Rodolph Bong, MD      . feeding supplement (ENSURE ENLIVE) (ENSURE ENLIVE) liquid 237 mL  1 Bottle Oral TID BM Rodolph Bong, MD   237 mL at 03/15/18 2029  . fluconazole (DIFLUCAN) tablet 100 mg  100 mg Oral Daily Rodolph Bong, MD   100 mg at 03/16/18 0944  . folic acid (FOLVITE) tablet 1 mg  1 mg Oral Daily Rodolph Bong, MD   1 mg at 03/16/18 1610  . gabapentin (NEURONTIN) capsule 300 mg  300 mg Oral TID Rodolph Bong, MD   300 mg at 03/16/18 0943  . guaiFENesin (MUCINEX) 12 hr tablet 1,200 mg  1,200 mg Oral BID Rodolph Bong, MD   1,200 mg at 03/15/18 2108  . insulin aspart (novoLOG) injection 0-20 Units  0-20 Units Subcutaneous TID WC Rodolph Bong, MD   11 Units at 03/16/18 1345  . insulin aspart (novoLOG) injection 0-5 Units  0-5 Units Subcutaneous QHS Rodolph Bong, MD      . insulin aspart (novoLOG) injection 3 Units  3 Units Subcutaneous TID WC Rodolph Bong, MD   3 Units at 03/16/18 1346  . insulin glargine (LANTUS) injection 20 Units  20 Units Subcutaneous QHS Rodolph Bong, MD      . ipratropium (ATROVENT) nebulizer solution 0.5 mg  0.5 mg Nebulization Q2H PRN Rodolph Bong, MD      . ipratropium (ATROVENT) nebulizer solution 0.5 mg  0.5 mg Nebulization TID Rodolph Bong, MD   0.5 mg at 03/16/18 1430  . isosorbide mononitrate (IMDUR) 24 hr tablet 30 mg  30 mg Oral Daily Rodolph Bong, MD   30 mg at 03/16/18 1115  . levalbuterol (XOPENEX) nebulizer solution 0.63 mg  0.63 mg Nebulization Q2H PRN Rodolph Bong, MD      . levalbuterol Pauline Aus) nebulizer solution 0.63 mg  0.63 mg Nebulization TID Rodolph Bong, MD   0.63 mg at 03/16/18  1430  . loratadine (CLARITIN) tablet 10 mg  10 mg Oral Daily Rodolph Bong, MD   10 mg at 03/16/18 0943  . metoprolol tartrate (LOPRESSOR) tablet 50 mg  50 mg Oral BID Lars Masson, MD   50 mg at 03/16/18 0944  . mometasone-formoterol (DULERA) 100-5 MCG/ACT inhaler 2 puff  2 puff Inhalation BID Rodolph Bong, MD   2 puff at 03/16/18 0732  . multivitamin with minerals tablet 1 tablet  1 tablet Oral Daily Rodolph Bong, MD   1 tablet at 03/16/18 0945  . nystatin (MYCOSTATIN) 100000 UNIT/ML suspension 500,000 Units  5 mL Oral QID Rodolph Bong, MD   500,000 Units at 03/16/18 1345  . polyethylene glycol (MIRALAX / GLYCOLAX) packet 17 g  17 g Oral Daily Rodolph Bong, MD   17 g  at 03/16/18 6269  . RESOURCE THICKENUP CLEAR   Oral PRN Rodolph Bong, MD      . tamsulosin [facility]) capsule 0.4 mg  0.4 mg Oral QPC breakfast Rodolph Bong, MD   0.4 mg at 03/16/18 4854  . traMADol (ULTRAM) tablet 50 mg  50 mg Oral Q6H PRN Rodolph Bong, MD   50 mg at 03/15/18 2109     Discharge Medications: Please see discharge summary for a list of discharge medications.  Relevant Imaging Results:  Relevant Lab Results:   Additional Information SSN: [id number]  Enid Cutter, [place]

## 2018-03-17 ENCOUNTER — Inpatient Hospital Stay (HOSPITAL_COMMUNITY): Payer: PPO

## 2018-03-17 ENCOUNTER — Other Ambulatory Visit: Payer: Self-pay

## 2018-03-17 DIAGNOSIS — E538 Deficiency of other specified B group vitamins: Secondary | ICD-10-CM

## 2018-03-17 DIAGNOSIS — L89619 Pressure ulcer of right heel, unspecified stage: Secondary | ICD-10-CM

## 2018-03-17 LAB — TYPE AND SCREEN
ABO/RH(D): A POS
Antibody Screen: NEGATIVE
UNIT DIVISION: 0
UNIT DIVISION: 0

## 2018-03-17 LAB — BASIC METABOLIC PANEL
Anion gap: 9 (ref 5–15)
BUN: 26 mg/dL — AB (ref 8–23)
CALCIUM: 8.1 mg/dL — AB (ref 8.9–10.3)
CO2: 24 mmol/L (ref 22–32)
Chloride: 108 mmol/L (ref 98–111)
Creatinine, Ser: 1.07 mg/dL (ref 0.61–1.24)
GFR calc Af Amer: >60 mL/min (ref >60)
Glucose, Bld: 328 mg/dL — ABNORMAL HIGH (ref 70–99)
Potassium: 3 mmol/L — ABNORMAL LOW (ref 3.5–5.1)
Sodium: 141 mmol/L (ref 135–145)

## 2018-03-17 LAB — BPAM RBC
BLOOD PRODUCT EXPIRATION DATE: 201911182359
Blood Product Expiration Date: 201911182359
ISSUE DATE / TIME: 201910201146
ISSUE DATE / TIME: 201910201523
Unit Type and Rh: 6200
Unit Type and Rh: 6200

## 2018-03-17 LAB — GLUCOSE, CAPILLARY
Glucose-Capillary: 305 mg/dL — ABNORMAL HIGH (ref 70–99)
Glucose-Capillary: 239 mg/dL — ABNORMAL HIGH (ref 70–99)
Glucose-Capillary: 203 mg/dL — ABNORMAL HIGH (ref 70–99)
Glucose-Capillary: 328 mg/dL — ABNORMAL HIGH (ref 70–99)

## 2018-03-17 LAB — CBC
HCT: 29.8 % — ABNORMAL LOW (ref 39.0–52.0)
Hemoglobin: 9.7 g/dL — ABNORMAL LOW (ref 13.0–17.0)
MCH: 29.8 pg (ref 26.0–34.0)
MCHC: 32.6 g/dL (ref 30.0–36.0)
MCV: 91.7 fL (ref 80.0–100.0)
PLATELETS: 236 10*3/uL (ref 150–400)
RBC: 3.25 MIL/uL — ABNORMAL LOW (ref 4.22–5.81)
RDW: 14.3 % (ref 11.5–15.5)
WBC: 12.6 10*3/uL — ABNORMAL HIGH (ref 4.0–10.5)
nRBC: 0 % (ref 0.0–0.2)

## 2018-03-17 LAB — POTASSIUM: Potassium: 2.6 mmol/L — CL (ref 3.5–5.1)

## 2018-03-17 LAB — MAGNESIUM: MAGNESIUM: 1.7 mg/dL (ref 1.7–2.4)

## 2018-03-17 MED ORDER — POTASSIUM CHLORIDE CRYS ER 20 MEQ PO TBCR
40.0000 meq | EXTENDED_RELEASE_TABLET | ORAL | Status: AC
  Administered 2018-03-17: 40 meq via ORAL
  Filled 2018-03-17: qty 2

## 2018-03-17 MED ORDER — CEFDINIR 300 MG PO CAPS
300.0000 mg | ORAL_CAPSULE | Freq: Two times a day (BID) | ORAL | Status: DC
  Administered 2018-03-17 – 2018-03-18 (×3): 300 mg via ORAL
  Filled 2018-03-17 (×3): qty 1

## 2018-03-17 MED ORDER — SODIUM CHLORIDE 0.9 % IV SOLN
1.0000 g | Freq: Three times a day (TID) | INTRAVENOUS | Status: DC
  Filled 2018-03-17: qty 1

## 2018-03-17 MED ORDER — MAGNESIUM SULFATE 4 GM/100ML IV SOLN
4.0000 g | Freq: Once | INTRAVENOUS | Status: AC
  Administered 2018-03-17: 4 g via INTRAVENOUS
  Filled 2018-03-17: qty 100

## 2018-03-17 MED ORDER — INSULIN GLARGINE 100 UNIT/ML ~~LOC~~ SOLN
24.0000 [IU] | Freq: Every day | SUBCUTANEOUS | Status: DC
  Administered 2018-03-17: 24 [IU] via SUBCUTANEOUS
  Filled 2018-03-17: qty 0.24

## 2018-03-17 MED ORDER — POTASSIUM CHLORIDE CRYS ER 20 MEQ PO TBCR
40.0000 meq | EXTENDED_RELEASE_TABLET | ORAL | Status: AC
  Administered 2018-03-17 – 2018-03-18 (×3): 40 meq via ORAL
  Filled 2018-03-17 (×3): qty 2

## 2018-03-17 NOTE — Progress Notes (Signed)
Pharmacy Antibiotic Note  Jeremy Gill is a 72 y.o. male admitted on 03/13/2018 with pneumonia.  Pharmacy has been consulted for vancomycin and cefepiome dosing.  03/17/18 Day #4 Cefepime  SCr significantly improved with hydration  Afebrile  WBC 12.6  Plan: Adjust cefepime to 1g IV q8h Suggest cefdinir 300mg  PO bid to complete abx course   Height: 6' (182.9 cm) Weight: 157 lb 13.6 oz (71.6 kg) IBW/kg (Calculated) : 77.6  Temp (24hrs), Avg:98.8 F (37.1 C), Min:98.5 F (36.9 C), Max:99 F (37.2 C)  Recent Labs  Lab 03/13/18 1314 03/13/18 1317 03/13/18 1715 03/14/18 0334 03/14/18 0928 03/15/18 0734 03/16/18 0103 03/17/18 0450  WBC  --  12.8*  --  11.7*  --  10.8* 10.6* 12.6*  CREATININE  --  1.77*  --  1.27*  --  1.03 1.18 1.07  LATICACIDVEN 3.92*  --  2.3*  --  0.8  --   --   --     Estimated Creatinine Clearance: 63.2 mL/min (by C-G formula based on SCr of 1.07 mg/dL).    No Known Allergies  Antimicrobials this admission: 10/17 vanc >> 10/19 10/17 cefepime >> 10/18 fluconazole >> 10/18 nystatin >>  Dose adjustments this admission: 10/17: increase cefepime 2 g q12h;  Increase vancomycin 1g q24h 10/21: adjust cefepime to 1g IV q8h  Microbiology results: 10/17 BCx: NGTD 10/17 UCx:  NGF 10/17 MRSA PCR: negative  Thank you for allowing pharmacy to be a part of this patient's care.  Loralee Pacas, PharmD, BCPS Pager: [phone] 03/17/2018 7:37 AM

## 2018-03-17 NOTE — Progress Notes (Signed)
PROGRESS NOTE    Jeremy Gill  XBJ:[id number] DOB: 06-Mar-1946 DOA: 03/13/2018 PCP: [place], [name], MD    Brief Narrative:   Jeremy Gill is a 72 y.o. male with medical history significant of DM with neuropathy, HTN, HLD, BPH who is being brought to the hospital by family due to confusion.  Patient is alert however quite confused, fidgety, and cannot contribute much to the story.  I have discussed with the patient's wife who is at bedside.  She tells me that over the past couple weeks, he has been slightly more confused than his baseline.  She suspects he may have a degree of underlying dementia.  He was hospitalized in August of this year with acute encephalopathy, and improved however still at times even at home when he is at baseline he appears somewhat confused.  She also reports that over the last 2 weeks he has been having a reversal of sleep wake pattern, staying up all night and sleeping during the day.  She denies patient having a fever/chills/feeling hot.  There are no reported abdominal pain chest pain, shortness of breath, nausea vomiting or diarrhea.  He eats "fair" per wife.  He has been complaining of increased lower back pain, he has had this pain for years however over the last month he states that his home tramadol is no longer working.  He continues to smoke a pipe and has been resistant to quitting.  Wife reported that he has a chronic cough but does not feel like it has gotten worse.  ED Course: in the ED patient is found to be septic, tachycardic, tachypneic, with increased WBC. He is confused.Labs show renal failure with Cr 1.77 from prior normal values.Troponin is 0.8, LA 3.9. CXR with concern with multifocal pneumonia. He was given Vanc/Cefepime and we were asked to admit   Assessment & Plan:   Principal Problem:   Sepsis (HCC) Active Problems:   HCAP (healthcare-associated pneumonia)   Diabetes mellitus type 2 with neurological manifestations (HCC)   Diabetic  polyneuropathy (HCC)   Essential hypertension   COPD (chronic obstructive pulmonary disease) (HCC)   Anxiety disorder, unspecified   Acute metabolic encephalopathy   Hyperlipidemia associated with type 2 diabetes mellitus (HCC)   Acute renal failure superimposed on stage 3 chronic kidney disease (HCC)   Anemia   Pressure injury of skin   Protein-calorie malnutrition, severe   Elevated troponin   Abnormal EKG   Chest pain   Oral candidiasis   Dysphagia   Malnutrition of moderate degree   Folate deficiency  1 sepsis secondary to multifocal pneumonia/healthcare associated pneumonia Patient was admitted with sepsis Patient admitted with sepsis with altered mental status, chest x-ray consistent with multifocal pneumonia, patient noted to be tachycardic on admission, with a leukocytosis, elevated lactic acid level.  Urinalysis nitrite negative, leukocytes negative.  Blood cultures obtained and are pending.  MRSA PCR negative.  Urine Legionella antigen negative.  Urine pneumococcus antigen negative.  IV vancomycin has been discontinued.  Change IV cefepime to oral Vantin to complete course of antibiotic treatment.  Continue scheduled nebs, Claritin, Mucinex.  Supportive care.   2.  Elevated troponin/chest pain/EKG changes/NSTEMI On admission patient was noted to have a point-of-care troponin at 0.83.  Cardiac enzymes were cycled with troponins rising to as high as 1.64.  Patient did have some chest pain early on this morning which has since resolved.  EKG done showed some ST depressions in leads V3 through V6.  Elevated troponin  EKG changes may be secondary to demand ischemia however patient with history of COPD, diabetes, hyperlipidemia are concerning for possible acute coronary syndrome.  2D echo with a EF of 60 to 65%, grade 1 diastolic dysfunction.  Serial cardiac enzymes slowly trending down.  Fasting lipid panel with a LDL of 28.  Patient currently on a heparin drip which was discontinued per  cardiology today 03/16/2018.  Patient has been seen in consultation by cardiology who feel that given patient's acute illness, anemia, acute kidney injury, confusion, probable underlying dementia and overall poor shape we will treat medically at this time.  Per cardiology after stabilization would consider cardiac catheterization at a later date in the outpatient setting.  Continue aspirin, beta-blocker, imdur, statin.  Cardiology following and appreciate input and recommendations.    3.  Acute metabolic encephalopathy Likely secondary to problem #1.  Improving slowly.  Patient alert to self, place, knows it is 2019.  Thinks it is August.  Does not know who the president is.  Knows he is at [facility].  Patient has been pancultured.  Continue empiric antibiotics.  4.  Oral candidiasis Patient s/p IV fluconazole.  Continue oral Diflucan and nystatin swish and swallow.   5.  Dysphagia Patient with some complaints of dysphagia.  Per wife patient wincing when he tries to swallow.  Patient however denies any food getting stuck.  Patient denies any significant odynophagia.  Patient however is noted to have oral thrush.  Continue fluconazole.  Patient has been seen by speech therapist and patient started on a dysphagia 3 diet.  Patient for modified barium swallow.  Follow.    6.  COPD Stable.  Continue Claritin, Dulera and scheduled nebulizer treatments.  7.  Acute kidney injury In the setting of sepsis and poor oral intake.  Improved with hydration.  Continue IV fluids and follow.   8.  Type 2 diabetes mellitus with diabetic neuropathy Hemoglobin A1c was 9.1 on January 14, 2018.  CBG this morning was 426.  Continue to hold oral hypoglycemic agents.  Increase Lantus to 20 units daily.  Place on meal coverage NovoLog 3 units with meals.  Continue sliding scale insulin.   9.  Hypertension Due to swallowing issues patient was initially placed on IV Lopressor on 03/14/2018.  Patient now on a  dysphagia 3 diet and IV Lopressor has been changed to oral beta-blocker.  Imdur has been added to patient's regimen per cardiology.  Blood pressure improved.  Outpatient follow-up with cardiology.  Follow.   10.  Presumed dementia Per admitting physician per wife's report with unclear baseline.  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  Will likely need outpatient follow-up.  11.  Severe protein calorie malnutrition Patient with poor oral input.  Patient with complaints of some dysphagia with noted oral candidiasis.  Patient also cachectic on admission.  Albumin level of 2.3.  Patient seen by speech therapy and patient started on a dysphagia 3 diet.  Patient for modified barium swallow today.  Continue Ensure.   12.  Multiple pressure injuries to right elbow, right medial heel, sacrum with deep tissue pressure injury Patient seen by wound care nurse.  Wound care recommendations made which we will continue.  13.  Anemia/folate deficiency Patient with no overt bleeding.  Likely dilutional component as well.  Anemia panel consistent  with folate deficiency.  Hemoglobin at 7.4 on 03/16/2018 from 10.5 on admission.  FOBT negative.  Likely dilutional effect.  Saline lock IV fluids.  Status post 2 units packed red blood cells hemoglobin currently at 9.7.  Follow H&H. Transfusion threshold hemoglobin less than 8.  14.  Hypokalemia/hypomagnesemia Replete.  Keep magnesium greater than 2.  Patient requires ongoing inpatient admission due to presentation with sepsis secondary to multifocal healthcare associated pneumonia requiring empiric IV antibiotics.  Patient also noted to have elevated troponins with EKG changes and chest pain concerning for possible acute coronary syndrome versus demand ischemia.  Patient was started on  IV heparin drip.  Cardiology has assessed patient and is following.  Patient is going to be require more than 2 inpatient days and currently on empiric IV antibiotics and empiric treatment.   DVT prophylaxis: Lovenox Code Status: Full Family Communication: Updated patient, wife, mother-in-law at bedside. Disposition Plan: To skilled nursing facility when medically stable hopefully tomorrow.    Consultants:   Cardiology: Dr. Jens Som 03/14/2018  Procedures:   CT head 03/13/2018  Chest x-ray 03/13/2018  2D echo 03/14/2018  2 units packed red blood cells 03/16/2018  Modified barium swallow 03/17/2018  Antimicrobials:   IV cefepime 03/13/2018>>>>>>>> 03/17/2018  IV vancomycin 03/13/2018>>>>> 03/15/2018  Oral Vantin 03/17/2018   Subjective: Patient in bed.  Denies chest pain.  No shortness of breath.  No abdominal pain.  States he is tolerating oral intake.   Objective: Vitals:   03/16/18 2034 03/16/18 2109 03/17/18 0434 03/17/18 0744  BP:  137/79 (!) 162/87   Pulse:  90 87   Resp:  16 14   Temp:  98.5 F (36.9 C) 98.6 F (37 C)   TempSrc:  Oral Oral   SpO2: 95% 95% 94% 93%  Weight:      Height:        Intake/Output Summary (Last 24 hours) at 03/17/2018 1039 Last data filed at 03/17/2018 0700 Gross per 24 hour  Intake 1805.81 ml  Output 3550 ml  Net -1744.19 ml   Filed Weights   03/13/18 1247 03/13/18 1600  Weight: 68 kg 71.6 kg    Examination:  General exam: Appears calm and comfortable. Cachectic. Oropharynx: Oral thrush improved.  Poor dentition.  Respiratory system: Decreasing coarse breath sounds/rhonchorous sounds.  No wheezes.  No crackles.  Normal respiratory effort.  Cardiovascular system: RRR no murmurs rubs or gallops.  No JVD.  No lower extremity edema.  Gastrointestinal system: Abdomen is nontender, nondistended, soft, positive bowel sounds.  No rebound.  No guarding.   Central nervous system: Alert and oriented to self, place, year. No  focal neurological deficits. Extremities: Symmetric 5 x 5 power. Skin: No rashes, lesions or ulcers Psychiatry: Judgement and insight appear poor to fair. Mood & affect appropriate.     Data Reviewed: I have personally reviewed following labs and imaging studies  CBC: Recent Labs  Lab 03/13/18 1317 03/14/18 0334 03/15/18 0734 03/16/18 0103 03/16/18 2120 03/17/18 0450  WBC 12.8* 11.7* 10.8* 10.6*  --  12.6*  NEUTROABS 10.5*  --  8.1*  --   --   --   HGB 10.5* 9.4* 8.6* 7.4* 9.5* 9.7*  HCT 32.6* 28.4* 26.6* 23.1* 28.7* 29.8*  MCV 94.8 93.1 92.4 93.5  --  91.7  PLT 263 255 255 236  --  236   Basic Metabolic Panel: Recent Labs  Lab 03/13/18 1317 03/14/18 0334 03/15/18 0734 03/16/18 0103 03/17/18 0450  NA 142 142 138 139 141  K 5.0 3.9 3.2* 3.4* 3.0*  CL 104 109 105 109 108  CO2 25 25 23 22 24   GLUCOSE 218* 87 278* 443* 328*  BUN 52* 41* 28* 26* 26*  CREATININE 1.77* 1.27* 1.03 1.18 1.07  CALCIUM 8.7* 8.2* 7.9* 7.9* 8.1*  MG  --   --  1.3* 1.8 1.7   GFR: Estimated Creatinine Clearance: 63.2 mL/min (by C-G formula based on SCr of 1.07 mg/dL). Liver Function Tests: Recent Labs  Lab 03/13/18 1317 03/14/18 0334  AST 25 19  ALT 16 14  ALKPHOS 43 36*  BILITOT 1.2 0.9  PROT 6.7 5.9*  ALBUMIN 2.8* 2.3*   No results for input(s): LIPASE, AMYLASE in the last 168 hours. No results for input(s): AMMONIA in the last 168 hours. Coagulation Profile: Recent Labs  Lab 03/13/18 1317  INR 1.17   Cardiac Enzymes: Recent Labs  Lab 03/13/18 1317  03/13/18 2128 03/14/18 0334 03/14/18 0928 03/14/18 1552 03/15/18 0827  CKTOTAL 94  --   --   --   --   --   --   TROPONINI  --    < > 1.64* 1.63* 1.23* 1.11* 0.81*   < > = values in this interval not displayed.   BNP (last 3 results) No results for input(s): PROBNP in the last 8760 hours. HbA1C: No results for input(s): HGBA1C in the last 72 hours. CBG: Recent Labs  Lab 03/16/18 0748 03/16/18 1137 03/16/18 1625  03/16/18 2110 03/17/18 0856  GLUCAP 426* 264* 121* 195* 305*   Lipid Profile: No results for input(s): CHOL, HDL, LDLCALC, TRIG, CHOLHDL, LDLDIRECT in the last 72 hours. Thyroid Function Tests: No results for input(s): TSH, T4TOTAL, FREET4, T3FREE, THYROIDAB in the last 72 hours. Anemia Panel: Recent Labs    03/16/18 0103  VITAMINB12 (NOTE)  FOLATE 3.7*  FERRITIN 169  TIBC 132*  IRON 21*  RETICCTPCT 1.1   Sepsis Labs: Recent Labs  Lab 03/13/18 1314 03/13/18 1715 03/14/18 0928  LATICACIDVEN 3.92* 2.3* 0.8    Recent Results (from the past 240 hour(s))  Culture, blood (Routine x 2)     Status: None (Preliminary result)   Collection Time: 03/13/18  1:14 PM  Result Value Ref Range Status   Specimen Description   Final    BLOOD RIGHT FOREARM Performed at [facility], [address] [place], [place] [address]    Special Requests   Final    BOTTLES DRAWN AEROBIC AND ANAEROBIC Blood Culture adequate volume Performed at [facility], [address] [place], [place] [address]    Culture   Final    NO GROWTH 4 DAYS Performed at [facility], [address] [place], [place] [address]    Report Status PENDING  Incomplete  Culture, blood (Routine x 2)     Status: None (Preliminary result)   Collection Time: 03/13/18  1:17 PM  Result Value Ref Range Status   Specimen Description   Final    BLOOD LEFT ANTECUBITAL Performed at [facility], [address] [place], [place] [address]    Special Requests   Final    BOTTLES DRAWN AEROBIC AND ANAEROBIC Blood Culture adequate volume Performed at [facility], [address], [place], [place] [address]    Culture   Final    NO GROWTH 4 DAYS Performed at [facility], [address] [place], [place] [address]    Report Status PENDING  Incomplete  Urine  culture     Status: None   Collection Time: 03/13/18  1:17 PM  Result Value Ref Range  Status   Specimen Description   Final    URINE, CLEAN CATCH Performed at [facility], [address] [place], [place] [address]    Special Requests   Final    NONE Performed at [facility], [address], [place], [place] [address]    Culture   Final    NO GROWTH Performed at [facility], [address], [place], [place] [address]    Report Status 03/15/2018 FINAL  Final  MRSA PCR Screening     Status: None   Collection Time: 03/13/18  6:41 PM  Result Value Ref Range Status   MRSA by PCR NEGATIVE NEGATIVE Final    Comment:        The GeneXpert MRSA Assay (FDA approved for NASAL specimens only), is one component of a comprehensive MRSA colonization surveillance program. It is not intended to diagnose MRSA infection nor to guide or monitor treatment for MRSA infections. Performed at [facility], [address] [place], [place] [address]          Radiology Studies: No results found.      Scheduled Meds: . aspirin  81 mg Oral Daily  . atorvastatin  40 mg Oral Daily  . cefdinir  300 mg Oral Q12H  . DULoxetine  60 mg Oral Daily  . enoxaparin (LOVENOX) injection  30 mg Subcutaneous Q24H  . feeding supplement (ENSURE ENLIVE)  1 Bottle Oral TID BM  . fluconazole  100 mg Oral Daily  . folic acid  1 mg Oral Daily  . gabapentin  300 mg Oral TID  . guaiFENesin  1,200 mg Oral BID  . insulin aspart  0-20 Units Subcutaneous TID WC  . insulin aspart  0-5 Units Subcutaneous QHS  . insulin aspart  3 Units Subcutaneous TID WC  . insulin glargine  24 Units Subcutaneous QHS  . ipratropium  0.5 mg Nebulization TID  . isosorbide mononitrate  30 mg Oral Daily  . levalbuterol  0.63 mg Nebulization TID  . loratadine  10 mg Oral Daily  . metoprolol tartrate  50 mg Oral BID  . mometasone-formoterol  2 puff Inhalation BID  . multivitamin with minerals  1 tablet Oral Daily  . nystatin  5 mL Oral QID  .  polyethylene glycol  17 g Oral Daily  . potassium chloride  40 mEq Oral Q4H  . tamsulosin  0.4 mg Oral QPC breakfast   Continuous Infusions: . magnesium sulfate 1 - 4 g bolus IVPB       LOS: 3 days    Time spent: 40 minutes    Ramiro Harvest, MD [facility] Pager [phone]  If 7PM-7AM, please contact night-coverage [email or web address] Password [email or web address] 03/17/2018, 10:39 AM

## 2018-03-17 NOTE — Care Management Important Message (Signed)
Important Message  Patient Details  Name: Jeremy Gill MRN: [id number] Date of Birth: April 28, 1946   Medicare Important Message Given:  Yes    Caren Macadam 03/17/2018, 2:02 PMImportant Message  Patient Details  Name: Jeremy Gill MRN: [id number] Date of Birth: 09-Mar-1946   Medicare Important Message Given:  Yes    Caren Macadam 03/17/2018, 2:02 PM

## 2018-03-17 NOTE — Progress Notes (Signed)
Objective Swallowing Evaluation: Type of Study: MBS-Modified Barium Swallow Study   Patient Details  Name: Jeremy Gill MRN: [id number] Date of Birth: February 12, 1946  Today's Date: 03/17/2018 Time: SLP Start Time (ACUTE ONLY): 1145 -SLP Stop Time (ACUTE ONLY): 1214  SLP Time Calculation (min) (ACUTE ONLY): 29 min   Past Medical History:  Past Medical History:  Diagnosis Date  . ANKLE SPRAIN 12/16/2006   Qualifier: Diagnosis of  By: Tawanna Cooler MD, Eugenio Hoes   . ANXIETY 10/25/2006  . CELLULITIS, LEG, RIGHT 09/08/2007   Qualifier: Diagnosis of  By: Tawanna Cooler MD, Eugenio Hoes   . Chronic pain disorder 11/30/2015  . COPD 10/25/2006  . 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 DIABETES MELLITUS, TYPE I 10/25/2006  . Diabetic polyneuropathy (HCC) 04/08/2007   Qualifier: Diagnosis of  By: Tawanna Cooler MD, Eugenio Hoes   . 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 Essential hypertension 10/25/2006   Qualifier: Diagnosis of  By: Rosette Reveal RN, Jorene Minors   . Generalized osteoarthritis of multiple sites 11/30/2015  . HYPERTENSION 10/25/2006  . 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 Low back pain with radiation 01/23/2016  . Polyneuropathy in diabetes(357.2) 04/08/2007  . Psoriasis   . TOBACCO ABUSE 07/08/2007  . VENOUS INSUFFICIENCY 11/25/2007   Past Surgical History:  Past Surgical History:  Procedure Laterality Date  . TESTICLE REMOVAL     R testicle   HPI: 72 yo male adm to [facility] with HCAP.  PMH + for HTN, DM, chronic back pain - progressive encephalopathy over the last few weeks - wife ?s mild dementia per MD note.  Pt found to have increased troponins also and cardiology consult conducted.  Pt CT head negative.  CXR showed LLL posterior density = ? septic emboli vs multifocal pna.  ? oral thrush pt being treated= also as h/o hypertrophic tongue pallilae.  Swallow evaluation ordered.      Subjective: pt awake in chair    Assessment / Plan / Recommendation  CHL IP CLINICAL IMPRESSIONS 03/14/2018  Clinical Impression Pt presents with moderate oropharyngeal dysphagia with sensorimotor deficits.  Pt did have audible aspiration of thin and nectar with sequential swallows due to decreased adequacy of airway closure/protection.  Aspirates were too deep to allow clearance.  Pt also with residuals in pharynx that he does NOT sense nor clears with "hock" or liquid swallows.     Dry swallows were not able to be performed by pt = ? due to motor planning and/or cognition.  Head turn right did not decrease pharyngeal residuals that were worse with increased viscocity.  Chin tuck worsened airway protection as it spilled barium into larynx from pyriform sinus.  Also cues of counting "1,2,3" did not help pt to initiate a swallow or orally transit more efficiently.     Suspect pt has some level of chronic dysphagia/aspiration.  Mitigation strategies reviewed with wife and pt using images from study during and immediately post-test.  Recommend follow up at SNF for RMST to help strengthen swallow musculature.    SLP Visit Diagnosis Dysphagia, oropharyngeal phase (R13.12)  Attention and concentration deficit following --  Frontal lobe and executive function deficit following --  Impact on safety and function Moderate aspiration risk      CHL IP TREATMENT RECOMMENDATION 03/14/2018  Treatment Recommendations Therapy as outlined in treatment plan below  Prognosis 03/17/2018  Prognosis for Safe Diet Advancement Fair  Barriers to Reach Goals Time post onset  Barriers/Prognosis Comment --    CHL IP DIET RECOMMENDATION 03/14/2018  SLP Diet Recommendations Dysphagia 2 (Fine chop) solids;Nectar thick liquid;Other (Comment)  Liquid Administration via Cup;No straw;Spoon  Medication Administration Whole meds with puree  Compensations Minimize environmental distractions;Slow rate;Small  sips/bites;Other (Comment);Effortful swallow;Follow solids with liquid  Postural Changes Remain semi-upright after after feeds/meals (Comment);Seated upright at 90 degrees      CHL IP OTHER RECOMMENDATIONS 03/14/2018  Recommended Consults --  Oral Care Recommendations Oral care BID  Other Recommendations Order thickener from pharmacy      CHL IP FOLLOW UP RECOMMENDATIONS 03/14/2018  Follow up Recommendations Skilled Nursing facility      [facility] IP FREQUENCY AND DURATION 03/14/2018  Speech Therapy Frequency (ACUTE ONLY) min 2x/week  Treatment Duration 1 week           CHL IP ORAL PHASE 03/17/2018  Oral Phase Impaired  Oral - Pudding Teaspoon --  Oral - Pudding Cup --  Oral - Honey Teaspoon --  Oral - Honey Cup --  Oral - Nectar Teaspoon Decreased bolus cohesion;Piecemeal swallowing;Weak lingual manipulation;Reduced posterior propulsion  Oral - Nectar Cup Decreased bolus cohesion;Premature spillage;Piecemeal swallowing;Weak lingual manipulation;Lingual pumping  Oral - Nectar Straw Decreased bolus cohesion;Weak lingual manipulation;Premature spillage;Piecemeal swallowing  Oral - Thin Teaspoon Decreased bolus cohesion;Premature spillage;Lingual pumping;Weak lingual manipulation;Piecemeal swallowing  Oral - Thin Cup Decreased bolus cohesion;Lingual pumping;Premature spillage;Weak lingual manipulation;Piecemeal swallowing  Oral - Thin Straw Decreased bolus cohesion;Weak lingual manipulation;Lingual pumping;Premature spillage;Piecemeal swallowing  Oral - Puree Decreased bolus cohesion;Weak lingual manipulation;Lingual pumping  Oral - Mech Soft Decreased bolus cohesion;Impaired mastication;Weak lingual manipulation;Lingual pumping  Oral - Regular --  Oral - Multi-Consistency --  Oral - Pill --  Oral Phase - Comment counting 1,2,3 did not help pt to initiate a swallow or orally transit more efficiently     CHL IP PHARYNGEAL PHASE 03/17/2018  Pharyngeal Phase Impaired  Pharyngeal-  Pudding Teaspoon --  Pharyngeal --  Pharyngeal- Pudding Cup --  Pharyngeal --  Pharyngeal- Honey Teaspoon --  Pharyngeal --  Pharyngeal- Honey Cup --  Pharyngeal --  Pharyngeal- Nectar Teaspoon Reduced pharyngeal peristalsis;Reduced epiglottic inversion;Reduced laryngeal elevation;Reduced airway/laryngeal closure;Pharyngeal residue - valleculae;Pharyngeal residue - pyriform  Pharyngeal --  Pharyngeal- Nectar Cup Reduced laryngeal elevation;Reduced airway/laryngeal closure;Reduced pharyngeal peristalsis;Reduced tongue base retraction;Penetration/Aspiration during swallow;Pharyngeal residue - valleculae;Pharyngeal residue - pyriform  Pharyngeal Material enters airway, remains ABOVE vocal cords and not ejected out  Pharyngeal- Nectar Straw Reduced epiglottic inversion;Reduced anterior laryngeal mobility;Reduced laryngeal elevation;Reduced airway/laryngeal closure;Reduced tongue base retraction;Pharyngeal residue - valleculae;Pharyngeal residue - pyriform;Penetration/Apiration after swallow  Pharyngeal Material enters airway, passes BELOW cords and not ejected out despite cough attempt by patient  Pharyngeal- Thin Teaspoon Reduced tongue base retraction;Reduced airway/laryngeal closure;Reduced pharyngeal peristalsis;Reduced laryngeal elevation;Pharyngeal residue - valleculae;Pharyngeal residue - pyriform  Pharyngeal --  Pharyngeal- Thin Cup Reduced laryngeal elevation;Reduced pharyngeal peristalsis;Reduced tongue base retraction;Reduced epiglottic inversion;Penetration/Aspiration during swallow;Reduced airway/laryngeal closure;Pharyngeal residue - valleculae;Pharyngeal residue - pyriform  Pharyngeal Material enters airway, CONTACTS cords and not ejected out  Pharyngeal- Thin Straw --  Pharyngeal --  Pharyngeal- Puree Reduced pharyngeal peristalsis;Reduced epiglottic inversion;Reduced laryngeal elevation;Reduced airway/laryngeal closure;Reduced tongue base retraction;Pharyngeal residue - valleculae   Pharyngeal --  Pharyngeal- Mechanical Soft Pharyngeal residue - valleculae;Pharyngeal residue - pyriform;Reduced pharyngeal peristalsis;Reduced tongue base retraction;Reduced laryngeal elevation;Reduced airway/laryngeal closure;Reduced epiglottic inversion  Pharyngeal --  Pharyngeal- Regular --  Pharyngeal --  Pharyngeal- Multi-consistency --  Pharyngeal --  Pharyngeal- Pill --  Pharyngeal --  Pharyngeal Comment head turn right did not decrease residuals, chin tuck worsened airway protection as it spilled barium into larynx      CHL IP CERVICAL ESOPHAGEAL PHASE 03/17/2018  Cervical Esophageal Phase WFL  Pudding Teaspoon --  Pudding Cup --  Honey Teaspoon --  Honey Cup --  Nectar Teaspoon --  Nectar Cup --  Nectar Straw --  Thin Teaspoon --  Thin Cup --  Thin Straw --  Puree --  Mechanical Soft --  Regular --  Multi-consistency --  Pill --  Cervical Esophageal Comment upon esophageal sweep, pt appeared with mild backflow of nectar, ? dysmotility     Chales Abrahams 03/17/2018, 1:30 PM   Donavan Burnet, MS [facility] SLP Acute Rehab Services Pager [phone] Office [phone]

## 2018-03-17 NOTE — Patient Outreach (Signed)
[facility]) Care Management  03/17/2018  Jeremy Gill 1946/05/06 [id number]    2nd attempt to outreach the patient for initial assessment.  Wife answered the phone ans stated that the patient was in the hospital.  Plan:  Rn Health Coach will close the case at this time due to patient admission to the hospital.   Juanell Fairly RN, BSN, [facility] RN Health Coach Disease Management [facility] Direct Dial:  [phone]  Fax: [phone]

## 2018-03-17 NOTE — Progress Notes (Signed)
CSW following to assist with discharge planning to SNF. CSW initiated patient's insurance authorization ([employer]) for SNF. CSW will continue to follow and assist with discharge planning.  Celso Sickle, [place] Clinical Social Worker [facility] Cell#: [phone]

## 2018-03-17 NOTE — Progress Notes (Signed)
Will plan for MBS today to assure pt on least restricted diet.     Donavan Burnet, MS [facility] SLP Acute Rehab Services Pager [phone] Office [phone]

## 2018-03-17 NOTE — Progress Notes (Signed)
OT Cancellation Note  Patient Details Name: DENIZ HANNAN MRN: [id number] DOB: 06-09-1945   Cancelled Treatment:     Pts wife requested OT not wake at this time. Wife requested OT return next day Lise Auer, [place] Acute Rehabilitation Services Pager- [phone] Office- [phone]     Carolyn Maniscalco, [place] 03/17/2018, 2:52 PM

## 2018-03-17 NOTE — Progress Notes (Signed)
CRITICAL VALUE ALERT  Critical Value:  Potassium 2.6  Date & Time Notied:  03/17/2018 1743  Provider Notified: Dr [name]  Orders Received/Actions taken: orders received

## 2018-03-17 NOTE — Plan of Care (Signed)

## 2018-03-18 ENCOUNTER — Telehealth: Payer: Self-pay | Admitting: Interventional Cardiology

## 2018-03-18 ENCOUNTER — Encounter: Payer: Self-pay | Admitting: *Deleted

## 2018-03-18 DIAGNOSIS — F0150 Vascular dementia without behavioral disturbance: Secondary | ICD-10-CM | POA: Diagnosis not present

## 2018-03-18 DIAGNOSIS — R279 Unspecified lack of coordination: Secondary | ICD-10-CM | POA: Diagnosis not present

## 2018-03-18 DIAGNOSIS — M6281 Muscle weakness (generalized): Secondary | ICD-10-CM | POA: Diagnosis not present

## 2018-03-18 DIAGNOSIS — E876 Hypokalemia: Secondary | ICD-10-CM | POA: Diagnosis not present

## 2018-03-18 DIAGNOSIS — E43 Unspecified severe protein-calorie malnutrition: Secondary | ICD-10-CM | POA: Diagnosis not present

## 2018-03-18 DIAGNOSIS — E440 Moderate protein-calorie malnutrition: Secondary | ICD-10-CM

## 2018-03-18 DIAGNOSIS — R9431 Abnormal electrocardiogram [ECG] [EKG]: Secondary | ICD-10-CM | POA: Diagnosis not present

## 2018-03-18 DIAGNOSIS — A419 Sepsis, unspecified organism: Secondary | ICD-10-CM | POA: Diagnosis not present

## 2018-03-18 DIAGNOSIS — E538 Deficiency of other specified B group vitamins: Secondary | ICD-10-CM | POA: Diagnosis not present

## 2018-03-18 DIAGNOSIS — R338 Other retention of urine: Secondary | ICD-10-CM

## 2018-03-18 DIAGNOSIS — E785 Hyperlipidemia, unspecified: Secondary | ICD-10-CM

## 2018-03-18 DIAGNOSIS — J181 Lobar pneumonia, unspecified organism: Secondary | ICD-10-CM | POA: Diagnosis not present

## 2018-03-18 DIAGNOSIS — R079 Chest pain, unspecified: Secondary | ICD-10-CM | POA: Diagnosis not present

## 2018-03-18 DIAGNOSIS — J189 Pneumonia, unspecified organism: Secondary | ICD-10-CM | POA: Diagnosis not present

## 2018-03-18 DIAGNOSIS — R1312 Dysphagia, oropharyngeal phase: Secondary | ICD-10-CM | POA: Diagnosis not present

## 2018-03-18 DIAGNOSIS — L89613 Pressure ulcer of right heel, stage 3: Secondary | ICD-10-CM | POA: Diagnosis not present

## 2018-03-18 DIAGNOSIS — I10 Essential (primary) hypertension: Secondary | ICD-10-CM | POA: Diagnosis not present

## 2018-03-18 DIAGNOSIS — I69828 Other speech and language deficits following other cerebrovascular disease: Secondary | ICD-10-CM | POA: Diagnosis not present

## 2018-03-18 DIAGNOSIS — E875 Hyperkalemia: Secondary | ICD-10-CM | POA: Diagnosis not present

## 2018-03-18 DIAGNOSIS — E1169 Type 2 diabetes mellitus with other specified complication: Secondary | ICD-10-CM

## 2018-03-18 DIAGNOSIS — G9340 Encephalopathy, unspecified: Secondary | ICD-10-CM | POA: Diagnosis not present

## 2018-03-18 DIAGNOSIS — R1310 Dysphagia, unspecified: Secondary | ICD-10-CM | POA: Diagnosis not present

## 2018-03-18 DIAGNOSIS — I214 Non-ST elevation (NSTEMI) myocardial infarction: Secondary | ICD-10-CM | POA: Diagnosis not present

## 2018-03-18 DIAGNOSIS — E1149 Type 2 diabetes mellitus with other diabetic neurological complication: Secondary | ICD-10-CM | POA: Diagnosis not present

## 2018-03-18 DIAGNOSIS — I129 Hypertensive chronic kidney disease with stage 1 through stage 4 chronic kidney disease, or unspecified chronic kidney disease: Secondary | ICD-10-CM | POA: Diagnosis not present

## 2018-03-18 DIAGNOSIS — E7849 Other hyperlipidemia: Secondary | ICD-10-CM | POA: Diagnosis not present

## 2018-03-18 DIAGNOSIS — R2681 Unsteadiness on feet: Secondary | ICD-10-CM | POA: Diagnosis not present

## 2018-03-18 DIAGNOSIS — Z743 Need for continuous supervision: Secondary | ICD-10-CM | POA: Diagnosis not present

## 2018-03-18 DIAGNOSIS — R531 Weakness: Secondary | ICD-10-CM | POA: Diagnosis not present

## 2018-03-18 DIAGNOSIS — L299 Pruritus, unspecified: Secondary | ICD-10-CM | POA: Diagnosis not present

## 2018-03-18 DIAGNOSIS — N183 Chronic kidney disease, stage 3 (moderate): Secondary | ICD-10-CM | POA: Diagnosis not present

## 2018-03-18 DIAGNOSIS — D529 Folate deficiency anemia, unspecified: Secondary | ICD-10-CM | POA: Diagnosis not present

## 2018-03-18 DIAGNOSIS — J449 Chronic obstructive pulmonary disease, unspecified: Secondary | ICD-10-CM | POA: Diagnosis not present

## 2018-03-18 DIAGNOSIS — R7989 Other specified abnormal findings of blood chemistry: Secondary | ICD-10-CM | POA: Diagnosis not present

## 2018-03-18 DIAGNOSIS — G9341 Metabolic encephalopathy: Secondary | ICD-10-CM | POA: Diagnosis not present

## 2018-03-18 DIAGNOSIS — L89619 Pressure ulcer of right heel, unspecified stage: Secondary | ICD-10-CM | POA: Diagnosis not present

## 2018-03-18 DIAGNOSIS — E1142 Type 2 diabetes mellitus with diabetic polyneuropathy: Secondary | ICD-10-CM | POA: Diagnosis not present

## 2018-03-18 DIAGNOSIS — F419 Anxiety disorder, unspecified: Secondary | ICD-10-CM | POA: Diagnosis not present

## 2018-03-18 DIAGNOSIS — D649 Anemia, unspecified: Secondary | ICD-10-CM | POA: Diagnosis not present

## 2018-03-18 DIAGNOSIS — Z4800 Encounter for change or removal of nonsurgical wound dressing: Secondary | ICD-10-CM | POA: Diagnosis not present

## 2018-03-18 DIAGNOSIS — L89153 Pressure ulcer of sacral region, stage 3: Secondary | ICD-10-CM | POA: Diagnosis not present

## 2018-03-18 DIAGNOSIS — N179 Acute kidney failure, unspecified: Secondary | ICD-10-CM | POA: Diagnosis not present

## 2018-03-18 DIAGNOSIS — Z7401 Bed confinement status: Secondary | ICD-10-CM | POA: Diagnosis not present

## 2018-03-18 LAB — CBC
HCT: 29.8 % — ABNORMAL LOW (ref 39.0–52.0)
Hemoglobin: 9.7 g/dL — ABNORMAL LOW (ref 13.0–17.0)
MCH: 29.8 pg (ref 26.0–34.0)
MCHC: 32.6 g/dL (ref 30.0–36.0)
MCV: 91.4 fL (ref 80.0–100.0)
PLATELETS: 271 10*3/uL (ref 150–400)
RBC: 3.26 MIL/uL — ABNORMAL LOW (ref 4.22–5.81)
RDW: 14 % (ref 11.5–15.5)
WBC: 10.2 10*3/uL (ref 4.0–10.5)
nRBC: 0 % (ref 0.0–0.2)

## 2018-03-18 LAB — GLUCOSE, CAPILLARY
GLUCOSE-CAPILLARY: 309 mg/dL — AB (ref 70–99)
GLUCOSE-CAPILLARY: 297 mg/dL — AB (ref 70–99)

## 2018-03-18 LAB — CULTURE, BLOOD (ROUTINE X 2)
Culture: NO GROWTH
Culture: NO GROWTH
Special Requests: ADEQUATE
Special Requests: ADEQUATE

## 2018-03-18 LAB — BASIC METABOLIC PANEL
Anion gap: 6 (ref 5–15)
BUN: 26 mg/dL — AB (ref 8–23)
CALCIUM: 7.9 mg/dL — AB (ref 8.9–10.3)
CHLORIDE: 107 mmol/L (ref 98–111)
CO2: 27 mmol/L (ref 22–32)
CREATININE: 0.99 mg/dL (ref 0.61–1.24)
GFR calc Af Amer: >60 mL/min (ref >60)
Glucose, Bld: 329 mg/dL — ABNORMAL HIGH (ref 70–99)
Potassium: 3.3 mmol/L — ABNORMAL LOW (ref 3.5–5.1)
Sodium: 140 mmol/L (ref 135–145)

## 2018-03-18 LAB — MAGNESIUM: MAGNESIUM: 1.9 mg/dL (ref 1.7–2.4)

## 2018-03-18 MED ORDER — BUDESONIDE-FORMOTEROL FUMARATE 160-4.5 MCG/ACT IN AERO: 2.0000 | INHALATION_SPRAY | Freq: Two times a day (BID) | RESPIRATORY_TRACT | 0 refills | Status: DC

## 2018-03-18 MED ORDER — TAMSULOSIN HCL 0.4 MG PO CAPS: 0.4000 mg | ORAL_CAPSULE | Freq: Every day | ORAL | 0 refills | Status: DC

## 2018-03-18 MED ORDER — INSULIN ASPART 100 UNIT/ML ~~LOC~~ SOLN
6.0000 [IU] | Freq: Three times a day (TID) | SUBCUTANEOUS | Status: DC
  Administered 2018-03-18: 6 [IU] via SUBCUTANEOUS

## 2018-03-18 MED ORDER — GUAIFENESIN ER 600 MG PO TB12: 1200.0000 mg | ORAL_TABLET | Freq: Two times a day (BID) | ORAL | 0 refills | Status: AC

## 2018-03-18 MED ORDER — CEFDINIR 300 MG PO CAPS: 300.0000 mg | ORAL_CAPSULE | Freq: Two times a day (BID) | ORAL | 0 refills | Status: AC

## 2018-03-18 MED ORDER — ADULT MULTIVITAMIN W/MINERALS CH: 1.0000 | ORAL_TABLET | Freq: Every day | ORAL | Status: DC

## 2018-03-18 MED ORDER — LEVALBUTEROL HCL 0.63 MG/3ML IN NEBU
0.6300 mg | INHALATION_SOLUTION | Freq: Two times a day (BID) | RESPIRATORY_TRACT | Status: DC
  Administered 2018-03-18: 0.63 mg via RESPIRATORY_TRACT
  Filled 2018-03-18: qty 3

## 2018-03-18 MED ORDER — IPRATROPIUM BROMIDE 0.02 % IN SOLN
0.5000 mg | Freq: Two times a day (BID) | RESPIRATORY_TRACT | Status: DC
  Administered 2018-03-18: 0.5 mg via RESPIRATORY_TRACT
  Filled 2018-03-18: qty 2.5

## 2018-03-18 MED ORDER — POTASSIUM CHLORIDE CRYS ER 20 MEQ PO TBCR
40.0000 meq | EXTENDED_RELEASE_TABLET | Freq: Once | ORAL | Status: AC
  Administered 2018-03-18: 40 meq via ORAL
  Filled 2018-03-18: qty 2

## 2018-03-18 MED ORDER — RESOURCE THICKENUP CLEAR PO POWD: 1.0000 | ORAL | Status: DC | PRN

## 2018-03-18 MED ORDER — LORATADINE 10 MG PO TABS: 10.0000 mg | ORAL_TABLET | Freq: Every day | ORAL | Status: DC

## 2018-03-18 MED ORDER — TRAMADOL HCL 50 MG PO TABS: 50.0000 mg | ORAL_TABLET | Freq: Four times a day (QID) | ORAL | 0 refills | Status: DC | PRN

## 2018-03-18 MED ORDER — INSULIN GLARGINE 100 UNIT/ML SOLOSTAR PEN: 28.0000 [IU] | PEN_INJECTOR | Freq: Every day | SUBCUTANEOUS | 0 refills | Status: DC

## 2018-03-18 MED ORDER — ENOXAPARIN SODIUM 40 MG/0.4ML ~~LOC~~ SOLN: 40.0000 mg | SUBCUTANEOUS | Status: DC

## 2018-03-18 MED ORDER — INSULIN ASPART 100 UNIT/ML ~~LOC~~ SOLN: 6.0000 [IU] | Freq: Three times a day (TID) | SUBCUTANEOUS | 0 refills | Status: AC

## 2018-03-18 MED ORDER — ISOSORBIDE MONONITRATE ER 30 MG PO TB24: 30.0000 mg | ORAL_TABLET | Freq: Every day | ORAL | 0 refills | Status: DC

## 2018-03-18 MED ORDER — INSULIN GLARGINE 100 UNIT/ML ~~LOC~~ SOLN
28.0000 [IU] | Freq: Every day | SUBCUTANEOUS | Status: DC
  Filled 2018-03-18: qty 0.28

## 2018-03-18 MED ORDER — FLUCONAZOLE 100 MG PO TABS: 100.0000 mg | ORAL_TABLET | Freq: Every day | ORAL | 0 refills | Status: AC

## 2018-03-18 MED ORDER — METOPROLOL TARTRATE 50 MG PO TABS: 50.0000 mg | ORAL_TABLET | Freq: Two times a day (BID) | ORAL | 0 refills | Status: DC

## 2018-03-18 MED ORDER — INSULIN ASPART 100 UNIT/ML ~~LOC~~ SOLN: 4.0000 [IU] | Freq: Three times a day (TID) | SUBCUTANEOUS | Status: DC

## 2018-03-18 MED ORDER — POTASSIUM CHLORIDE ER 20 MEQ PO TBCR: 20.0000 meq | EXTENDED_RELEASE_TABLET | Freq: Every day | ORAL | Status: DC

## 2018-03-18 MED ORDER — NYSTATIN 100000 UNIT/ML MT SUSP: 5.0000 mL | Freq: Four times a day (QID) | OROMUCOSAL | 0 refills | Status: AC

## 2018-03-18 MED ORDER — FOLIC ACID 1 MG PO TABS: 1.0000 mg | ORAL_TABLET | Freq: Every day | ORAL | Status: DC

## 2018-03-18 NOTE — Progress Notes (Signed)
  Speech Language Pathology Treatment: Dysphagia  Patient Details Name: Jeremy Gill MRN: [id number] DOB: 1946/04/07 Today's Date: 03/18/2018 Time: 4098-1191 SLP Time Calculation (min) (ACUTE ONLY): 48 min  Assessment / Plan / Recommendation Clinical Impression  72 yo male adm to [facility] with HCAP.  PMH + for HTN, DM, chronic back pain - progressive encephalopathy over the last few weeks - wife ?s mild dementia per MD note.  Pt found to have increased troponins also and cardiology consult conducted.  Pt CT head negative.  CXR showed LLL posterior density = ? septic emboli vs multifocal pna. Swallow evaluation ordered.  Pt has MBS yesterday revealing moderately severe dysphagia with sensorimotor deficits.  Pt seen today to assess tolerance of po, reinforce effective strategies and initiate RMST to improve submental muscle strength.   SLP reviewed MBS results with pt and his wife and initiated RMST - setting level clinically for expiratory strengthening with Respironic device.  Set at level 12 with pt able to perform 15 repetitions at effort level 7.  Pt benefited from moderate cues waning to minimal for adequate strength and pacing.  Wife present and was educated as well.  Demonstrated to wife how to thicken liquids - providing pt with thickened Sprite - used teach back.  Pt observed with intake of juice and sandwich - no indication of airway compromise.  Reinforced need to strengthen his "hock" to clear valleculae of residuals he does not sense.  Provided written information regarding [facility] Bonito protocol to wife.  Recommend continue diet and SLP follow up at SNF.  Thanks for allowing me to help care for this pt.    HPI HPI: 72 yo male adm to [facility] with HCAP.  PMH + for HTN, DM, chronic back pain - progressive encephalopathy over the last few weeks - wife ?s mild dementia per MD note.  Pt found to have increased troponins also and cardiology consult conducted.  Pt CT head negative.  CXR showed LLL posterior  density = ? septic emboli vs multifocal pna.  ? oral thrush pt being treated= also as h/o hypertrophic tongue pallilae.  Swallow evaluation ordered.  Pt has MBS yesterday revealing moderately severe dysphagia with sensorimotor deficits.  Pt seen today to assess tolerance of po, reinforce effective strategies and initiate RMST to improve submental muscle strength.        SLP Plan  Continue with current plan of care       Recommendations  Diet recommendations: Dysphagia 3 (mechanical soft);Nectar-thick liquid;Other(comment)(frazier water protocol) Liquids provided via: Cup;No straw;Teaspoon Medication Administration: Whole meds with puree Compensations: Minimize environmental distractions;Slow rate;Small sips/bites;Follow solids with liquid(intermittent "hock" to clear pharynx of residuals pt does not sense) Postural Changes and/or Swallow Maneuvers: Seated upright 90 degrees;Upright 30-60 min after meal                Follow up Recommendations: Skilled Nursing facility SLP Visit Diagnosis: Dysphagia, oropharyngeal phase (R13.12) Plan: Continue with current plan of care       GO                Chales Abrahams 03/18/2018, 1:49 PM  Donavan Burnet, MS [facility] SLP Acute Rehab Services Pager [phone] Office [phone]

## 2018-03-18 NOTE — Progress Notes (Signed)
Report called to [facility], [employer] RN

## 2018-03-18 NOTE — Progress Notes (Signed)
Patient with no void since foley catheter was removed yesterday evening. Patient attempted to void with RN's assistance without success. Patient bladder scan showed greater than . NP on call was notified and new order received to place foely cath for urinary retention. Will continue to monitor patient.

## 2018-03-18 NOTE — Discharge Summary (Signed)
Physician Discharge Summary  Jeremy Gill GNF:[id number] DOB: Jun 13, 1945 DOA: 03/13/2018  PCP: [place], [name], MD  Admit date: 03/13/2018 Discharge date: 03/18/2018  Time spent: 65 minutes  Recommendations for Outpatient Follow-up:  1. Patient will be discharged to skilled nursing facility.  Follow-up with MD at skilled nursing facility.  Patient will need a basic metabolic profile and a CBC done in 1 week to follow-up on electrolytes, renal function, H&H.  Patient will also need a voiding trial done in about 3 to 5 days and if patient fails voiding trial will need referral to outpatient urology for further evaluation and management. 2. Follow-up with Dr. Eldridge Dace, cardiology in 2 weeks.   Discharge Diagnoses:  Principal Problem:   Sepsis (HCC) Active Problems:   HCAP (healthcare-associated pneumonia)   Diabetes mellitus type 2 with neurological manifestations (HCC)   Diabetic polyneuropathy (HCC)   Essential hypertension   COPD (chronic obstructive pulmonary disease) (HCC)   Anxiety disorder, unspecified   Acute metabolic encephalopathy   Hyperlipidemia associated with type 2 diabetes mellitus (HCC)   Acute renal failure superimposed on stage 3 chronic kidney disease (HCC)   Anemia   Pressure injury of skin   Acute urinary retention   Protein-calorie malnutrition, severe   Elevated troponin   Abnormal EKG   Chest pain   Oral candidiasis   Dysphagia   Malnutrition of moderate degree   Folate deficiency   Discharge Condition: Stable and improved  Diet recommendation: Heart healthy/dysphagia 3 diet with nectar thick liquids.  May have ice chips.  Filed Weights   03/13/18 1247 03/13/18 1600  Weight: 68 kg 71.6 kg    History of present illness:  Per Dr. Kathryne Sharper Dr Dannette Barbara is a 72 y.o. male with medical history significant of DM with neuropathy, HTN, HLD, BPH who was brought to the hospital by family due to confusion.  Patient is alert however quite  confused, fidgety, and couldnot contribute much to the story.  Admitting MD discussed with the patient's wife who was at bedside.  She stated that over the past couple weeks, he has been slightly more confused than his baseline.  She suspected he may have a degree of underlying dementia.  He was hospitalized in August of this year with acute encephalopathy, and improved however still at times even at home when he was at baseline he appearred somewhat confused.  She also reported that over the last 2 weeks he had been having a reversal of sleep wake pattern, staying up all night and sleeping during the day.  She denied patient having a fever/chills/feeling hot.  There are no reported abdominal pain chest pain, shortness of breath, nausea vomiting or diarrhea.  He eats "fair" per wife.  He has been complaining of increased lower back pain, he has had this pain for years however over the last month he stated that his home tramadol was no longer working.  He continues to smoke a pipe and has been resistant to quitting.  Wife reported that he has a chronic cough but did not feel like it had gotten worse.  ED Course: in the ED patient is found to be septic, tachycardic, tachypneic, with increased WBC. He is confused.Labs show renal failure with Cr 1.77 from prior normal values.Troponin is 0.8, LA 3.9. CXR with concern with multifocal pneumonia. He was given Vanc/Cefepime and we were asked to admit  Hospital Course:  1 sepsis secondary to multifocal pneumonia/healthcare associated pneumonia Patient was admitted with sepsis Patient  admitted with sepsis with altered mental status, chest x-ray consistent with multifocal pneumonia, patient noted to be tachycardic on admission, with a leukocytosis, elevated lactic acid level.  Urinalysis nitrite negative, leukocytes negative.  Blood cultures obtained with no growth to date. MRSA PCR negative.  Urine Legionella antigen negative.  Urine pneumococcus antigen negative.   Patient initially placed empirically on IV vancomycin and IV cefepime.  As patient improved clinically antibiotics were narrowed down to oral Vantin.  Patient will be discharged on 3 more days of oral Vantin to complete a one-week course of antibiotic treatment.  Patient was also maintained on scheduled nebs, Claritin and Mucinex.  2.  Elevated troponin/chest pain/EKG changes/NSTEMI On admission patient was noted to have a point-of-care troponin at 0.83.  Cardiac enzymes were cycled with troponins rising to as high as 1.64.  Patient did have some chest pain early on this morning which has since resolved.  EKG done showed some ST depressions in leads V3 through V6.  Elevated troponin EKG changes may be secondary to demand ischemia however patient with history of COPD, diabetes, hyperlipidemia are concerning for possible acute coronary syndrome.  2D echo with a EF of 60 to 65%, grade 1 diastolic dysfunction.  Serial cardiac enzymes slowly trending down.  Fasting lipid panel with a LDL of 28.  Patient currently on a heparin drip which was discontinued per cardiology today 03/16/2018.  Patient has been seen in consultation by cardiology who feel that given patient's acute illness, anemia, acute kidney injury, confusion, probable underlying dementia and overall poor shape we will treat medically at this time.  Per cardiology after stabilization would consider cardiac catheterization at a later date in the outpatient setting.  Patient maintained on aspirin, beta-blocker, imdur, statin.    Outpatient follow-up with cardiology.    3.  Acute metabolic encephalopathy Likely secondary to problem #1.  Improved.  Patient alert to self, place, knows it is 2019.  Thinks it is August.  Does not know who the president is.  Knows he was at [facility].  Patient has been pancultured.  Patient treated empirically with IV antibiotics for his pneumonia.  Patient improved clinically and likely was at baseline by day of  discharge.  4.  Oral candidiasis Patient patient given an initial dose of IV fluconazole and subsequently transition to oral Diflucan and nystatin swish and swallow with clinical improvement.  Patient be discharged on 4 more days of oral Diflucan and nystatin swish and swallow to complete a 7-day course of treatment.    5.  Dysphagia Patient with some complaints of dysphagia.  Per wife patient wincing when he tries to swallow.  Patient however denied any food getting stuck.  Patient denied any significant odynophagia.  Patient however was noted to have oral thrush.    Patient placed on fluconazole. Patient was seen by speech therapist and patient started on a dysphagia 3 diet with nectar thick liquids.  Outpatient follow-up.  6.  COPD Stable.    Patient maintained on Dulera, Claritin and scheduled nebulizer treatments.   7.  Acute kidney injury In the setting of sepsis and poor oral intake.  Improved with hydration.    8.  Type 2 diabetes mellitus with diabetic neuropathy Hemoglobin A1c was 9.1 on January 14, 2018.    Patient was placed on Lantus and dose adjusted to 28 units daily.  Patient was subsequently placed on meal coverage NovoLog 6 units 3 times daily with meals as well as sliding scale insulin.  Patient be discharged on Lantus 28 units daily as well as meal coverage NovoLog 6 units with meals as well as his home regimen of metformin.  Outpatient follow-up.   9.  Hypertension Due to swallowing issues patient was initially placed on IV Lopressor on 03/14/2018.  Patient was subsequently started on a dysphagia 3 diet and IV Lopressor changed to oral beta-blocker.  Imdur was added to patient's regimen per cardiology.  Blood pressure improved.  Outpatient follow-up with cardiology.  Follow.   10.  Presumed dementia Per admitting physician per wife's report with unclear baseline.  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  Will likely need outpatient follow-up.  11.  Severe protein calorie malnutrition Patient with poor oral input.  Patient with complaints of some dysphagia with noted oral candidiasis.  Patient also cachectic on admission.  Albumin level of 2.3.  Patient seen by speech therapy and patient started on a dysphagia 3 diet.  Patient was also maintained on Ensure as well as Diflucan for oral candidiasis.  Outpatient follow-up.   12.  Multiple pressure injuries to right elbow, right medial heel, sacrum with deep tissue pressure injury Patient seen by wound care nurse.  Wound care recommendations made which was continued during the hospitalization.    13.  Anemia/folate deficiency Patient with no overt bleeding.  Likely dilutional component as well.  Anemia panel consistent with folate deficiency.  Hemoglobin at 7.4 on 03/16/2018 from 10.5 on admission.  FOBT negative.  Likely dilutional effect.  Saline lock IV fluids.  Status post 2 units packed red blood cells, with hemoglobin stabilizing at 9.7 by day of discharge.  Outpatient follow-up.  14.  Hypokalemia/hypomagnesemia Repleted.    Patient will be discharged on K-Dur 20 milliequivalents daily.  Patient will need repeat labs done in about a week for follow-up on his electrolytes and renal function.  15.  Urinary retention On admission Foley catheter was initially placed.  Patient Flomax was continued during the hospitalization.  Voiding trial was done however patient failed voiding trial and as such Foley catheter was placed back in.  Patient will be discharged with Foley catheter and Flomax and will need a voiding trial at the skilled nursing facility in about 3 to 5 days.  If patient fails that voiding trial patient may benefit from outpatient referral to urology.  Procedures:  CT head 03/13/2018  Chest x-ray  03/13/2018  2D echo 03/14/2018  2 units packed red blood cells 03/16/2018  Modified barium swallow 03/17/2018  Consultations:  Cardiology: Dr. Jens Som 03/14/2018  Discharge Exam: Vitals:   03/18/18 0427 03/18/18 0822  BP: 132/83   Pulse: 87   Resp: 18   Temp: 97.8 F (36.6 C)   SpO2: 92% 94%    General: NAD Cardiovascular: RRR Respiratory: CTAB  Discharge Instructions   Discharge Instructions    Diet - low sodium heart healthy   Complete by:  As directed    Dysphagia 3 diet with nectar thick liquids. May have ice chips   Increase activity slowly   Complete by:  As directed      Allergies as of 03/18/2018   No Known Allergies     Medication List    STOP taking these medications   diclofenac 50 MG EC tablet Commonly known as:  VOLTAREN   metoprolol succinate 25 MG  24 hr tablet Commonly known as:  TOPROL-XL     TAKE these medications   ACCU-CHEK SOFTCLIX LANCETS lancets Use as instructed   aspirin 81 MG chewable tablet Chew 81 mg by mouth 2 (two) times a week. Pt takes on Monday and Friday.   atorvastatin 40 MG tablet Commonly known as:  LIPITOR Take 1 tablet (40 mg total) by mouth daily.   baclofen 10 MG tablet Commonly known as:  LIORESAL TAKE 1 TABLET BY MOUTH 3 TIMES DAILY   budesonide-formoterol 160-4.5 MCG/ACT inhaler Commonly known as:  SYMBICORT Inhale 2 puffs into the lungs 2 (two) times daily.   cefdinir 300 MG capsule Commonly known as:  OMNICEF Take 1 capsule (300 mg total) by mouth every 12 (twelve) hours for 3 days.   collagenase ointment Commonly known as:  SANTYL Apply 1 application topically daily. What changed:  additional instructions   DULoxetine 60 MG capsule Commonly known as:  CYMBALTA TAKE 1 CAPSULE BY MOUTH EVERY DAY   enoxaparin 40 MG/0.4ML injection Commonly known as:  LOVENOX Inject 0.4 mLs (40 mg total) into the skin daily.   feeding supplement (ENSURE ENLIVE) Liqd Take 237 mLs by mouth 3 (three) times  daily between meals.   fluconazole 100 MG tablet Commonly known as:  DIFLUCAN Take 1 tablet (100 mg total) by mouth daily for 4 days. Start taking on:  03/19/2018   folic acid 1 MG tablet Commonly known as:  FOLVITE Take 1 tablet (1 mg total) by mouth daily. Start taking on:  03/19/2018   gabapentin 600 MG tablet Commonly known as:  NEURONTIN Take 1 tablet (600 mg total) by mouth 3 (three) times daily.   guaiFENesin 600 MG 12 hr tablet Commonly known as:  MUCINEX Take 2 tablets (1,200 mg total) by mouth 2 (two) times daily for 3 days.   insulin aspart 100 UNIT/ML injection Commonly known as:  novoLOG Inject 6 Units into the skin 3 (three) times daily with meals.   Insulin Glargine 100 UNIT/ML Solostar Pen Commonly known as:  LANTUS Inject 28 Units into the skin at bedtime. What changed:    how much to take  how to take this  when to take this  additional instructions   INSULIN SYRINGE .5CC/31GX5/16" 31G X 5/16" 0.5 ML Misc USE AS DIRECTED WITH LANTUS   ipratropium-albuterol 0.5-2.5 (3) MG/3ML Soln Commonly known as:  DUONEB Take 3 mLs by nebulization every 6 (six) hours as needed. What changed:  reasons to take this   isosorbide mononitrate 30 MG 24 hr tablet Commonly known as:  IMDUR Take 1 tablet (30 mg total) by mouth daily. Start taking on:  03/19/2018   loratadine 10 MG tablet Commonly known as:  CLARITIN Take 1 tablet (10 mg total) by mouth daily. Start taking on:  03/19/2018   metFORMIN 500 MG tablet Commonly known as:  GLUCOPHAGE Take 2 tablets (1,000 mg total) by mouth 2 (two) times daily with a meal.   metoprolol tartrate 50 MG tablet Commonly known as:  LOPRESSOR Take 1 tablet (50 mg total) by mouth 2 (two) times daily.   multivitamin with minerals Tabs tablet Take 1 tablet by mouth daily. Start taking on:  03/19/2018   nystatin 100000 UNIT/ML suspension Commonly known as:  MYCOSTATIN Take 5 mLs (500,000 Units total) by mouth 4 (four)  times daily for 4 days.   ONETOUCH VERIO test strip Generic drug:  glucose blood Use to test blood sugar three-four times daily.   polyethylene glycol packet  Commonly known as:  MIRALAX / GLYCOLAX Take 17 g by mouth daily.   Potassium Chloride ER 20 MEQ Tbcr Take 20 mEq by mouth daily.   RESOURCE THICKENUP CLEAR Powd Take 120 g by mouth as needed.   tamsulosin 0.4 MG Caps capsule Commonly known as:  FLOMAX Take 1 capsule (0.4 mg total) by mouth daily after breakfast. NEED OV. What changed:    when to take this  reasons to take this   traMADol 50 MG tablet Commonly known as:  ULTRAM Take 1 tablet (50 mg total) by mouth every 6 (six) hours as needed. for pain   triamcinolone ointment 0.1 % Commonly known as:  KENALOG Apply 1 application topically 2 (two) times daily as needed (rash). Compounded as a 1:1 mixture with Eucerin.      No Known Allergies Follow-up Information    md at SNF Follow up.        Corky Crafts, MD. Schedule an appointment as soon as possible for a visit in 2 week(s).   Specialties:  Cardiology, Radiology, Interventional Cardiology Contact information: [address] [place] [address] [phone]            The results of significant diagnostics from this hospitalization (including imaging, microbiology, ancillary and laboratory) are listed below for reference.    Significant Diagnostic Studies: Dg Chest 2 View  Result Date: 03/13/2018 CLINICAL DATA:  Possible sepsis EXAM: CHEST - 2 VIEW COMPARISON:  Chest x-ray of August 1929 FINDINGS: The lungs are mildly hyperinflated with hemidiaphragm flattening. There is a new dense infiltrate in the left lower lobe posteriorly. There are patchy airspace opacities elsewhere in the left lung and to a lesser extent in the right lung worrisome for septic emboli. The heart and pulmonary vascularity are normal. The mediastinum is normal in width. There is calcification in the  wall of the aortic arch. IMPRESSION: COPD. Left lower lobe pneumonia. Patchy airspace opacities elsewhere in the left lung and to a lesser extent on the right worrisome for multifocal pneumonia or septic emboli. Electronically Signed   By: David  [place] M.D.   On: 03/13/2018 13:53   Ct Head Wo Contrast  Result Date: 03/13/2018 CLINICAL DATA:  72 year old male with unexplained altered mental status. EXAM: CT HEAD WITHOUT CONTRAST TECHNIQUE: Contiguous axial images were obtained from the base of the skull through the vertex without intravenous contrast. COMPARISON:  Brain MRI 01/14/2018, head CT 01/13/2018 and earlier. FINDINGS: Brain: Chronic confluent cerebral white matter hypodensity, moderate to severe heterogeneity in the left deep gray matter nuclei, and pons. Stable gray-white matter differentiation throughout the brain. No cortical encephalomalacia identified. No midline shift, ventriculomegaly, mass effect, evidence of mass lesion, intracranial hemorrhage or evidence of cortically based acute infarction. Vascular: Calcified atherosclerosis at the skull base. No suspicious intracranial vascular hyperdensity. Skull: Negative. Sinuses/Orbits: Visualized paranasal sinuses and mastoids are stable and well pneumatized. Other: Stable orbit and scalp soft tissues. IMPRESSION: No acute intracranial abnormality. Stable non contrast CT appearance of the brain with advanced chronic small vessel disease. Electronically Signed   By: Odessa Fleming M.D.   On: 03/13/2018 14:14   Dg Swallowing Func-speech Pathology  Result Date: 03/17/2018 Objective Swallowing Evaluation: Type of Study: MBS-Modified Barium Swallow Study  Patient Details Name: ELVER STADLER MRN: [id number] Date of Birth: Sep 27, 1945 Today's Date: 03/17/2018 Time: SLP Start Time (ACUTE ONLY): 1145 -SLP Stop Time (ACUTE ONLY): 1214 SLP Time Calculation (min) (ACUTE ONLY): 29 min Past Medical History: Past Medical  History: Diagnosis Date . ANKLE SPRAIN  12/16/2006  Qualifier: Diagnosis of  By: Tawanna Cooler MD, Eugenio Hoes  . ANXIETY 10/25/2006 . CELLULITIS, LEG, RIGHT 09/08/2007  Qualifier: Diagnosis of  By: Tawanna Cooler MD, Eugenio Hoes  . Chronic pain disorder 11/30/2015 . COPD 10/25/2006 . Diabetes mellitus type 2 with neurological manifestations (HCC) 10/25/2006  Qualifier: Diagnosis of  By: Rosette Reveal RN, Jorene Minors  . DIABETES MELLITUS, TYPE I 10/25/2006 . Diabetic polyneuropathy (HCC) 04/08/2007  Qualifier: Diagnosis of  By: Tawanna Cooler MD, Eugenio Hoes  . 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 Essential hypertension 10/25/2006  Qualifier: Diagnosis of  By: Rosette Reveal RN, Jorene Minors  . Generalized osteoarthritis of multiple sites 11/30/2015 . HYPERTENSION 10/25/2006 . 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 Low back pain with radiation 01/23/2016 . Polyneuropathy in diabetes(357.2) 04/08/2007 . Psoriasis  . TOBACCO ABUSE 07/08/2007 . VENOUS INSUFFICIENCY 11/25/2007 Past Surgical History: Past Surgical History: Procedure Laterality Date . TESTICLE REMOVAL    R testicle HPI: 72 yo male adm to [facility] with HCAP.  PMH + for HTN, DM, chronic back pain - progressive encephalopathy over the last few weeks - wife ?s mild dementia per MD note.  Pt found to have increased troponins also and cardiology consult conducted.  Pt CT head negative.  CXR showed LLL posterior density = ? septic emboli vs multifocal pna.  ? oral thrush pt being treated= also as h/o hypertrophic tongue pallilae.  Swallow evaluation ordered.   Subjective: pt awake in chair Assessment / Plan / Recommendation CHL IP CLINICAL IMPRESSIONS 03/14/2018 Clinical Impression Pt presents with moderate oropharyngeal dysphagia with sensorimotor deficits.  Pt did have audible aspiration of thin and nectar with sequential swallows due to decreased adequacy of airway closure/protection.  Aspirates were too deep to allow clearance.  Pt also with residuals in pharynx that he does NOT sense nor clears with "hock" or liquid  swallows.  Dry swallows were not able to be performed by pt = ? due to motor planning and/or cognition.  Head turn right did not decrease pharyngeal residuals that were worse with increased viscocity.  Chin tuck worsened airway protection as it spilled barium into larynx from pyriform sinus.  Also cues of counting "1,2,3" did not help pt to initiate a swallow or orally transit more efficiently.   Suspect pt has some level of chronic dysphagia/aspiration.  Mitigation strategies reviewed with wife and pt using images from study during and immediately post-test.  Recommend follow up at SNF for RMST to help strengthen swallow musculature.   SLP Visit Diagnosis Dysphagia, oropharyngeal phase (R13.12) Attention and concentration deficit following -- Frontal lobe and executive function deficit following -- Impact on safety and function Moderate aspiration risk   CHL IP TREATMENT RECOMMENDATION 03/14/2018 Treatment Recommendations Therapy as outlined in treatment plan below   Prognosis 03/17/2018 Prognosis for Safe Diet Advancement Fair Barriers to Reach Goals Time post onset Barriers/Prognosis Comment -- CHL IP DIET RECOMMENDATION 03/14/2018 SLP Diet Recommendations Dysphagia 2 (Fine chop) solids;Nectar thick liquid;Other (Comment) Liquid Administration via Cup;No straw;Spoon Medication Administration Whole meds with puree Compensations Minimize environmental distractions;Slow rate;Small sips/bites;Other (Comment);Effortful swallow;Follow solids with liquid Postural Changes Remain semi-upright after after feeds/meals (Comment);Seated upright at 90 degrees   CHL IP OTHER RECOMMENDATIONS 03/14/2018 Recommended Consults -- Oral Care Recommendations Oral care BID Other Recommendations Order thickener from pharmacy   CHL IP FOLLOW UP RECOMMENDATIONS 03/14/2018 Follow up Recommendations Skilled Nursing facility   [facility] IP FREQUENCY AND DURATION 03/14/2018 Speech  Therapy Frequency (ACUTE ONLY) min 2x/week Treatment Duration 1 week       CHL IP ORAL PHASE 03/17/2018 Oral Phase Impaired Oral - Pudding Teaspoon -- Oral - Pudding Cup -- Oral - Honey Teaspoon -- Oral - Honey Cup -- Oral - Nectar Teaspoon Decreased bolus cohesion;Piecemeal swallowing;Weak lingual manipulation;Reduced posterior propulsion Oral - Nectar Cup Decreased bolus cohesion;Premature spillage;Piecemeal swallowing;Weak lingual manipulation;Lingual pumping Oral - Nectar Straw Decreased bolus cohesion;Weak lingual manipulation;Premature spillage;Piecemeal swallowing Oral - Thin Teaspoon Decreased bolus cohesion;Premature spillage;Lingual pumping;Weak lingual manipulation;Piecemeal swallowing Oral - Thin Cup Decreased bolus cohesion;Lingual pumping;Premature spillage;Weak lingual manipulation;Piecemeal swallowing Oral - Thin Straw Decreased bolus cohesion;Weak lingual manipulation;Lingual pumping;Premature spillage;Piecemeal swallowing Oral - Puree Decreased bolus cohesion;Weak lingual manipulation;Lingual pumping Oral - Mech Soft Decreased bolus cohesion;Impaired mastication;Weak lingual manipulation;Lingual pumping Oral - Regular -- Oral - Multi-Consistency -- Oral - Pill -- Oral Phase - Comment counting 1,2,3 did not help pt to initiate a swallow or orally transit more efficiently   CHL IP PHARYNGEAL PHASE 03/17/2018 Pharyngeal Phase Impaired Pharyngeal- Pudding Teaspoon -- Pharyngeal -- Pharyngeal- Pudding Cup -- Pharyngeal -- Pharyngeal- Honey Teaspoon -- Pharyngeal -- Pharyngeal- Honey Cup -- Pharyngeal -- Pharyngeal- Nectar Teaspoon Reduced pharyngeal peristalsis;Reduced epiglottic inversion;Reduced laryngeal elevation;Reduced airway/laryngeal closure;Pharyngeal residue - valleculae;Pharyngeal residue - pyriform Pharyngeal -- Pharyngeal- Nectar Cup Reduced laryngeal elevation;Reduced airway/laryngeal closure;Reduced pharyngeal peristalsis;Reduced tongue base retraction;Penetration/Aspiration during swallow;Pharyngeal residue - valleculae;Pharyngeal residue - pyriform  Pharyngeal Material enters airway, remains ABOVE vocal cords and not ejected out Pharyngeal- Nectar Straw Reduced epiglottic inversion;Reduced anterior laryngeal mobility;Reduced laryngeal elevation;Reduced airway/laryngeal closure;Reduced tongue base retraction;Pharyngeal residue - valleculae;Pharyngeal residue - pyriform;Penetration/Apiration after swallow Pharyngeal Material enters airway, passes BELOW cords and not ejected out despite cough attempt by patient Pharyngeal- Thin Teaspoon Reduced tongue base retraction;Reduced airway/laryngeal closure;Reduced pharyngeal peristalsis;Reduced laryngeal elevation;Pharyngeal residue - valleculae;Pharyngeal residue - pyriform Pharyngeal -- Pharyngeal- Thin Cup Reduced laryngeal elevation;Reduced pharyngeal peristalsis;Reduced tongue base retraction;Reduced epiglottic inversion;Penetration/Aspiration during swallow;Reduced airway/laryngeal closure;Pharyngeal residue - valleculae;Pharyngeal residue - pyriform Pharyngeal Material enters airway, CONTACTS cords and not ejected out Pharyngeal- Thin Straw -- Pharyngeal -- Pharyngeal- Puree Reduced pharyngeal peristalsis;Reduced epiglottic inversion;Reduced laryngeal elevation;Reduced airway/laryngeal closure;Reduced tongue base retraction;Pharyngeal residue - valleculae Pharyngeal -- Pharyngeal- Mechanical Soft Pharyngeal residue - valleculae;Pharyngeal residue - pyriform;Reduced pharyngeal peristalsis;Reduced tongue base retraction;Reduced laryngeal elevation;Reduced airway/laryngeal closure;Reduced epiglottic inversion Pharyngeal -- Pharyngeal- Regular -- Pharyngeal -- Pharyngeal- Multi-consistency -- Pharyngeal -- Pharyngeal- Pill -- Pharyngeal -- Pharyngeal Comment head turn right did not decrease residuals, chin tuck worsened airway protection as it spilled barium into larynx   CHL IP CERVICAL ESOPHAGEAL PHASE 03/17/2018 Cervical Esophageal Phase WFL Pudding Teaspoon -- Pudding Cup -- Honey Teaspoon -- Honey Cup -- Nectar  Teaspoon -- Nectar Cup -- Nectar Straw -- Thin Teaspoon -- Thin Cup -- Thin Straw -- Puree -- Mechanical Soft -- Regular -- Multi-consistency -- Pill -- Cervical Esophageal Comment upon esophageal sweep, pt appeared with mild backflow of nectar, ? dysmotility Donavan Burnet, MS [facility] SLP Acute Rehab Services Pager [phone] Office [phone] Chales Abrahams 03/17/2018, 1:27 PM               Microbiology: Recent Results (from the past 240 hour(s))  Culture, blood (Routine x 2)     Status: None   Collection Time: 03/13/18  1:14 PM  Result Value Ref Range Status   Specimen Description   Final    BLOOD RIGHT FOREARM Performed at [facility], [address], [place], [place] [address]    Special Requests   Final  BOTTLES DRAWN AEROBIC AND ANAEROBIC Blood Culture adequate volume Performed at [facility], [address] [place], [place] [address]    Culture   Final    NO GROWTH 5 DAYS Performed at [facility], [address] [place], [place] [address]    Report Status 03/18/2018 FINAL  Final  Culture, blood (Routine x 2)     Status: None   Collection Time: 03/13/18  1:17 PM  Result Value Ref Range Status   Specimen Description   Final    BLOOD LEFT ANTECUBITAL Performed at [facility], [address] [place], [place] [address]    Special Requests   Final    BOTTLES DRAWN AEROBIC AND ANAEROBIC Blood Culture adequate volume Performed at [facility], [address] [place], [place] [address]    Culture   Final    NO GROWTH 5 DAYS Performed at [facility], [address] [place], [place] [address]    Report Status 03/18/2018 FINAL  Final  Urine culture     Status: None   Collection Time: 03/13/18  1:17 PM  Result Value Ref Range Status   Specimen Description   Final    URINE, CLEAN CATCH Performed at [facility], [address] [place], [place] [address]     Special Requests   Final    NONE Performed at [facility], [address], [place], [place] [address]    Culture   Final    NO GROWTH Performed at [facility], [address], [place], [place] [address]    Report Status 03/15/2018 FINAL  Final  MRSA PCR Screening     Status: None   Collection Time: 03/13/18  6:41 PM  Result Value Ref Range Status   MRSA by PCR NEGATIVE NEGATIVE Final    Comment:        The GeneXpert MRSA Assay (FDA approved for NASAL specimens only), is one component of a comprehensive MRSA colonization surveillance program. It is not intended to diagnose MRSA infection nor to guide or monitor treatment for MRSA infections. Performed at [facility], [address] [place], [place] [address]      Labs: Basic Metabolic Panel: Recent Labs  Lab 03/14/18 0334 03/15/18 0734 03/16/18 0103 03/17/18 0450 03/17/18 1632 03/18/18 0619  NA 142 138 139 141  --  140  K 3.9 3.2* 3.4* 3.0* 2.6* 3.3*  CL 109 105 109 108  --  107  CO2 25 23 22 24   --  27  GLUCOSE 87 278* 443* 328*  --  329*  BUN 41* 28* 26* 26*  --  26*  CREATININE 1.27* 1.03 1.18 1.07  --  0.99  CALCIUM 8.2* 7.9* 7.9* 8.1*  --  7.9*  MG  --  1.3* 1.8 1.7  --  1.9   Liver Function Tests: Recent Labs  Lab 03/13/18 1317 03/14/18 0334  AST 25 19  ALT 16 14  ALKPHOS 43 36*  BILITOT 1.2 0.9  PROT 6.7 5.9*  ALBUMIN 2.8* 2.3*   No results for input(s): LIPASE, AMYLASE in the last 168 hours. No results for input(s): AMMONIA in the last 168 hours. CBC: Recent Labs  Lab 03/13/18 1317 03/14/18 0334 03/15/18 0734 03/16/18 0103 03/16/18 2120 03/17/18 0450 03/18/18 0619  WBC 12.8* 11.7* 10.8* 10.6*  --  12.6* 10.2  NEUTROABS 10.5*  --  8.1*  --   --   --   --  HGB 10.5* 9.4* 8.6* 7.4* 9.5* 9.7* 9.7*  HCT 32.6* 28.4* 26.6* 23.1* 28.7* 29.8* 29.8*  MCV 94.8 93.1 92.4 93.5  --  91.7 91.4  PLT 263 255 255 236  --  236 271   Cardiac  Enzymes: Recent Labs  Lab 03/13/18 1317  03/13/18 2128 03/14/18 0334 03/14/18 0928 03/14/18 1552 03/15/18 0827  CKTOTAL 94  --   --   --   --   --   --   TROPONINI  --    < > 1.64* 1.63* 1.23* 1.11* 0.81*   < > = values in this interval not displayed.   BNP: BNP (last 3 results) No results for input(s): BNP in the last 8760 hours.  ProBNP (last 3 results) No results for input(s): PROBNP in the last 8760 hours.  CBG: Recent Labs  Lab 03/17/18 1226 03/17/18 1607 03/17/18 2134 03/18/18 0732 03/18/18 1211  GLUCAP 239* 203* 328* 309* 297*       Signed:  Ramiro Harvest MD.  [facility] 03/18/2018, 2:29 PM

## 2018-03-18 NOTE — Clinical Social Work Placement (Signed)
Patient received and accepted bed offer at [facility]. Facility aware of patient's discharge and confirmed bed offer. PTAR contacted, patient's family notified. Patient's RN can call report to [phone], packet complete. CSW signing off, no other needs identified at this time.  CLINICAL SOCIAL WORK PLACEMENT  NOTE  Date:  03/18/2018  Patient Details  Name: Jeremy Gill MRN: [id number] Date of Birth: [birth year]  Clinical Social Work is seeking post-discharge placement for this patient at the Skilled  Nursing Facility level of care (*CSW will initial, date and re-position this form in  chart as items are completed):  Yes   Patient/family provided with [facility] Clinical Social Work Department's list of facilities offering this level of care within the geographic area requested by the patient (or if unable, by the patient's family).  Yes   Patient/family informed of their freedom to choose among providers that offer the needed level of care, that participate in Medicare, Medicaid or managed care program needed by the patient, have an available bed and are willing to accept the patient.  Yes   Patient/family informed of [place]'s ownership interest in [facility] and [facility], as well as of the fact that they are under no obligation to receive care at these facilities.  PASRR submitted to EDS on       PASRR number received on       Existing PASRR number confirmed on 03/16/18     FL2 transmitted to all facilities in geographic area requested by pt/family on 03/16/18     FL2 transmitted to all facilities within larger geographic area on       Patient informed that his/her managed care company has contracts with or will negotiate with certain facilities, including the following:        Yes   Patient/family informed of bed offers received.  Patient chooses bed at [facility]     Physician recommends and patient chooses bed at      Patient to be  transferred to [facility] on 03/18/18.  Patient to be transferred to facility by PTAR     Patient family notified on 03/18/18 of transfer.  Name of family member notified:  Landis Martins     PHYSICIAN       Additional Comment:    _______________________________________________ Antionette Poles, LCSW 03/18/2018, 2:43 PM

## 2018-03-18 NOTE — Progress Notes (Signed)
PT Cancellation Note  Patient Details Name: Jeremy Gill MRN: [id number] DOB: January 03, 1946   Cancelled Treatment:    Reason Eval/Treat Not Completed: Patient declined, no reason specified;Fatigue/lethargy limiting ability to participate - Pt declines acute PT today, stating he is going to SNF today and does not want to mobilize. Pt asked x2 to mobilize with PT, pt refusing both times. Will continue to follow acutely if pt does not d/c today.   Jeremy Gill, PT Acute Rehabilitation Services Pager [phone]  Office [phone]    Jeremy Gill 03/18/2018, 12:13 PM

## 2018-03-18 NOTE — Progress Notes (Signed)
OT Cancellation Note  Patient Details Name: Jeremy Gill MRN: [id number] DOB: 12/29/1945   Cancelled Treatment:    Reason Eval/Treat Not Completed: Other (comment)  OT checked in with pt and pt working with SLP - will check back later in day or next day as schedule allows.  Lise Auer, OT Acute Rehabilitation Services [phone] Office- [phone]     Dionicio Shelnutt, Karin Golden D 03/18/2018, 1:42 PM

## 2018-03-18 NOTE — Telephone Encounter (Signed)
° ° °  [facility] calling with questions regarding discharge medications. Please call Dr Sandria Manly [phone]

## 2018-03-18 NOTE — Consult Note (Signed)
   [facility] CM Inpatient Consult   03/18/2018  Jeremy Gill Mar 23, 1946 [id number]   Went to bedside to speak with Jeremy Gill and wife Jeremy Gill about [facility] Care Management program services. Jeremy Gill endorses that discharge plan is for Jeremy Gill. Both patient and wife are agreeable to [facility] Care Management services and written consent obtained. [facility] folder provided.   Notification sent to [facility] Post Acute Coordinator to make aware of writer's bedside visit and that [facility] written consent was obtained.   Jeremy Gill (wife) endorses that she should be contacted for post discharge calls at [phone]. [name] is agreeable to this. He states " my wife takes care of everything for me."  Will continue to follow along for disposition plans.   [name], MSN-Ed, RN,BSN [facility] Liaison [phone]

## 2018-03-18 NOTE — Telephone Encounter (Signed)
Returned call to Dr. Gordy Councilman at [facility]. She is wanting to know why the patient is only taking ASA 2x/week.

## 2018-03-19 ENCOUNTER — Encounter: Payer: Self-pay | Admitting: Internal Medicine

## 2018-03-19 ENCOUNTER — Non-Acute Institutional Stay (SKILLED_NURSING_FACILITY): Payer: PPO | Admitting: Internal Medicine

## 2018-03-19 DIAGNOSIS — F0150 Vascular dementia without behavioral disturbance: Secondary | ICD-10-CM | POA: Diagnosis not present

## 2018-03-19 DIAGNOSIS — A419 Sepsis, unspecified organism: Secondary | ICD-10-CM

## 2018-03-19 DIAGNOSIS — I214 Non-ST elevation (NSTEMI) myocardial infarction: Secondary | ICD-10-CM

## 2018-03-19 DIAGNOSIS — I152 Hypertension secondary to endocrine disorders: Secondary | ICD-10-CM

## 2018-03-19 DIAGNOSIS — E875 Hyperkalemia: Secondary | ICD-10-CM

## 2018-03-19 DIAGNOSIS — R9431 Abnormal electrocardiogram [ECG] [EKG]: Secondary | ICD-10-CM

## 2018-03-19 DIAGNOSIS — R7989 Other specified abnormal findings of blood chemistry: Secondary | ICD-10-CM

## 2018-03-19 DIAGNOSIS — B370 Candidal stomatitis: Secondary | ICD-10-CM

## 2018-03-19 DIAGNOSIS — E1169 Type 2 diabetes mellitus with other specified complication: Secondary | ICD-10-CM

## 2018-03-19 DIAGNOSIS — J189 Pneumonia, unspecified organism: Secondary | ICD-10-CM

## 2018-03-19 DIAGNOSIS — R1310 Dysphagia, unspecified: Secondary | ICD-10-CM

## 2018-03-19 DIAGNOSIS — L89619 Pressure ulcer of right heel, unspecified stage: Secondary | ICD-10-CM

## 2018-03-19 DIAGNOSIS — D649 Anemia, unspecified: Secondary | ICD-10-CM

## 2018-03-19 DIAGNOSIS — E1142 Type 2 diabetes mellitus with diabetic polyneuropathy: Secondary | ICD-10-CM

## 2018-03-19 DIAGNOSIS — E538 Deficiency of other specified B group vitamins: Secondary | ICD-10-CM | POA: Diagnosis not present

## 2018-03-19 DIAGNOSIS — E43 Unspecified severe protein-calorie malnutrition: Secondary | ICD-10-CM

## 2018-03-19 DIAGNOSIS — R338 Other retention of urine: Secondary | ICD-10-CM

## 2018-03-19 DIAGNOSIS — G9341 Metabolic encephalopathy: Secondary | ICD-10-CM

## 2018-03-19 DIAGNOSIS — E1149 Type 2 diabetes mellitus with other diabetic neurological complication: Secondary | ICD-10-CM

## 2018-03-19 DIAGNOSIS — E876 Hypokalemia: Secondary | ICD-10-CM

## 2018-03-19 DIAGNOSIS — N179 Acute kidney failure, unspecified: Secondary | ICD-10-CM

## 2018-03-19 DIAGNOSIS — I10 Essential (primary) hypertension: Secondary | ICD-10-CM

## 2018-03-19 DIAGNOSIS — R778 Other specified abnormalities of plasma proteins: Secondary | ICD-10-CM

## 2018-03-19 DIAGNOSIS — E785 Hyperlipidemia, unspecified: Secondary | ICD-10-CM

## 2018-03-19 DIAGNOSIS — E1159 Type 2 diabetes mellitus with other circulatory complications: Secondary | ICD-10-CM

## 2018-03-19 NOTE — Progress Notes (Signed)
:  Location:  [facility] Room Number: [phone] Place of Service:  SNF ([id number])  Jeremy Gill. Jeremy Hollingshead, MD  Patient Care Team: Jeremy Gill, Jeremy G, MD as PCP - General (Family Medicine) Jeremy Fairly, RN as [facility] Care Management  Extended Emergency Contact Information Primary Emergency Contact: [facility] Address: [address]          [place] [address] Jeremy Gill of [place] Home Phone: [phone] Mobile Phone: [phone] Relation: Spouse     Allergies: Patient has no known allergies.  Chief Complaint  Patient presents with  . New Admit To SNF    Admit to [facility]    HPI: Patient is 72 y.o. male with diabetes mellitus type 2, neuropathy, hypertension, hyperlipidemia, BPH, who was brought to the hospital by his family due to confusion wife at bedside stated that over the past couple weeks patient has been slightly more confused than his baseline.  She suspected he may have a degree of underlying dementia.  He was hospitalized in August of this year with acute encephalopathy and an improved, however at times even at home when he is here at baseline he appears somewhat confused.  She reported that over the last 2 weeks he been having a reversal sleep-wake pattern staying up all night and sleeping during the day she denied patient having fever chills or feeling hot.  There is no reported abdominal pain chest pain shortness of breath nausea vomiting or diarrhea.  He is fair per wife he has been complaining of increased lower back pain, he has had this pain for years however over the last month he stated that his home tramadol is no longer working.  He continues to smoke a pipe and has been resistant to quitting.  Wife reported he had a chronic cough but did not feel like it had gotten worse.  In the ED patient was found to be septic tachycardic tachypneic with increased WBCs.  Chest x-ray was concerning for multifocal pneumonia Patient  was started on vancomycin and cefepime.  Patient was admitted to [facility] from 10/17-22 where he was treated for sepsis secondary to multifocal pneumonia.  Hospital course was complicated by acute metabolic encephalopathy.  Patient was also felt to have had an NSTEMI.  Patient also had acute kidney injury secondary to sepsis and oral thrush.  Patient was also found to have very poorly controlled diabetes as well as severe protein calorie malnutrition.  Patient also had multiple pressure injuries to right elbow, right medial heel, and sacrum with deep tissue pressure injury.  In addition patient had anemia with folate deficiency, hypokalemia and hypomagnesemia and urinary retention requiring Foley placement with a failed voiding trial and patient being re-folate.  Patient is admitted to skilled nursing facility for OT/PT.  While at skilled nursing facility patient will be followed for hyperlipidemia treated with Lipitor, neuropathy treated with Neurontin and diabetes treated with insulin and metformin.  Past Medical History:  Diagnosis Date  . ANKLE SPRAIN 12/16/2006   Qualifier: Diagnosis of  By: Tawanna Cooler MD, Eugenio Hoes   . ANXIETY 10/25/2006  . CELLULITIS, LEG, RIGHT 09/08/2007   Qualifier: Diagnosis of  By: Tawanna Cooler MD, Eugenio Hoes   . Chronic pain disorder 11/30/2015  . COPD 10/25/2006  . 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 DIABETES MELLITUS, TYPE I 10/25/2006  . Diabetic polyneuropathy (HCC) 04/08/2007  Qualifier: Diagnosis of  By: Tawanna Cooler MD, Eugenio Hoes Dyshidrosis 06/08/2008  . Essential hypertension 10/25/2006   Qualifier: Diagnosis of  By: Rosette Reveal RN, Jorene Minors   . Generalized osteoarthritis of multiple sites 11/30/2015  . HYPERTENSION 10/25/2006  . 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 Low back pain with radiation 01/23/2016  .  Polyneuropathy in diabetes(357.2) 04/08/2007  . Psoriasis   . TOBACCO ABUSE 07/08/2007  . VENOUS INSUFFICIENCY 11/25/2007    Past Surgical History:  Procedure Laterality Date  . TESTICLE REMOVAL     R testicle    Allergies as of 03/19/2018   No Known Allergies     Medication List        Accurate as of 03/19/18 12:01 PM. Always use your most recent med list.          ACCU-CHEK SOFTCLIX LANCETS lancets Use as instructed   aspirin 81 MG chewable tablet Chew 81 mg by mouth 2 (two) times a week. Pt takes on Monday and Friday.   atorvastatin 40 MG tablet Commonly known as:  LIPITOR Take 1 tablet (40 mg total) by mouth daily.   baclofen 10 MG tablet Commonly known as:  LIORESAL TAKE 1 TABLET BY MOUTH 3 TIMES DAILY   budesonide-formoterol 160-4.5 MCG/ACT inhaler Commonly known as:  SYMBICORT Inhale 2 puffs into the lungs 2 (two) times daily.   cefdinir 300 MG capsule Commonly known as:  OMNICEF Take 1 capsule (300 mg total) by mouth every 12 (twelve) hours for 3 days.   collagenase ointment Commonly known as:  SANTYL Apply 1 application topically daily.   DULoxetine 60 MG capsule Commonly known as:  CYMBALTA TAKE 1 CAPSULE BY MOUTH EVERY DAY   enoxaparin 40 MG/0.4ML injection Commonly known as:  LOVENOX Inject 0.4 mLs (40 mg total) into the skin daily.   feeding supplement (ENSURE ENLIVE) Liqd Take 237 mLs by mouth 3 (three) times daily between meals.   fluconazole 100 MG tablet Commonly known as:  DIFLUCAN Take 1 tablet (100 mg total) by mouth daily for 4 days.   folic acid 1 MG tablet Commonly known as:  FOLVITE Take 1 tablet (1 mg total) by mouth daily.   gabapentin 600 MG tablet Commonly known as:  NEURONTIN Take 1 tablet (600 mg total) by mouth 3 (three) times daily.   guaiFENesin 600 MG 12 hr tablet Commonly known as:  MUCINEX Take 2 tablets (1,200 mg total) by mouth 2 (two) times daily for 3 days.   insulin aspart 100 UNIT/ML  injection Commonly known as:  novoLOG Inject 6 Units into the skin 3 (three) times daily with meals.   Insulin Glargine 100 UNIT/ML Solostar Pen Commonly known as:  LANTUS Inject 28 Units into the skin at bedtime.   INSULIN SYRINGE .5CC/31GX5/16" 31G X 5/16" 0.5 ML Misc USE AS DIRECTED WITH LANTUS   ipratropium-albuterol 0.5-2.5 (3) MG/3ML Soln Commonly known as:  DUONEB Take 3 mLs by nebulization every 6 (six) hours as needed.   isosorbide mononitrate 30 MG 24 hr tablet Commonly known as:  IMDUR Take 1 tablet (30 mg total) by mouth daily.   loratadine 10 MG tablet Commonly known as:  CLARITIN Take 1 tablet (10 mg total) by mouth daily.   metFORMIN 500 MG tablet Commonly known as:  GLUCOPHAGE Take 2 tablets (1,000 mg total) by mouth 2 (two) times daily with a meal.   metoprolol tartrate 50 MG  tablet Commonly known as:  LOPRESSOR Take 1 tablet (50 mg total) by mouth 2 (two) times daily.   multivitamin with minerals Tabs tablet Take 1 tablet by mouth daily.   nystatin 100000 UNIT/ML suspension Commonly known as:  MYCOSTATIN Take 5 mLs (500,000 Units total) by mouth 4 (four) times daily for 4 days.   ONETOUCH VERIO test strip Generic drug:  glucose blood Use to test blood sugar three-four times daily.   polyethylene glycol packet Commonly known as:  MIRALAX / GLYCOLAX Take 17 Gill by mouth daily.   Potassium Chloride ER 20 MEQ Tbcr Take 20 mEq by mouth daily.   RESOURCE THICKENUP CLEAR Powd Take 120 Gill by mouth as needed.   tamsulosin 0.4 MG Caps capsule Commonly known as:  FLOMAX Take 1 capsule (0.4 mg total) by mouth daily after breakfast. NEED OV.   traMADol 50 MG tablet Commonly known as:  ULTRAM Take 1 tablet (50 mg total) by mouth every 6 (six) hours as needed. for pain   triamcinolone ointment 0.1 % Commonly known as:  KENALOG Apply 1 application topically 2 (two) times daily as needed (rash). Compounded as a 1:1 mixture with Eucerin.       No orders  of the defined types were placed in this encounter.   Immunization History  Administered Date(s) Administered  . Influenza Split 04/26/2011, 03/12/2012  . Influenza Whole 05/28/2004, 04/03/2007, 03/10/2008, 02/23/2009, 04/05/2010  . Influenza, High Dose Seasonal PF 03/29/2015, 04/16/2017  . Influenza,inj,Quad PF,6+ Mos 03/24/2013, 01/19/2014  . Pneumococcal Conjugate-13 01/19/2014  . Pneumococcal Polysaccharide-23 05/28/2001, 04/08/2007, 02/08/2016  . Td 05/28/2001  . Tdap 08/07/2011  . Zoster 12/12/2012    Social History   Tobacco Use  . Smoking status: Current Every Day Smoker    Types: Pipe    Last attempt to quit: 05/29/2011    Years since quitting: 6.8  . Smokeless tobacco: Never Used  Substance Use Topics  . Alcohol use: No    Family history is   Family History  Problem Relation Age of Onset  . Cancer Mother        Unknown   . Diabetes Sister       Review of Systems  DATA OBTAINED: from patient-limited; nurse GENERAL:  no fevers, fatigue, appetite changes SKIN: No itching, or rash EYES: No eye pain, redness, discharge EARS: No earache, tinnitus, change in hearing NOSE: No congestion, drainage or bleeding  MOUTH/THROAT: No mouth or tooth pain, No sore throat RESPIRATORY: No cough, wheezing, SOB CARDIAC: No chest pain, palpitations, lower extremity edema  GI: No abdominal pain, No N/V/D or constipation, No heartburn or reflux  GU: No dysuria, frequency or urgency, or incontinence  MUSCULOSKELETAL: No unrelieved bone/joint pain NEUROLOGIC: No headache, dizziness or focal weakness PSYCHIATRIC: No c/o anxiety or sadness   Vitals:   03/19/18 1151  BP: (!) 109/56  Pulse: 94  Resp: 18  Temp: (!) 97.4 F (36.3 C)    SpO2 Readings from Last 1 Encounters:  03/18/18 95%   Body mass index is 21.29 kg/m.     Physical Exam  GENERAL APPEARANCE: Alert, conversant,  No acute distress.  SKIN: Dressings to right elbow right heel and sacrum HEAD:  Normocephalic, atraumatic  EYES: Conjunctiva/lids clear. Pupils round, reactive. EOMs intact.  EARS: External exam WNL, canals clear. Hearing grossly normal.  NOSE: No deformity or discharge.  MOUTH/THROAT: Lips w/o lesions  RESPIRATORY: Breathing is even, unlabored. Lung sounds are clear   CARDIOVASCULAR: Heart RRR no murmurs, rubs or  gallops. No peripheral edema.   GASTROINTESTINAL: Abdomen is soft, non-tender, not distended w/ normal bowel sounds. GENITOURINARY: Bladder non tender, not distended me: Foley catheter MUSCULOSKELETAL: No abnormal joints or musculature.  Very thin NEUROLOGIC:  Cranial nerves 2-12 grossly intact. Moves all extremities  PSYCHIATRIC: Mood and affect dementia, no behavioral issues  Patient Active Problem List   Diagnosis Date Noted  . Folate deficiency 03/16/2018  . Elevated troponin 03/14/2018  . Abnormal EKG 03/14/2018  . Chest pain 03/14/2018  . Oral candidiasis 03/14/2018  . Dysphagia 03/14/2018  . Malnutrition of moderate degree 03/14/2018  . HCAP (healthcare-associated pneumonia) 03/13/2018  . Abnormality of gait 02/27/2018  . Bilateral lower extremity edema 02/05/2018  . Pruritus of skin 02/05/2018  . Protein-calorie malnutrition, severe 01/16/2018  . Acute urinary retention 01/13/2018  . Acute encephalopathy 01/13/2018  . Obstipation 01/13/2018  . Chronic pain syndrome 11/01/2017  . Pressure injury of skin 10/02/2017  . Anemia   . Depression 09/30/2017  . Acute renal failure superimposed on stage 3 chronic kidney disease (HCC) 09/30/2017  . Sepsis (HCC) 09/30/2017  . Hypokalemia 09/30/2017  . Hyperlipidemia associated with type 2 diabetes mellitus (HCC) 02/05/2017  . AKI (acute kidney injury) (HCC) 12/19/2016  . Acute metabolic encephalopathy 12/19/2016  . DKA (diabetic ketoacidoses) (HCC) 12/18/2016  . Hyperkalemia 12/18/2016  . Anxiety disorder, unspecified 02/08/2016  . Low back pain with radiation 01/23/2016  . Chronic pain disorder  11/30/2015  . Generalized osteoarthritis of multiple sites 11/30/2015  . DYSHIDROSIS 06/08/2008  . VENOUS INSUFFICIENCY 11/25/2007  . 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 TOBACCO ABUSE 07/08/2007  . Diabetic polyneuropathy (HCC) 04/08/2007  . 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 Essential hypertension 10/25/2006  . COPD (chronic obstructive pulmonary disease) (HCC) 10/25/2006      Labs reviewed: Basic Metabolic Panel:    Component Value Date/Time   NA 140 03/18/2018 0619   NA 132 (L) 07/05/2016 1426   K 3.3 (L) 03/18/2018 0619   CL 107 03/18/2018 0619   CO2 27 03/18/2018 0619   GLUCOSE 329 (H) 03/18/2018 0619   GLUCOSE 79 03/05/2006 1042   BUN 26 (H) 03/18/2018 0619   BUN 24 07/05/2016 1426   CREATININE 0.99 03/18/2018 0619   CALCIUM 7.9 (L) 03/18/2018 0619   PROT 5.9 (L) 03/14/2018 0334   PROT 7.1 02/26/2017 1630   ALBUMIN 2.3 (L) 03/14/2018 0334   ALBUMIN 4.4 02/26/2017 1630   AST 19 03/14/2018 0334   ALT 14 03/14/2018 0334   ALKPHOS 36 (L) 03/14/2018 0334   BILITOT 0.9 03/14/2018 0334   BILITOT 0.4 02/26/2017 1630   GFRNONAA >60 03/18/2018 0619   GFRAA >60 03/18/2018 1914    Recent Labs    10/03/17 0516 10/04/17 0511  01/14/18 0328  03/16/18 0103 03/17/18 0450 03/17/18 1632 03/18/18 0619  NA 139 141   < > 146*   < > 139 141  --  140  K 3.4* 3.5   < > 4.5   < > 3.4* 3.0* 2.6* 3.3*  CL 101 106   < > 108   < > 109 108  --  107  CO2 21* 24   < > 30   < > 22 24  --  27  GLUCOSE 258* 168*   < > 74   < > 443* 328*  --  329*  BUN 15 13   < > 27*   < > 26* 26*  --  26*  CREATININE 0.94 0.83   < > 1.21   < > 1.18 1.07  --  0.99  CALCIUM 9.0 8.6*   < > 9.2   < > 7.9* 8.1*  --  7.9*  MG 1.1* 1.6*  --  1.7   < > 1.8 1.7  --  1.9  PHOS 3.6 3.3  --  4.7*  --   --   --   --   --    < > = values in this interval not displayed.   Liver Function Tests: Recent Labs    01/14/18 0328 03/13/18 1317 03/14/18 0334  AST 21  25 19   ALT 13 16 14   ALKPHOS 35* 43 36*  BILITOT 0.8 1.2 0.9  PROT 6.7 6.7 5.9*  ALBUMIN 3.2* 2.8* 2.3*   No results for input(s): LIPASE, AMYLASE in the last 8760 hours. Recent Labs    09/30/17 2101 10/15/17 0953  AMMONIA 12 <9*   CBC: Recent Labs    01/13/18 1747  03/13/18 1317  03/15/18 0734 03/16/18 0103 03/16/18 2120 03/17/18 0450 03/18/18 0619  WBC 13.6*   < > 12.8*   < > 10.8* 10.6*  --  12.6* 10.2  NEUTROABS 11.8*  --  10.5*  --  8.1*  --   --   --   --   HGB 11.7*   < > 10.5*   < > 8.6* 7.4* 9.5* 9.7* 9.7*  HCT 35.3*   < > 32.6*   < > 26.6* 23.1* 28.7* 29.8* 29.8*  MCV 90.3   < > 94.8   < > 92.4 93.5  --  91.7 91.4  PLT 311   < > 263   < > 255 236  --  236 271   < > = values in this interval not displayed.   Lipid Recent Labs    03/14/18 0928  CHOL 67  HDL 29*  LDLCALC 28  TRIG 49    Cardiac Enzymes: Recent Labs    03/13/18 1317  03/14/18 0928 03/14/18 1552 03/15/18 0827  CKTOTAL 94  --   --   --   --   TROPONINI  --    < > 1.23* 1.11* 0.81*   < > = values in this interval not displayed.   BNP: No results for input(s): BNP in the last 8760 hours. Lab Results  Component Value Date   MICROALBUR <0.7 04/16/2017   Lab Results  Component Value Date   HGBA1C 9.1 (H) 01/14/2018   Lab Results  Component Value Date   TSH 0.760 01/14/2018   Lab Results  Component Value Date   VITAMINB12 (NOTE) 03/16/2018   Lab Results  Component Value Date   FOLATE 3.7 (L) 03/16/2018   Lab Results  Component Value Date   IRON 21 (L) 03/16/2018   TIBC 132 (L) 03/16/2018   FERRITIN 169 03/16/2018    Imaging and Procedures obtained prior to SNF admission: Dg Chest 2 View  Result Date: 03/13/2018 CLINICAL DATA:  Possible sepsis EXAM: CHEST - 2 VIEW COMPARISON:  Chest x-ray of August 1929 FINDINGS: The lungs are mildly hyperinflated with hemidiaphragm flattening. There is a new dense infiltrate in the left lower lobe posteriorly. There are patchy  airspace opacities elsewhere in the left lung and to a lesser extent in the right lung worrisome for septic emboli. The heart and pulmonary vascularity are normal. The mediastinum is normal in width. There is calcification in the wall of the aortic arch. IMPRESSION: COPD. Left  lower lobe pneumonia. Patchy airspace opacities elsewhere in the left lung and to a lesser extent on the right worrisome for multifocal pneumonia or septic emboli. Electronically Signed   By: David  Jeremy Gill M.D.   On: 03/13/2018 13:53   Ct Head Wo Contrast  Result Date: 03/13/2018 CLINICAL DATA:  72 year old male with unexplained altered mental status. EXAM: CT HEAD WITHOUT CONTRAST TECHNIQUE: Contiguous axial images were obtained from the base of the skull through the vertex without intravenous contrast. COMPARISON:  Brain MRI 01/14/2018, head CT 01/13/2018 and earlier. FINDINGS: Brain: Chronic confluent cerebral white matter hypodensity, moderate to severe heterogeneity in the left deep gray matter nuclei, and pons. Stable gray-white matter differentiation throughout the brain. No cortical encephalomalacia identified. No midline shift, ventriculomegaly, mass effect, evidence of mass lesion, intracranial hemorrhage or evidence of cortically based acute infarction. Vascular: Calcified atherosclerosis at the skull base. No suspicious intracranial vascular hyperdensity. Skull: Negative. Sinuses/Orbits: Visualized paranasal sinuses and mastoids are stable and well pneumatized. Other: Stable orbit and scalp soft tissues. IMPRESSION: No acute intracranial abnormality. Stable non contrast CT appearance of the brain with advanced chronic small vessel disease. Electronically Signed   By: Odessa Fleming M.D.   On: 03/13/2018 14:14     Not all labs, radiology exams or other studies done during hospitalization come through on my EPIC note; however they are reviewed by me.    Assessment and Plan  Sepsis/multifocal pneumonia/healthcare associated  pneumonia- multifocal pneumonia, patient with tachycardia leukocytosis elevated lactic acid all other panels were negative.  Patient was started on IV vancomycin and cefepime and as patient improved clinically and antibiotics were narrowed down to oral [facility] SNF- admitted for OT/PT will continue Omnicef 300 mg every 12 hours for 3 more days  Elevated troponin/EKG changes/NSTEMI- initial troponin 0 0.83 with a rise as high as 1.64.  EKG shows some ST segment depressions in leads V3 through V6 2D echo with EF of 60 to 65%, grade 1 diastolic dysfunction.  Serial enzymes slowly trending down.  Patient was treated with heparin drip which was discontinued per cardiology on 10/20; patient has been seen in consultation by cardiology who feel that given the patient's acute illness, anemia, acute kidney injury, confusion, underlying dementia, and overall poor shape that it is best to treat medically at this time.  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 SNF- supportive care  Multiple pressure injuries/severe protein malnutrition- pressure injuries to right elbow, right medial heel, and sacrum with deep tissue pressure injury SNF- will be followed by wound care nurse at skilled nursing facility as well as we will have nutritional support with protein supplements  Anemia/folate deficiency/hypokalemia/hypomagnesemia- patient with no overt bleeding, FOBT negative hemoglobin 7.4 on 10/20; transfused with 2 units PRBC with hemoglobin rising to 9.7; potassium and magnesium were repleted SNF- we will follow-up BMP as  well as magnesium   Urinary retention- patient Flomax was continued during hospitalization Foley catheter was placed on admission secondary to severe illness; patient failed voiding trial and Foley was replaced; patient is discharged with Foley catheter and Flomax SNF- will perform voiding trial in 3 to 5 days; continue Flomax 0.4 mg daily  Diabetes mellitus type 2/diabetic neuropathy- hemoglobin A1c 9.1 in August 2019 SNF- patient discharged on Lantus 28 units as well as 6 units with every meal along with Glucophage 1000 mg twice daily  Hypertension SNF- continue metoprolol 50 mg twice daily and Imdur 30 mg daily  Hyperlipidemia SNF-continue Lipitor 40 mg daily  Acute kidney injury- improved with IV hydration SNF- we will follow-up BMP  Oral candidiasis/dysphasia- patient treated with initial IV Diflucan and then oral Diflucan along with nystatin swish and swallow; patient was seen by speech therapy and started on dysphagia 3 diet with nectar thick liquids SNF- continue oral Diflucan for 4 more days and nystatin for 4 more days; will have patient on dysphagia 3 diet with nectar thick liquids until speech therapy has a chance to evaluate him  Diabetic neuropathy continue Neurontin 600 mg 3 times daily    Time spent greater than 45 minutes;> 50% of time with patient was spent reviewing records, labs, tests and studies, counseling and developing plan of care  Thurston Hole D. Jeremy Hollingshead, MD

## 2018-03-21 MED ORDER — ASPIRIN EC 81 MG PO TBEC: 81.0000 mg | DELAYED_RELEASE_TABLET | Freq: Every day | ORAL | 3 refills | Status: DC

## 2018-03-21 NOTE — Telephone Encounter (Signed)
Called and made Dr. Gordy Councilman at [facility] aware that the patient should take ASA 81 mg QD.

## 2018-03-25 ENCOUNTER — Encounter: Payer: Self-pay | Admitting: Internal Medicine

## 2018-03-25 DIAGNOSIS — F0150 Vascular dementia without behavioral disturbance: Secondary | ICD-10-CM | POA: Insufficient documentation

## 2018-03-25 DIAGNOSIS — I214 Non-ST elevation (NSTEMI) myocardial infarction: Secondary | ICD-10-CM | POA: Insufficient documentation

## 2018-03-26 ENCOUNTER — Encounter

## 2018-03-26 ENCOUNTER — Ambulatory Visit: Payer: PPO | Admitting: Podiatry

## 2018-03-26 LAB — CBC AND DIFFERENTIAL
HEMATOCRIT: 26 — AB (ref 41–53)
HEMOGLOBIN: 9 — AB (ref 13.5–17.5)
Platelets: 527 — AB (ref 150–399)
WBC: 10.9

## 2018-03-26 LAB — BASIC METABOLIC PANEL
BUN: 15 (ref 4–21)
CREATININE: 0.8 (ref 0.6–1.3)
Glucose: 175
POTASSIUM: 3.9 (ref 3.4–5.3)
Sodium: 139 (ref 137–147)

## 2018-03-28 ENCOUNTER — Other Ambulatory Visit: Payer: Self-pay | Admitting: *Deleted

## 2018-03-28 NOTE — Patient Outreach (Signed)
[facility]) Care Management  03/28/2018  JERIN FRANZEL 10/27/1945 [id number]   Met with Katrinka Blazing SW and discharge planner at [facility] to discuss patient's d/c.  Marita [place] states patient's plan for discharge is to return home with his spouse who is his main caregiver. Marita [place] states patient is somewhat confused and it is best to speak with spouse about patient needs.  Marita [place] states there is no discharge date planned at this time.   Went to patient's room.  Patient was sleeping in his chair, no family present.   Plan to visit patient next week at facility visit.  Plan to call spouse if she is not present at next weeks visit.   Rutherford Limerick RN, BSN [facility] Acute Care Coordinator [phone]) Business Mobile [phone]) Toll free office

## 2018-03-31 DIAGNOSIS — L89613 Pressure ulcer of right heel, stage 3: Secondary | ICD-10-CM | POA: Diagnosis not present

## 2018-03-31 DIAGNOSIS — L89153 Pressure ulcer of sacral region, stage 3: Secondary | ICD-10-CM | POA: Diagnosis not present

## 2018-04-03 ENCOUNTER — Encounter: Payer: Self-pay | Admitting: Internal Medicine

## 2018-04-03 ENCOUNTER — Non-Acute Institutional Stay (SKILLED_NURSING_FACILITY): Payer: PPO | Admitting: Internal Medicine

## 2018-04-03 DIAGNOSIS — I214 Non-ST elevation (NSTEMI) myocardial infarction: Secondary | ICD-10-CM

## 2018-04-03 DIAGNOSIS — L89619 Pressure ulcer of right heel, unspecified stage: Secondary | ICD-10-CM

## 2018-04-03 DIAGNOSIS — L299 Pruritus, unspecified: Secondary | ICD-10-CM | POA: Diagnosis not present

## 2018-04-03 DIAGNOSIS — E43 Unspecified severe protein-calorie malnutrition: Secondary | ICD-10-CM | POA: Diagnosis not present

## 2018-04-03 DIAGNOSIS — E785 Hyperlipidemia, unspecified: Secondary | ICD-10-CM

## 2018-04-03 DIAGNOSIS — A419 Sepsis, unspecified organism: Secondary | ICD-10-CM

## 2018-04-03 DIAGNOSIS — Z4800 Encounter for change or removal of nonsurgical wound dressing: Secondary | ICD-10-CM

## 2018-04-03 DIAGNOSIS — F0150 Vascular dementia without behavioral disturbance: Secondary | ICD-10-CM | POA: Diagnosis not present

## 2018-04-03 DIAGNOSIS — E538 Deficiency of other specified B group vitamins: Secondary | ICD-10-CM

## 2018-04-03 DIAGNOSIS — R778 Other specified abnormalities of plasma proteins: Secondary | ICD-10-CM

## 2018-04-03 DIAGNOSIS — J449 Chronic obstructive pulmonary disease, unspecified: Secondary | ICD-10-CM

## 2018-04-03 DIAGNOSIS — N183 Chronic kidney disease, stage 3 (moderate): Secondary | ICD-10-CM

## 2018-04-03 DIAGNOSIS — R7989 Other specified abnormal findings of blood chemistry: Secondary | ICD-10-CM

## 2018-04-03 DIAGNOSIS — J181 Lobar pneumonia, unspecified organism: Secondary | ICD-10-CM

## 2018-04-03 DIAGNOSIS — G9340 Encephalopathy, unspecified: Secondary | ICD-10-CM | POA: Diagnosis not present

## 2018-04-03 DIAGNOSIS — R9431 Abnormal electrocardiogram [ECG] [EKG]: Secondary | ICD-10-CM

## 2018-04-03 DIAGNOSIS — E1169 Type 2 diabetes mellitus with other specified complication: Secondary | ICD-10-CM

## 2018-04-03 DIAGNOSIS — E1149 Type 2 diabetes mellitus with other diabetic neurological complication: Secondary | ICD-10-CM

## 2018-04-03 DIAGNOSIS — E876 Hypokalemia: Secondary | ICD-10-CM

## 2018-04-03 DIAGNOSIS — D649 Anemia, unspecified: Secondary | ICD-10-CM

## 2018-04-03 DIAGNOSIS — E1142 Type 2 diabetes mellitus with diabetic polyneuropathy: Secondary | ICD-10-CM

## 2018-04-03 DIAGNOSIS — R338 Other retention of urine: Secondary | ICD-10-CM

## 2018-04-03 DIAGNOSIS — IMO0002 Reserved for concepts with insufficient information to code with codable children: Secondary | ICD-10-CM

## 2018-04-03 DIAGNOSIS — F419 Anxiety disorder, unspecified: Secondary | ICD-10-CM

## 2018-04-03 DIAGNOSIS — N179 Acute kidney failure, unspecified: Secondary | ICD-10-CM

## 2018-04-03 DIAGNOSIS — J189 Pneumonia, unspecified organism: Secondary | ICD-10-CM | POA: Insufficient documentation

## 2018-04-03 MED ORDER — VITAMIN C 500 MG PO TABS: 500.0000 mg | ORAL_TABLET | Freq: Two times a day (BID) | ORAL | 0 refills | Status: DC

## 2018-04-03 MED ORDER — METOPROLOL TARTRATE 50 MG PO TABS: 50.0000 mg | ORAL_TABLET | Freq: Two times a day (BID) | ORAL | 0 refills | Status: AC

## 2018-04-03 MED ORDER — IPRATROPIUM-ALBUTEROL 0.5-2.5 (3) MG/3ML IN SOLN: 3.0000 mL | Freq: Four times a day (QID) | RESPIRATORY_TRACT | 0 refills | Status: AC | PRN

## 2018-04-03 MED ORDER — DULOXETINE HCL 60 MG PO CPEP: ORAL_CAPSULE | ORAL | 0 refills | Status: AC

## 2018-04-03 MED ORDER — FOLIC ACID 1 MG PO TABS: 1.0000 mg | ORAL_TABLET | Freq: Every day | ORAL | Status: AC

## 2018-04-03 MED ORDER — ISOSORBIDE MONONITRATE ER 30 MG PO TB24: 30.0000 mg | ORAL_TABLET | Freq: Every day | ORAL | 0 refills | Status: DC

## 2018-04-03 MED ORDER — TRIAMCINOLONE ACETONIDE 0.1 % EX OINT: 1.0000 "application " | TOPICAL_OINTMENT | Freq: Two times a day (BID) | CUTANEOUS | 1 refills | Status: DC | PRN

## 2018-04-03 MED ORDER — GLUCOSE BLOOD VI STRP: ORAL_STRIP | 3 refills | Status: AC

## 2018-04-03 MED ORDER — BUDESONIDE-FORMOTEROL FUMARATE 160-4.5 MCG/ACT IN AERO: 2.0000 | INHALATION_SPRAY | Freq: Two times a day (BID) | RESPIRATORY_TRACT | 0 refills | Status: AC

## 2018-04-03 MED ORDER — "INSULIN SYRINGE 31G X 5/16"" 0.5 ML MISC": 5 refills | Status: AC

## 2018-04-03 MED ORDER — TAMSULOSIN HCL 0.4 MG PO CAPS: 0.4000 mg | ORAL_CAPSULE | Freq: Every day | ORAL | 0 refills | Status: AC

## 2018-04-03 MED ORDER — GABAPENTIN 600 MG PO TABS: 600.0000 mg | ORAL_TABLET | Freq: Three times a day (TID) | ORAL | 0 refills | Status: DC

## 2018-04-03 MED ORDER — LORATADINE 10 MG PO TABS: 10.0000 mg | ORAL_TABLET | Freq: Every day | ORAL | Status: DC

## 2018-04-03 MED ORDER — POTASSIUM CHLORIDE ER 20 MEQ PO TBCR: 20.0000 meq | EXTENDED_RELEASE_TABLET | Freq: Every day | ORAL | Status: DC

## 2018-04-03 MED ORDER — CALCIUM CARBONATE ANTACID 500 MG PO CHEW: 1.0000 | CHEWABLE_TABLET | Freq: Two times a day (BID) | ORAL | 0 refills | Status: DC

## 2018-04-03 MED ORDER — ATORVASTATIN CALCIUM 40 MG PO TABS: 40.0000 mg | ORAL_TABLET | Freq: Every day | ORAL | 3 refills | Status: AC

## 2018-04-03 MED ORDER — INSULIN GLARGINE 100 UNIT/ML SOLOSTAR PEN: 28.0000 [IU] | PEN_INJECTOR | Freq: Every day | SUBCUTANEOUS | 0 refills | Status: DC

## 2018-04-03 MED ORDER — POLYETHYLENE GLYCOL 3350 17 G PO PACK: 17.0000 g | PACK | Freq: Every day | ORAL | 0 refills | Status: AC

## 2018-04-03 MED ORDER — BACLOFEN 10 MG PO TABS: 10.0000 mg | ORAL_TABLET | Freq: Three times a day (TID) | ORAL | 1 refills | Status: DC

## 2018-04-03 MED ORDER — ASPIRIN EC 81 MG PO TBEC: 81.0000 mg | DELAYED_RELEASE_TABLET | Freq: Every day | ORAL | 3 refills | Status: AC

## 2018-04-03 MED ORDER — METFORMIN HCL 500 MG PO TABS: 1000.0000 mg | ORAL_TABLET | Freq: Two times a day (BID) | ORAL | 4 refills | Status: DC

## 2018-04-03 MED ORDER — ADULT MULTIVITAMIN W/MINERALS CH: 1.0000 | ORAL_TABLET | Freq: Every day | ORAL | Status: DC

## 2018-04-03 NOTE — Progress Notes (Signed)
Location:  [facility] Room Number: [phone] Place of Service:  SNF [phone])  Jeremy Gill. [name], MD  Patient Care Team: [place], [name], MD as PCP - General [facility] Medicine)  Extended Emergency Contact Information Primary Emergency Contact: [facility] Address: [address]          [place] [address] [place] Home Phone: [phone] Mobile Phone: [phone] Relation: Spouse  No Known Allergies  Chief Complaint  Patient presents with  . Discharge Note    Discharge from [facility]    HPI:  72 y.o. male with diabetes mellitus type 2, neuropathy, hypertension, hyperlipidemia, BPH who was brought to the hospital by his family due to confusion.  Wife at the bedside stated that over the past couple weeks patient has been slightly more confused than baseline.  She suspected he may have a degree of underlying dementia.  In the ED patient was found to be septic, tachycardic, tachypnea, and with increased WBCs.  Chest x-ray was concerning for multifocal pneumonia.  Patient was started on vancomycin and cefepime.  Patient was admitted to [facility] from 10/17-22 where he was treated for sepsis secondary to multifocal pneumonia.  Hospital course was complicated by acute metabolic encephalopathy.  Patient also felt to have had an NSTEMI.  Patient also had acute kidney injury secondary to sepsis and had oral thrush.  Patient was also found to have very poorly controlled diabetes as well as severe protein calorie malnutrition.  Patient had multiple pressure injuries to right elbow, right medial heel, and sacrum with deep tissue pressure injury.  In addition patient had anemia with folate deficiency, hypokalemia and hypomagnesemia, and urinary retention requiring Foley placement.  Patient had a failed voiding trial and Foley had to be replaced.  Patient was admitted to skilled nursing facility for OT/PT and is now ready to be discharged  home.    Past Medical History:  Diagnosis Date  . ANKLE SPRAIN 12/16/2006   Qualifier: Diagnosis of  By: Tawanna Cooler MD, Eugenio Hoes   . ANXIETY 10/25/2006  . CELLULITIS, LEG, RIGHT 09/08/2007   Qualifier: Diagnosis of  By: Tawanna Cooler MD, Eugenio Hoes   . Chronic pain disorder 11/30/2015  . COPD 10/25/2006  . 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 DIABETES MELLITUS, TYPE I 10/25/2006  . Diabetic polyneuropathy (HCC) 04/08/2007   Qualifier: Diagnosis of  By: Tawanna Cooler MD, Eugenio Hoes   . 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 Essential hypertension 10/25/2006   Qualifier: Diagnosis of  By: Rosette Reveal RN, Jorene Minors   . Generalized osteoarthritis of multiple sites 11/30/2015  . HYPERTENSION 10/25/2006  . 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 Low back pain with radiation 01/23/2016  . Polyneuropathy in diabetes(357.2) 04/08/2007  . Psoriasis   . TOBACCO ABUSE 07/08/2007  . VENOUS INSUFFICIENCY 11/25/2007    Past Surgical History:  Procedure Laterality Date  . TESTICLE REMOVAL     R testicle     reports that he has been smoking pipe. He has never used smokeless tobacco. He reports that he does not drink alcohol or use drugs. Social History   Socioeconomic History  . Marital status: Married    Spouse name: Not on file  . Number of children: Not on file  . Years of education: Not on file  . Highest  education level: Not on file  Occupational History  . Occupation: Self Employed  Social Needs  . Financial resource strain: Not on file  . Food insecurity:    Worry: Not on file    Inability: Not on file  . Transportation needs:    Medical: Not on file    Non-medical: Not on file  Tobacco Use  . Smoking status: Current Every Day Smoker    Types: Pipe    Last attempt to quit: 05/29/2011    Years since quitting: 6.8  . Smokeless tobacco: Never Used  Substance and Sexual  Activity  . Alcohol use: No  . Drug use: No  . Sexual activity: Not on file  Lifestyle  . Physical activity:    Days per week: Not on file    Minutes per session: Not on file  . Stress: Not on file  Relationships  . Social connections:    Talks on phone: Not on file    Gets together: Not on file    Attends religious service: Not on file    Active member of club or organization: Not on file    Attends meetings of clubs or organizations: Not on file    Relationship status: Not on file  . Intimate partner violence:    Fear of current or ex partner: Not on file    Emotionally abused: Not on file    Physically abused: Not on file    Forced sexual activity: Not on file  Other Topics Concern  . Not on file  Social History Narrative   Regular exercise-no    Pertinent  Health Maintenance Due  Topic Date Due  . OPHTHALMOLOGY EXAM  12/18/2013  . INFLUENZA VACCINE  12/26/2017  . FOOT EXAM  01/01/2018  . COLONOSCOPY  04/21/2018 (Originally 09/05/2015)  . URINE MICROALBUMIN  04/16/2018  . HEMOGLOBIN A1C  07/17/2018  . PNA vac Low Risk Adult  Completed    Medications: Allergies as of 04/03/2018   No Known Allergies     Medication List        Accurate as of 04/03/18  4:49 PM. Always use your most recent med list.          ACCU-CHEK SOFTCLIX LANCETS lancets Use as instructed   aspirin EC 81 MG tablet Take 1 tablet (81 mg total) by mouth daily.   atorvastatin 40 MG tablet Commonly known as:  LIPITOR Take 1 tablet (40 mg total) by mouth daily.   baclofen 10 MG tablet Commonly known as:  LIORESAL Take 1 tablet (10 mg total) by mouth 3 (three) times daily.   budesonide-formoterol 160-4.5 MCG/ACT inhaler Commonly known as:  SYMBICORT Inhale 2 puffs into the lungs 2 (two) times daily.   calcium carbonate 500 MG chewable tablet Commonly known as:  TUMS - dosed in mg elemental calcium Chew 1 tablet (200 mg of elemental calcium total) by mouth 2 (two) times daily.     DULoxetine 60 MG capsule Commonly known as:  CYMBALTA TAKE 1 CAPSULE BY MOUTH EVERY DAY   folic acid 1 MG tablet Commonly known as:  FOLVITE Take 1 tablet (1 mg total) by mouth daily.   gabapentin 600 MG tablet Commonly known as:  NEURONTIN Take 1 tablet (600 mg total) by mouth 3 (three) times daily.   glucose blood test strip Use to test blood sugar three-four times daily.   insulin aspart 100 UNIT/ML injection Commonly known as:  novoLOG Inject 6 Units into the skin 3 (three) times  daily with meals.   Insulin Glargine 100 UNIT/ML Solostar Pen Commonly known as:  LANTUS Inject 28 Units into the skin at bedtime.   INSULIN SYRINGE .5CC/31GX5/16" 31G X 5/16" 0.5 ML Misc USE AS DIRECTED WITH LANTUS   ipratropium-albuterol 0.5-2.5 (3) MG/3ML Soln Commonly known as:  DUONEB Take 3 mLs by nebulization every 6 (six) hours as needed.   isosorbide mononitrate 30 MG 24 hr tablet Commonly known as:  IMDUR Take 1 tablet (30 mg total) by mouth daily.   loratadine 10 MG tablet Commonly known as:  CLARITIN Take 1 tablet (10 mg total) by mouth daily.   metFORMIN 500 MG tablet Commonly known as:  GLUCOPHAGE Take 2 tablets (1,000 mg total) by mouth 2 (two) times daily with a meal.   metoprolol tartrate 50 MG tablet Commonly known as:  LOPRESSOR Take 1 tablet (50 mg total) by mouth 2 (two) times daily.   multivitamin with minerals Tabs tablet Take 1 tablet by mouth daily.   polyethylene glycol packet Commonly known as:  MIRALAX / GLYCOLAX Take 17 g by mouth daily.   Potassium Chloride ER 20 MEQ Tbcr Take 20 mEq by mouth daily.   tamsulosin 0.4 MG Caps capsule Commonly known as:  FLOMAX Take 1 capsule (0.4 mg total) by mouth daily after breakfast. NEED OV.   triamcinolone ointment 0.1 % Commonly known as:  KENALOG Apply 1 application topically 2 (two) times daily as needed (rash). Compounded as a 1:1 mixture with Eucerin.   vitamin C 500 MG tablet Commonly known as:   ASCORBIC ACID Take 1 tablet (500 mg total) by mouth 2 (two) times daily.        Vitals:   04/03/18 1016  BP: (!) 95/57  Pulse: 81  Resp: 20  Temp: (!) 97.4 F (36.3 C)  Weight: 157 lb (71.2 kg)  Height: 5\' 10"  (1.778 m)   Body mass index is 22.53 kg/m.  Physical Exam  GENERAL APPEARANCE: Alert, conversant. No acute distress.  HEENT: Unremarkable. RESPIRATORY: Breathing is even, unlabored. Lung sounds are clear   CARDIOVASCULAR: Heart RRR no murmurs, rubs or gallops. No peripheral edema.  GASTROINTESTINAL: Abdomen is soft, non-tender, not distended w/ normal bowel sounds.  NEUROLOGIC: Cranial nerves 2-12 grossly intact. Moves all extremities   Labs reviewed: Basic Metabolic Panel: Recent Labs    10/03/17 0516 10/04/17 0511  01/14/18 0328  03/16/18 0103 03/17/18 0450 03/17/18 1632 03/18/18 0619 03/26/18  NA 139 141   < > 146*   < > 139 141  --  140 139  K 3.4* 3.5   < > 4.5   < > 3.4* 3.0* 2.6* 3.3* 3.9  CL 101 106   < > 108   < > 109 108  --  107  --   CO2 21* 24   < > 30   < > 22 24  --  27  --   GLUCOSE 258* 168*   < > 74   < > 443* 328*  --  329*  --   BUN 15 13   < > 27*   < > 26* 26*  --  26* 15  CREATININE 0.94 0.83   < > 1.21   < > 1.18 1.07  --  0.99 0.8  CALCIUM 9.0 8.6*   < > 9.2   < > 7.9* 8.1*  --  7.9*  --   MG 1.1* 1.6*  --  1.7   < > 1.8 1.7  --  1.9  --   PHOS 3.6 3.3  --  4.7*  --   --   --   --   --   --    < > = values in this interval not displayed.   Lab Results  Component Value Date   MICROALBUR <0.7 04/16/2017   Liver Function Tests: Recent Labs    01/14/18 0328 03/13/18 1317 03/14/18 0334  AST 21 25 19   ALT 13 16 14   ALKPHOS 35* 43 36*  BILITOT 0.8 1.2 0.9  PROT 6.7 6.7 5.9*  ALBUMIN 3.2* 2.8* 2.3*   No results for input(s): LIPASE, AMYLASE in the last 8760 hours. Recent Labs    09/30/17 2101 10/15/17 0953  AMMONIA 12 <9*   CBC: Recent Labs    01/13/18 1747  03/13/18 1317  03/15/18 0734 03/16/18 0103   03/17/18 0450 03/18/18 0619 03/26/18  WBC 13.6*   < > 12.8*   < > 10.8* 10.6*  --  12.6* 10.2 10.9  NEUTROABS 11.8*  --  10.5*  --  8.1*  --   --   --   --   --   HGB 11.7*   < > 10.5*   < > 8.6* 7.4*   < > 9.7* 9.7* 9.0*  HCT 35.3*   < > 32.6*   < > 26.6* 23.1*   < > 29.8* 29.8* 26*  MCV 90.3   < > 94.8   < > 92.4 93.5  --  91.7 91.4  --   PLT 311   < > 263   < > 255 236  --  236 271 527*   < > = values in this interval not displayed.   Lipid Recent Labs    03/14/18 0928  CHOL 67  HDL 29*  LDLCALC 28  TRIG 49   Cardiac Enzymes: Recent Labs    03/13/18 1317  03/14/18 0928 03/14/18 1552 03/15/18 0827  CKTOTAL 94  --   --   --   --   TROPONINI  --    < > 1.23* 1.11* 0.81*   < > = values in this interval not displayed.   BNP: No results for input(s): BNP in the last 8760 hours. CBG: Recent Labs    03/17/18 2134 03/18/18 0732 03/18/18 1211  GLUCAP 328* 309* 297*    Procedures and Imaging Studies During Stay: Dg Chest 2 View  Result Date: 03/13/2018 CLINICAL DATA:  Possible sepsis EXAM: CHEST - 2 VIEW COMPARISON:  Chest x-ray of August 1929 FINDINGS: The lungs are mildly hyperinflated with hemidiaphragm flattening. There is a new dense infiltrate in the left lower lobe posteriorly. There are patchy airspace opacities elsewhere in the left lung and to a lesser extent in the right lung worrisome for septic emboli. The heart and pulmonary vascularity are normal. The mediastinum is normal in width. There is calcification in the wall of the aortic arch. IMPRESSION: COPD. Left lower lobe pneumonia. Patchy airspace opacities elsewhere in the left lung and to a lesser extent on the right worrisome for multifocal pneumonia or septic emboli. Electronically Signed   By: David  [place] M.D.   On: 03/13/2018 13:53   Ct Head Wo Contrast  Result Date: 03/13/2018 CLINICAL DATA:  72 year old male with unexplained altered mental status. EXAM: CT HEAD WITHOUT CONTRAST TECHNIQUE: Contiguous  axial images were obtained from the base of the skull through the vertex without intravenous contrast. COMPARISON:  Brain MRI 01/14/2018, head CT 01/13/2018 and earlier. FINDINGS: Brain:  Chronic confluent cerebral white matter hypodensity, moderate to severe heterogeneity in the left deep gray matter nuclei, and pons. Stable gray-white matter differentiation throughout the brain. No cortical encephalomalacia identified. No midline shift, ventriculomegaly, mass effect, evidence of mass lesion, intracranial hemorrhage or evidence of cortically based acute infarction. Vascular: Calcified atherosclerosis at the skull base. No suspicious intracranial vascular hyperdensity. Skull: Negative. Sinuses/Orbits: Visualized paranasal sinuses and mastoids are stable and well pneumatized. Other: Stable orbit and scalp soft tissues. IMPRESSION: No acute intracranial abnormality. Stable non contrast CT appearance of the brain with advanced chronic small vessel disease. Electronically Signed   By: Odessa Fleming M.D.   On: 03/13/2018 14:14   Dg Swallowing Func-speech Pathology  Result Date: 03/17/2018 Objective Swallowing Evaluation: Type of Study: MBS-Modified Barium Swallow Study  Patient Details Name: ADRICK KESTLER MRN: [id number] Date of Birth: Feb 19, 1946 Today's Date: 03/17/2018 Time: SLP Start Time (ACUTE ONLY): 1145 -SLP Stop Time (ACUTE ONLY): 1214 SLP Time Calculation (min) (ACUTE ONLY): 29 min Past Medical History: Past Medical History: Diagnosis Date . ANKLE SPRAIN 12/16/2006  Qualifier: Diagnosis of  By: Tawanna Cooler MD, Eugenio Hoes  . ANXIETY 10/25/2006 . CELLULITIS, LEG, RIGHT 09/08/2007  Qualifier: Diagnosis of  By: Tawanna Cooler MD, Eugenio Hoes  . Chronic pain disorder 11/30/2015 . COPD 10/25/2006 . Diabetes mellitus type 2 with neurological manifestations (HCC) 10/25/2006  Qualifier: Diagnosis of  By: Rosette Reveal RN, Jorene Minors  . DIABETES MELLITUS, TYPE I 10/25/2006 . Diabetic polyneuropathy (HCC) 04/08/2007  Qualifier: Diagnosis of  By: Tawanna Cooler MD,  Eugenio Hoes  . 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 Essential hypertension 10/25/2006  Qualifier: Diagnosis of  By: Rosette Reveal RN, Jorene Minors  . Generalized osteoarthritis of multiple sites 11/30/2015 . HYPERTENSION 10/25/2006 . 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 Low back pain with radiation 01/23/2016 . Polyneuropathy in diabetes(357.2) 04/08/2007 . Psoriasis  . TOBACCO ABUSE 07/08/2007 . VENOUS INSUFFICIENCY 11/25/2007 Past Surgical History: Past Surgical History: Procedure Laterality Date . TESTICLE REMOVAL    R testicle HPI: 72 yo male adm to [facility] with HCAP.  PMH + for HTN, DM, chronic back pain - progressive encephalopathy over the last few weeks - wife ?s mild dementia per MD note.  Pt found to have increased troponins also and cardiology consult conducted.  Pt CT head negative.  CXR showed LLL posterior density = ? septic emboli vs multifocal pna.  ? oral thrush pt being treated= also as h/o hypertrophic tongue pallilae.  Swallow evaluation ordered.   Subjective: pt awake in chair Assessment / Plan / Recommendation CHL IP CLINICAL IMPRESSIONS 03/14/2018 Clinical Impression Pt presents with moderate oropharyngeal dysphagia with sensorimotor deficits.  Pt did have audible aspiration of thin and nectar with sequential swallows due to decreased adequacy of airway closure/protection.  Aspirates were too deep to allow clearance.  Pt also with residuals in pharynx that he does NOT sense nor clears with "hock" or liquid swallows.  Dry swallows were not able to be performed by pt = ? due to motor planning and/or cognition.  Head turn right did not decrease pharyngeal residuals that were worse with increased viscocity.  Chin tuck worsened airway protection as it spilled barium into larynx from pyriform sinus.  Also cues of counting "1,2,3" did not help pt to initiate a swallow or orally transit more efficiently.   Suspect pt has some level of chronic dysphagia/aspiration.  Mitigation  strategies reviewed with wife and pt using images from study during and immediately  post-test.  Recommend follow up at SNF for RMST to help strengthen swallow musculature.   SLP Visit Diagnosis Dysphagia, oropharyngeal phase (R13.12) Attention and concentration deficit following -- Frontal lobe and executive function deficit following -- Impact on safety and function Moderate aspiration risk   CHL IP TREATMENT RECOMMENDATION 03/14/2018 Treatment Recommendations Therapy as outlined in treatment plan below   Prognosis 03/17/2018 Prognosis for Safe Diet Advancement Fair Barriers to Reach Goals Time post onset Barriers/Prognosis Comment -- CHL IP DIET RECOMMENDATION 03/14/2018 SLP Diet Recommendations Dysphagia 2 (Fine chop) solids;Nectar thick liquid;Other (Comment) Liquid Administration via Cup;No straw;Spoon Medication Administration Whole meds with puree Compensations Minimize environmental distractions;Slow rate;Small sips/bites;Other (Comment);Effortful swallow;Follow solids with liquid Postural Changes Remain semi-upright after after feeds/meals (Comment);Seated upright at 90 degrees   CHL IP OTHER RECOMMENDATIONS 03/14/2018 Recommended Consults -- Oral Care Recommendations Oral care BID Other Recommendations Order thickener from pharmacy   CHL IP FOLLOW UP RECOMMENDATIONS 03/14/2018 Follow up Recommendations Skilled Nursing facility   [facility] IP FREQUENCY AND DURATION 03/14/2018 Speech Therapy Frequency (ACUTE ONLY) min 2x/week Treatment Duration 1 week      CHL IP ORAL PHASE 03/17/2018 Oral Phase Impaired Oral - Pudding Teaspoon -- Oral - Pudding Cup -- Oral - Honey Teaspoon -- Oral - Honey Cup -- Oral - Nectar Teaspoon Decreased bolus cohesion;Piecemeal swallowing;Weak lingual manipulation;Reduced posterior propulsion Oral - Nectar Cup Decreased bolus cohesion;Premature spillage;Piecemeal swallowing;Weak lingual manipulation;Lingual pumping Oral - Nectar Straw Decreased bolus cohesion;Weak lingual  manipulation;Premature spillage;Piecemeal swallowing Oral - Thin Teaspoon Decreased bolus cohesion;Premature spillage;Lingual pumping;Weak lingual manipulation;Piecemeal swallowing Oral - Thin Cup Decreased bolus cohesion;Lingual pumping;Premature spillage;Weak lingual manipulation;Piecemeal swallowing Oral - Thin Straw Decreased bolus cohesion;Weak lingual manipulation;Lingual pumping;Premature spillage;Piecemeal swallowing Oral - Puree Decreased bolus cohesion;Weak lingual manipulation;Lingual pumping Oral - Mech Soft Decreased bolus cohesion;Impaired mastication;Weak lingual manipulation;Lingual pumping Oral - Regular -- Oral - Multi-Consistency -- Oral - Pill -- Oral Phase - Comment counting 1,2,3 did not help pt to initiate a swallow or orally transit more efficiently   CHL IP PHARYNGEAL PHASE 03/17/2018 Pharyngeal Phase Impaired Pharyngeal- Pudding Teaspoon -- Pharyngeal -- Pharyngeal- Pudding Cup -- Pharyngeal -- Pharyngeal- Honey Teaspoon -- Pharyngeal -- Pharyngeal- Honey Cup -- Pharyngeal -- Pharyngeal- Nectar Teaspoon Reduced pharyngeal peristalsis;Reduced epiglottic inversion;Reduced laryngeal elevation;Reduced airway/laryngeal closure;Pharyngeal residue - valleculae;Pharyngeal residue - pyriform Pharyngeal -- Pharyngeal- Nectar Cup Reduced laryngeal elevation;Reduced airway/laryngeal closure;Reduced pharyngeal peristalsis;Reduced tongue base retraction;Penetration/Aspiration during swallow;Pharyngeal residue - valleculae;Pharyngeal residue - pyriform Pharyngeal Material enters airway, remains ABOVE vocal cords and not ejected out Pharyngeal- Nectar Straw Reduced epiglottic inversion;Reduced anterior laryngeal mobility;Reduced laryngeal elevation;Reduced airway/laryngeal closure;Reduced tongue base retraction;Pharyngeal residue - valleculae;Pharyngeal residue - pyriform;Penetration/Apiration after swallow Pharyngeal Material enters airway, passes BELOW cords and not ejected out despite cough attempt by  patient Pharyngeal- Thin Teaspoon Reduced tongue base retraction;Reduced airway/laryngeal closure;Reduced pharyngeal peristalsis;Reduced laryngeal elevation;Pharyngeal residue - valleculae;Pharyngeal residue - pyriform Pharyngeal -- Pharyngeal- Thin Cup Reduced laryngeal elevation;Reduced pharyngeal peristalsis;Reduced tongue base retraction;Reduced epiglottic inversion;Penetration/Aspiration during swallow;Reduced airway/laryngeal closure;Pharyngeal residue - valleculae;Pharyngeal residue - pyriform Pharyngeal Material enters airway, CONTACTS cords and not ejected out Pharyngeal- Thin Straw -- Pharyngeal -- Pharyngeal- Puree Reduced pharyngeal peristalsis;Reduced epiglottic inversion;Reduced laryngeal elevation;Reduced airway/laryngeal closure;Reduced tongue base retraction;Pharyngeal residue - valleculae Pharyngeal -- Pharyngeal- Mechanical Soft Pharyngeal residue - valleculae;Pharyngeal residue - pyriform;Reduced pharyngeal peristalsis;Reduced tongue base retraction;Reduced laryngeal elevation;Reduced airway/laryngeal closure;Reduced epiglottic inversion Pharyngeal -- Pharyngeal- Regular -- Pharyngeal -- Pharyngeal- Multi-consistency -- Pharyngeal -- Pharyngeal- Pill -- Pharyngeal -- Pharyngeal Comment head turn right did not decrease residuals, chin tuck worsened airway  protection as it spilled barium into larynx   CHL IP CERVICAL ESOPHAGEAL PHASE 03/17/2018 Cervical Esophageal Phase WFL Pudding Teaspoon -- Pudding Cup -- Honey Teaspoon -- Honey Cup -- Nectar Teaspoon -- Nectar Cup -- Nectar Straw -- Thin Teaspoon -- Thin Cup -- Thin Straw -- Puree -- Mechanical Soft -- Regular -- Multi-consistency -- Pill -- Cervical Esophageal Comment upon esophageal sweep, pt appeared with mild backflow of nectar, ? dysmotility Donavan Burnet, MS [facility] S[facility] Acute Rehab Services Pager [phone] Office [phone] Chales Abrahams 03/17/2018, 1:27 PM               Assessment/Plan:   Sepsis, due to unspecified organism,  unspecified whether acute organ dysfunction present (HCC)  Hypocalcemia - Plan: calcium carbonate (CALCIUM ANTACID) 500 MG chewable tablet, DISCONTINUED: calcium carbonate (CALCIUM ANTACID) 500 MG chewable tablet  Pruritus of skin - Plan: triamcinolone ointment (KENALOG) 0.1 %  Diabetes mellitus type 2 with neurological manifestations (HCC) - Plan: glucose blood (ONETOUCH VERIO) test strip, metFORMIN (GLUCOPHAGE) 500 MG tablet  Wound healing well on examination - Plan: vitamin C (ASCORBIC ACID) 500 MG tablet, DISCONTINUED: vitamin C (ASCORBIC ACID) 500 MG tablet  Community acquired pneumonia of right upper lobe of lung (HCC)  NSTEMI (non-ST elevated myocardial infarction) (HCC)  Elevated troponin  Acute encephalopathy  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)  Protein-calorie malnutrition, severe  Pressure injury of skin of right heel, unspecified injury stage  Hypokalemia  Hypomagnesemia  Folate deficiency  Abnormal EKG  Acute urinary retention  Anemia, unspecified type  Acute renal failure superimposed on stage 3 chronic kidney disease, unspecified acute renal failure type (HCC)  Hyperlipidemia associated with type 2 diabetes mellitus (HCC)  AKI (acute kidney injury) (HCC)  Anxiety disorder, unspecified type  Chronic obstructive pulmonary disease, unspecified COPD type (HCC)  Diabetic polyneuropathy associated with type 2 diabetes mellitus (HCC)   Patient is being discharged with the following home health services: OT/PT/nursing/aid  Patient is being discharged with the following durable medical equipment: Standard wheelchair  Patient has been advised to f/u with their PCP in 1-2 weeks to bring them up to date on their rehab stay.  Social services at facility was responsible for arranging this appointment.  Pt was provided with a 30 day supply of prescriptions for medications and refills must be obtained from their PCP.  For controlled  substances, a more limited supply may be provided adequate until PCP appointment only.  Medications have been reconciled.  Spent greater than 30 minutes;> 50% of time with patient was spent reviewing records, labs, tests and studies, counseling and developing plan of care  Jeremy Gill. [name], MD

## 2018-04-07 ENCOUNTER — Encounter: Payer: Self-pay | Admitting: Interventional Cardiology

## 2018-04-08 ENCOUNTER — Other Ambulatory Visit: Payer: Self-pay

## 2018-04-08 ENCOUNTER — Inpatient Hospital Stay (HOSPITAL_COMMUNITY)
Admission: EM | Admit: 2018-04-08 | Discharge: 2018-04-15 | DRG: 698 | Disposition: A | Discharge status: Skilled Nursing Facility | Payer: PPO | Attending: Internal Medicine | Admitting: Internal Medicine

## 2018-04-08 ENCOUNTER — Emergency Department (HOSPITAL_COMMUNITY): Payer: PPO

## 2018-04-08 ENCOUNTER — Encounter (HOSPITAL_COMMUNITY): Payer: Self-pay

## 2018-04-08 ENCOUNTER — Telehealth: Payer: Self-pay

## 2018-04-08 DIAGNOSIS — R1312 Dysphagia, oropharyngeal phase: Secondary | ICD-10-CM | POA: Diagnosis not present

## 2018-04-08 DIAGNOSIS — J9601 Acute respiratory failure with hypoxia: Secondary | ICD-10-CM | POA: Diagnosis not present

## 2018-04-08 DIAGNOSIS — I959 Hypotension, unspecified: Secondary | ICD-10-CM | POA: Diagnosis not present

## 2018-04-08 DIAGNOSIS — J440 Chronic obstructive pulmonary disease with acute lower respiratory infection: Secondary | ICD-10-CM | POA: Diagnosis present

## 2018-04-08 DIAGNOSIS — T83518A Infection and inflammatory reaction due to other urinary catheter, initial encounter: Secondary | ICD-10-CM | POA: Diagnosis not present

## 2018-04-08 DIAGNOSIS — R188 Other ascites: Secondary | ICD-10-CM | POA: Diagnosis not present

## 2018-04-08 DIAGNOSIS — R6520 Severe sepsis without septic shock: Secondary | ICD-10-CM | POA: Diagnosis present

## 2018-04-08 DIAGNOSIS — Y846 Urinary catheterization as the cause of abnormal reaction of the patient, or of later complication, without mention of misadventure at the time of the procedure: Secondary | ICD-10-CM | POA: Diagnosis present

## 2018-04-08 DIAGNOSIS — N179 Acute kidney failure, unspecified: Secondary | ICD-10-CM | POA: Diagnosis not present

## 2018-04-08 DIAGNOSIS — G9341 Metabolic encephalopathy: Secondary | ICD-10-CM | POA: Diagnosis not present

## 2018-04-08 DIAGNOSIS — Z7401 Bed confinement status: Secondary | ICD-10-CM | POA: Diagnosis not present

## 2018-04-08 DIAGNOSIS — Z79891 Long term (current) use of opiate analgesic: Secondary | ICD-10-CM

## 2018-04-08 DIAGNOSIS — I872 Venous insufficiency (chronic) (peripheral): Secondary | ICD-10-CM | POA: Diagnosis present

## 2018-04-08 DIAGNOSIS — E1142 Type 2 diabetes mellitus with diabetic polyneuropathy: Secondary | ICD-10-CM | POA: Diagnosis not present

## 2018-04-08 DIAGNOSIS — N401 Enlarged prostate with lower urinary tract symptoms: Secondary | ICD-10-CM | POA: Diagnosis present

## 2018-04-08 DIAGNOSIS — D631 Anemia in chronic kidney disease: Secondary | ICD-10-CM | POA: Diagnosis not present

## 2018-04-08 DIAGNOSIS — L89152 Pressure ulcer of sacral region, stage 2: Secondary | ICD-10-CM | POA: Diagnosis not present

## 2018-04-08 DIAGNOSIS — M549 Dorsalgia, unspecified: Secondary | ICD-10-CM | POA: Diagnosis not present

## 2018-04-08 DIAGNOSIS — M2550 Pain in unspecified joint: Secondary | ICD-10-CM | POA: Diagnosis not present

## 2018-04-08 DIAGNOSIS — E785 Hyperlipidemia, unspecified: Secondary | ICD-10-CM | POA: Diagnosis not present

## 2018-04-08 DIAGNOSIS — E1122 Type 2 diabetes mellitus with diabetic chronic kidney disease: Secondary | ICD-10-CM | POA: Diagnosis not present

## 2018-04-08 DIAGNOSIS — J189 Pneumonia, unspecified organism: Secondary | ICD-10-CM | POA: Diagnosis present

## 2018-04-08 DIAGNOSIS — R2681 Unsteadiness on feet: Secondary | ICD-10-CM | POA: Diagnosis not present

## 2018-04-08 DIAGNOSIS — I69828 Other speech and language deficits following other cerebrovascular disease: Secondary | ICD-10-CM | POA: Diagnosis not present

## 2018-04-08 DIAGNOSIS — I10 Essential (primary) hypertension: Secondary | ICD-10-CM | POA: Diagnosis present

## 2018-04-08 DIAGNOSIS — R5383 Other fatigue: Secondary | ICD-10-CM

## 2018-04-08 DIAGNOSIS — F419 Anxiety disorder, unspecified: Secondary | ICD-10-CM | POA: Diagnosis present

## 2018-04-08 DIAGNOSIS — Z515 Encounter for palliative care: Secondary | ICD-10-CM

## 2018-04-08 DIAGNOSIS — N390 Urinary tract infection, site not specified: Secondary | ICD-10-CM | POA: Diagnosis present

## 2018-04-08 DIAGNOSIS — J181 Lobar pneumonia, unspecified organism: Secondary | ICD-10-CM | POA: Diagnosis not present

## 2018-04-08 DIAGNOSIS — I214 Non-ST elevation (NSTEMI) myocardial infarction: Secondary | ICD-10-CM | POA: Diagnosis not present

## 2018-04-08 DIAGNOSIS — R410 Disorientation, unspecified: Secondary | ICD-10-CM | POA: Diagnosis not present

## 2018-04-08 DIAGNOSIS — R4182 Altered mental status, unspecified: Secondary | ICD-10-CM | POA: Diagnosis not present

## 2018-04-08 DIAGNOSIS — F17290 Nicotine dependence, other tobacco product, uncomplicated: Secondary | ICD-10-CM | POA: Diagnosis present

## 2018-04-08 DIAGNOSIS — Z7982 Long term (current) use of aspirin: Secondary | ICD-10-CM

## 2018-04-08 DIAGNOSIS — A419 Sepsis, unspecified organism: Secondary | ICD-10-CM | POA: Diagnosis not present

## 2018-04-08 DIAGNOSIS — N183 Chronic kidney disease, stage 3 (moderate): Secondary | ICD-10-CM | POA: Diagnosis present

## 2018-04-08 DIAGNOSIS — R7989 Other specified abnormal findings of blood chemistry: Secondary | ICD-10-CM | POA: Diagnosis not present

## 2018-04-08 DIAGNOSIS — D649 Anemia, unspecified: Secondary | ICD-10-CM | POA: Diagnosis not present

## 2018-04-08 DIAGNOSIS — I129 Hypertensive chronic kidney disease with stage 1 through stage 4 chronic kidney disease, or unspecified chronic kidney disease: Secondary | ICD-10-CM | POA: Diagnosis not present

## 2018-04-08 DIAGNOSIS — E11649 Type 2 diabetes mellitus with hypoglycemia without coma: Secondary | ICD-10-CM | POA: Diagnosis present

## 2018-04-08 DIAGNOSIS — Z9079 Acquired absence of other genital organ(s): Secondary | ICD-10-CM

## 2018-04-08 DIAGNOSIS — D72829 Elevated white blood cell count, unspecified: Secondary | ICD-10-CM | POA: Diagnosis not present

## 2018-04-08 DIAGNOSIS — R279 Unspecified lack of coordination: Secondary | ICD-10-CM | POA: Diagnosis not present

## 2018-04-08 DIAGNOSIS — N200 Calculus of kidney: Secondary | ICD-10-CM | POA: Diagnosis not present

## 2018-04-08 DIAGNOSIS — R531 Weakness: Secondary | ICD-10-CM | POA: Diagnosis not present

## 2018-04-08 DIAGNOSIS — Z794 Long term (current) use of insulin: Secondary | ICD-10-CM

## 2018-04-08 DIAGNOSIS — L89153 Pressure ulcer of sacral region, stage 3: Secondary | ICD-10-CM | POA: Diagnosis not present

## 2018-04-08 DIAGNOSIS — E7849 Other hyperlipidemia: Secondary | ICD-10-CM | POA: Diagnosis not present

## 2018-04-08 DIAGNOSIS — E876 Hypokalemia: Secondary | ICD-10-CM | POA: Diagnosis not present

## 2018-04-08 DIAGNOSIS — G8929 Other chronic pain: Secondary | ICD-10-CM | POA: Diagnosis not present

## 2018-04-08 DIAGNOSIS — R338 Other retention of urine: Secondary | ICD-10-CM | POA: Diagnosis not present

## 2018-04-08 DIAGNOSIS — E1149 Type 2 diabetes mellitus with other diabetic neurological complication: Secondary | ICD-10-CM | POA: Diagnosis not present

## 2018-04-08 DIAGNOSIS — E1165 Type 2 diabetes mellitus with hyperglycemia: Secondary | ICD-10-CM | POA: Diagnosis not present

## 2018-04-08 DIAGNOSIS — D529 Folate deficiency anemia, unspecified: Secondary | ICD-10-CM | POA: Diagnosis not present

## 2018-04-08 DIAGNOSIS — L89150 Pressure ulcer of sacral region, unstageable: Secondary | ICD-10-CM | POA: Diagnosis not present

## 2018-04-08 DIAGNOSIS — Z79899 Other long term (current) drug therapy: Secondary | ICD-10-CM

## 2018-04-08 DIAGNOSIS — F0390 Unspecified dementia without behavioral disturbance: Secondary | ICD-10-CM | POA: Diagnosis not present

## 2018-04-08 DIAGNOSIS — R4020 Unspecified coma: Secondary | ICD-10-CM | POA: Diagnosis not present

## 2018-04-08 DIAGNOSIS — M6281 Muscle weakness (generalized): Secondary | ICD-10-CM | POA: Diagnosis not present

## 2018-04-08 DIAGNOSIS — Z7951 Long term (current) use of inhaled steroids: Secondary | ICD-10-CM

## 2018-04-08 DIAGNOSIS — J449 Chronic obstructive pulmonary disease, unspecified: Secondary | ICD-10-CM | POA: Diagnosis not present

## 2018-04-08 DIAGNOSIS — Z833 Family history of diabetes mellitus: Secondary | ICD-10-CM

## 2018-04-08 DIAGNOSIS — E43 Unspecified severe protein-calorie malnutrition: Secondary | ICD-10-CM | POA: Diagnosis not present

## 2018-04-08 DIAGNOSIS — Z9181 History of falling: Secondary | ICD-10-CM | POA: Diagnosis not present

## 2018-04-08 DIAGNOSIS — F0150 Vascular dementia without behavioral disturbance: Secondary | ICD-10-CM | POA: Diagnosis present

## 2018-04-08 DIAGNOSIS — R404 Transient alteration of awareness: Secondary | ICD-10-CM | POA: Diagnosis not present

## 2018-04-08 LAB — URINALYSIS, ROUTINE W REFLEX MICROSCOPIC
BACTERIA UA: NONE SEEN
BILIRUBIN URINE: NEGATIVE
Bilirubin Urine: NEGATIVE
GLUCOSE, UA: 50 mg/dL — AB
GLUCOSE, UA: 50 mg/dL — AB
KETONES UR: NEGATIVE mg/dL
KETONES UR: NEGATIVE mg/dL
NITRITE: NEGATIVE
Nitrite: NEGATIVE
PROTEIN: 100 mg/dL — AB
Protein, ur: 100 mg/dL — AB
Specific Gravity, Urine: 1.009 (ref 1.005–1.030)
Specific Gravity, Urine: 1.01 (ref 1.005–1.030)
WBC, UA: >50 WBC/hpf — ABNORMAL HIGH (ref 0–5)
pH: 6 (ref 5.0–8.0)
pH: 6 (ref 5.0–8.0)

## 2018-04-08 LAB — BASIC METABOLIC PANEL
Anion gap: 8 (ref 5–15)
BUN: 32 mg/dL — AB (ref 8–23)
CO2: 24 mmol/L (ref 22–32)
Calcium: 7.7 mg/dL — ABNORMAL LOW (ref 8.9–10.3)
Chloride: 107 mmol/L (ref 98–111)
Creatinine, Ser: 1.5 mg/dL — ABNORMAL HIGH (ref 0.61–1.24)
GFR calc Af Amer: 52 mL/min — ABNORMAL LOW (ref >60)
GFR, EST NON AFRICAN AMERICAN: 45 mL/min — AB (ref >60)
GLUCOSE: 223 mg/dL — AB (ref 70–99)
Potassium: 4.1 mmol/L (ref 3.5–5.1)
Sodium: 139 mmol/L (ref 135–145)

## 2018-04-08 LAB — GLUCOSE, CAPILLARY: Glucose-Capillary: 140 mg/dL — ABNORMAL HIGH (ref 70–99)

## 2018-04-08 LAB — CBC WITH DIFFERENTIAL/PLATELET
ABS IMMATURE GRANULOCYTES: 0.15 10*3/uL — AB (ref 0.00–0.07)
Basophils Absolute: 0 10*3/uL (ref 0.0–0.1)
Basophils Relative: 0 %
Eosinophils Absolute: 0.2 10*3/uL (ref 0.0–0.5)
Eosinophils Relative: 1 %
HCT: 30.4 % — ABNORMAL LOW (ref 39.0–52.0)
HEMOGLOBIN: 9.6 g/dL — AB (ref 13.0–17.0)
Immature Granulocytes: 1 %
Lymphocytes Relative: 10 %
Lymphs Abs: 1.7 10*3/uL (ref 0.7–4.0)
MCH: 29.9 pg (ref 26.0–34.0)
MCHC: 31.6 g/dL (ref 30.0–36.0)
MCV: 94.7 fL (ref 80.0–100.0)
MONO ABS: 0.9 10*3/uL (ref 0.1–1.0)
MONOS PCT: 5 %
Neutro Abs: 14.7 10*3/uL — ABNORMAL HIGH (ref 1.7–7.7)
Neutrophils Relative %: 83 %
PLATELETS: 202 10*3/uL (ref 150–400)
RBC: 3.21 MIL/uL — ABNORMAL LOW (ref 4.22–5.81)
RDW: 13.1 % (ref 11.5–15.5)
WBC: 17.6 10*3/uL — ABNORMAL HIGH (ref 4.0–10.5)
nRBC: 0 % (ref 0.0–0.2)

## 2018-04-08 LAB — AMMONIA: Ammonia: 15 umol/L (ref 9–35)

## 2018-04-08 LAB — CK: Total CK: 48 U/L — ABNORMAL LOW (ref 49–397)

## 2018-04-08 MED ORDER — VITAMIN B-12 1000 MCG PO TABS
1000.0000 ug | ORAL_TABLET | Freq: Every day | ORAL | Status: DC
  Administered 2018-04-10 – 2018-04-15 (×6): 1000 ug via ORAL
  Filled 2018-04-08 (×6): qty 1

## 2018-04-08 MED ORDER — IPRATROPIUM-ALBUTEROL 0.5-2.5 (3) MG/3ML IN SOLN: 3.0000 mL | Freq: Four times a day (QID) | RESPIRATORY_TRACT | Status: DC | PRN

## 2018-04-08 MED ORDER — DULOXETINE HCL 60 MG PO CPEP
60.0000 mg | ORAL_CAPSULE | Freq: Every day | ORAL | Status: DC
  Administered 2018-04-10 – 2018-04-13 (×3): 60 mg via ORAL
  Filled 2018-04-08 (×4): qty 1

## 2018-04-08 MED ORDER — SODIUM CHLORIDE 0.9 % IV SOLN
1.0000 g | INTRAVENOUS | Status: DC
  Filled 2018-04-08: qty 10

## 2018-04-08 MED ORDER — INSULIN GLARGINE 100 UNIT/ML ~~LOC~~ SOLN
15.0000 [IU] | Freq: Every day | SUBCUTANEOUS | Status: DC
  Administered 2018-04-08: 15 [IU] via SUBCUTANEOUS
  Filled 2018-04-08: qty 0.15

## 2018-04-08 MED ORDER — ASPIRIN EC 81 MG PO TBEC
81.0000 mg | DELAYED_RELEASE_TABLET | Freq: Every day | ORAL | Status: DC
  Administered 2018-04-10 – 2018-04-15 (×6): 81 mg via ORAL
  Filled 2018-04-08 (×6): qty 1

## 2018-04-08 MED ORDER — ATORVASTATIN CALCIUM 40 MG PO TABS
40.0000 mg | ORAL_TABLET | Freq: Every day | ORAL | Status: DC
  Administered 2018-04-10 – 2018-04-15 (×6): 40 mg via ORAL
  Filled 2018-04-08 (×6): qty 1

## 2018-04-08 MED ORDER — SODIUM CHLORIDE 0.9 % IV SOLN
2.0000 g | Freq: Once | INTRAVENOUS | Status: AC
  Administered 2018-04-08: 2 g via INTRAVENOUS
  Filled 2018-04-08: qty 20

## 2018-04-08 MED ORDER — MOMETASONE FURO-FORMOTEROL FUM 200-5 MCG/ACT IN AERO
2.0000 | INHALATION_SPRAY | Freq: Two times a day (BID) | RESPIRATORY_TRACT | Status: DC
  Administered 2018-04-10 – 2018-04-15 (×9): 2 via RESPIRATORY_TRACT
  Filled 2018-04-08: qty 8.8

## 2018-04-08 MED ORDER — SODIUM CHLORIDE 0.9 % IV SOLN: INTRAVENOUS | Status: DC

## 2018-04-08 MED ORDER — SODIUM CHLORIDE 0.9% FLUSH
3.0000 mL | Freq: Two times a day (BID) | INTRAVENOUS | Status: DC
  Administered 2018-04-08 – 2018-04-15 (×6): 3 mL via INTRAVENOUS

## 2018-04-08 MED ORDER — LACTATED RINGERS IV SOLN
INTRAVENOUS | Status: AC
  Administered 2018-04-08: 23:00:00 via INTRAVENOUS

## 2018-04-08 MED ORDER — NALOXONE HCL 0.4 MG/ML IJ SOLN
0.4000 mg | Freq: Once | INTRAMUSCULAR | Status: AC
  Administered 2018-04-08: 0.4 mg via INTRAVENOUS
  Filled 2018-04-08: qty 1

## 2018-04-08 MED ORDER — SODIUM CHLORIDE 0.9 % IV BOLUS
500.0000 mL | Freq: Once | INTRAVENOUS | Status: AC
  Administered 2018-04-08: 500 mL via INTRAVENOUS

## 2018-04-08 MED ORDER — METOPROLOL TARTRATE 25 MG PO TABS: 25.0000 mg | ORAL_TABLET | Freq: Every day | ORAL | Status: DC

## 2018-04-08 MED ORDER — HEPARIN SODIUM (PORCINE) 5000 UNIT/ML IJ SOLN
5000.0000 [IU] | Freq: Three times a day (TID) | INTRAMUSCULAR | Status: DC
  Administered 2018-04-09 – 2018-04-15 (×21): 5000 [IU] via SUBCUTANEOUS
  Filled 2018-04-08 (×21): qty 1

## 2018-04-08 MED ORDER — INSULIN ASPART 100 UNIT/ML ~~LOC~~ SOLN
0.0000 [IU] | Freq: Four times a day (QID) | SUBCUTANEOUS | Status: DC
  Administered 2018-04-08: 1 [IU] via SUBCUTANEOUS

## 2018-04-08 NOTE — ED Notes (Signed)
Bed: WA02 Expected date: 04/08/18 Expected time: 2:44 PM Means of arrival: Ambulance Comments: AMS foley present

## 2018-04-08 NOTE — ED Notes (Signed)
ED TO INPATIENT HANDOFF REPORT  Name/Age/Gender Jeremy Gill 72 y.o. male  Code Status    Code Status Orders  (From admission, onward)         Start     Ordered   04/08/18 2059  Full code  Continuous     04/08/18 2058        Code Status History    Date Active Date Inactive Code Status Order ID Comments User Context   03/13/2018 1537 03/18/2018 1944 Full Code [id number]  Caren Griffins, MD ED   01/14/2018 0009 01/17/2018 1935 Full Code [id number]  Toy Baker, MD Inpatient   10/01/2017 0016 10/04/2017 1808 Full Code [id number]  Ivor Costa, MD ED   12/18/2016 1924 12/20/2016 1635 Full Code [id number]  Jani Gravel, MD Inpatient      Home/SNF/Other Home  Chief Complaint Altered Mental  Level of Care/Admitting Diagnosis ED Disposition    ED Disposition Condition [place]: [facility] [109323]  Level of Care: Med-Surg [16]  Diagnosis: Encephalopathy acute [557322]  Admitting Physician: Vilma Prader [[id number]]  Attending Physician: Vilma Prader [[id number]]  PT Class (Do Not Modify): Observation [104]  PT Acc Code (Do Not Modify): Observation [10022]       Medical History Past Medical History:  Diagnosis Date  . ANKLE SPRAIN 12/16/2006   Qualifier: Diagnosis of  By: Sherren Mocha MD, Jory Ee   . ANXIETY 10/25/2006  . CELLULITIS, LEG, RIGHT 09/08/2007   Qualifier: Diagnosis of  By: Sherren Mocha MD, Jory Ee   . Chronic pain disorder 11/30/2015  . COPD 10/25/2006  . 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 DIABETES MELLITUS, TYPE I 10/25/2006  . Diabetic polyneuropathy ([place]) 04/08/2007   Qualifier: Diagnosis of  By: Sherren Mocha MD, Jory Ee   . 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 Essential hypertension 10/25/2006   Qualifier: Diagnosis of  By: Cari Caraway RN, Ventura Sellers   . Generalized osteoarthritis of multiple sites 11/30/2015  . HYPERTENSION 10/25/2006   . 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 Low back pain with radiation 01/23/2016  . Polyneuropathy in diabetes(357.2) 04/08/2007  . Psoriasis   . TOBACCO ABUSE 07/08/2007  . VENOUS INSUFFICIENCY 11/25/2007    Allergies No Known Allergies  IV Location/Drains/Wounds Patient Lines/Drains/Airways Status   Active Line/Drains/Airways    Name:   Placement date:   Placement time:   Site:   Days:   Peripheral IV 04/08/18 Left Forearm   04/08/18    1458    Forearm   less than 1   Urethral Catheter D Brown, RN Non-latex;Straight-tip 16 Fr.   03/18/18    0525    Non-latex;Straight-tip   21   Urethral Catheter M. Bowen RN Straight-tip 16 Fr.   04/08/18    1802    Straight-tip   less than 1          Labs/Imaging Results for orders placed or performed during the hospital encounter of 04/08/18 (from the past 48 hour(s))  Basic metabolic panel     Status: Abnormal   Collection Time: 04/08/18  4:04 PM  Result Value Ref Range   Sodium 139 135 - 145 mmol/L   Potassium 4.1 3.5 - 5.1 mmol/L   Chloride 107 98 - 111 mmol/L   CO2 24 22 - 32 mmol/L   Glucose,  Bld 223 (H) 70 - 99 mg/dL   BUN 32 (H) 8 - 23 mg/dL   Creatinine, Ser 1.50 (H) 0.61 - 1.24 mg/dL   Calcium 7.7 (L) 8.9 - 10.3 mg/dL   GFR calc non Af Amer 45 (L) >60 mL/min   GFR calc Af Amer 52 (L) >60 mL/min    Comment: (NOTE) The eGFR has been calculated using the CKD EPI equation. This calculation has not been validated in all clinical situations. eGFR's persistently <60 mL/min signify possible Chronic Kidney Disease.    Anion gap 8 5 - 15    Comment: Performed at [facility], [place] [address] [place], [place] [address]  CBC with Differential     Status: Abnormal   Collection Time: 04/08/18  4:04 PM  Result Value Ref Range   WBC 17.6 (H) 4.0 - 10.5 K/uL   RBC 3.21 (L) 4.22 - 5.81 MIL/uL   Hemoglobin 9.6 (L) 13.0 - 17.0 g/dL   HCT 30.4 (L) 39.0 - 52.0 %    MCV 94.7 80.0 - 100.0 fL   MCH 29.9 26.0 - 34.0 pg   MCHC 31.6 30.0 - 36.0 g/dL   RDW 13.1 11.5 - 15.5 %   Platelets 202 150 - 400 K/uL   nRBC 0.0 0.0 - 0.2 %   Neutrophils Relative % 83 %   Neutro Abs 14.7 (H) 1.7 - 7.7 K/uL   Lymphocytes Relative 10 %   Lymphs Abs 1.7 0.7 - 4.0 K/uL   Monocytes Relative 5 %   Monocytes Absolute 0.9 0.1 - 1.0 K/uL   Eosinophils Relative 1 %   Eosinophils Absolute 0.2 0.0 - 0.5 K/uL   Basophils Relative 0 %   Basophils Absolute 0.0 0.0 - 0.1 K/uL   Immature Granulocytes 1 %   Abs Immature Granulocytes 0.15 (H) 0.00 - 0.07 K/uL    Comment: Performed at [facility], [place] [address], [place], [place] [address]  CK     Status: Abnormal   Collection Time: 04/08/18  4:04 PM  Result Value Ref Range   Total CK 48 (L) 49 - 397 U/L    Comment: Performed at [facility], [place] [address], [place], [place] [address]  Ammonia     Status: None   Collection Time: 04/08/18  4:04 PM  Result Value Ref Range   Ammonia 15 9 - 35 umol/L    Comment: Performed at [facility], [place] [address] [place], [place] [address]  Urinalysis, Routine w reflex microscopic     Status: Abnormal   Collection Time: 04/08/18  4:38 PM  Result Value Ref Range   Color, Urine YELLOW YELLOW   APPearance TURBID (A) CLEAR   Specific Gravity, Urine 1.009 1.005 - 1.030   pH 6.0 5.0 - 8.0   Glucose, UA 50 (A) NEGATIVE mg/dL   Hgb urine dipstick MODERATE (A) NEGATIVE   Bilirubin Urine NEGATIVE NEGATIVE   Ketones, ur NEGATIVE NEGATIVE mg/dL   Protein, ur 100 (A) NEGATIVE mg/dL   Nitrite NEGATIVE NEGATIVE   Leukocytes, UA LARGE (A) NEGATIVE   RBC / HPF >50 (H) 0 - 5 RBC/hpf   WBC, UA >50 (H) 0 - 5 WBC/hpf   Bacteria, UA NONE SEEN NONE SEEN   WBC Clumps PRESENT     Comment: Performed at [facility], [place] [address], [place], [place] [address]  Urinalysis, Routine w reflex microscopic     Status: Abnormal    Collection Time:  04/08/18  6:02 PM  Result Value Ref Range   Color, Urine YELLOW YELLOW   APPearance CLOUDY (A) CLEAR   Specific Gravity, Urine 1.010 1.005 - 1.030   pH 6.0 5.0 - 8.0   Glucose, UA 50 (A) NEGATIVE mg/dL   Hgb urine dipstick LARGE (A) NEGATIVE   Bilirubin Urine NEGATIVE NEGATIVE   Ketones, ur NEGATIVE NEGATIVE mg/dL   Protein, ur 100 (A) NEGATIVE mg/dL   Nitrite NEGATIVE NEGATIVE   Leukocytes, UA LARGE (A) NEGATIVE   RBC / HPF >50 (H) 0 - 5 RBC/hpf   WBC, UA >50 (H) 0 - 5 WBC/hpf   Bacteria, UA RARE (A) NONE SEEN   Squamous Epithelial / LPF 0-5 0 - 5   WBC Clumps PRESENT    Non Squamous Epithelial 0-5 (A) NONE SEEN    Comment: Performed at [facility], [place] [address] [place], [place] [address]   Dg Chest Port 1 View  Result Date: 04/08/2018 CLINICAL DATA:  Confusion beyond normal. Possible urinary tract infection. EXAM: PORTABLE CHEST 1 VIEW COMPARISON:  03/13/2018 FINDINGS: Stable heart size and mediastinal contours allowing for patient rotation. Nonaneurysmal thoracic aorta. Interval improvement and partial clearing of left-sided lower lobe pneumonia since prior. Small retrocardiac infiltrate or atelectasis medially within the right lung base is suggested on current exam. No acute osseous abnormality. IMPRESSION: 1. Interval improvement in left lower lobe pneumonia. 2. Small retrocardiac infiltrate or atelectasis medially within the right lung base is suggested on current exam. Electronically Signed   By: Ashley Royalty M.D.   On: 04/08/2018 16:59    Pending Labs Unresulted Labs (From admission, onward)    Start     Ordered   04/09/18 8466  Basic metabolic panel  Tomorrow morning,   R     04/08/18 2058   04/09/18 0500  CBC  Tomorrow morning,   R     04/08/18 2058   04/08/18 2059  C difficile quick scan w PCR reflex  (C Difficile quick screen w PCR reflex panel)  Once, for 24 hours,   R     04/08/18 2058   04/08/18 2059  Culture, Urine  Add-on,    R     04/08/18 2058   04/08/18 2059  Culture, blood (routine x 2)  BLOOD CULTURE X 2,   R     04/08/18 2058   04/08/18 1858  Urine culture  ONCE - STAT,   STAT     04/08/18 1857          Vitals/Pain Today's Vitals   04/08/18 1756 04/08/18 1802 04/08/18 1934 04/08/18 1934  BP:  (!) 150/82  112/65  Pulse:  (!) 101  99  Resp:  16  16  Temp: 97.9 F (36.6 C)     TempSrc: Rectal     SpO2:  100%  99%  PainSc:   Asleep     Isolation Precautions Enteric precautions (UV disinfection)  Medications Medications  mometasone-formoterol (DULERA) 200-5 MCG/ACT inhaler 2 puff (2 puffs Inhalation Not Given 04/08/18 2100)  aspirin EC tablet 81 mg (has no administration in time range)  atorvastatin (LIPITOR) tablet 40 mg (has no administration in time range)  metoprolol tartrate (LOPRESSOR) tablet 25 mg (has no administration in time range)  DULoxetine (CYMBALTA) DR capsule 60 mg (has no administration in time range)  vitamin B-12 (CYANOCOBALAMIN) tablet 1,000 mcg (has no administration in time range)  ipratropium-albuterol (DUONEB) 0.5-2.5 (3) MG/3ML nebulizer solution 3 mL (has no administration in  time range)  heparin injection 5,000 Units (has no administration in time range)  sodium chloride flush (NS) 0.9 % injection 3 mL (has no administration in time range)  cefTRIAXone (ROCEPHIN) 1 g in sodium chloride 0.9 % 100 mL IVPB (has no administration in time range)  lactated ringers infusion (has no administration in time range)  insulin glargine (LANTUS) injection 15 Units (has no administration in time range)  insulin aspart (novoLOG) injection 0-9 Units (has no administration in time range)  sodium chloride 0.9 % bolus 500 mL (0 mLs Intravenous Stopped 04/08/18 1638)  naloxone (NARCAN) injection 0.4 mg (0.4 mg Intravenous Given 04/08/18 1607)  cefTRIAXone (ROCEPHIN) 2 g in sodium chloride 0.9 % 100 mL IVPB (0 g Intravenous Stopped 04/08/18 1858)    Mobility walks

## 2018-04-08 NOTE — ED Provider Notes (Signed)
[facility]-EMERGENCY DEPT Provider Note   CSN: [id number] Arrival date & time: 04/08/18  1447     History   Chief Complaint Chief Complaint  Patient presents with  . Altered Mental Status    HPI Jeremy Gill is a 72 y.o. male.  History from wife.  HPI   The patient was noticed by his wife to be lethargic today.  Yesterday he was alert and communicative able to ambulate.  Today she was able to get him from the bathroom to the kitchen table, where he sat down and then began to slump in his chair.  She did not eat or take his medications today.  He was discharged from rehab to home, 3 days ago after hospitalization for pneumonia.  He is receiving ongoing management for back pain.  He has a chronic Foley catheter that has been in place for at least 4 weeks.  His wife schedule appointment to see urology tomorrow to see about getting the Foley out and having a voiding trial.  There is been no fever.  Since going home he increased his daily tramadol dosing from 2 to 4 pills each day.  He is taking baclofen for spasms.  He was eating well until yesterday but has not eaten today.  His wife is concerned that there is something wrong with his kidneys, and that he may be taking too many medications.  There are no other known modifying factors.  Past Medical History:  Diagnosis Date  . ANKLE SPRAIN 12/16/2006   Qualifier: Diagnosis of  By: Tawanna Cooler MD, Eugenio Hoes   . ANXIETY 10/25/2006  . CELLULITIS, LEG, RIGHT 09/08/2007   Qualifier: Diagnosis of  By: Tawanna Cooler MD, Eugenio Hoes   . Chronic pain disorder 11/30/2015  . COPD 10/25/2006  . 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 DIABETES MELLITUS, TYPE I 10/25/2006  . Diabetic polyneuropathy (HCC) 04/08/2007   Qualifier: Diagnosis of  By: Tawanna Cooler MD, Eugenio Hoes   . 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 Essential hypertension 10/25/2006   Qualifier: Diagnosis  of  By: Rosette Reveal RN, Jorene Minors   . Generalized osteoarthritis of multiple sites 11/30/2015  . HYPERTENSION 10/25/2006  . 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 Low back pain with radiation 01/23/2016  . Polyneuropathy in diabetes(357.2) 04/08/2007  . Psoriasis   . TOBACCO ABUSE 07/08/2007  . VENOUS INSUFFICIENCY 11/25/2007    Patient Active Problem List   Diagnosis Date Noted  . Community acquired pneumonia 04/03/2018  . NSTEMI (non-ST elevated myocardial infarction) (HCC) 03/25/2018  . Multi-infarct dementia (HCC) 03/25/2018  . Hypomagnesemia 03/25/2018  . Folate deficiency 03/16/2018  . Elevated troponin 03/14/2018  . Abnormal EKG 03/14/2018  . Chest pain 03/14/2018  . Oral candidiasis 03/14/2018  . Dysphagia 03/14/2018  . Malnutrition of moderate degree 03/14/2018  . HCAP (healthcare-associated pneumonia) 03/13/2018  . Abnormality of gait 02/27/2018  . Bilateral lower extremity edema 02/05/2018  . Pruritus of skin 02/05/2018  . Protein-calorie malnutrition, severe 01/16/2018  . Acute urinary retention 01/13/2018  . Acute encephalopathy 01/13/2018  . Obstipation 01/13/2018  . Chronic pain syndrome 11/01/2017  . Pressure injury of skin 10/02/2017  . Anemia   . Depression 09/30/2017  . Acute renal failure superimposed on stage 3 chronic kidney disease (HCC) 09/30/2017  . Sepsis (HCC) 09/30/2017  . Hypokalemia 09/30/2017  .  Hyperlipidemia associated with type 2 diabetes mellitus (HCC) 02/05/2017  . AKI (acute kidney injury) (HCC) 12/19/2016  . Acute metabolic encephalopathy 12/19/2016  . DKA (diabetic ketoacidoses) (HCC) 12/18/2016  . Hyperkalemia 12/18/2016  . Anxiety disorder, unspecified 02/08/2016  . Low back pain with radiation 01/23/2016  . Chronic pain disorder 11/30/2015  . Generalized osteoarthritis of multiple sites 11/30/2015  . DYSHIDROSIS 06/08/2008  . VENOUS INSUFFICIENCY 11/25/2007  . HYPERTROPHY OF  TONGUE PAPILLAE 11/11/2007  . TOBACCO ABUSE 07/08/2007  . Diabetic polyneuropathy (HCC) 04/08/2007  . 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 Hypertension associated with diabetes (HCC) 10/25/2006  . COPD (chronic obstructive pulmonary disease) (HCC) 10/25/2006    Past Surgical History:  Procedure Laterality Date  . TESTICLE REMOVAL     R testicle        Home Medications    Prior to Admission medications   Medication Sig Start Date End Date Taking? Authorizing Provider  aspirin EC 81 MG tablet Take 1 tablet (81 mg total) by mouth daily. 04/03/18  Yes Margit Hanks, MD  atorvastatin (LIPITOR) 40 MG tablet Take 1 tablet (40 mg total) by mouth daily. 04/03/18 07/02/18 Yes Margit Hanks, MD  baclofen (LIORESAL) 10 MG tablet Take 1 tablet (10 mg total) by mouth 3 (three) times daily. 04/03/18  Yes Margit Hanks, MD  diclofenac (VOLTAREN) 50 MG EC tablet Take 50 mg by mouth 3 (three) times daily.   Yes [provider]  DULoxetine (CYMBALTA) 60 MG capsule TAKE 1 CAPSULE BY MOUTH EVERY DAY Patient taking differently: Take 60 mg by mouth daily.  04/03/18  Yes Margit Hanks, MD  gabapentin (NEURONTIN) 600 MG tablet Take 1 tablet (600 mg total) by mouth 3 (three) times daily. 04/03/18  Yes Margit Hanks, MD  Insulin Glargine (LANTUS SOLOSTAR) 100 UNIT/ML Solostar Pen Inject 28 Units into the skin at bedtime. Patient taking differently: Inject 20 Units into the skin at bedtime.  04/03/18  Yes Margit Hanks, MD  ipratropium-albuterol (DUONEB) 0.5-2.5 (3) MG/3ML SOLN Take 3 mLs by nebulization every 6 (six) hours as needed. Patient taking differently: Take 3 mLs by nebulization every 6 (six) hours as needed (wheezing).  04/03/18  Yes Margit Hanks, MD  metFORMIN (GLUCOPHAGE) 500 MG tablet Take 2 tablets (1,000 mg total) by mouth 2 (two) times daily with a meal. 04/03/18  Yes Margit Hanks, MD  metoprolol tartrate (LOPRESSOR) 50  MG tablet Take 1 tablet (50 mg total) by mouth 2 (two) times daily. Patient taking differently: Take 25 mg by mouth daily.  04/03/18  Yes Margit Hanks, MD  NONFORMULARY OR COMPOUNDED ITEM Apply 1 application topically 2 (two) times daily as needed (rash). 1:1 mixture of eucerin and triamcinolone 0.1% ointment   Yes [provider]  polyethylene glycol (MIRALAX / GLYCOLAX) packet Take 17 g by mouth daily. Patient taking differently: Take 17 g by mouth daily as needed for mild constipation.  04/03/18  Yes Margit Hanks, MD  traMADol (ULTRAM) 50 MG tablet Take 50 mg by mouth every 6 (six) hours as needed for moderate pain.   Yes [provider]  vitamin B-12 (CYANOCOBALAMIN) 1000 MCG tablet Take 1,000 mcg by mouth daily.   Yes [provider]  ACCU-CHEK SOFTCLIX LANCETS lancets Use as instructed 01/08/18   [place], [name], MD  budesonide-formoterol [facility]) 160-4.5 MCG/ACT inhaler Inhale 2 puffs into the lungs 2 (two) times daily. Patient not taking: Reported on 04/08/2018  04/03/18   Margit Hanks, MD  calcium carbonate (CALCIUM ANTACID) 500 MG chewable tablet Chew 1 tablet (200 mg of elemental calcium total) by mouth 2 (two) times daily. Patient not taking: Reported on 04/08/2018 04/03/18   Margit Hanks, MD  folic acid (FOLVITE) 1 MG tablet Take 1 tablet (1 mg total) by mouth daily. Patient not taking: Reported on 04/08/2018 04/03/18   Margit Hanks, MD  glucose blood [facility] VERIO) test strip Use to test blood sugar three-four times daily. 04/03/18   Margit Hanks, MD  insulin aspart (NOVOLOG) 100 UNIT/ML injection Inject 6 Units into the skin 3 (three) times daily with meals. Patient not taking: Reported on 04/08/2018 03/18/18   Rodolph Bong, MD  Insulin Syringe-Needle U-100 (INSULIN SYRINGE .5CC/31GX5/16") 31G X 5/16" 0.5 ML MISC USE AS DIRECTED WITH LANTUS 04/03/18   Margit Hanks, MD  isosorbide mononitrate (IMDUR) 30 MG 24 hr tablet  Take 1 tablet (30 mg total) by mouth daily. Patient not taking: Reported on 04/08/2018 04/03/18   Margit Hanks, MD  loratadine (CLARITIN) 10 MG tablet Take 1 tablet (10 mg total) by mouth daily. Patient not taking: Reported on 04/08/2018 04/03/18   Margit Hanks, MD  Multiple Vitamin (MULTIVITAMIN WITH MINERALS) TABS tablet Take 1 tablet by mouth daily. Patient not taking: Reported on 04/08/2018 04/03/18   Margit Hanks, MD  Potassium Chloride ER 20 MEQ TBCR Take 20 mEq by mouth daily. Patient not taking: Reported on 04/08/2018 04/03/18   Margit Hanks, MD  tamsulosin (FLOMAX) 0.4 MG CAPS capsule Take 1 capsule (0.4 mg total) by mouth daily after breakfast. NEED OV. Patient not taking: Reported on 04/08/2018 04/03/18   Margit Hanks, MD  triamcinolone ointment (KENALOG) 0.1 % Apply 1 application topically 2 (two) times daily as needed (rash). Compounded as a 1:1 mixture with Eucerin. Patient not taking: Reported on 04/08/2018 04/03/18   Margit Hanks, MD  vitamin C (ASCORBIC ACID) 500 MG tablet Take 1 tablet (500 mg total) by mouth 2 (two) times daily. Patient not taking: Reported on 04/08/2018 04/03/18   Margit Hanks, MD    Family History Family History  Problem Relation Age of Onset  . Cancer Mother        Unknown   . Diabetes Sister     Social History Social History   Tobacco Use  . Smoking status: Current Every Day Smoker    Types: Pipe    Last attempt to quit: 05/29/2011    Years since quitting: 6.8  . Smokeless tobacco: Never Used  Substance Use Topics  . Alcohol use: No  . Drug use: No     Allergies   Patient has no known allergies.   Review of Systems Review of Systems  All other systems reviewed and are negative.    Physical Exam Updated Vital Signs BP 112/65 (BP Location: Right Arm)   Pulse 99   Temp 97.9 F (36.6 C) (Rectal)   Resp 16   SpO2 99%   Physical Exam  Constitutional: He appears well-developed.  Elderly, frail    HENT:  Head: Normocephalic and atraumatic.  Right Ear: External ear normal.  Left Ear: External ear normal.  Mouth open oral mucous membranes very dry.  Small amount of mucus on the soft palate.  No other oral lesions.  Eyes: Pupils are equal, round, and reactive to light. Conjunctivae and EOM are normal.  Neck: Normal range of motion and phonation normal. Neck  supple.  Cardiovascular: Normal rate, regular rhythm and normal heart sounds.  Pulmonary/Chest: Effort normal and breath sounds normal. No stridor. No respiratory distress. He exhibits no bony tenderness.  Abdominal: Soft. There is no tenderness.  Musculoskeletal: He exhibits no edema, tenderness or deformity.  Neurological:  Patient is obtunded, does not respond to sternal rub, snoring loudly.  Skin: Skin is warm, dry and intact.  Psychiatric:  Obtunded  Nursing note and vitals reviewed.    ED Treatments / Results  Labs (all labs ordered are listed, but only abnormal results are displayed) Labs Reviewed  URINALYSIS, ROUTINE W REFLEX MICROSCOPIC - Abnormal; Notable for the following components:      Result Value   APPearance TURBID (*)    Glucose, UA 50 (*)    Hgb urine dipstick MODERATE (*)    Protein, ur 100 (*)    Leukocytes, UA LARGE (*)    RBC / HPF >50 (*)    WBC, UA >50 (*)    All other components within normal limits  BASIC METABOLIC PANEL - Abnormal; Notable for the following components:   Glucose, Bld 223 (*)    BUN 32 (*)    Creatinine, Ser 1.50 (*)    Calcium 7.7 (*)    GFR calc non Af Amer 45 (*)    GFR calc Af Amer 52 (*)    All other components within normal limits  CBC WITH DIFFERENTIAL/PLATELET - Abnormal; Notable for the following components:   WBC 17.6 (*)    RBC 3.21 (*)    Hemoglobin 9.6 (*)    HCT 30.4 (*)    Neutro Abs 14.7 (*)    Abs Immature Granulocytes 0.15 (*)    All other components within normal limits  CK - Abnormal; Notable for the following components:   Total CK 48 (*)     All other components within normal limits  URINALYSIS, ROUTINE W REFLEX MICROSCOPIC - Abnormal; Notable for the following components:   APPearance CLOUDY (*)    Glucose, UA 50 (*)    Hgb urine dipstick LARGE (*)    Protein, ur 100 (*)    Leukocytes, UA LARGE (*)    RBC / HPF >50 (*)    WBC, UA >50 (*)    Bacteria, UA RARE (*)    Non Squamous Epithelial 0-5 (*)    All other components within normal limits  URINE CULTURE  AMMONIA    EKG None  Radiology Dg Chest Port 1 View  Result Date: 04/08/2018 CLINICAL DATA:  Confusion beyond normal. Possible urinary tract infection. EXAM: PORTABLE CHEST 1 VIEW COMPARISON:  03/13/2018 FINDINGS: Stable heart size and mediastinal contours allowing for patient rotation. Nonaneurysmal thoracic aorta. Interval improvement and partial clearing of left-sided lower lobe pneumonia since prior. Small retrocardiac infiltrate or atelectasis medially within the right lung base is suggested on current exam. No acute osseous abnormality. IMPRESSION: 1. Interval improvement in left lower lobe pneumonia. 2. Small retrocardiac infiltrate or atelectasis medially within the right lung base is suggested on current exam. Electronically Signed   By: Tollie Eth M.D.   On: 04/08/2018 16:59    Procedures .Critical Care Performed by: Mancel Bale, MD Authorized by: Mancel Bale, MD   Critical care provider statement:    Critical care time (minutes):  35   Critical care start time:  04/08/2018 3:08 PM   Critical care end time:  04/08/2018 6:52 PM   Critical care time was exclusive of:  Separately billable procedures and treating  other patients   Critical care was necessary to treat or prevent imminent or life-threatening deterioration of the following conditions:  CNS failure or compromise   Critical care was time spent personally by me on the following activities:  Blood draw for specimens, development of treatment plan with patient or surrogate, discussions with  consultants, evaluation of patient's response to treatment, examination of patient, obtaining history from patient or surrogate, ordering and performing treatments and interventions, ordering and review of laboratory studies, pulse oximetry, re-evaluation of patient's condition, review of old charts and ordering and review of radiographic studies   (including critical care time)  Medications Ordered in ED Medications  0.9 %  sodium chloride infusion (has no administration in time range)  sodium chloride 0.9 % bolus 500 mL (0 mLs Intravenous Stopped 04/08/18 1638)  naloxone (NARCAN) injection 0.4 mg (0.4 mg Intravenous Given 04/08/18 1607)  cefTRIAXone (ROCEPHIN) 2 g in sodium chloride 0.9 % 100 mL IVPB (0 g Intravenous Stopped 04/08/18 1858)     Initial Impression / Assessment and Plan / ED Course  I have reviewed the triage vital signs and the nursing notes.  Pertinent labs & imaging results that were available during my care of the patient were reviewed by me and considered in my medical decision making (see chart for details).  Clinical Course as of Apr 08 2004  Tue Apr 08, 2018  1739 Normal except cloudy urine, elevated glucose, elevated hemoglobin, elevated protein, increase leukocytes, red blood cells, and white cells.  Urinalysis, Routine w reflex microscopic(!) [EW]  1740 Normal  Ammonia [EW]  1740 Normal except elevated glucose, elevated BUN, elevated creatinine, low calcium, low GFR  Basic metabolic panel(!) [EW]  1740 Trending of electrolytes indicate significant changes in glucose, BUN and creatinine from baseline 2 weeks ago.   [EW]  1741 normal  CK(!) [EW]  1741 Normal except elevated WBC, low hemoglobin, low hematocrit  CBC with Differential(!) [EW]  1946 Urinalysis, Routine w reflex microscopic(!) [EW]  1946 Abnormal, essentially unchanged from sample obtained prior to change of Foley catheter.  Urine culture sent on the sample.   [EW]    Clinical Course User  Index [EW] Mancel BaleWentz, Roise Emert, MD     Patient Vitals for the past 24 hrs:  BP Temp Temp src Pulse Resp SpO2  04/08/18 1934 112/65 - - 99 16 99 %  04/08/18 1802 (!) 150/82 - - (!) 101 16 100 %  04/08/18 1756 - 97.9 F (36.6 C) Rectal - - -  04/08/18 1708 (!) 163/92 - - 88 16 100 %  04/08/18 1700 (!) 163/92 - - - - -  04/08/18 1607 113/77 - - 97 16 100 %  04/08/18 1600 113/77 - - 98 - 99 %  04/08/18 1531 - 98.4 F (36.9 C) Rectal - - -  04/08/18 1530 (!) 144/90 - - - - -  04/08/18 1500 (!) 153/82 - - - - -  04/08/18 1456 (!) 146/88 97.6 F (36.4 C) Oral - 16 94 %    6:52 PM Reevaluation with update and discussion. After initial assessment and treatment, an updated evaluation reveals ankle exam unchanged, he remains noncommunicative.  He did not respond to Narcan given earlier.  Patient's family updated on findings and plan. Mancel BaleElliott Veronda Gabor   Medical Decision Making: Mental status changes with UTI, and significant volume depletion, likely accounting for lethargy and obtunded status.  Patient apparently using more narcotics than usual, but did not respond to Narcan in the emergency department.  Doubt severe sepsis or metabolic instability.  Patient will require hospitalization for further observation and treatment.  CRITICAL CARE-yes Performed by: Mancel Bale  Nursing Notes Reviewed/ Care Coordinated Applicable Imaging Reviewed Interpretation of Laboratory Data incorporated into ED treatment   7:48 PM-Consult complete with hospitalist. Patient case explained and discussed.  He agrees to admit patient for further evaluation and treatment. Call ended at 8:05 PM  Plan: Admit  Final Clinical Impressions(s) / ED Diagnoses   Final diagnoses:  Urinary tract infection without hematuria, site unspecified  Confusion  Lethargy  AKI (acute kidney injury) [facility])    ED Discharge Orders    None       Mancel Bale, MD 04/08/18 2006

## 2018-04-08 NOTE — Telephone Encounter (Signed)
Copied from CRM [phone]#[id number]. Topic: General - Other >> Apr 08, 2018  1:33 PM Percival SpanishKennedy, Cheryl W wrote:  Pt wife would like a call back she has questions giving him his insulin

## 2018-04-08 NOTE — ED Triage Notes (Signed)
EMS reports from home, wife called for AMS, wife states confused beyond normal, possible UTI. Pt denies pain. Pt has foley.  BP 144/86 HR 90 RR 18 Sp02 98 RA CBG 279  20ga L forearm

## 2018-04-08 NOTE — Telephone Encounter (Signed)
Spoke with Jeremy Gill and she stated that patient is currently in the emergency room due to being very lethargic. She stated that she guess the hospital will take care of his insulin questions while he is there. She stated that she will keep [place] informed.

## 2018-04-08 NOTE — H&P (Signed)
History and Physical   Jeremy SalinesJames E Gill ZOX:[id number] DOB: [birth year] DOA: 04/08/2018  PCP: [place], [name], MD  Chief Complaint: less responsive  HPI: This is a 72 year old man with medical problems including insulin-dependent diabetes (A1c of 9.1% in 12/2017), hypertension, BPH with most recently urinary retention requiring Foley catheter placement, chronic low back pain, who presents from his home residence with confusion and decreased responsiveness.  Significant recent events include hospital stay from October 17 through March 18, 2018 where acute problems included pneumonia, NSTEMI medically managed, urinary retention requiring placement of Foley, acute on chronic anemia requiring 2 units of packed red blood cells.  He will transition to a skilled nursing facility for rehab, and was discharged home about 3 days ago.  History is obtained via discussion with the patient's spouse as the patient currently is confused.  It is reported to me that upon arrival home from his rehab facility, he was walking with a 4 wheeled walker and needing some help with his ADLs.  The spouse reports 2 months ago he was independent in his ADLs and enjoyed doing work around the house.  This morning, he was found to be lethargic and could not walk.  His spouse reports he slid out of his walker onto the ground, and was minimally responsive therefore prompting EMS call.  Other details include, he smokes a pipe, struggles with chronic low back pain managed with multiple agents including diclofenac, baclofen, gabapentin, and tramadol.  Specifically denies a history of cancer, heart failure, venous thromboembolism, stroke.  Reports home insulin regimen includes 20 units of basal insulin, does not use rapid acting insulin.  Also takes metformin.  Blood sugar this morning was between 250 and 300.  Does report diarrhea, nonbloody since arrival home.  ED Course: In the emergency department vital signs remarkable for heart rate  of 101, systolic blood pressure in the 140s, respiratory rate within normal limits.  Diagnostics obtained remarkable for leukocytosis with white count of 17.6, with neutrophil predominance, hemoglobin 9.6, platelet count 202.  BUN and creatinine of 32 and 1.5 respectively, his baseline creatinine appears to be less than 1.  Urinalysis with large leukocytes.  Chest x-ray without obvious new focal infiltrate.  He was given naloxone in the event that his altered mental status was related to opioid therapy.  He was also given ceftriaxone and 500 cc of normal saline.  Hospital medicine consulted for further management.  Review of Systems: A complete ROS was not able to be obtained due to the patient's current encephalopathy.  Past Medical History:  Diagnosis Date  . ANKLE SPRAIN 12/16/2006   Qualifier: Diagnosis of  By: Tawanna Coolerodd MD, Eugenio HoesJeffrey A   . ANXIETY 10/25/2006  . CELLULITIS, LEG, RIGHT 09/08/2007   Qualifier: Diagnosis of  By: Tawanna Coolerodd MD, Eugenio HoesJeffrey A   . Chronic pain disorder 11/30/2015  . COPD 10/25/2006  . 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 DIABETES MELLITUS, TYPE I 10/25/2006  . Diabetic polyneuropathy (HCC) 04/08/2007   Qualifier: Diagnosis of  By: Tawanna Coolerodd MD, Eugenio HoesJeffrey A   . 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 Essential hypertension 10/25/2006   Qualifier: Diagnosis of  By: Rosette Revealempleton, RN, Jorene MinorsAngela Dawn   . Generalized osteoarthritis of multiple sites 11/30/2015  . HYPERTENSION 10/25/2006  . 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 Low back pain with radiation 01/23/2016  .  Polyneuropathy in diabetes(357.2) 04/08/2007  . Psoriasis   . TOBACCO ABUSE 07/08/2007  . VENOUS INSUFFICIENCY 11/25/2007   Social History   Socioeconomic History  . Marital status: Married    Spouse name: Not on file  . Number of children: Not on file  . Years of education: Not on file  .  Highest education level: Not on file  Occupational History  . Occupation: Self Employed  Social Needs  . Financial resource strain: Not on file  . Food insecurity:    Worry: Not on file    Inability: Not on file  . Transportation needs:    Medical: Not on file    Non-medical: Not on file  Tobacco Use  . Smoking status: Current Every Day Smoker    Types: Pipe    Last attempt to quit: 05/29/2011    Years since quitting: 6.8  . Smokeless tobacco: Never Used  Substance and Sexual Activity  . Alcohol use: No  . Drug use: No  . Sexual activity: Not on file  Lifestyle  . Physical activity:    Days per week: Not on file    Minutes per session: Not on file  . Stress: Not on file  Relationships  . Social connections:    Talks on phone: Not on file    Gets together: Not on file    Attends religious service: Not on file    Active member of club or organization: Not on file    Attends meetings of clubs or organizations: Not on file    Relationship status: Not on file  . Intimate partner violence:    Fear of current or ex partner: Not on file    Emotionally abused: Not on file    Physically abused: Not on file    Forced sexual activity: Not on file  Other Topics Concern  . Not on file  Social History Narrative   Regular exercise-no   Family History  Problem Relation Age of Onset  . Cancer Mother        Unknown   . Diabetes Sister     Physical Exam: Vitals:   04/08/18 2000 04/08/18 2030 04/08/18 2100 04/08/18 2130  BP: 109/65 115/63 130/72 123/73  Pulse: 99 99 (!) 101 (!) 104  Resp:    16  Temp:      TempSrc:      SpO2: 100% 98% 99% 97%   General: Elderly chronically ill appearing white man ENT: Poor dentition, dry mucus membranes Cardiovascular: Rate 100-110 bpm. RR. No M/R/G. No LE edema.  Respiratory: CTA bilaterally anteriorly. No wheezes or crackles. Normal respiratory effort. Breathing room air. Abdomen: Soft, non-tender. No rebound or guarding. Skin: right  medial heel with wound, no active drainage, c/w pressure induced injury Musculoskeletal: No gross deformity  Psychiatric: Flat affect Neurologic: Will open eyes to tactile stimulation, withdraws from pain, will have intermittent myoclonic jerks (generalized), non-responsive to verbal stimuli  I have personally reviewed the following labs, culture data, and imaging studies.  Assessment/Plan:  #Acute encephalopathy with concomitant possible sepsis from UTI Course: S/P arrival home from rehab facility, with <24 hours of what spouse reports as acute confusional and less responsive state A/P: main considerations are medication related (on multiple sedating medications, including: tramadol, gabapentin - which can build up in setting of AKI, baclofen), infectious (with leukocytosis + HR > 100 meeting SIRS criteria; and leukocytes on UA - concerning for UTI), or metabolic.  Will focus efforts on holding sedating medications.  Continue  empiric ceftriaxone while awaiting for micro data (blood and urine cx), and optimizing metabolic conditions (AKI). In the past there has been concerns about underlying dementia.  #Other problems: -AKI: bCR <1, on admission, Cr of 1.5, suspect contribution of pre-renal from diarrhea + NSAID use. A/P: will provide IVF support with LR at 125 cc q h x 10 hrs then re-assess. -HTN: continue home BB at low dose -BPH: with recent urinary retention, continue with foley, hold tamsulosin -Elevated CV risk: continue ASA + statin -Insulin dependent DM: an A1c value would be inaccurate given recent pRBC transfusions; continue basal insulin at slightly lower dose (15 untis) with rapid acting correction insulin q 6 hours while he is NPO, hold home metformin -Home use of inhaler:in past was on steroid-LABA inhaler, continue -Hx of B12 def: continue PO formulation  DVT prophylaxis: Subq Heparin Code Status: Full code Disposition Plan: Anticipate D/C home when clinically  improved Consults called: none Admission status: admit to hospital medicine   Laurell Roof, MD [facility] Page:[phone]  If 7PM-7AM, please contact night-coverage [email or web address] Password [email or web address]  This document was created using the aid of voice recognition / dication software.

## 2018-04-09 ENCOUNTER — Observation Stay (HOSPITAL_COMMUNITY): Payer: PPO

## 2018-04-09 ENCOUNTER — Inpatient Hospital Stay (HOSPITAL_COMMUNITY): Payer: PPO

## 2018-04-09 ENCOUNTER — Other Ambulatory Visit: Payer: Self-pay

## 2018-04-09 ENCOUNTER — Encounter (HOSPITAL_COMMUNITY): Payer: Self-pay | Admitting: Radiology

## 2018-04-09 ENCOUNTER — Inpatient Hospital Stay (HOSPITAL_COMMUNITY)
Admit: 2018-04-09 | Discharge: 2018-04-09 | Disposition: A | Discharge status: Home / Self Care | Payer: PPO | Attending: Internal Medicine | Admitting: Internal Medicine

## 2018-04-09 DIAGNOSIS — I214 Non-ST elevation (NSTEMI) myocardial infarction: Secondary | ICD-10-CM | POA: Diagnosis present

## 2018-04-09 DIAGNOSIS — I872 Venous insufficiency (chronic) (peripheral): Secondary | ICD-10-CM | POA: Diagnosis present

## 2018-04-09 DIAGNOSIS — Y846 Urinary catheterization as the cause of abnormal reaction of the patient, or of later complication, without mention of misadventure at the time of the procedure: Secondary | ICD-10-CM | POA: Diagnosis present

## 2018-04-09 DIAGNOSIS — R531 Weakness: Secondary | ICD-10-CM | POA: Diagnosis not present

## 2018-04-09 DIAGNOSIS — E1122 Type 2 diabetes mellitus with diabetic chronic kidney disease: Secondary | ICD-10-CM | POA: Diagnosis present

## 2018-04-09 DIAGNOSIS — N183 Chronic kidney disease, stage 3 (moderate): Secondary | ICD-10-CM | POA: Diagnosis present

## 2018-04-09 DIAGNOSIS — N179 Acute kidney failure, unspecified: Secondary | ICD-10-CM | POA: Diagnosis present

## 2018-04-09 DIAGNOSIS — J189 Pneumonia, unspecified organism: Secondary | ICD-10-CM | POA: Diagnosis present

## 2018-04-09 DIAGNOSIS — D631 Anemia in chronic kidney disease: Secondary | ICD-10-CM | POA: Diagnosis present

## 2018-04-09 DIAGNOSIS — A419 Sepsis, unspecified organism: Secondary | ICD-10-CM | POA: Diagnosis present

## 2018-04-09 DIAGNOSIS — G8929 Other chronic pain: Secondary | ICD-10-CM | POA: Diagnosis present

## 2018-04-09 DIAGNOSIS — I10 Essential (primary) hypertension: Secondary | ICD-10-CM | POA: Diagnosis present

## 2018-04-09 DIAGNOSIS — R4020 Unspecified coma: Secondary | ICD-10-CM | POA: Diagnosis not present

## 2018-04-09 DIAGNOSIS — E785 Hyperlipidemia, unspecified: Secondary | ICD-10-CM | POA: Diagnosis present

## 2018-04-09 DIAGNOSIS — R6520 Severe sepsis without septic shock: Secondary | ICD-10-CM | POA: Diagnosis present

## 2018-04-09 DIAGNOSIS — E1142 Type 2 diabetes mellitus with diabetic polyneuropathy: Secondary | ICD-10-CM | POA: Diagnosis present

## 2018-04-09 DIAGNOSIS — T83518A Infection and inflammatory reaction due to other urinary catheter, initial encounter: Secondary | ICD-10-CM | POA: Diagnosis present

## 2018-04-09 DIAGNOSIS — E11649 Type 2 diabetes mellitus with hypoglycemia without coma: Secondary | ICD-10-CM | POA: Diagnosis present

## 2018-04-09 DIAGNOSIS — I129 Hypertensive chronic kidney disease with stage 1 through stage 4 chronic kidney disease, or unspecified chronic kidney disease: Secondary | ICD-10-CM | POA: Diagnosis present

## 2018-04-09 DIAGNOSIS — N401 Enlarged prostate with lower urinary tract symptoms: Secondary | ICD-10-CM | POA: Diagnosis present

## 2018-04-09 DIAGNOSIS — L89150 Pressure ulcer of sacral region, unstageable: Secondary | ICD-10-CM | POA: Diagnosis not present

## 2018-04-09 DIAGNOSIS — G9341 Metabolic encephalopathy: Secondary | ICD-10-CM

## 2018-04-09 DIAGNOSIS — F419 Anxiety disorder, unspecified: Secondary | ICD-10-CM | POA: Diagnosis present

## 2018-04-09 DIAGNOSIS — J440 Chronic obstructive pulmonary disease with acute lower respiratory infection: Secondary | ICD-10-CM | POA: Diagnosis present

## 2018-04-09 DIAGNOSIS — R338 Other retention of urine: Secondary | ICD-10-CM | POA: Diagnosis present

## 2018-04-09 DIAGNOSIS — L89152 Pressure ulcer of sacral region, stage 2: Secondary | ICD-10-CM | POA: Diagnosis present

## 2018-04-09 DIAGNOSIS — N390 Urinary tract infection, site not specified: Secondary | ICD-10-CM | POA: Diagnosis present

## 2018-04-09 DIAGNOSIS — F17290 Nicotine dependence, other tobacco product, uncomplicated: Secondary | ICD-10-CM | POA: Diagnosis present

## 2018-04-09 DIAGNOSIS — I959 Hypotension, unspecified: Secondary | ICD-10-CM | POA: Diagnosis not present

## 2018-04-09 DIAGNOSIS — Z515 Encounter for palliative care: Secondary | ICD-10-CM | POA: Diagnosis not present

## 2018-04-09 DIAGNOSIS — R410 Disorientation, unspecified: Secondary | ICD-10-CM | POA: Diagnosis present

## 2018-04-09 DIAGNOSIS — Z7401 Bed confinement status: Secondary | ICD-10-CM | POA: Diagnosis not present

## 2018-04-09 DIAGNOSIS — M2550 Pain in unspecified joint: Secondary | ICD-10-CM | POA: Diagnosis not present

## 2018-04-09 LAB — GLUCOSE, CAPILLARY
GLUCOSE-CAPILLARY: 107 mg/dL — AB (ref 70–99)
GLUCOSE-CAPILLARY: 71 mg/dL (ref 70–99)
GLUCOSE-CAPILLARY: 92 mg/dL (ref 70–99)
GLUCOSE-CAPILLARY: 94 mg/dL (ref 70–99)
Glucose-Capillary: 70 mg/dL (ref 70–99)
Glucose-Capillary: 42 mg/dL — CL (ref 70–99)
Glucose-Capillary: 87 mg/dL (ref 70–99)

## 2018-04-09 LAB — CBC
HEMATOCRIT: 32.3 % — AB (ref 39.0–52.0)
Hemoglobin: 10 g/dL — ABNORMAL LOW (ref 13.0–17.0)
MCH: 29.3 pg (ref 26.0–34.0)
MCHC: 31 g/dL (ref 30.0–36.0)
MCV: 94.7 fL (ref 80.0–100.0)
Platelets: 207 10*3/uL (ref 150–400)
RBC: 3.41 MIL/uL — ABNORMAL LOW (ref 4.22–5.81)
RDW: 13.3 % (ref 11.5–15.5)
WBC: 17.7 10*3/uL — ABNORMAL HIGH (ref 4.0–10.5)
nRBC: 0 % (ref 0.0–0.2)

## 2018-04-09 LAB — BASIC METABOLIC PANEL
Anion gap: 11 (ref 5–15)
BUN: 32 mg/dL — AB (ref 8–23)
CHLORIDE: 105 mmol/L (ref 98–111)
CO2: 25 mmol/L (ref 22–32)
CREATININE: 1.45 mg/dL — AB (ref 0.61–1.24)
Calcium: 8.5 mg/dL — ABNORMAL LOW (ref 8.9–10.3)
GFR calc Af Amer: 54 mL/min — ABNORMAL LOW (ref >60)
GFR calc non Af Amer: 47 mL/min — ABNORMAL LOW (ref >60)
Glucose, Bld: 85 mg/dL (ref 70–99)
Potassium: 4.1 mmol/L (ref 3.5–5.1)
Sodium: 141 mmol/L (ref 135–145)

## 2018-04-09 LAB — LACTIC ACID, PLASMA
LACTIC ACID, VENOUS: 0.7 mmol/L (ref 0.5–1.9)
Lactic Acid, Venous: 0.7 mmol/L (ref 0.5–1.9)

## 2018-04-09 LAB — PROCALCITONIN: Procalcitonin: 0.52 ng/mL

## 2018-04-09 MED ORDER — VANCOMYCIN HCL 10 G IV SOLR
1500.0000 mg | Freq: Once | INTRAVENOUS | Status: AC
  Administered 2018-04-09: 1500 mg via INTRAVENOUS
  Filled 2018-04-09: qty 1500

## 2018-04-09 MED ORDER — SODIUM CHLORIDE 0.9 % IV SOLN
1.0000 g | INTRAVENOUS | Status: DC
  Administered 2018-04-09 – 2018-04-10 (×2): 1 g via INTRAVENOUS
  Filled 2018-04-09 (×3): qty 1

## 2018-04-09 MED ORDER — DEXTROSE-NACL 5-0.45 % IV SOLN
INTRAVENOUS | Status: DC
  Administered 2018-04-09 – 2018-04-10 (×2): via INTRAVENOUS

## 2018-04-09 MED ORDER — DIAZEPAM 5 MG/ML IJ SOLN
5.0000 mg | Freq: Once | INTRAMUSCULAR | Status: DC
  Filled 2018-04-09: qty 2

## 2018-04-09 MED ORDER — GADOBUTROL 1 MMOL/ML IV SOLN
7.0000 mL | Freq: Once | INTRAVENOUS | Status: AC | PRN
  Administered 2018-04-09: 7 mL via INTRAVENOUS

## 2018-04-09 MED ORDER — GABAPENTIN 100 MG PO CAPS: 200.0000 mg | ORAL_CAPSULE | Freq: Three times a day (TID) | ORAL | Status: DC

## 2018-04-09 MED ORDER — DEXTROSE 50 % IV SOLN: 25.0000 g | Freq: Once | INTRAVENOUS | Status: DC

## 2018-04-09 MED ORDER — SODIUM CHLORIDE 0.9 % IV BOLUS
500.0000 mL | Freq: Once | INTRAVENOUS | Status: AC
  Administered 2018-04-09: 500 mL via INTRAVENOUS

## 2018-04-09 MED ORDER — HYDRALAZINE HCL 20 MG/ML IJ SOLN
10.0000 mg | Freq: Three times a day (TID) | INTRAMUSCULAR | Status: DC | PRN
  Administered 2018-04-09 – 2018-04-10 (×2): 10 mg via INTRAVENOUS
  Filled 2018-04-09 (×3): qty 1

## 2018-04-09 MED ORDER — SODIUM CHLORIDE 0.9 % IV SOLN
INTRAVENOUS | Status: DC
  Administered 2018-04-09: 09:00:00 via INTRAVENOUS

## 2018-04-09 MED ORDER — INSULIN ASPART 100 UNIT/ML ~~LOC~~ SOLN
0.0000 [IU] | SUBCUTANEOUS | Status: DC
  Administered 2018-04-10: 9 [IU] via SUBCUTANEOUS
  Administered 2018-04-10: 3 [IU] via SUBCUTANEOUS
  Administered 2018-04-10: 7 [IU] via SUBCUTANEOUS
  Administered 2018-04-10: 3 [IU] via SUBCUTANEOUS
  Administered 2018-04-11: 5 [IU] via SUBCUTANEOUS
  Administered 2018-04-11: 7 [IU] via SUBCUTANEOUS

## 2018-04-09 MED ORDER — DEXTROSE 50 % IV SOLN
INTRAVENOUS | Status: AC
  Filled 2018-04-09: qty 50

## 2018-04-09 MED ORDER — ACETAMINOPHEN 650 MG RE SUPP
650.0000 mg | Freq: Three times a day (TID) | RECTAL | Status: DC | PRN
  Administered 2018-04-09: 650 mg via RECTAL
  Filled 2018-04-09: qty 1

## 2018-04-09 MED ORDER — METOPROLOL TARTRATE 5 MG/5ML IV SOLN
2.5000 mg | Freq: Two times a day (BID) | INTRAVENOUS | Status: DC
  Administered 2018-04-09: 2.5 mg via INTRAVENOUS
  Filled 2018-04-09 (×2): qty 5

## 2018-04-09 MED ORDER — VANCOMYCIN HCL IN DEXTROSE 1-5 GM/200ML-% IV SOLN
1000.0000 mg | INTRAVENOUS | Status: DC
  Administered 2018-04-10 – 2018-04-12 (×3): 1000 mg via INTRAVENOUS
  Filled 2018-04-09 (×3): qty 200

## 2018-04-09 MED ORDER — HYDRALAZINE HCL 20 MG/ML IJ SOLN
5.0000 mg | Freq: Once | INTRAMUSCULAR | Status: AC
  Administered 2018-04-09: 5 mg via INTRAVENOUS
  Filled 2018-04-09: qty 1

## 2018-04-09 MED ORDER — HYDROCODONE-ACETAMINOPHEN 5-325 MG PO TABS: 2.0000 | ORAL_TABLET | Freq: Once | ORAL | Status: DC

## 2018-04-09 MED ORDER — BACLOFEN 10 MG PO TABS: 10.0000 mg | ORAL_TABLET | Freq: Three times a day (TID) | ORAL | Status: DC

## 2018-04-09 MED ORDER — ENSURE ENLIVE PO LIQD
237.0000 mL | Freq: Two times a day (BID) | ORAL | Status: DC
  Administered 2018-04-10: 237 mL via ORAL

## 2018-04-09 NOTE — Progress Notes (Signed)
PROGRESS NOTE  Jeremy SalinesJames E Gill UJW:[id number] DOB: 1945/08/12 DOA: 04/08/2018 PCP: [place], Jeremy Gill  HPI/Recap of past 24 hours: HPI from Dr Jeremy JasmineKuhlman This is a 72 year old man with medical problems including insulin-dependent diabetes (A1c of 9.1% in 12/2017), hypertension, BPH with most recently urinary retention requiring Foley catheter placement, chronic low back pain, who presents from his home with confusion and decreased responsiveness. Significant recent events include hospital stay from October 17 through March 18, 2018 where acute problems included pneumonia, NSTEMI medically managed, urinary retention requiring placement of Foley, acute on chronic anemia requiring 2 units of packed red blood cells. Transferred to a skilled nursing facility for rehab, and was discharged home about 3 days ago.  History was obtained via discussion with the patient's spouse as the patient currently is confused. Upon arrival home from his rehab facility, he was walking with a 4 wheeled walker and needing some help with his ADLs.  The spouse reports 2 months ago he was independent in his ADLs and enjoyed doing work around the house. Prior to presentation, he was found to be lethargic and could not walk.  His spouse reports he slid out of his walker onto the ground, and was minimally responsive therefore prompting EMS call. In the ED, found to have leukocytosis with white count of 17.6, creatinine of 1.5, baseline creatinine appears to be less than 1.  Urinalysis with large leukocytes.  Chest x-ray without obvious new focal infiltrate. Pt admitted for further management.   Today, pt still very lethargic, minimally responsive, not able to answer questions.  Assessment/Plan: Active Problems:   UTI (urinary tract infection)  Acute metabolic encephalopathy likely 2/2 sepsis from UTI versus unstageable sacral ulcer, ??  Polypharmacy Currently afebrile, tachycardic, with leukocytosis Lactic acid, procalcitonin  pending UA showed large leukocytes, >50 WBC UC, BC x2 pending Chest x-ray shows improvement in left lower lobe pneumonia, atelectasis CT head negative for any acute intracranial process MRI of the brain pending EEG pending results Continue to hold sedating medications Continue empiric IV antibiotics Switch patient over to telemetry for closer monitoring  Diabetes mellitus type 2 Episode of hypoglycemia this a.m. Last A1c 9.1 on 12/2017 Status post D50 this am Start IV fluid D5/1/2 NS Held Lantus, continue SSI as needed Hold home metformin  AKI Baseline creatinine normal, on admission 1.5, likely prerenal Vs NSAID use Continue IV fluids Daily BMP  Multiple ulcers including unstageable sacral, stage I on left heel, stage II on right heel WOC nurse on board, appreciate recommendations Continue empiric IV antibiotics  Hypertension Switch home p.o. BB to IV metoprolol  Hyperlipidemia Continue statins  Normocytic anemia Hemoglobin at baseline, unclear etiology Daily CBC  BPH, with recent urinary retention Continue Foley    Code Status: Full  Family Communication: Spoke to wife over the phone  Disposition Plan: Home once work-up is complete and patient is back to baseline   Consultants:  None  Procedures:  None  Antimicrobials:  IV vancomycin  IV cefepime  DVT prophylaxis: Heparin   Objective: Vitals:   04/09/18 0453 04/09/18 1042 04/09/18 1406 04/09/18 1428  BP: (!) 152/90  (!) 176/95 (!) 159/86  Pulse: (!) 103  (!) 116 (!) 114  Resp: 15  12   Temp: 98 F (36.7 C)  99.2 F (37.3 C)   TempSrc: Axillary  Oral   SpO2: 93% 99% 99%     Intake/Output Summary (Last 24 hours) at 04/09/2018 1602 Last data filed at 04/09/2018 1400 Gross per 24  hour  Intake 1134.06 ml  Output 1050 ml  Net 84.06 ml   There were no vitals filed for this visit.  Exam:   General: Lethargic, minimally responsive  Cardiovascular: S1, S2 present  Respiratory:  CTA B  Abdomen: Soft, nontender, nondistended, bowel sounds present  Musculoskeletal: No pedal edema bilaterally  Skin: Unstageable sacral ulcer, stage I left heel, stage II right heel  Psychiatry: Unable to assess   Data Reviewed: CBC: Recent Labs  Lab 04/08/18 1604 04/09/18 0343  WBC 17.6* 17.7*  NEUTROABS 14.7*  --   HGB 9.6* 10.0*  HCT 30.4* 32.3*  MCV 94.7 94.7  PLT 202 207   Basic Metabolic Panel: Recent Labs  Lab 04/08/18 1604 04/09/18 0343  NA 139 141  K 4.1 4.1  CL 107 105  CO2 24 25  GLUCOSE 223* 85  BUN 32* 32*  CREATININE 1.50* 1.45*  CALCIUM 7.7* 8.5*   GFR: Estimated Creatinine Clearance: 46.4 mL/min (A) (by C-G formula based on SCr of 1.45 mg/dL (H)). Liver Function Tests: No results for input(s): AST, ALT, ALKPHOS, BILITOT, PROT, ALBUMIN in the last 168 hours. No results for input(s): LIPASE, AMYLASE in the last 168 hours. Recent Labs  Lab 04/08/18 1604  AMMONIA 15   Coagulation Profile: No results for input(s): INR, PROTIME in the last 168 hours. Cardiac Enzymes: Recent Labs  Lab 04/08/18 1604  CKTOTAL 48*   BNP (last 3 results) No results for input(s): PROBNP in the last 8760 hours. HbA1C: No results for input(s): HGBA1C in the last 72 hours. CBG: Recent Labs  Lab 04/08/18 2232 04/09/18 0450 04/09/18 0905 04/09/18 0928 04/09/18 1337  GLUCAP 140* 70 42* 92 94   Lipid Profile: No results for input(s): CHOL, HDL, LDLCALC, TRIG, CHOLHDL, LDLDIRECT in the last 72 hours. Thyroid Function Tests: No results for input(s): TSH, T4TOTAL, FREET4, T3FREE, THYROIDAB in the last 72 hours. Anemia Panel: No results for input(s): VITAMINB12, FOLATE, FERRITIN, TIBC, IRON, RETICCTPCT in the last 72 hours. Urine analysis:    Component Value Date/Time   COLORURINE YELLOW 04/08/2018 1802   APPEARANCEUR CLOUDY (A) 04/08/2018 1802   LABSPEC 1.010 04/08/2018 1802   PHURINE 6.0 04/08/2018 1802   GLUCOSEU 50 (A) 04/08/2018 1802   GLUCOSEU  NEGATIVE 09/30/2008 0851   HGBUR LARGE (A) 04/08/2018 1802   HGBUR trace-lysed 03/29/2010 0940   BILIRUBINUR NEGATIVE 04/08/2018 1802   BILIRUBINUR neg 01/13/2014 1141   KETONESUR NEGATIVE 04/08/2018 1802   PROTEINUR 100 (A) 04/08/2018 1802   UROBILINOGEN 1.0 01/13/2014 1141   UROBILINOGEN 0.2 03/29/2010 0940   NITRITE NEGATIVE 04/08/2018 1802   LEUKOCYTESUR LARGE (A) 04/08/2018 1802   Sepsis Labs: @LABRCNTIP (procalcitonin:4,lacticidven:4)  ) Recent Results (from the past 240 hour(s))  Culture, blood (routine x 2)     Status: None (Preliminary result)   Collection Time: 04/08/18 11:19 PM  Result Value Ref Range Status   Specimen Description   Final    BLOOD RIGHT HAND Performed at [facility], [address], [place], [place] [address]    Special Requests   Final    BOTTLES DRAWN AEROBIC AND ANAEROBIC Blood Culture adequate volume Performed at [facility], [address], [place], [place] [address]    Culture PENDING  Incomplete   Report Status PENDING  Incomplete  Culture, blood (routine x 2)     Status: None (Preliminary result)   Collection Time: 04/08/18 11:19 PM  Result Value Ref Range Status   Specimen Description   Final  BLOOD RIGHT FOREARM Performed at [facility], [address] [place], [place] [address]    Special Requests   Final    AEROBIC BOTTLE ONLY Blood Culture adequate volume Performed at [facility], [address], [place], [place] [address]    Culture PENDING  Incomplete   Report Status PENDING  Incomplete      Studies: Ct Head Wo Contrast  Result Date: 04/09/2018 CLINICAL DATA:  Altered level of consciousness. EXAM: CT HEAD WITHOUT CONTRAST TECHNIQUE: Contiguous axial images were obtained from the base of the skull through the vertex without intravenous contrast. COMPARISON:  CT scan of March 13, 2018. FINDINGS: Brain: Mild diffuse cortical atrophy is noted. Mild chronic ischemic white  matter disease is noted. No mass effect or midline shift is noted. Ventricular size is within normal limits. There is no evidence of mass lesion, hemorrhage or acute infarction. Vascular: No hyperdense vessel or unexpected calcification. Skull: Normal. Negative for fracture or focal lesion. Sinuses/Orbits: No acute finding. Other: None. IMPRESSION: Mild diffuse cortical atrophy. Mild chronic ischemic white matter disease. No acute intracranial abnormality seen. Electronically Signed   By: Lupita Raider, M.D.   On: 04/09/2018 13:11   Dg Chest Port 1 View  Result Date: 04/08/2018 CLINICAL DATA:  Confusion beyond normal. Possible urinary tract infection. EXAM: PORTABLE CHEST 1 VIEW COMPARISON:  03/13/2018 FINDINGS: Stable heart size and mediastinal contours allowing for patient rotation. Nonaneurysmal thoracic aorta. Interval improvement and partial clearing of left-sided lower lobe pneumonia since prior. Small retrocardiac infiltrate or atelectasis medially within the right lung base is suggested on current exam. No acute osseous abnormality. IMPRESSION: 1. Interval improvement in left lower lobe pneumonia. 2. Small retrocardiac infiltrate or atelectasis medially within the right lung base is suggested on current exam. Electronically Signed   By: Tollie Eth M.D.   On: 04/08/2018 16:59    Scheduled Meds: . aspirin EC  81 mg Oral Daily  . atorvastatin  40 mg Oral Daily  . dextrose  25 g Intravenous Once  . dextrose      . DULoxetine  60 mg Oral Daily  . feeding supplement (ENSURE ENLIVE)  237 mL Oral BID BM  . heparin  5,000 Units Subcutaneous Q8H  . insulin aspart  0-9 Units Subcutaneous Q6H  . metoprolol tartrate  25 mg Oral Daily  . mometasone-formoterol  2 puff Inhalation BID  . sodium chloride flush  3 mL Intravenous Q12H  . vitamin B-12  1,000 mcg Oral Daily    Continuous Infusions: . ceFEPime (MAXIPIME) IV    . dextrose 5 % and 0.45% NaCl 100 mL/hr at 04/09/18 0936  . [START ON  04/10/2018] vancomycin       LOS: 0 days     Briant Cedar, Gill [facility]   If 7PM-7AM, please contact night-coverage [email or web address] 04/09/2018, 4:02 PM

## 2018-04-09 NOTE — Progress Notes (Signed)
Initial Nutrition Assessment  DOCUMENTATION CODES:   Non-severe (moderate) malnutrition in context of chronic illness  INTERVENTION:   -Diet advancement per MD -Continue Ensure Enlive po BID, each supplement provides 350 kcal and 20 grams of protein once diet is advanced. -Recommend Multivitamin with minerals daily  NUTRITION DIAGNOSIS:   Moderate Malnutrition related to dysphagia, chronic illness, wound healing as evidenced by percent weight loss, mild fat depletion, moderate muscle depletion.  GOAL:   Patient will meet greater than or equal to 90% of their needs  MONITOR:   Diet advancement, Labs, Weight trends, I & O's, Skin  REASON FOR ASSESSMENT:   Malnutrition Screening Tool    ASSESSMENT:    72 year old man with medical problems including insulin-dependent diabetes (A1c of 9.1% in 12/2017), hypertension, BPH with most recently urinary retention requiring Foley catheter placement, chronic low back pain, who presents from his home residence with confusion and decreased responsiveness.  Patient continues to be lethargic. Per SLP note, unable to have swallow eval at this time. Pt with history of dysphagia. Pt with multiple pressure injuries. Currently NPO. Recommend Ensure supplements and daily MVI once diet is advanced.   Per weight records, pt has lost 21 lb since 5/7 (12% wt loss x 6 months, significant for time frame).   Medications: D50 solution once, vitamin B-12 tablet daily, D5-.45% NaCl infusion at 100 ml/hr  Labs reviewed: CBGs: 42-92 GFR: 47    NUTRITION - FOCUSED PHYSICAL EXAM:    Most Recent Value  Orbital Region  Mild depletion  Upper Arm Region  Mild depletion  Thoracic and Lumbar Region  Unable to assess  Buccal Region  Mild depletion  Temple Region  Unable to assess  Clavicle Bone Region  Moderate depletion  Clavicle and Acromion Bone Region  Moderate depletion  Scapular Bone Region  Unable to assess  Dorsal Hand  Mild depletion  Patellar  Region  No depletion  Anterior Thigh Region  Unable to assess  Posterior Calf Region  Mild depletion  Edema (RD Assessment)  None       Diet Order:   Diet Order            Diet NPO time specified Except for: Sips with Meds, Ice Chips  Diet effective now              EDUCATION NEEDS:   Not appropriate for education at this time  Skin:  Skin Integrity Issues:: Stage II, Unstageable Stage II: right heel, left heel, buttocks Unstageable: coccyx  Last BM:  PTA  Height:   Ht Readings from Last 1 Encounters:  04/03/18 5\' 10"  (1.778 m)    Weight:   Wt Readings from Last 1 Encounters:  04/03/18 71.2 kg    Ideal Body Weight:  75.5 kg  BMI:  22.5 kg/m^2  Estimated Nutritional Needs:   Kcal:  1900-2100  Protein:  90-100g  Fluid:  2L/day   Jeremy FrancoLindsey Donaven Criswell, MS, RD, LDN [facility] Clinical Dietitian Pager: [phone] After Hours Pager: [phone]

## 2018-04-09 NOTE — Progress Notes (Signed)
Pharmacy Antibiotic Note  Jeremy Gill is a 72 y.o. male admitted on 04/08/2018 with confusion and decreased responsiveness.  Pharmacy has been consulted for vancomycin and cefepime dosing for sepsis from possible UTI, sacral decubitus.   Plan: Cefepime 1 g iv q 24 hours.   Vancomycin 1500 mg iv once followed by 1000 mg iv q 24 h. Predicted AUC 491 IBW/TBW.  Goal AUC 400-550.      Temp (24hrs), Avg:98 F (36.7 C), Min:97.6 F (36.4 C), Max:98.4 F (36.9 C)  Recent Labs  Lab 04/08/18 1604 04/09/18 0343  WBC 17.6* 17.7*  CREATININE 1.50* 1.45*    Estimated Creatinine Clearance: 46.4 mL/min (A) (by C-G formula based on SCr of 1.45 mg/dL (H)).    No Known Allergies  Antimicrobials this admission: 11/12 CTX x 1 11/13 vancomycin >>  11/13 cefepime >>   Dose adjustments this admission:   Microbiology results: 11/12 BCx:  UCx:   C diff:   Thank you for allowing pharmacy to be a part of this patient's care.  Luisa HartChristy, Nomi Rudnicki D 04/09/2018 12:03 PM

## 2018-04-09 NOTE — Evaluation (Signed)
SLP Cancellation Note  Patient Details Name: Jeremy Gill MRN: [id number] DOB: 1946/01/29   Cancelled treatment:       Reason Eval/Treat Not Completed: Other (comment)(spoke to RN who reports pt is lethargic and unable to participate in swallow eval at this time, RN states pt to have a head CT, asked RN to page this SLP if pt awakens adequately, pt known to this SLP - has h/o dysphagia )   Jeremy Burnetamara Enjoli Tidd, MS [facility] SLP Acute Rehab Services Pager [phone] Office [phone]   Chales AbrahamsKimball, Madilyn Cephas Ann 04/09/2018, 9:50 AM

## 2018-04-09 NOTE — Significant Event (Addendum)
Rapid Response Event Note  Overview: Time Called: 1913 Arrival Time: 1920 Event Type: Other (Comment)(Lethargic, febrile)  Initial Focused Assessment: Patient lying in bed, no eye opening but responds to painful stimuli. Movement noted in all four extremities. Per RN patient disoriented x4. Patient not answering questions upon my assessment, but following some commands. Patient does appear lethargic, and was admitted with symptoms of lethargy. Currently patient febrile with rectal temperature of 101F, RN administered rectal Tylenol. Upper lip swelling noted, no signs of respiratory distress or airway compromise, and oxygen saturation is maintaining >92% on room air. CBG 71, maintenance fluids with dextrose restarted.  Interventions: I recommended RN consult with the on-call provider and suggest orders for trending lactic acid and recommended IV push of dextrose to facilitate CBG stabilization, if ordered by NP.  Plan of Care (if not transferred): RN to continue to monitor mentation, if patient becomes more lethargic RN to call rapid response. MRI completed earlier, no sign of stroke per results.  Event Summary:  Jeremy Gill

## 2018-04-09 NOTE — Care Management (Signed)
Per [facility] rep pt had been referred to them when he discharged from [facility]. They had not seen him yet at home. He had orders for HHPT/OT/Aide prior to admission. Will need resumption orders at DC. Sandford Crazeora Christopherjohn Schiele RN,BSN [phone]

## 2018-04-09 NOTE — Progress Notes (Signed)
EEG Completed; Results Pending  

## 2018-04-09 NOTE — Progress Notes (Signed)
Report called to ArkoeMelissa, RN on 4 east

## 2018-04-09 NOTE — Consult Note (Addendum)
WOC Nurse wound consult note Reason for Consult: coccygeal unstageable, left heel stage 1, right heel stage 2 Wound type:pressure Pressure Injury POA: Yes Measurement:coccyx wound 1cm x 0.5cm x 0.1cm, 100% white slough wound bed, scant drainage, reddened skin surrounding. Right medial heel 3cm x 4cm x 0.1cm stage 2 with a deeper stage 2 in center that measures 1cm round x 0.1cm, 100% red wound bed, dry peeling skin surrounding. Left posterior heel 4cm round stage I Wound bed:see above Drainage (amount, consistency, odor) see above Periwound:see above Dressing procedure/placement/frequency: I have provided nurses with orders for NS moist to dry dressings BID to sacrum by peeling back protective foam. Foam to both heels and Prevalon boots. Nutritional level not evaluated this admission but was poor last admission. Supplements have been ordered. We will not follow, but will remain available to this patient, to nursing, and the medical and/or surgical teams.  Please re-consult if we need to assist further.  Barnett HatterMelinda Sebastin Perlmutter, RN-C, WTA-C, O[facility] Wound Treatment Associate Ostomy Care Associate

## 2018-04-09 NOTE — Progress Notes (Signed)
CRITICAL VALUE ALERT  Critical Value: CBG 42  Date & Time Notied:  0906  Provider Notified: 0907  Orders Received/Actions taken: gave D50 iv

## 2018-04-10 ENCOUNTER — Encounter (HOSPITAL_COMMUNITY): Payer: Self-pay

## 2018-04-10 ENCOUNTER — Inpatient Hospital Stay (HOSPITAL_COMMUNITY): Payer: PPO

## 2018-04-10 DIAGNOSIS — R410 Disorientation, unspecified: Secondary | ICD-10-CM

## 2018-04-10 LAB — CBC WITH DIFFERENTIAL/PLATELET
Abs Immature Granulocytes: 0.13 10*3/uL — ABNORMAL HIGH (ref 0.00–0.07)
BASOS ABS: 0 10*3/uL (ref 0.0–0.1)
Basophils Relative: 0 %
Eosinophils Absolute: 0.1 10*3/uL (ref 0.0–0.5)
Eosinophils Relative: 1 %
HEMATOCRIT: 37.5 % — AB (ref 39.0–52.0)
HEMOGLOBIN: 11.8 g/dL — AB (ref 13.0–17.0)
Immature Granulocytes: 1 %
LYMPHS ABS: 1.8 10*3/uL (ref 0.7–4.0)
Lymphocytes Relative: 11 %
MCH: 28.9 pg (ref 26.0–34.0)
MCHC: 31.5 g/dL (ref 30.0–36.0)
MCV: 91.9 fL (ref 80.0–100.0)
Monocytes Absolute: 1.1 10*3/uL — ABNORMAL HIGH (ref 0.1–1.0)
Monocytes Relative: 6 %
NEUTROS PCT: 81 %
NRBC: 0 % (ref 0.0–0.2)
Neutro Abs: 13.8 10*3/uL — ABNORMAL HIGH (ref 1.7–7.7)
Platelets: 266 10*3/uL (ref 150–400)
RBC: 4.08 MIL/uL — ABNORMAL LOW (ref 4.22–5.81)
RDW: 13.3 % (ref 11.5–15.5)
WBC: 16.9 10*3/uL — ABNORMAL HIGH (ref 4.0–10.5)

## 2018-04-10 LAB — GLUCOSE, CAPILLARY
Glucose-Capillary: 149 mg/dL — ABNORMAL HIGH (ref 70–99)
Glucose-Capillary: 204 mg/dL — ABNORMAL HIGH (ref 70–99)
Glucose-Capillary: 230 mg/dL — ABNORMAL HIGH (ref 70–99)
Glucose-Capillary: 316 mg/dL — ABNORMAL HIGH (ref 70–99)

## 2018-04-10 LAB — URINE CULTURE: Culture: <10000 — AB

## 2018-04-10 LAB — BASIC METABOLIC PANEL WITH GFR
Anion gap: 14 (ref 5–15)
BUN: 25 mg/dL — ABNORMAL HIGH (ref 8–23)
CO2: 25 mmol/L (ref 22–32)
Calcium: 8.7 mg/dL — ABNORMAL LOW (ref 8.9–10.3)
Chloride: 102 mmol/L (ref 98–111)
Creatinine, Ser: 1.53 mg/dL — ABNORMAL HIGH (ref 0.61–1.24)
GFR calc Af Amer: 51 mL/min — ABNORMAL LOW (ref >60)
GFR calc non Af Amer: 44 mL/min — ABNORMAL LOW (ref >60)
Glucose, Bld: 198 mg/dL — ABNORMAL HIGH (ref 70–99)
Potassium: 3.8 mmol/L (ref 3.5–5.1)
Sodium: 141 mmol/L (ref 135–145)

## 2018-04-10 LAB — D-DIMER, QUANTITATIVE: D-Dimer, Quant: 2.11 ug{FEU}/mL — ABNORMAL HIGH (ref 0.00–0.50)

## 2018-04-10 LAB — PROCALCITONIN: Procalcitonin: 0.35 ng/mL

## 2018-04-10 MED ORDER — HYDRALAZINE HCL 20 MG/ML IJ SOLN
10.0000 mg | Freq: Once | INTRAMUSCULAR | Status: AC
  Administered 2018-04-10: 10 mg via INTRAVENOUS
  Filled 2018-04-10: qty 1

## 2018-04-10 MED ORDER — METOPROLOL TARTRATE 5 MG/5ML IV SOLN
5.0000 mg | Freq: Two times a day (BID) | INTRAVENOUS | Status: DC
  Administered 2018-04-10: 5 mg via INTRAVENOUS
  Filled 2018-04-10: qty 5

## 2018-04-10 MED ORDER — GLUCERNA SHAKE PO LIQD
237.0000 mL | Freq: Three times a day (TID) | ORAL | Status: DC
  Administered 2018-04-10 – 2018-04-15 (×15): 237 mL via ORAL
  Filled 2018-04-10 (×18): qty 237

## 2018-04-10 MED ORDER — TECHNETIUM TC 99M DIETHYLENETRIAME-PENTAACETIC ACID
29.1400 | Freq: Once | INTRAVENOUS | Status: AC | PRN
  Administered 2018-04-10: 29.14 via RESPIRATORY_TRACT

## 2018-04-10 MED ORDER — ISOSORBIDE MONONITRATE ER 30 MG PO TB24
30.0000 mg | ORAL_TABLET | Freq: Every day | ORAL | Status: DC
  Administered 2018-04-10 – 2018-04-14 (×5): 30 mg via ORAL
  Filled 2018-04-10 (×6): qty 1

## 2018-04-10 MED ORDER — METOPROLOL TARTRATE 50 MG PO TABS
50.0000 mg | ORAL_TABLET | Freq: Two times a day (BID) | ORAL | Status: DC
  Administered 2018-04-10 – 2018-04-15 (×10): 50 mg via ORAL
  Filled 2018-04-10 (×11): qty 1

## 2018-04-10 MED ORDER — TECHNETIUM TO 99M ALBUMIN AGGREGATED
4.3000 | Freq: Once | INTRAVENOUS | Status: AC
  Administered 2018-04-10: 4.3 via INTRAVENOUS

## 2018-04-10 MED ORDER — SODIUM CHLORIDE 0.9 % IV SOLN
INTRAVENOUS | Status: DC | PRN
  Administered 2018-04-10 – 2018-04-12 (×2): 250 mL via INTRAVENOUS

## 2018-04-10 MED ORDER — METOPROLOL TARTRATE 5 MG/5ML IV SOLN
5.0000 mg | Freq: Once | INTRAVENOUS | Status: AC
  Administered 2018-04-10: 5 mg via INTRAVENOUS

## 2018-04-10 NOTE — Procedures (Signed)
ELECTROENCEPHALOGRAM REPORT   Patient: Jeremy Gill       Room #: [id number] EEG No. ID: [id number] Age: 72 y.o.        Sex: male Referring Physician: Sharolyn DouglasEzenduka Report Date:  04/10/2018        Interpreting Physician: Thana FarrEYNOLDS, Tharon Bomar  History: Jeremy Gill is an 72 y.o. male with altered mental status  Medications:  ASA, Lipitor, Cefepime, Cymbalta, Insulin, Lopressor, Dulera, Vancomycin, B12  Conditions of Recording:  This is a 21 channel routine scalp EEG performed with bipolar and monopolar montages arranged in accordance to the international 10/20 system of electrode placement. One channel was dedicated to EKG recording.  The patient is in the lethargic state.  Description:  The background activity is slow and poorly organized.  It consists of a low voltage, polymorphic delta activity that is diffusely distributed.  Also noted are frequent intermittent discharges of triphasic morphology that are noted over the left hemisphere.  These often exhibit spread to the right hemisphere and appear more generalized.  There are occasional short periods of attenuation that occur synchronously over the hemispheres as well.   Hyperventilation and intermittent photic stimulation were not performed   IMPRESSION: This is an abnormal electroencephalogram secondary to left hemispheric PLEDs/GPEDs activity.     Thana FarrLeslie Emmerson Shuffield, MD Neurology [phone] 04/10/2018, 2:44 PM

## 2018-04-10 NOTE — Progress Notes (Deleted)
HPI:   [name] is a 72 y.o. male, who is here today to follow on recent hospitalization. Admitted on 03/13/18 and discharged on 03/18/18 to a SNF [facility]. [facility] 03/28/18. He was discharged home 04/03/18.  Presented to the ER with confusion,fridgety, and not able to provide Hx in the ER.  Sepsis , multifocal/healthcare pneumonia. Treated with IV abx,vancomycin and Cefepime. Discharged on oral Vantin.  Acute metabolic encephalopathy *** HypoK+ and hypomg ***  Urinary retention: *** Discharged to SNF with permanent cath. Urology visit ***  Elevated troponin, EKG changes, NSTEMI. Cardiologist, Dr Karenann Cai.  Dysphagia 3 diet with nectar thick liquids.  Back to the ER on 04/08/18, admitted with UTI  Review of Systems    Current Facility-Administered Medications on File Prior to Visit  Medication Dose Route Frequency Provider Last Rate Last Dose  . 0.9 %  sodium chloride infusion   Intravenous PRN Briant Cedar, MD 10 mL/hr at 04/10/18 1414 250 mL at 04/10/18 1414  . acetaminophen (TYLENOL) suppository 650 mg  650 mg Rectal Q8H PRN Briant Cedar, MD   650 mg at 04/09/18 1905  . aspirin EC tablet 81 mg  81 mg Oral Daily Elder Love, MD   81 mg at 04/10/18 1218  . atorvastatin (LIPITOR) tablet 40 mg  40 mg Oral Daily Elder Love, MD   40 mg at 04/10/18 1218  . ceFEPIme (MAXIPIME) 1 g in sodium chloride 0.9 % 100 mL IVPB  1 g Intravenous Q24H Briant Cedar, MD 200 mL/hr at 04/10/18 1227 1 g at 04/10/18 1227  . DULoxetine (CYMBALTA) DR capsule 60 mg  60 mg Oral Daily Elder Love, MD   60 mg at 04/10/18 1217  . feeding supplement (GLUCERNA SHAKE) (GLUCERNA SHAKE) liquid 237 mL  237 mL Oral TID BM Briant Cedar, MD   237 mL at 04/10/18 2034  . heparin injection 5,000 Units  5,000 Units Subcutaneous Q8H Elder Love, MD   5,000 Units at 04/10/18 2034  . hydrALAZINE (APRESOLINE) injection 10 mg  10 mg Intravenous  Q8H PRN Briant Cedar, MD   10 mg at 04/10/18 0105  . insulin aspart (novoLOG) injection 0-9 Units  0-9 Units Subcutaneous Q4H Briant Cedar, MD   7 Units at 04/10/18 2034  . ipratropium-albuterol (DUONEB) 0.5-2.5 (3) MG/3ML nebulizer solution 3 mL  3 mL Nebulization Q6H PRN Elder Love, MD      . isosorbide mononitrate (IMDUR) 24 hr tablet 30 mg  30 mg Oral Daily Briant Cedar, MD   30 mg at 04/10/18 1701  . metoprolol tartrate (LOPRESSOR) tablet 50 mg  50 mg Oral BID Briant Cedar, MD   50 mg at 04/10/18 1701  . mometasone-formoterol (DULERA) 200-5 MCG/ACT inhaler 2 puff  2 puff Inhalation BID Elder Love, MD   2 puff at 04/10/18 2020  . sodium chloride flush (NS) 0.9 % injection 3 mL  3 mL Intravenous Q12H Elder Love, MD   3 mL at 04/10/18 2034  . vancomycin (VANCOCIN) IVPB 1000 mg/200 mL premix  1,000 mg Intravenous Q24H Briant Cedar, MD 200 mL/hr at 04/10/18 1441 1,000 mg at 04/10/18 1441  . vitamin B-12 (CYANOCOBALAMIN) tablet 1,000 mcg  1,000 mcg Oral Daily Elder Love, MD   1,000 mcg at 04/10/18 1216   Current Outpatient Medications on File Prior to Visit  Medication Sig Dispense Refill  . ACCU-CHEK SOFTCLIX LANCETS lancets  Use as instructed 100 each 12  . aspirin EC 81 MG tablet Take 1 tablet (81 mg total) by mouth daily. 90 tablet 3  . atorvastatin (LIPITOR) 40 MG tablet Take 1 tablet (40 mg total) by mouth daily. 90 tablet 3  . baclofen (LIORESAL) 10 MG tablet Take 1 tablet (10 mg total) by mouth 3 (three) times daily. 90 tablet 1  . budesonide-formoterol (SYMBICORT) 160-4.5 MCG/ACT inhaler Inhale 2 puffs into the lungs 2 (two) times daily. (Patient not taking: Reported on 04/08/2018) 1 Inhaler 0  . calcium carbonate (CALCIUM ANTACID) 500 MG chewable tablet Chew 1 tablet (200 mg of elemental calcium total) by mouth 2 (two) times daily. (Patient not taking: Reported on 04/08/2018) 60 tablet 0  . diclofenac (VOLTAREN) 50 MG  EC tablet Take 50 mg by mouth 3 (three) times daily.    . DULoxetine (CYMBALTA) 60 MG capsule TAKE 1 CAPSULE BY MOUTH EVERY DAY (Patient taking differently: Take 60 mg by mouth daily. ) 30 capsule 0  . folic acid (FOLVITE) 1 MG tablet Take 1 tablet (1 mg total) by mouth daily. (Patient not taking: Reported on 04/08/2018)    . gabapentin (NEURONTIN) 600 MG tablet Take 1 tablet (600 mg total) by mouth 3 (three) times daily. 90 tablet 0  . glucose blood (ONETOUCH VERIO) test strip Use to test blood sugar three-four times daily. 400 each 3  . insulin aspart (NOVOLOG) 100 UNIT/ML injection Inject 6 Units into the skin 3 (three) times daily with meals. (Patient not taking: Reported on 04/08/2018) 10 mL 0  . Insulin Glargine (LANTUS SOLOSTAR) 100 UNIT/ML Solostar Pen Inject 28 Units into the skin at bedtime. (Patient taking differently: Inject 20 Units into the skin at bedtime. ) 5 pen 0  . Insulin Syringe-Needle U-100 (INSULIN SYRINGE .5CC/31GX5/16") 31G X 5/16" 0.5 ML MISC USE AS DIRECTED WITH LANTUS 100 each 5  . ipratropium-albuterol (DUONEB) 0.5-2.5 (3) MG/3ML SOLN Take 3 mLs by nebulization every 6 (six) hours as needed. (Patient taking differently: Take 3 mLs by nebulization every 6 (six) hours as needed (wheezing). ) 360 mL 0  . isosorbide mononitrate (IMDUR) 30 MG 24 hr tablet Take 1 tablet (30 mg total) by mouth daily. (Patient not taking: Reported on 04/08/2018) 30 tablet 0  . loratadine (CLARITIN) 10 MG tablet Take 1 tablet (10 mg total) by mouth daily. (Patient not taking: Reported on 04/08/2018)    . metFORMIN (GLUCOPHAGE) 500 MG tablet Take 2 tablets (1,000 mg total) by mouth 2 (two) times daily with a meal. 120 tablet 4  . metoprolol tartrate (LOPRESSOR) 50 MG tablet Take 1 tablet (50 mg total) by mouth 2 (two) times daily. (Patient taking differently: Take 25 mg by mouth daily. ) 60 tablet 0  . Multiple Vitamin (MULTIVITAMIN WITH MINERALS) TABS tablet Take 1 tablet by mouth daily. (Patient not  taking: Reported on 04/08/2018)    . NONFORMULARY OR COMPOUNDED ITEM Apply 1 application topically 2 (two) times daily as needed (rash). 1:1 mixture of eucerin and triamcinolone 0.1% ointment    . polyethylene glycol (MIRALAX / GLYCOLAX) packet Take 17 g by mouth daily. (Patient taking differently: Take 17 g by mouth daily as needed for mild constipation. ) 14 each 0  . Potassium Chloride ER 20 MEQ TBCR Take 20 mEq by mouth daily. (Patient not taking: Reported on 04/08/2018) 60 tablet   . tamsulosin (FLOMAX) 0.4 MG CAPS capsule Take 1 capsule (0.4 mg total) by mouth daily after breakfast. NEED OV. (Patient not  taking: Reported on 04/08/2018) 30 capsule 0  . traMADol (ULTRAM) 50 MG tablet Take 50 mg by mouth every 6 (six) hours as needed for moderate pain.    Marland Kitchen triamcinolone ointment (KENALOG) 0.1 % Apply 1 application topically 2 (two) times daily as needed (rash). Compounded as a 1:1 mixture with Eucerin. (Patient not taking: Reported on 04/08/2018) 453 g 1  . vitamin B-12 (CYANOCOBALAMIN) 1000 MCG tablet Take 1,000 mcg by mouth daily.    . vitamin C (ASCORBIC ACID) 500 MG tablet Take 1 tablet (500 mg total) by mouth 2 (two) times daily. (Patient not taking: Reported on 04/08/2018) 60 tablet 0     Past Medical History:  Diagnosis Date  . ANKLE SPRAIN 12/16/2006   Qualifier: Diagnosis of  By: Tawanna Cooler MD, Eugenio Hoes   . ANXIETY 10/25/2006  . CELLULITIS, LEG, RIGHT 09/08/2007   Qualifier: Diagnosis of  By: Tawanna Cooler MD, Eugenio Hoes   . Chronic pain disorder 11/30/2015  . COPD 10/25/2006  . 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 DIABETES MELLITUS, TYPE I 10/25/2006  . Diabetic polyneuropathy (HCC) 04/08/2007   Qualifier: Diagnosis of  By: Tawanna Cooler MD, Eugenio Hoes   . 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 Essential hypertension 10/25/2006   Qualifier: Diagnosis of  By: Rosette Reveal RN, Jorene Minors   . Generalized osteoarthritis of  multiple sites 11/30/2015  . HYPERTENSION 10/25/2006  . 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 Low back pain with radiation 01/23/2016  . Polyneuropathy in diabetes(357.2) 04/08/2007  . Psoriasis   . TOBACCO ABUSE 07/08/2007  . VENOUS INSUFFICIENCY 11/25/2007   No Known Allergies  Social History   Socioeconomic History  . Marital status: Married    Spouse name: Not on file  . Number of children: Not on file  . Years of education: Not on file  . Highest education level: Not on file  Occupational History  . Occupation: Self Employed  Social Needs  . Financial resource strain: Not on file  . Food insecurity:    Worry: Not on file    Inability: Not on file  . Transportation needs:    Medical: Not on file    Non-medical: Not on file  Tobacco Use  . Smoking status: Current Every Day Smoker    Types: Pipe    Last attempt to quit: 05/29/2011    Years since quitting: 6.8  . Smokeless tobacco: Never Used  Substance and Sexual Activity  . Alcohol use: No  . Drug use: No  . Sexual activity: Not on file  Lifestyle  . Physical activity:    Days per week: Not on file    Minutes per session: Not on file  . Stress: Not on file  Relationships  . Social connections:    Talks on phone: Not on file    Gets together: Not on file    Attends religious service: Not on file    Active member of club or organization: Not on file    Attends meetings of clubs or organizations: Not on file    Relationship status: Not on file  Other Topics Concern  . Not on file  Social History Narrative   Regular exercise-no    There were no vitals filed for this visit. There is no height or weight on file to calculate BMI.      Physical  Exam  ASSESSMENT AND PLAN:  There are no diagnoses linked to this encounter.   No orders of the defined types were placed in this encounter.   There are no diagnoses linked to this encounter.  No  problem-specific Assessment & Plan notes found for this encounter.      Tandi Hanko G. [place], MD  [facility]. [place] office.

## 2018-04-10 NOTE — Progress Notes (Signed)
PROGRESS NOTE  KYVON HU NWG:[id number] DOB: 1945-08-31 DOA: 04/08/2018 PCP: [place], [name], MD  HPI/Recap of past 24 hours: HPI from Dr Rema Jasmine This is a 72 year old man with medical problems including insulin-dependent diabetes (A1c of 9.1% in 12/2017), hypertension, BPH with most recently urinary retention requiring Foley catheter placement, chronic low back pain, who presents from his home with confusion and decreased responsiveness. Significant recent events include hospital stay from October 17 through March 18, 2018 where acute problems included pneumonia, NSTEMI medically managed, urinary retention requiring placement of Foley, acute on chronic anemia requiring 2 units of packed red blood cells. Transferred to a skilled nursing facility for rehab, and was discharged home about 3 days ago.  History was obtained via discussion with the patient's spouse as the patient currently is confused. Upon arrival home from his rehab facility, he was walking with a 4 wheeled walker and needing some help with his ADLs.  The spouse reports 2 months ago he was independent in his ADLs and enjoyed doing work around the house. Prior to presentation, he was found to be lethargic and could not walk.  His spouse reports he slid out of his walker onto the ground, and was minimally responsive therefore prompting EMS call. In the ED, found to have leukocytosis with white count of 17.6, creatinine of 1.5, baseline creatinine appears to be less than 1.  Urinalysis with large leukocytes.  Chest x-ray without obvious new focal infiltrate. Pt admitted for further management.   Today, pt much more awake, alert, oriented to self and place only. Much improvement today since after vancomycin was added. Denies any new complaints  Assessment/Plan: Active Problems:   UTI (urinary tract infection)  Acute metabolic encephalopathy likely 2/2 sepsis from ??unstageable sacral ulcer, ?? Osteomyelitis Vs UTI Vs  Polypharmacy Last temp spike was 04/09/18, currently afebrile, tachycardic, with leukocytosis Lactic acid WNL Procalcitonin 0.52-->0.35 UA showed large leukocytes, >50 WBC UC with <10,000 colonies, insignificant growth BC x2 NGTD Chest x-ray shows improvement in left lower lobe pneumonia, atelectasis CT abdomen/pelvis: Bilateral kidney stones without obstruction, distended urinary bladder despite Foley catheter with bladder wall thickening.  Nonspecific pulmonary nodules, noncontrast chest CT 3 to 6 months is recommended CT head negative for any acute intracranial process MRI of the brain negative for any acute intracranial process EEG with low voltage, polymorphic delta activity that is diffusely distributed Continue to hold sedating medications Continue empiric IV antibiotics, as significant improvement with IV Vancomycin Continue telemetry for closer monitoring  Elevated d-dimer VQ scan very low probability for PE  Diabetes mellitus type 2 Episode of hypoglycemia on 04/09/18, now resolved Last A1c 9.1 on 12/2017 Stop D5W1/2 Continue SSI Hold home metformin  AKI Ongoing Baseline creatinine normal, on admission 1.5, likely prerenal Vs NSAID use Daily BMP  Multiple ulcers including unstageable sacral, stage I on left heel, stage II on right heel WOC nurse on board, appreciate recommendations MRI sacrum to rule out osteomyelitis Continue empiric IV antibiotics  Hypertension Uncontrolled Start home lopressor, imdur  Hyperlipidemia Continue statins  Normocytic anemia Hemoglobin at baseline, unclear etiology Daily CBC  BPH, with recent urinary retention Continue Foley    Code Status: Full  Family Communication: Spoke to wife and mother in law  Disposition Plan: Home once work-up is complete   Consultants:  None  Procedures:  None  Antimicrobials:  IV vancomycin  IV cefepime  DVT prophylaxis: Heparin   Objective: Vitals:   04/10/18 0500  04/10/18 0623 04/10/18 0953 04/10/18 1443  BP:  (!) 172/93 (!) 165/99 (!) 145/90  Pulse:   (!) 108 (!) 109  Resp:    18  Temp:   97.8 F (36.6 C) 98 F (36.7 C)  TempSrc:   Axillary Axillary  SpO2:   100% 100%  Weight: 70 kg     Height: 6' (1.829 m)       Intake/Output Summary (Last 24 hours) at 04/10/2018 1533 Last data filed at 04/10/2018 1400 Gross per 24 hour  Intake 2907.2 ml  Output 2050 ml  Net 857.2 ml   Filed Weights   04/09/18 1749 04/10/18 0500  Weight: 70.9 kg 70 kg    Exam:  General: NAD, now alert, awake, oriented to place, person  Cardiovascular: S1, S2 present  Respiratory: CTAB  Abdomen: Soft, nontender, nondistended, bowel sounds present  Musculoskeletal: No bilateral pedal edema noted  Skin:  Unstageable sacral ulcer, stage I left heel, stage II right heel  Psychiatry:  Still somewhat confused   Data Reviewed: CBC: Recent Labs  Lab 04/08/18 1604 04/09/18 0343 04/10/18 0526  WBC 17.6* 17.7* 16.9*  NEUTROABS 14.7*  --  13.8*  HGB 9.6* 10.0* 11.8*  HCT 30.4* 32.3* 37.5*  MCV 94.7 94.7 91.9  PLT 202 207 266   Basic Metabolic Panel: Recent Labs  Lab 04/08/18 1604 04/09/18 0343 04/10/18 0526  NA 139 141 141  K 4.1 4.1 3.8  CL 107 105 102  CO2 24 25 25   GLUCOSE 223* 85 198*  BUN 32* 32* 25*  CREATININE 1.50* 1.45* 1.53*  CALCIUM 7.7* 8.5* 8.7*   GFR: Estimated Creatinine Clearance: 43.2 mL/min (A) (by C-G formula based on SCr of 1.53 mg/dL (H)). Liver Function Tests: No results for input(s): AST, ALT, ALKPHOS, BILITOT, PROT, ALBUMIN in the last 168 hours. No results for input(s): LIPASE, AMYLASE in the last 168 hours. Recent Labs  Lab 04/08/18 1604  AMMONIA 15   Coagulation Profile: No results for input(s): INR, PROTIME in the last 168 hours. Cardiac Enzymes: Recent Labs  Lab 04/08/18 1604  CKTOTAL 48*   BNP (last 3 results) No results for input(s): PROBNP in the last 8760 hours. HbA1C: No results for input(s):  HGBA1C in the last 72 hours. CBG: Recent Labs  Lab 04/09/18 2024 04/09/18 2357 04/10/18 0439 04/10/18 0745 04/10/18 1119  GLUCAP 87 107* 149* 204* 230*   Lipid Profile: No results for input(s): CHOL, HDL, LDLCALC, TRIG, CHOLHDL, LDLDIRECT in the last 72 hours. Thyroid Function Tests: No results for input(s): TSH, T4TOTAL, FREET4, T3FREE, THYROIDAB in the last 72 hours. Anemia Panel: No results for input(s): VITAMINB12, FOLATE, FERRITIN, TIBC, IRON, RETICCTPCT in the last 72 hours. Urine analysis:    Component Value Date/Time   COLORURINE YELLOW 04/08/2018 1802   APPEARANCEUR CLOUDY (A) 04/08/2018 1802   LABSPEC 1.010 04/08/2018 1802   PHURINE 6.0 04/08/2018 1802   GLUCOSEU 50 (A) 04/08/2018 1802   GLUCOSEU NEGATIVE 09/30/2008 0851   HGBUR LARGE (A) 04/08/2018 1802   HGBUR trace-lysed 03/29/2010 0940   BILIRUBINUR NEGATIVE 04/08/2018 1802   BILIRUBINUR neg 01/13/2014 1141   KETONESUR NEGATIVE 04/08/2018 1802   PROTEINUR 100 (A) 04/08/2018 1802   UROBILINOGEN 1.0 01/13/2014 1141   UROBILINOGEN 0.2 03/29/2010 0940   NITRITE NEGATIVE 04/08/2018 1802   LEUKOCYTESUR LARGE (A) 04/08/2018 1802   Sepsis Labs: @LABRCNTIP (procalcitonin:4,lacticidven:4)  ) Recent Results (from the past 240 hour(s))  Urine culture     Status: Abnormal   Collection Time: 04/08/18  6:02 PM  Result Value Ref Range  Status   Specimen Description   Final    URINE, CATHETERIZED Performed at [facility], [address] [place], [place] [address]    Special Requests   Final    NONE Performed at  [facility], [address], [place], [place] [address]    Culture (A)  Final    <10,000 COLONIES/mL INSIGNIFICANT GROWTH Performed at [facility], [address] [place], [place] [address]    Report Status 04/10/2018 FINAL  Final  Culture, blood (routine x 2)     Status: None (Preliminary result)   Collection Time: 04/08/18 11:19 PM  Result Value Ref Range  Status   Specimen Description   Final    BLOOD RIGHT HAND Performed at [facility], [address], [place], [place] [address]    Special Requests   Final    BOTTLES DRAWN AEROBIC AND ANAEROBIC Blood Culture adequate volume Performed at [facility], [address] [place], [place] [address]    Culture   Final    NO GROWTH 1 DAY Performed at [facility], [address] [place], [place] [address]    Report Status PENDING  Incomplete  Culture, blood (routine x 2)     Status: None (Preliminary result)   Collection Time: 04/08/18 11:19 PM  Result Value Ref Range Status   Specimen Description   Final    BLOOD RIGHT FOREARM Performed at [facility], [address] [place], [place] [address]    Special Requests   Final    AEROBIC BOTTLE ONLY Blood Culture adequate volume Performed at [facility], [address] [place], [place] [address]    Culture   Final    NO GROWTH 1 DAY Performed at [facility], [address] [place], [place] [address]    Report Status PENDING  Incomplete      Studies: Ct Abdomen Pelvis Wo Contrast  Result Date: 04/10/2018 CLINICAL DATA:  72 year old with flank pain and recurrent stone disease. EXAM: CT ABDOMEN AND PELVIS WITHOUT CONTRAST TECHNIQUE: Multidetector CT imaging of the abdomen and pelvis was performed following the standard protocol without IV contrast. COMPARISON:  CT 01/13/2018 FINDINGS: Lower chest: Extensive coronary artery calcifications. Small bilateral pleural effusions. 6 mm nodule in the right lower lobe on sequence 6, image 8. Patchy airspace densities in both lower lobes and a small amount of disease in the right middle lobe. Small peripheral nodular densities in left lower lobe on sequence 6, image 9. Hepatobiliary: New trace perihepatic ascites. Small low-density structure in the anterior liver on sequence 2, image 32 is nonspecific but could represent a small cyst. No  significant gallbladder distension. Pancreas: Unremarkable. No pancreatic ductal dilatation or surrounding inflammatory changes. Spleen: Normal in size without focal abnormality. Adrenals/Urinary Tract: Adrenal glands are within normal limits. Least 2 punctate nonobstructive right kidney stones. Approximately 4 punctate left kidney stones without hydronephrosis. No evidence for ureter dilatation. No definite ureter stones. The urinary bladder is moderately distended with wall thickening measuring roughly 6 mm. There is a Foley catheter within the urinary bladder. There may be calcifications along the posterior wall of the urinary bladder on sequence 2, image 69. Kidneys are mildly lobular and similar to the prior examination. Stomach/Bowel: Stomach is unremarkable. There is no significant bowel dilatation and no evidence for obstruction. Limited evaluation for bowel inflammation on this noncontrast examination. Study also has technical limitations due to streak artifact from  the patient's arms and mild motion artifact. Vascular/Lymphatic: Large amount of calcium at the origin of the SMA. Large amount of calcium at the origin of the right renal artery. Atherosclerotic calcifications in the abdominal aorta without aneurysm. Calcifications in the bilateral femoral arteries. No significant lymph node enlargement in the abdomen or pelvis. Reproductive: Prostate calcifications. Other: Diffuse subcutaneous edema. Evidence for mesenteric edema. A small amount of ascites in the pelvis and around the liver. Musculoskeletal: Mild disc space narrowing at L5-S1. No suspicious bone findings. IMPRESSION: 1. Small bilateral kidney stones without obstruction. 2. Distended urinary bladder despite having a Foley catheter. Bladder wall thickening is nonspecific but could be related to chronic bladder outlet obstruction or inflammation. 3. Subcutaneous edema with a small amount of ascites. Findings could be related to fluid overload or  anasarca. 4. Small bilateral pleural effusions with patchy airspace and nodular disease at the lung bases. Findings are concerning for pneumonia or aspiration. 5. **An incidental finding of potential clinical significance has been found. Nonspecific pulmonary nodules. Largest pulmonary nodule measures 6 mm in the right lower lobe. Non-contrast chest CT at 3-6 months is recommended. If the nodules are stable at time of repeat CT, then future CT at 18-24 months (from today's scan) is considered optional for low-risk patients, but is recommended for high-risk patients. This recommendation follows the consensus statement: Guidelines for Management of Incidental Pulmonary Nodules Detected on CT Images: From the Fleischner Society 2017; Radiology 2017; 284:228-243.** 6. Aortic Atherosclerosis (ICD10-I70.0). Coronary artery calcifications. Electronically Signed   By: Richarda Overlie M.D.   On: 04/10/2018 09:35   Mr Laqueta Jean ZO Contrast  Result Date: 04/09/2018 CLINICAL DATA:  Altered mental status EXAM: MRI HEAD WITHOUT AND WITH CONTRAST TECHNIQUE: Multiplanar, multiecho pulse sequences of the brain and surrounding structures were obtained without and with intravenous contrast. CONTRAST:  7 mL Gadavist COMPARISON:  Head CT 04/09/2018 FINDINGS: BRAIN: There is no acute infarct, acute hemorrhage, hydrocephalus or extra-axial collection. The midline structures are normal. No midline shift or other mass effect. 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 Diffuse confluent hyperintense T2-weighted signal within the periventricular, deep and juxtacortical white matter, most commonly due to chronic ischemic microangiopathy. Advanced atrophy for age. Susceptibility-sensitive sequences show no chronic microhemorrhage or superficial siderosis. VASCULAR: Major intracranial arterial and venous sinus flow voids are normal. SKULL AND UPPER CERVICAL SPINE: Calvarial bone marrow signal is normal. There is no skull  base mass. Visualized upper cervical spine and soft tissues are normal. SINUSES/ORBITS: No fluid levels or advanced mucosal thickening. No mastoid or middle ear effusion. The orbits are normal. IMPRESSION: 1. No acute intracranial abnormality. 2. Multiple old small vessel infarcts and advanced findings of chronic ischemic microangiopathy. Electronically Signed   By: Deatra Robinson M.D.   On: 04/09/2018 19:18   Nm Pulmonary Perf And Vent  Result Date: 04/10/2018 CLINICAL DATA:  Shortness of breath with elevated D-dimer EXAM: NUCLEAR MEDICINE VENTILATION - PERFUSION LUNG SCAN VIEWS: Anterior, posterior, left lateral, right lateral, RPO, LPO, RAO, LAO-ventilation and perfusion RADIOPHARMACEUTICALS:  29.14 mCi of Tc-90m DTPA aerosol inhalation and 4.3 mCi Tc63m MAA IV COMPARISON:  Chest radiograph April 08, 2018 FINDINGS: Ventilation: Radiotracer uptake is homogeneous and symmetric bilaterally. No ventilation defects are evident. Perfusion: Radiotracer uptake is homogeneous and symmetric bilaterally. No perfusion defects are evident. IMPRESSION: No appreciable ventilation or perfusion defects. Very low probability of pulmonary embolus. Electronically Signed   By: Bretta Bang III M.D.   On: 04/10/2018 10:17  Scheduled Meds: . aspirin EC  81 mg Oral Daily  . atorvastatin  40 mg Oral Daily  . DULoxetine  60 mg Oral Daily  . feeding supplement (GLUCERNA SHAKE)  237 mL Oral TID BM  . heparin  5,000 Units Subcutaneous Q8H  . insulin aspart  0-9 Units Subcutaneous Q4H  . metoprolol tartrate  5 mg Intravenous Q12H  . mometasone-formoterol  2 puff Inhalation BID  . sodium chloride flush  3 mL Intravenous Q12H  . vitamin B-12  1,000 mcg Oral Daily    Continuous Infusions: . sodium chloride 250 mL (04/10/18 1414)  . ceFEPime (MAXIPIME) IV 1 g (04/10/18 1227)  . vancomycin 1,000 mg (04/10/18 1441)     LOS: 1 day     Briant Cedar, MD [facility]   If 7PM-7AM, please contact  night-coverage [email or web address] 04/10/2018, 3:33 PM

## 2018-04-10 NOTE — Progress Notes (Signed)
Irrigated foley per MD orders. Irrigated with 60ml NS twice. Clear, pale yellow urine returned each time. Foley was had drainage in the bag and the tube prior to irrigation. After irrigation, the urine did appear to flow more quickly. 300cc or urine emptied within about 15 minutes after irrigation. Will continue to monitor output.

## 2018-04-10 NOTE — Evaluation (Signed)
Clinical/Bedside Swallow Evaluation Patient Details  Name: Jeremy Gill MRN: [id number] Date of Birth: February 07, 1946  Today's Date: 04/10/2018 Time: SLP Start Time (ACUTE ONLY): 1030 SLP Stop Time (ACUTE ONLY): 1131 SLP Time Calculation (min) (ACUTE ONLY): 61 min  Past Medical History:  Past Medical History:  Diagnosis Date  . ANKLE SPRAIN 12/16/2006   Qualifier: Diagnosis of  By: Tawanna Coolerodd MD, Eugenio HoesJeffrey A   . ANXIETY 10/25/2006  . CELLULITIS, LEG, RIGHT 09/08/2007   Qualifier: Diagnosis of  By: Tawanna Coolerodd MD, Eugenio HoesJeffrey A   . Chronic pain disorder 11/30/2015  . COPD 10/25/2006  . 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 DIABETES MELLITUS, TYPE I 10/25/2006  . Diabetic polyneuropathy (HCC) 04/08/2007   Qualifier: Diagnosis of  By: Tawanna Coolerodd MD, Eugenio HoesJeffrey A   . 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 Essential hypertension 10/25/2006   Qualifier: Diagnosis of  By: Rosette Revealempleton, RN, Jorene MinorsAngela Dawn   . Generalized osteoarthritis of multiple sites 11/30/2015  . HYPERTENSION 10/25/2006  . 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 Low back pain with radiation 01/23/2016  . Polyneuropathy in diabetes(357.2) 04/08/2007  . Psoriasis   . TOBACCO ABUSE 07/08/2007  . VENOUS INSUFFICIENCY 11/25/2007   Past Surgical History:  Past Surgical History:  Procedure Laterality Date  . TESTICLE REMOVAL     R testicle   HPI:  72 yo [facility] - remained lethargic yesterday and underwent brain imaging that is negative. PMH + for HTN, DM, recent pna with hospitalization and rehab stay, chronic back pain.  Pt recently was dc'd home from rehab - and functioned well until Tuesday per daughter [name].  Pt CT head negative.  Swallow evaluation ordered.  Pt has MBS during past hospitalization revealing moderately severe dysphagia with sensorimotor deficits with recommendation for soft/nectar diet and  follow up.  Per daughter, pt was able to advance to regular/thin diet at SNF before going home and was tolerating well.  Per CT of abdomen, pt with findings concerning for aspiration or pna.     Assessment / Plan / Recommendation Clinical Impression  Pt's today are consistent with prior evaluation results.   Suspect cognitive and motor planning deficits impacting swallow.  Pt with difficulties following directions, ? some oral motor planning difficulties.  Pt required extensive oral care today due to dried secretions retained in oral cavity.  No overt indication of aspiration today with intake of water, applesauce, graham cracker.  Pt self fed with assistance.  He is slow to swallow and orally manipulate.  Throat clearing noted with liquids which he consumes in sequential boluses.  Of note, pt did sense aspiration onf prior MBS and this caused him to cough.  Therefore recommend follow him clinically for swallowing unless deemed necessary for MBS.  Advised pt/family to recommendations to mitigate aspiration/dysphagia and advised suspicion for episodic aspiration.  Family agreeable to plan.  Would recommend a palliative referral given pt multiple admits in the last six months.  Skilled intervention included educating pt/family to prior MBS, aspiration precautions.  Will follow up.  SLP Visit Diagnosis: Dysphagia, oropharyngeal phase (R13.12)    Aspiration Risk  Moderate aspiration risk    Diet Recommendation Dysphagia 3 (Mech soft);Thin liquid   Liquid Administration via: Cup;Straw Medication Administration: Whole meds with puree Supervision: Full supervision/cueing for compensatory strategies Compensations: Minimize environmental distractions;Slow rate;Small sips/bites;Follow solids  with liquid Postural Changes: Remain upright for at least 30 minutes after po intake;Seated upright at 90 degrees    Other  Recommendations Oral Care Recommendations: Oral care BID   Follow up Recommendations (tbd)       Frequency and Duration min 1 x/week  1 week       Prognosis Prognosis for Safe Diet Advancement: Fair Barriers to Reach Goals: Time post onset;Cognitive deficits      Swallow Study   General Date of Onset: 04/10/18 HPI: 73 yo male adm to [facility] with AMS - remained lethargic yesterday and underwent brain imaging that is negative. PMH + for HTN, DM, recent pna with hospitalization and rehab stay, chronic back pain.  Pt recently was dc'd home from rehab - and functioned well until Tuesday per daughter Rosann Auerbach.  Pt CT head negative.  Swallow evaluation ordered.  Pt has MBS during past hospitalization revealing moderately severe dysphagia with sensorimotor deficits with recommendation for soft/nectar diet and follow up.  Per daughter, pt was able to advance to regular/thin diet at SNF before going home and was tolerating well.  Per CT of abdomen, pt with findings concerning for aspiration or pna.   Type of Study: Bedside Swallow Evaluation Diet Prior to this Study: NPO Temperature Spikes Noted: No Respiratory Status: Nasal cannula History of Recent Intubation: No Behavior/Cognition: Alert;Cooperative;Pleasant mood Oral Cavity Assessment: Dried secretions;Dry Oral Care Completed by SLP: Yes(extensive oral care required approximately 20 minutes to clear) Oral Cavity - Dentition: Adequate natural dentition Vision: Functional for self-feeding Self-Feeding Abilities: Needs assist Patient Positioning: Upright in bed Baseline Vocal Quality: Low vocal intensity Volitional Cough: Strong Volitional Swallow: Unable to elicit    Oral/Motor/Sensory Function Overall Oral Motor/Sensory Function: Generalized oral weakness(left facial asymmetry - noted last time but family states pt did not have bell's palsy) Facial ROM: Reduced left Facial Symmetry: Abnormal symmetry right   Ice Chips Ice chips: Within functional limits Presentation: Spoon   Thin Liquid Thin Liquid: Within functional  limits Presentation: Straw;Self Fed Pharyngeal  Phase Impairments: Multiple swallows;Throat Clearing - Delayed    Nectar Thick Nectar Thick Liquid: Not tested   Honey Thick Honey Thick Liquid: Not tested   Puree Puree: Within functional limits Presentation: Self Fed;Spoon   Solid     Solid: Impaired Presentation: Self Fed Oral Phase Impairments: Reduced lingual movement/coordination;Impaired mastication Oral Phase Functional Implications: Impaired mastication Pharyngeal Phase Impairments: Suspected delayed Swallow      Chales Abrahams 04/10/2018,11:53 AM  Donavan Burnet, MS [facility] SLP Acute Rehab Services Pager [phone] Office [phone]

## 2018-04-10 NOTE — Consult Note (Signed)
   [facility]   04/10/2018  Jeremy SalinesJames E Pennel 1946-02-21 [id number]    [facility]. However, he was [facility] at that [facility].  Active [facility] written consent remains on file.  Went to bedside to speak with patient and wife Jeremy Gill at bedside. Jeremy Gill states they remain agreeable to  [facility] services. Discussed that [facility] will not interfere or replace services provided by home health.  Provided another [facility] brochure with contact information.  Discussed with inpatient RNCM that [facility] will follow post hospital discharge.  Will continue to follow along and make appropriate [facility] referrals once disposition is known.  Jeremy Gill has history of BPH, HTN, UTI, NSTEMI, pneumonia, DM.    Raiford NobleAtika Axil Copeman, MSN-Ed, RN,BSN [facility] Hospital Liaison [phone]

## 2018-04-11 ENCOUNTER — Ambulatory Visit: Payer: PPO | Admitting: Family Medicine

## 2018-04-11 DIAGNOSIS — M549 Dorsalgia, unspecified: Secondary | ICD-10-CM

## 2018-04-11 DIAGNOSIS — I10 Essential (primary) hypertension: Secondary | ICD-10-CM

## 2018-04-11 DIAGNOSIS — N179 Acute kidney failure, unspecified: Secondary | ICD-10-CM

## 2018-04-11 DIAGNOSIS — G8929 Other chronic pain: Secondary | ICD-10-CM

## 2018-04-11 DIAGNOSIS — L89150 Pressure ulcer of sacral region, unstageable: Secondary | ICD-10-CM

## 2018-04-11 LAB — CBC WITH DIFFERENTIAL/PLATELET
Abs Immature Granulocytes: 0.06 10*3/uL (ref 0.00–0.07)
Basophils Absolute: 0 10*3/uL (ref 0.0–0.1)
Basophils Relative: 0 %
Eosinophils Absolute: 0.2 10*3/uL (ref 0.0–0.5)
Eosinophils Relative: 2 %
HEMATOCRIT: 31.2 % — AB (ref 39.0–52.0)
HEMOGLOBIN: 10 g/dL — AB (ref 13.0–17.0)
Immature Granulocytes: 1 %
LYMPHS ABS: 2.2 10*3/uL (ref 0.7–4.0)
Lymphocytes Relative: 18 %
MCH: 29.1 pg (ref 26.0–34.0)
MCHC: 32.1 g/dL (ref 30.0–36.0)
MCV: 90.7 fL (ref 80.0–100.0)
MONO ABS: 0.9 10*3/uL (ref 0.1–1.0)
MONOS PCT: 7 %
Neutro Abs: 8.8 10*3/uL — ABNORMAL HIGH (ref 1.7–7.7)
Neutrophils Relative %: 72 %
Platelets: 249 10*3/uL (ref 150–400)
RBC: 3.44 MIL/uL — ABNORMAL LOW (ref 4.22–5.81)
RDW: 13.5 % (ref 11.5–15.5)
WBC: 12.2 10*3/uL — ABNORMAL HIGH (ref 4.0–10.5)
nRBC: 0 % (ref 0.0–0.2)

## 2018-04-11 LAB — BASIC METABOLIC PANEL
Anion gap: 10 (ref 5–15)
BUN: 30 mg/dL — AB (ref 8–23)
CHLORIDE: 102 mmol/L (ref 98–111)
CO2: 26 mmol/L (ref 22–32)
Calcium: 8.4 mg/dL — ABNORMAL LOW (ref 8.9–10.3)
Creatinine, Ser: 1.48 mg/dL — ABNORMAL HIGH (ref 0.61–1.24)
GFR calc Af Amer: 53 mL/min — ABNORMAL LOW (ref >60)
GFR calc non Af Amer: 46 mL/min — ABNORMAL LOW (ref >60)
Glucose, Bld: 294 mg/dL — ABNORMAL HIGH (ref 70–99)
Potassium: 3.9 mmol/L (ref 3.5–5.1)
Sodium: 138 mmol/L (ref 135–145)

## 2018-04-11 LAB — GLUCOSE, CAPILLARY
GLUCOSE-CAPILLARY: 247 mg/dL — AB (ref 70–99)
Glucose-Capillary: 257 mg/dL — ABNORMAL HIGH (ref 70–99)
Glucose-Capillary: 259 mg/dL — ABNORMAL HIGH (ref 70–99)
Glucose-Capillary: 312 mg/dL — ABNORMAL HIGH (ref 70–99)
Glucose-Capillary: 374 mg/dL — ABNORMAL HIGH (ref 70–99)

## 2018-04-11 LAB — PROCALCITONIN: PROCALCITONIN: 0.36 ng/mL

## 2018-04-11 MED ORDER — BACLOFEN 10 MG PO TABS
10.0000 mg | ORAL_TABLET | Freq: Three times a day (TID) | ORAL | Status: DC
  Administered 2018-04-11 – 2018-04-12 (×3): 10 mg via ORAL
  Filled 2018-04-11 (×3): qty 1

## 2018-04-11 MED ORDER — GABAPENTIN 300 MG PO CAPS
300.0000 mg | ORAL_CAPSULE | Freq: Two times a day (BID) | ORAL | Status: DC
  Administered 2018-04-11 – 2018-04-12 (×3): 300 mg via ORAL
  Filled 2018-04-11 (×3): qty 1

## 2018-04-11 MED ORDER — INSULIN GLARGINE 100 UNIT/ML ~~LOC~~ SOLN
15.0000 [IU] | Freq: Every day | SUBCUTANEOUS | Status: DC
  Administered 2018-04-11: 15 [IU] via SUBCUTANEOUS
  Filled 2018-04-11: qty 0.15

## 2018-04-11 MED ORDER — SODIUM CHLORIDE 0.9 % IV SOLN
1.0000 g | Freq: Two times a day (BID) | INTRAVENOUS | Status: DC
  Administered 2018-04-11 – 2018-04-15 (×9): 1 g via INTRAVENOUS
  Filled 2018-04-11 (×10): qty 1

## 2018-04-11 MED ORDER — INSULIN ASPART 100 UNIT/ML ~~LOC~~ SOLN: 5.0000 [IU] | Freq: Three times a day (TID) | SUBCUTANEOUS | Status: DC

## 2018-04-11 MED ORDER — INSULIN ASPART 100 UNIT/ML ~~LOC~~ SOLN
0.0000 [IU] | Freq: Every day | SUBCUTANEOUS | Status: DC
  Administered 2018-04-11: 5 [IU] via SUBCUTANEOUS
  Administered 2018-04-13 – 2018-04-14 (×2): 3 [IU] via SUBCUTANEOUS

## 2018-04-11 MED ORDER — INSULIN ASPART 100 UNIT/ML ~~LOC~~ SOLN
3.0000 [IU] | Freq: Three times a day (TID) | SUBCUTANEOUS | Status: DC
  Administered 2018-04-11: 3 [IU] via SUBCUTANEOUS

## 2018-04-11 MED ORDER — INSULIN ASPART 100 UNIT/ML ~~LOC~~ SOLN: 3.0000 [IU] | Freq: Three times a day (TID) | SUBCUTANEOUS | Status: DC

## 2018-04-11 MED ORDER — INSULIN ASPART 100 UNIT/ML ~~LOC~~ SOLN
0.0000 [IU] | Freq: Three times a day (TID) | SUBCUTANEOUS | Status: DC
  Administered 2018-04-11: 3 [IU] via SUBCUTANEOUS
  Administered 2018-04-11: 5 [IU] via SUBCUTANEOUS
  Administered 2018-04-11: 3 [IU] via SUBCUTANEOUS

## 2018-04-11 MED ORDER — GABAPENTIN 600 MG PO TABS: 600.0000 mg | ORAL_TABLET | Freq: Three times a day (TID) | ORAL | Status: DC

## 2018-04-11 NOTE — Progress Notes (Signed)
Inpatient Diabetes Program Recommendations  AACE/ADA: New Consensus Statement on Inpatient Glycemic Control (2015)  Target Ranges:  Prepandial:   less than 140 mg/dL      Peak postprandial:   less than 180 mg/dL (1-2 hours)      Critically ill patients:  140 - 180 mg/dL   Lab Results  Component Value Date   GLUCAP 257 (H) 04/11/2018   HGBA1C 9.1 (H) 01/14/2018    Review of Glycemic Control  Diabetes history: DM2 Outpatient Diabetes medications: Lantus 20 units QHS, Novolog 6 units tidwc (not taking), metformin 1000 mg bid Current orders for Inpatient glycemic control: Lantus 15 units QHS, Novolog 0-9 units tidwc and hs + 3 units tidwc   Inpatient Diabetes Program Recommendations:     Agree with orders Need updated HgbA1C to assess glycemic control prior to admisison.  Will continue to follow.  Thank you. Ailene Ardshonda Geselle Cardosa, RD, LDN, CDE Inpatient Diabetes Coordinator [phone]

## 2018-04-11 NOTE — Evaluation (Signed)
Physical Therapy Evaluation Patient Details Name: Jeremy Gill MRN: [id number] DOB: Jun 29, 1945 Today's Date: 04/11/2018   History of Present Illness  72 year old man with medical problems including insulin-dependent diabetes (A1c of 9.1% in 12/2017), hypertension, BPH with most recently urinary retention requiring Foley catheter placement, chronic low back pain and admitted for acute metabolic encephalopathy likely 2/2 sepsis from unknown source (work up pending)  Clinical Impression  Pt admitted with above diagnosis. Pt currently with functional limitations due to the deficits listed below (see PT Problem List).  Pt will benefit from skilled PT to increase their independence and safety with mobility to allow discharge to the venue listed below.   Pt presents with generalized weakness, poor mobility requiring increased assist, decreased balance, and requiring supplemental oxygen at this time.  Pt poor historian.  Recommend d/c to SNF.     Follow Up Recommendations SNF    Equipment Recommendations  None recommended by PT    Recommendations for Other Services       Precautions / Restrictions Precautions Precautions: Fall Precaution Comments: currently on oxygen      Mobility  Bed Mobility Overal bed mobility: Needs Assistance Bed Mobility: Supine to Sit     Supine to sit: Mod assist;HOB elevated     General bed mobility comments: pt required increased time, able to initiate L LE over EOB however assist required for R LE and upper body. Multimodal cueing required. Utilized bedpad to aid with positioning,scooting.   Transfers Overall transfer level: Needs assistance Equipment used: Rolling walker (2 wheeled) Transfers: Sit to/from [employer] Sit to Stand: Mod assist;From elevated surface Stand pivot transfers: Mod assist       General transfer comment: verbal cues for technique, assist to rise, steady and control descent, +2 required for safety, pt with  difficulty advancing R LE requiring assist for placement  Ambulation/Gait                Stairs            Wheelchair Mobility    Modified Rankin (Stroke Patients Only)       Balance Overall balance assessment: Needs assistance;History of Falls         Standing balance support: Bilateral upper extremity supported;During functional activity Standing balance-Leahy Scale: Zero                               Pertinent Vitals/Pain Pain Assessment: Faces Faces Pain Scale: Hurts little more Pain Location: back Pain Descriptors / Indicators: Aching Pain Intervention(s): Repositioned;Monitored during session    Home Living Family/patient expects to be discharged to:: Unsure Living Arrangements: Spouse/significant other Available Help at Discharge: Family Type of Home: House Home Access: Stairs to enter Entrance Stairs-Rails: None Entrance Stairs-Number of Steps: 2 Home Layout: One level Home Equipment: Cane - single point Additional Comments: mother in law home with them.  Information is based on last admission. pt poor historian at this time    Prior Function Level of Independence: Needs assistance   Gait / Transfers Assistance Needed: recently back home after d/c from SNF for rehab     Comments: mod I approx 2 months ago     Hand Dominance        Extremity/Trunk Assessment   Upper Extremity Assessment Upper Extremity Assessment: Generalized weakness    Lower Extremity Assessment Lower Extremity Assessment: Generalized weakness    Cervical / Trunk Assessment Cervical / Trunk Assessment: Kyphotic  Communication   Communication: No difficulties  Cognition Arousal/Alertness: Awake/alert Behavior During Therapy: WFL for tasks assessed/performed Overall Cognitive Status: No family/caregiver present to determine baseline cognitive functioning                                 General Comments: pt with minimal  verbalizations, follows simple commands, hx of cognitive impairments      General Comments      Exercises     Assessment/Plan    PT Assessment Patient needs continued PT services  PT Problem List Decreased strength;Decreased range of motion;Decreased balance;Decreased mobility;Decreased activity tolerance;Decreased knowledge of use of DME;Decreased cognition;Decreased safety awareness       PT Treatment Interventions DME instruction;Gait training;Functional mobility training;Therapeutic activities;Balance training;Patient/family education;Therapeutic exercise    PT Goals (Current goals can be found in the Care Plan section)  Acute Rehab PT Goals PT Goal Formulation: Patient unable to participate in goal setting Time For Goal Achievement: 04/25/18 Potential to Achieve Goals: Fair    Frequency Min 2X/week   Barriers to discharge        Co-evaluation               AM-PAC PT "6 Clicks" Daily Activity  Outcome Measure Difficulty turning over in bed (including adjusting bedclothes, sheets and blankets)?: Unable Difficulty moving from lying on back to sitting on the side of the bed? : Unable Difficulty sitting down on and standing up from a chair with arms (e.g., wheelchair, bedside commode, etc,.)?: Unable Help needed moving to and from a bed to chair (including a wheelchair)?: Total Help needed walking in hospital room?: Total Help needed climbing 3-5 steps with a railing? : Total 6 Click Score: 6    End of Session Equipment Utilized During Treatment: Gait belt;Oxygen Activity Tolerance: Patient tolerated treatment well Patient left: with call bell/phone within reach;in chair;with chair alarm set Nurse Communication: Mobility status PT Visit Diagnosis: Muscle weakness (generalized) (M62.81);Unsteadiness on feet (R26.81);Difficulty in walking, not elsewhere classified (R26.2)    Time: [id number] PT Time Calculation (min) (ACUTE ONLY): 22 min   Charges:   PT  Evaluation $PT Eval Moderate Complexity: 1 Mod         Zenovia JarredKati Kilah Drahos, PT, DPT Acute Rehabilitation Services Office: [phone] Pager: [phone]  Sarajane JewsLEMYRE,KATHrine E 04/11/2018, 1:43 PM

## 2018-04-11 NOTE — Plan of Care (Signed)
Patient up to chair today with x2 assist.

## 2018-04-11 NOTE — Progress Notes (Signed)
Pharmacy Antibiotic Note  Jeremy Gill is a 72 y.o. male with hx urinary retention requiring foley cath and sacral/right heel ulcers presented to the on 04/08/2018 with AMS. Broad abx with vancomycin and cefepime were started on admission for suspected sepsis.  - 11/14 sacrum MRI: no sacral or coccygeal Osteomyelitis - 11/14 abd CT: concerning for PNA or aspiration; pulmonary nodule   Today, 04/11/2018: - afeb, wbc 12.2 - scr labile, 1.48 (crcl~45)   Plan: - continue vancomycin 1000 mg IV q24h - adjust cefepime dose to 1gm IV q12h - f/u renal function and cultures  _________________________________  Height: 6' (182.9 cm) Weight: 154 lb 1.6 oz (69.9 kg) IBW/kg (Calculated) : 77.6  Temp (24hrs), Avg:98.2 F (36.8 C), Min:97.8 F (36.6 C), Max:98.7 F (37.1 C)  Recent Labs  Lab 04/08/18 1604 04/09/18 0343 04/09/18 1548 04/09/18 2056 04/10/18 0526 04/11/18 0537  WBC 17.6* 17.7*  --   --  16.9* 12.2*  CREATININE 1.50* 1.45*  --   --  1.53* 1.48*  LATICACIDVEN  --   --  0.7 0.7  --   --     Estimated Creatinine Clearance: 44.6 mL/min (A) (by C-G formula based on SCr of 1.48 mg/dL (H)).    No Known Allergies  Antimicrobials this admission: 11/12 CTX x 1 11/13 vancomycin >>  11/13 cefepime >>      Microbiology results: 11/12 BCx x2: 11/12 UCx:  <10k colonies, insignificant growth FINAL  Thank you for allowing pharmacy to be a part of this patient's care.  Lucia Gaskinsham, Ellyanna Holton P 04/11/2018 9:11 AM

## 2018-04-11 NOTE — Progress Notes (Signed)
Dressing to sacrum changed per order.  Patient tolerated well.

## 2018-04-11 NOTE — Progress Notes (Signed)
PROGRESS NOTE  Jeremy SalinesJames E Lei ZOX:[id number] DOB: May 17, 1946 DOA: 04/08/2018 PCP: [place], [name], MD  HPI/Recap of past 24 hours: HPI from Dr Rema JasmineKuhlman This is a 72 year old man with medical problems including insulin-dependent diabetes (A1c of 9.1% in 12/2017), hypertension, BPH with most recently urinary retention requiring Foley catheter placement, chronic low back pain, who presents from his home with confusion and decreased responsiveness. Significant recent events include hospital stay from October 17 through March 18, 2018 where acute problems included pneumonia, NSTEMI medically managed, urinary retention requiring placement of Foley, acute on chronic anemia requiring 2 units of packed red blood cells. Transferred to a skilled nursing facility for rehab, and was discharged home about 3 days ago.  History was obtained via discussion with the patient's spouse as the patient currently is confused. Upon arrival home from his rehab facility, he was walking with a 4 wheeled walker and needing some help with his ADLs.  The spouse reports 2 months ago he was independent in his ADLs and enjoyed doing work around the house. Prior to presentation, he was found to be lethargic and could not walk.  His spouse reports he slid out of his walker onto the ground, and was minimally responsive therefore prompting EMS call. In the ED, found to have leukocytosis with white count of 17.6, creatinine of 1.5, baseline creatinine appears to be less than 1.  Urinalysis with large leukocytes.  Chest x-ray without obvious new focal infiltrate. Pt admitted for further management.   Today, pt denies any new complaints, only chronic back pain. Denies any chest pain, SOB, abdominal pain, fever/chills.  Assessment/Plan: Active Problems:   UTI (urinary tract infection)  Acute metabolic encephalopathy likely 2/2 sepsis from ??unstageable sacral ulcer Vs UTI Vs Polypharmacy Last temp spike was 04/09/18, currently afebrile,  with resolving leukocytosis Lactic acid WNL Procalcitonin 0.52-->0.35-->0.36 UA showed large leukocytes, >50 WBC UC with <10,000 colonies, insignificant growth BC x2 NGTD Chest x-ray shows improvement in left lower lobe pneumonia, atelectasis CT abdomen/pelvis: Bilateral kidney stones without obstruction, distended urinary bladder despite Foley catheter with bladder wall thickening.  Nonspecific pulmonary nodules, noncontrast chest CT 3 to 6 months is recommended CT head negative for any acute intracranial process MRI of the brain negative for any acute intracranial process EEG with low voltage, polymorphic delta activity that is diffusely distributed Continue empiric IV antibiotics, as significant improvement with IV Vancomycin Monitor closely  Elevated d-dimer VQ scan very low probability for PE  Diabetes mellitus type 2 Episode of hypoglycemia on 04/09/18, now with hyperglycemia Last A1c 9.1 on 12/2017, will repeat A1c Continue SSI, start novolog TID with meal and QHS, lantus 15U QHS Hold home metformin  AKI Ongoing Baseline creatinine normal, on admission 1.5, likely prerenal Vs NSAID/medication induced Daily BMP  Multiple ulcers including unstageable sacral, stage I on left heel, stage II on right heel WOC nurse on board, appreciate recommendations MRI sacrum ruled out osteomyelitis Continue empiric IV antibiotics  Hypertension Uncontrolled Continue home lopressor, imdur, hydralazine prn  Hyperlipidemia Continue statins  Normocytic anemia Hemoglobin at baseline, unclear etiology Daily CBC  BPH, with recent urinary retention Continue Foley, plan to wean off once AKI improves significantly  Chronic back pain Restart home flexeril, gabapentin at a lower dose     Code Status: Full  Family Communication: Spoke to wife and mother in law  Disposition Plan: SNF once work-up is complete and significantly  improved   Consultants:  None  Procedures:  None  Antimicrobials:  IV vancomycin  IV cefepime  DVT prophylaxis: Heparin   Objective: Vitals:   04/11/18 0716 04/11/18 1040 04/11/18 1041 04/11/18 1346  BP:  (!) 170/98    Pulse: (!) 102 (!) 107 (!) 108 87  Resp: 16   16  Temp:    98.4 F (36.9 C)  TempSrc:    Oral  SpO2: 99% 100% 100% 100%  Weight:      Height:        Intake/Output Summary (Last 24 hours) at 04/11/2018 1738 Last data filed at 04/11/2018 1600 Gross per 24 hour  Intake 1242.16 ml  Output 1400 ml  Net -157.84 ml   Filed Weights   04/09/18 1749 04/10/18 0500 04/11/18 0442  Weight: 70.9 kg 70 kg 69.9 kg    Exam:  General: NAD, awake, alert   Cardiovascular: S1, S2 present  Respiratory: CTAB  Abdomen: Soft, nontender, nondistended, bowel sounds present  Musculoskeletal: No bilateral pedal edema noted  Skin: Unstageable sacral ulcer, stage 1 Left heel, stage 2 right heel  Psychiatry: Normal mood   Data Reviewed: CBC: Recent Labs  Lab 04/08/18 1604 04/09/18 0343 04/10/18 0526 04/11/18 0537  WBC 17.6* 17.7* 16.9* 12.2*  NEUTROABS 14.7*  --  13.8* 8.8*  HGB 9.6* 10.0* 11.8* 10.0*  HCT 30.4* 32.3* 37.5* 31.2*  MCV 94.7 94.7 91.9 90.7  PLT 202 207 266 249   Basic Metabolic Panel: Recent Labs  Lab 04/08/18 1604 04/09/18 0343 04/10/18 0526 04/11/18 0537  NA 139 141 141 138  K 4.1 4.1 3.8 3.9  CL 107 105 102 102  CO2 24 25 25 26   GLUCOSE 223* 85 198* 294*  BUN 32* 32* 25* 30*  CREATININE 1.50* 1.45* 1.53* 1.48*  CALCIUM 7.7* 8.5* 8.7* 8.4*   GFR: Estimated Creatinine Clearance: 44.6 mL/min (A) (by C-G formula based on SCr of 1.48 mg/dL (H)). Liver Function Tests: No results for input(s): AST, ALT, ALKPHOS, BILITOT, PROT, ALBUMIN in the last 168 hours. No results for input(s): LIPASE, AMYLASE in the last 168 hours. Recent Labs  Lab 04/08/18 1604  AMMONIA 15   Coagulation Profile: No results for input(s): INR,  PROTIME in the last 168 hours. Cardiac Enzymes: Recent Labs  Lab 04/08/18 1604  CKTOTAL 48*   BNP (last 3 results) No results for input(s): PROBNP in the last 8760 hours. HbA1C: No results for input(s): HGBA1C in the last 72 hours. CBG: Recent Labs  Lab 04/11/18 0009 04/11/18 0439 04/11/18 0734 04/11/18 1127 04/11/18 1704  GLUCAP 312* 259* 247* 257* 374*   Lipid Profile: No results for input(s): CHOL, HDL, LDLCALC, TRIG, CHOLHDL, LDLDIRECT in the last 72 hours. Thyroid Function Tests: No results for input(s): TSH, T4TOTAL, FREET4, T3FREE, THYROIDAB in the last 72 hours. Anemia Panel: No results for input(s): VITAMINB12, FOLATE, FERRITIN, TIBC, IRON, RETICCTPCT in the last 72 hours. Urine analysis:    Component Value Date/Time   COLORURINE YELLOW 04/08/2018 1802   APPEARANCEUR CLOUDY (A) 04/08/2018 1802   LABSPEC 1.010 04/08/2018 1802   PHURINE 6.0 04/08/2018 1802   GLUCOSEU 50 (A) 04/08/2018 1802   GLUCOSEU NEGATIVE 09/30/2008 0851   HGBUR LARGE (A) 04/08/2018 1802   HGBUR trace-lysed 03/29/2010 0940   BILIRUBINUR NEGATIVE 04/08/2018 1802   BILIRUBINUR neg 01/13/2014 1141   KETONESUR NEGATIVE 04/08/2018 1802   PROTEINUR 100 (A) 04/08/2018 1802   UROBILINOGEN 1.0 01/13/2014 1141   UROBILINOGEN 0.2 03/29/2010 0940   NITRITE NEGATIVE 04/08/2018 1802   LEUKOCYTESUR LARGE (A) 04/08/2018 1802   Sepsis Labs: @LABRCNTIP (procalcitonin:4,lacticidven:4)  )  Recent Results (from the past 240 hour(s))  Urine culture     Status: Abnormal   Collection Time: 04/08/18  6:02 PM  Result Value Ref Range Status   Specimen Description   Final    URINE, CATHETERIZED Performed at [facility], [address], [place], [place] [address]    Special Requests   Final    NONE Performed at [facility], [address], [place], [place] [address]    Culture (A)  Final    <10,000 COLONIES/mL INSIGNIFICANT GROWTH Performed at [facility], [address] [place], [place] [address]    Report Status 04/10/2018 FINAL  Final  Culture, blood (routine x 2)     Status: None (Preliminary result)   Collection Time: 04/08/18 11:19 PM  Result Value Ref Range Status   Specimen Description   Final    BLOOD RIGHT HAND Performed at [facility], [address] [place], [place] [address]    Special Requests   Final    BOTTLES DRAWN AEROBIC AND ANAEROBIC Blood Culture adequate volume Performed at [facility], [address], [place], [place] [address]    Culture   Final    NO GROWTH 2 DAYS Performed at [facility], [address] [place], [place] [address]    Report Status PENDING  Incomplete  Culture, blood (routine x 2)     Status: None (Preliminary result)   Collection Time: 04/08/18 11:19 PM  Result Value Ref Range Status   Specimen Description   Final    BLOOD RIGHT FOREARM Performed at [facility], [address], [place], [place] [address]    Special Requests   Final    AEROBIC BOTTLE ONLY Blood Culture adequate volume Performed at [facility], [address], [place], [place] [address]    Culture   Final    NO GROWTH 2 DAYS Performed at [facility], [address] [place], [place] [address]    Report Status PENDING  Incomplete      Studies: No results found.  Scheduled Meds: . aspirin EC  81 mg Oral Daily  . atorvastatin  40 mg Oral Daily  . baclofen  10 mg Oral TID  . DULoxetine  60 mg Oral Daily  . feeding supplement (GLUCERNA SHAKE)  237 mL Oral TID BM  . gabapentin  300 mg Oral BID  . heparin  5,000 Units Subcutaneous Q8H  . insulin aspart  0-5 Units Subcutaneous QHS  . insulin aspart  0-9 Units Subcutaneous TID WC  . insulin aspart  3 Units Subcutaneous TID WC  . insulin glargine  15 Units Subcutaneous QHS  . isosorbide mononitrate  30 mg Oral Daily  . metoprolol tartrate  50 mg Oral BID  . mometasone-formoterol  2 puff Inhalation  BID  . sodium chloride flush  3 mL Intravenous Q12H  . vitamin B-12  1,000 mcg Oral Daily    Continuous Infusions: . sodium chloride 250 mL (04/10/18 1414)  . ceFEPime (MAXIPIME) IV 1 g (04/11/18 1111)  . vancomycin 1,000 mg (04/11/18 1403)     LOS: 2 days     Briant Cedar, MD [facility]   If 7PM-7AM, please contact night-coverage [email or web address] 04/11/2018, 5:38 PM

## 2018-04-12 ENCOUNTER — Encounter (HOSPITAL_COMMUNITY): Payer: Self-pay

## 2018-04-12 LAB — CBC WITH DIFFERENTIAL/PLATELET
ABS IMMATURE GRANULOCYTES: 0.06 10*3/uL (ref 0.00–0.07)
BASOS PCT: 0 %
Basophils Absolute: 0 10*3/uL (ref 0.0–0.1)
EOS ABS: 0.3 10*3/uL (ref 0.0–0.5)
Eosinophils Relative: 3 %
HEMATOCRIT: 31.4 % — AB (ref 39.0–52.0)
Hemoglobin: 9.9 g/dL — ABNORMAL LOW (ref 13.0–17.0)
Immature Granulocytes: 1 %
LYMPHS ABS: 2.3 10*3/uL (ref 0.7–4.0)
Lymphocytes Relative: 20 %
MCH: 29.6 pg (ref 26.0–34.0)
MCHC: 31.5 g/dL (ref 30.0–36.0)
MCV: 94 fL (ref 80.0–100.0)
MONOS PCT: 9 %
Monocytes Absolute: 1.1 10*3/uL — ABNORMAL HIGH (ref 0.1–1.0)
Neutro Abs: 8 10*3/uL — ABNORMAL HIGH (ref 1.7–7.7)
Neutrophils Relative %: 67 %
PLATELETS: 238 10*3/uL (ref 150–400)
RBC: 3.34 MIL/uL — ABNORMAL LOW (ref 4.22–5.81)
RDW: 13.5 % (ref 11.5–15.5)
WBC: 11.8 10*3/uL — ABNORMAL HIGH (ref 4.0–10.5)
nRBC: 0 % (ref 0.0–0.2)

## 2018-04-12 LAB — HEMOGLOBIN A1C
Hgb A1c MFr Bld: 8.8 % — ABNORMAL HIGH (ref 4.8–5.6)
Mean Plasma Glucose: 205.86 mg/dL

## 2018-04-12 LAB — BASIC METABOLIC PANEL
Anion gap: 10 (ref 5–15)
BUN: 28 mg/dL — ABNORMAL HIGH (ref 8–23)
CO2: 28 mmol/L (ref 22–32)
Calcium: 8.7 mg/dL — ABNORMAL LOW (ref 8.9–10.3)
Chloride: 100 mmol/L (ref 98–111)
Creatinine, Ser: 1.48 mg/dL — ABNORMAL HIGH (ref 0.61–1.24)
GFR calc Af Amer: 53 mL/min — ABNORMAL LOW (ref >60)
GFR, EST NON AFRICAN AMERICAN: 46 mL/min — AB (ref >60)
Glucose, Bld: 310 mg/dL — ABNORMAL HIGH (ref 70–99)
POTASSIUM: 3.7 mmol/L (ref 3.5–5.1)
Sodium: 138 mmol/L (ref 135–145)

## 2018-04-12 LAB — GLUCOSE, CAPILLARY
GLUCOSE-CAPILLARY: 282 mg/dL — AB (ref 70–99)
GLUCOSE-CAPILLARY: 232 mg/dL — AB (ref 70–99)
GLUCOSE-CAPILLARY: 175 mg/dL — AB (ref 70–99)
Glucose-Capillary: 231 mg/dL — ABNORMAL HIGH (ref 70–99)

## 2018-04-12 MED ORDER — AMLODIPINE BESYLATE 5 MG PO TABS
5.0000 mg | ORAL_TABLET | Freq: Every day | ORAL | Status: DC
  Administered 2018-04-12 – 2018-04-15 (×4): 5 mg via ORAL
  Filled 2018-04-12 (×4): qty 1

## 2018-04-12 MED ORDER — INSULIN ASPART 100 UNIT/ML ~~LOC~~ SOLN
0.0000 [IU] | Freq: Three times a day (TID) | SUBCUTANEOUS | Status: DC
  Administered 2018-04-12: 3 [IU] via SUBCUTANEOUS
  Administered 2018-04-12: 5 [IU] via SUBCUTANEOUS
  Administered 2018-04-12: 8 [IU] via SUBCUTANEOUS
  Administered 2018-04-13: 3 [IU] via SUBCUTANEOUS
  Administered 2018-04-13: 5 [IU] via SUBCUTANEOUS
  Administered 2018-04-13: 2 [IU] via SUBCUTANEOUS
  Administered 2018-04-14: 8 [IU] via SUBCUTANEOUS
  Administered 2018-04-14: 5 [IU] via SUBCUTANEOUS
  Administered 2018-04-14 – 2018-04-15 (×3): 8 [IU] via SUBCUTANEOUS

## 2018-04-12 MED ORDER — INSULIN GLARGINE 100 UNIT/ML ~~LOC~~ SOLN
20.0000 [IU] | Freq: Every day | SUBCUTANEOUS | Status: DC
  Administered 2018-04-12 – 2018-04-13 (×2): 20 [IU] via SUBCUTANEOUS
  Filled 2018-04-12 (×3): qty 0.2

## 2018-04-12 MED ORDER — INSULIN ASPART 100 UNIT/ML ~~LOC~~ SOLN
5.0000 [IU] | Freq: Three times a day (TID) | SUBCUTANEOUS | Status: DC
  Administered 2018-04-12 – 2018-04-15 (×11): 5 [IU] via SUBCUTANEOUS

## 2018-04-12 NOTE — Progress Notes (Signed)
  Speech Language Pathology Treatment: Dysphagia  Patient Details Name: Jeremy Gill MRN: [id number] DOB: March 07, 1946 Today's Date: 04/12/2018 Time: 1435-1450 SLP Time Calculation (min) (ACUTE ONLY): 15 min  Assessment / Plan / Recommendation Clinical Impression  Per RN, pt has been sleepy today and all his medications that may be sedating have been stopped.  Arrived to room and pt lethargic - closing eyes but family feeding him.   SLP orally suctioned out applesauce and water that pt held in oral cavity without swallowing. His delay in swallow continues to be significant - requiring tactile/verbal/visual cues to attempt to swallow.    Due to pt's weakness, dysphagia and delayed swallow he continues to be high aspiration risk.  Family previously had advised desire to continue to eat with mitigation strategies.  Demonstrates precautions/strategies to daughter Jeremy Berkshirerisha.  Note per CXR, lungs improving.    Will follow up with pt and family- but advise palliative referral to help establish goals given pt's weight loss due to his dysphagia, recurrent hospital admits, etc.  Thanks.    HPI HPI: 72 yo [facility] - remained lethargic yesterday and underwent brain imaging that is negative. PMH + for HTN, DM, recent pna with hospitalization and rehab stay, chronic back pain.  Pt recently was dc'd home from rehab - and functioned well until Tuesday per daughter Jeremy Gill.  Pt CT head negative.  Swallow evaluation ordered.  Pt has MBS during past hospitalization revealing moderately severe dysphagia with sensorimotor deficits with recommendation for soft/nectar diet and follow up.  Per daughter, pt was able to advance to regular/thin diet at SNF before going home and was tolerating well.  Per CT of abdomen, pt with findings concerning for aspiration or pna.        SLP Plan  Continue with current plan of care;Other (Comment)(pt would benefit from palliative referral given repeated hospital admits and  level of dysphagia/weight loss, etc)       Recommendations  Diet recommendations: Dysphagia 3 (mechanical soft);Thin liquid Liquids provided via: Cup;Straw Medication Administration: Crushed with puree(rn reports pt pocketing meds) Supervision: Full supervision/cueing for compensatory strategies;Trained caregiver to feed patient Compensations: Minimize environmental distractions;Slow rate;Small sips/bites;Other (Comment)(use puree to aid oral transit of solids, oral suction if pt not swallowing) Postural Changes and/or Swallow Maneuvers: Seated upright 90 degrees;Upright 30-60 min after meal                Oral Care Recommendations: Oral care before and after PO Follow up Recommendations: Skilled Nursing facility SLP Visit Diagnosis: Dysphagia, oropharyngeal phase (R13.12) Plan: Continue with current plan of care;Other (Comment)(pt would benefit from palliative referral given repeated hospital admits and level of dysphagia/weight loss, etc)       GO                Jeremy Gill, Jeremy Gill 04/12/2018, 3:09 PM Jeremy Burnetamara Debroah Shuttleworth, MS [facility] SLP Acute Rehab Services Pager [phone] Office [phone]

## 2018-04-12 NOTE — Progress Notes (Signed)
PROGRESS NOTE  Jeremy Gill ZHY:[id number] DOB: 10/10/1945 DOA: 04/08/2018 PCP: [place], [name], MD  HPI/Recap of past 24 hours: HPI from Dr Rema Jasmine This is a 72 year old man with medical problems including insulin-dependent diabetes (A1c of 9.1% in 12/2017), hypertension, BPH with most recently urinary retention requiring Foley catheter placement, chronic low back pain, who presents from his home with confusion and decreased responsiveness. Significant recent events include hospital stay from October 17 through March 18, 2018 where acute problems included pneumonia, NSTEMI medically managed, urinary retention requiring placement of Foley, acute on chronic anemia requiring 2 units of packed red blood cells. Transferred to a skilled nursing facility for rehab, and was discharged home about 3 days ago.  History was obtained via discussion with the patient's spouse as the patient currently is confused. Upon arrival home from his rehab facility, he was walking with a 4 wheeled walker and needing some help with his ADLs.  The spouse reports 2 months ago he was independent in his ADLs and enjoyed doing work around the house. Prior to presentation, he was found to be lethargic and could not walk.  His spouse reports he slid out of his walker onto the ground, and was minimally responsive therefore prompting EMS call. In the ED, found to have leukocytosis with white count of 17.6, creatinine of 1.5, baseline creatinine appears to be less than 1.  Urinalysis with large leukocytes.  Chest x-ray without obvious new focal infiltrate. Pt admitted for further management.   Today, pt noted to be sleepy after receiving gabapentin and flexeril. Denies any new complaints.   Assessment/Plan: Active Problems:   UTI (urinary tract infection)  Acute metabolic encephalopathy likely 2/2 sepsis from ??unstageable sacral ulcer Vs UTI Vs Polypharmacy Last temp spike was 04/09/18, currently afebrile, with resolving  leukocytosis Lactic acid WNL Procalcitonin 0.52-->0.35-->0.36 UA showed large leukocytes, >50 WBC UC with <10,000 colonies, insignificant growth BC x2 NGTD Chest x-ray shows improvement in left lower lobe pneumonia, atelectasis CT abdomen/pelvis: Bilateral kidney stones without obstruction, distended urinary bladder despite Foley catheter with bladder wall thickening.  Nonspecific pulmonary nodules, noncontrast chest CT 3 to 6 months is recommended CT head negative for any acute intracranial process MRI of the brain negative for any acute intracranial process EEG with low voltage, polymorphic delta activity that is diffusely distributed Continue empiric IV antibiotics, as significant improvement with IV Vancomycin, cefepime Monitor closely  Elevated d-dimer VQ scan very low probability for PE  Diabetes mellitus type 2 Episode of hypoglycemia on 04/09/18, now with hyperglycemia Last A1c 8.8 on 04/12/18 Continue SSI, novolog TID with meal and QHS, lantus 20U QHS Hold home metformin  AKI Ongoing Baseline creatinine normal, on admission 1.5, likely prerenal Vs NSAID/medication induced Daily BMP  Multiple ulcers including unstageable sacral, stage I on left heel, stage II on right heel WOC nurse on board, appreciate recommendations MRI sacrum ruled out osteomyelitis Continue empiric IV antibiotics  Hypertension Uncontrolled Continue home lopressor, imdur, hydralazine prn, started amlodipine  Hyperlipidemia Continue statins  Normocytic anemia Hemoglobin at baseline, unclear etiology Daily CBC  BPH, with recent urinary retention Continue Foley, plan to wean off once AKI improves significantly  Chronic back pain Held home flexeril, gabapentin again     Code Status: Full  Family Communication: Spoke to wife and mother in law  Disposition Plan: SNF once work-up is complete and significantly  improved   Consultants:  None  Procedures:  None  Antimicrobials:  IV vancomycin  IV cefepime  DVT prophylaxis:  Heparin   Objective: Vitals:   04/12/18 0416 04/12/18 0825 04/12/18 0827 04/12/18 1310  BP: (!) 158/90 (!) 173/91 (!) 173/91 (!) 148/91  Pulse: 84 86 86 79  Resp: 18     Temp: 98.6 F (37 C)  97.9 F (36.6 C) 97.7 F (36.5 C)  TempSrc: Oral  Oral Oral  SpO2: 98%  100% 99%  Weight: 70 kg     Height:        Intake/Output Summary (Last 24 hours) at 04/12/2018 1614 Last data filed at 04/12/2018 1530 Gross per 24 hour  Intake 560 ml  Output 3700 ml  Net -3140 ml   Filed Weights   04/10/18 0500 04/11/18 0442 04/12/18 0416  Weight: 70 kg 69.9 kg 70 kg    Exam: General: NAD, sleepy but easily arousable  Cardiovascular: S1, S2 present Respiratory: CTAB Abdomen: Soft, nontender, nondistended, bowel sounds present Musculoskeletal: No bilateral pedal edema noted Skin:  Unstageable sacral ulcer, stage I left heel, stage II right heel Psychiatry: Normal mood   Data Reviewed: CBC: Recent Labs  Lab 04/08/18 1604 04/09/18 0343 04/10/18 0526 04/11/18 0537 04/12/18 0512  WBC 17.6* 17.7* 16.9* 12.2* 11.8*  NEUTROABS 14.7*  --  13.8* 8.8* 8.0*  HGB 9.6* 10.0* 11.8* 10.0* 9.9*  HCT 30.4* 32.3* 37.5* 31.2* 31.4*  MCV 94.7 94.7 91.9 90.7 94.0  PLT 202 207 266 249 238   Basic Metabolic Panel: Recent Labs  Lab 04/08/18 1604 04/09/18 0343 04/10/18 0526 04/11/18 0537 04/12/18 0512  NA 139 141 141 138 138  K 4.1 4.1 3.8 3.9 3.7  CL 107 105 102 102 100  CO2 24 25 25 26 28   GLUCOSE 223* 85 198* 294* 310*  BUN 32* 32* 25* 30* 28*  CREATININE 1.50* 1.45* 1.53* 1.48* 1.48*  CALCIUM 7.7* 8.5* 8.7* 8.4* 8.7*   GFR: Estimated Creatinine Clearance: 44.7 mL/min (A) (by C-G formula based on SCr of 1.48 mg/dL (H)). Liver Function Tests: No results for input(s): AST, ALT, ALKPHOS, BILITOT, PROT, ALBUMIN in the last 168 hours. No results for input(s):  LIPASE, AMYLASE in the last 168 hours. Recent Labs  Lab 04/08/18 1604  AMMONIA 15   Coagulation Profile: No results for input(s): INR, PROTIME in the last 168 hours. Cardiac Enzymes: Recent Labs  Lab 04/08/18 1604  CKTOTAL 48*   BNP (last 3 results) No results for input(s): PROBNP in the last 8760 hours. HbA1C: Recent Labs    04/12/18 0512  HGBA1C 8.8*   CBG: Recent Labs  Lab 04/11/18 1127 04/11/18 1704 04/12/18 0735 04/12/18 1041 04/12/18 1145  GLUCAP 257* 374* 282* 232* 231*   Lipid Profile: No results for input(s): CHOL, HDL, LDLCALC, TRIG, CHOLHDL, LDLDIRECT in the last 72 hours. Thyroid Function Tests: No results for input(s): TSH, T4TOTAL, FREET4, T3FREE, THYROIDAB in the last 72 hours. Anemia Panel: No results for input(s): VITAMINB12, FOLATE, FERRITIN, TIBC, IRON, RETICCTPCT in the last 72 hours. Urine analysis:    Component Value Date/Time   COLORURINE YELLOW 04/08/2018 1802   APPEARANCEUR CLOUDY (A) 04/08/2018 1802   LABSPEC 1.010 04/08/2018 1802   PHURINE 6.0 04/08/2018 1802   GLUCOSEU 50 (A) 04/08/2018 1802   GLUCOSEU NEGATIVE 09/30/2008 0851   HGBUR LARGE (A) 04/08/2018 1802   HGBUR trace-lysed 03/29/2010 0940   BILIRUBINUR NEGATIVE 04/08/2018 1802   BILIRUBINUR neg 01/13/2014 1141   KETONESUR NEGATIVE 04/08/2018 1802   PROTEINUR 100 (A) 04/08/2018 1802   UROBILINOGEN 1.0 01/13/2014 1141   UROBILINOGEN 0.2 03/29/2010 0940  NITRITE NEGATIVE 04/08/2018 1802   LEUKOCYTESUR LARGE (A) 04/08/2018 1802   Sepsis Labs: @LABRCNTIP (procalcitonin:4,lacticidven:4)  ) Recent Results (from the past 240 hour(s))  Urine culture     Status: Abnormal   Collection Time: 04/08/18  6:02 PM  Result Value Ref Range Status   Specimen Description   Final    URINE, CATHETERIZED Performed at [facility], [address], [place], [place] [address]    Special Requests   Final    NONE Performed at [facility], [address], [place], [place] [address]    Culture (A)  Final    <10,000 COLONIES/mL INSIGNIFICANT GROWTH Performed at [facility], [address], [place], [place] [address]    Report Status 04/10/2018 FINAL  Final  Culture, blood (routine x 2)     Status: None (Preliminary result)   Collection Time: 04/08/18 11:19 PM  Result Value Ref Range Status   Specimen Description   Final    BLOOD RIGHT HAND Performed at [facility], [address] [place], [place] [address]    Special Requests   Final    BOTTLES DRAWN AEROBIC AND ANAEROBIC Blood Culture adequate volume Performed at [facility], [address], [place], [place] [address]    Culture   Final    NO GROWTH 3 DAYS Performed at [facility], [address] [place], [place] [address]    Report Status PENDING  Incomplete  Culture, blood (routine x 2)     Status: None (Preliminary result)   Collection Time: 04/08/18 11:19 PM  Result Value Ref Range Status   Specimen Description   Final    BLOOD RIGHT FOREARM Performed at [facility], [address] [place], [place] [address]    Special Requests   Final    AEROBIC BOTTLE ONLY Blood Culture adequate volume Performed at [facility], [address], [place], [place] [address]    Culture   Final    NO GROWTH 3 DAYS Performed at [facility], [address] [place], [place] [address]    Report Status PENDING  Incomplete      Studies: No results found.  Scheduled Meds: . amLODipine  5 mg Oral Daily  . aspirin EC  81 mg Oral Daily  . atorvastatin  40 mg Oral Daily  . DULoxetine  60 mg Oral Daily  . feeding supplement (GLUCERNA SHAKE)  237 mL Oral TID BM  . heparin  5,000 Units Subcutaneous Q8H  . insulin aspart  0-15 Units Subcutaneous TID WC  . insulin aspart  0-5 Units Subcutaneous QHS  . insulin aspart  5 Units Subcutaneous TID WC  . insulin glargine  20 Units Subcutaneous QHS  . isosorbide  mononitrate  30 mg Oral Daily  . metoprolol tartrate  50 mg Oral BID  . mometasone-formoterol  2 puff Inhalation BID  . sodium chloride flush  3 mL Intravenous Q12H  . vitamin B-12  1,000 mcg Oral Daily    Continuous Infusions: . sodium chloride 250 mL (04/12/18 1034)  . ceFEPime (MAXIPIME) IV 1 g (04/12/18 1036)  . vancomycin 1,000 mg (04/12/18 1452)     LOS: 3 days     Briant Cedar, MD [facility]   If 7PM-7AM, please contact night-coverage [email or web address] 04/12/2018, 4:14 PM

## 2018-04-12 NOTE — Progress Notes (Addendum)
CSW spoke with patients spouse, Elease Hashimotoatricia, regarding disposition plans. Patient was recently at [facility] for [facility] days- spouse does not think patient has any SNF days left with insurance benefits and was planning on [facility]. Spouse states she would like for him to try going home first at this discharge before considering another SNF stay. CSW will notify RN CM.   Stacy GardnerErin Alfhild Partch, LCSW Clinical Social Worker  [facility]  [phone]

## 2018-04-13 LAB — CBC WITH DIFFERENTIAL/PLATELET
ABS IMMATURE GRANULOCYTES: 0.09 10*3/uL — AB (ref 0.00–0.07)
BASOS PCT: 0 %
Basophils Absolute: 0 10*3/uL (ref 0.0–0.1)
Eosinophils Absolute: 0.4 10*3/uL (ref 0.0–0.5)
Eosinophils Relative: 3 %
HCT: 31.7 % — ABNORMAL LOW (ref 39.0–52.0)
Hemoglobin: 9.9 g/dL — ABNORMAL LOW (ref 13.0–17.0)
Immature Granulocytes: 1 %
Lymphocytes Relative: 19 %
Lymphs Abs: 2.6 10*3/uL (ref 0.7–4.0)
MCH: 28.9 pg (ref 26.0–34.0)
MCHC: 31.2 g/dL (ref 30.0–36.0)
MCV: 92.7 fL (ref 80.0–100.0)
Monocytes Absolute: 1 10*3/uL (ref 0.1–1.0)
Monocytes Relative: 8 %
Neutro Abs: 9.6 10*3/uL — ABNORMAL HIGH (ref 1.7–7.7)
Neutrophils Relative %: 69 %
PLATELETS: 280 10*3/uL (ref 150–400)
RBC: 3.42 MIL/uL — AB (ref 4.22–5.81)
RDW: 13.5 % (ref 11.5–15.5)
WBC: 13.7 10*3/uL — AB (ref 4.0–10.5)
nRBC: 0 % (ref 0.0–0.2)

## 2018-04-13 LAB — BASIC METABOLIC PANEL
Anion gap: 11 (ref 5–15)
BUN: 29 mg/dL — ABNORMAL HIGH (ref 8–23)
CALCIUM: 8.8 mg/dL — AB (ref 8.9–10.3)
CO2: 28 mmol/L (ref 22–32)
CREATININE: 1.33 mg/dL — AB (ref 0.61–1.24)
Chloride: 102 mmol/L (ref 98–111)
GFR calc Af Amer: >60 mL/min (ref >60)
GFR, EST NON AFRICAN AMERICAN: 52 mL/min — AB (ref >60)
Glucose, Bld: 174 mg/dL — ABNORMAL HIGH (ref 70–99)
Potassium: 3.2 mmol/L — ABNORMAL LOW (ref 3.5–5.1)
SODIUM: 141 mmol/L (ref 135–145)

## 2018-04-13 LAB — GLUCOSE, CAPILLARY
GLUCOSE-CAPILLARY: 166 mg/dL — AB (ref 70–99)
Glucose-Capillary: 141 mg/dL — ABNORMAL HIGH (ref 70–99)
Glucose-Capillary: 215 mg/dL — ABNORMAL HIGH (ref 70–99)
Glucose-Capillary: 283 mg/dL — ABNORMAL HIGH (ref 70–99)

## 2018-04-13 MED ORDER — TAMSULOSIN HCL 0.4 MG PO CAPS
0.4000 mg | ORAL_CAPSULE | Freq: Every day | ORAL | Status: DC
  Administered 2018-04-13 – 2018-04-15 (×3): 0.4 mg via ORAL
  Filled 2018-04-13 (×3): qty 1

## 2018-04-13 MED ORDER — TAMSULOSIN HCL 0.4 MG PO CAPS: 0.4000 mg | ORAL_CAPSULE | Freq: Every day | ORAL | Status: DC

## 2018-04-13 MED ORDER — TRAMADOL HCL 50 MG PO TABS
25.0000 mg | ORAL_TABLET | Freq: Three times a day (TID) | ORAL | Status: DC | PRN
  Administered 2018-04-13 – 2018-04-14 (×2): 25 mg via ORAL
  Filled 2018-04-13 (×2): qty 1

## 2018-04-13 MED ORDER — POTASSIUM CHLORIDE CRYS ER 20 MEQ PO TBCR
40.0000 meq | EXTENDED_RELEASE_TABLET | Freq: Once | ORAL | Status: AC
  Administered 2018-04-13: 40 meq via ORAL
  Filled 2018-04-13: qty 2

## 2018-04-13 NOTE — Progress Notes (Signed)
PROGRESS NOTE  Jeremy SalinesJames E Gill ZOX:[id number] DOB: [birth year] DOA: 04/08/2018 PCP: [place], [name], MD  HPI/Recap of past 24 hours: HPI from Dr Rema JasmineKuhlman This is a 72 year old man with medical problems including insulin-dependent diabetes (A1c of 9.1% in 12/2017), hypertension, BPH with most recently urinary retention requiring Foley catheter placement, chronic low back pain, who presents from his home with confusion and decreased responsiveness. Significant recent events include hospital stay from October 17 through March 18, 2018 where acute problems included pneumonia, NSTEMI medically managed, urinary retention requiring placement of Foley, acute on chronic anemia requiring 2 units of packed red blood cells. Transferred to a skilled nursing facility for rehab, and was discharged home about 3 days ago.  History was obtained via discussion with the patient's spouse as the patient currently is confused. Upon arrival home from his rehab facility, he was walking with a 4 wheeled walker and needing some help with his ADLs.  The spouse reports 2 months ago he was independent in his ADLs and enjoyed doing work around the house. Prior to presentation, he was found to be lethargic and could not walk.  His spouse reports he slid out of his walker onto the ground, and was minimally responsive therefore prompting EMS call. In the ED, found to have leukocytosis with white count of 17.6, creatinine of 1.5, baseline creatinine appears to be less than 1.  Urinalysis with large leukocytes.  Chest x-ray without obvious new focal infiltrate. Pt admitted for further management.   Today, pt more awake after gabapentin, flexeril was held. Still c/o chronic back pain  Assessment/Plan: Active Problems:   UTI (urinary tract infection)  Acute metabolic encephalopathy likely 2/2 sepsis from ??unstageable sacral ulcer Vs UTI Vs Polypharmacy (on multiple sedating meds with poor renal function) Last temp spike was 04/09/18,  currently afebrile, with resolving leukocytosis Lactic acid WNL Procalcitonin 0.52-->0.35-->0.36 UA showed large leukocytes, >50 WBC UC with <10,000 colonies, insignificant growth BC x2 NGTD Chest x-ray shows improvement in left lower lobe pneumonia, atelectasis CT abdomen/pelvis: Bilateral kidney stones without obstruction, distended urinary bladder despite Foley catheter with bladder wall thickening.  Nonspecific pulmonary nodules, noncontrast chest CT 3 to 6 months is recommended CT head negative for any acute intracranial process MRI of the brain negative for any acute intracranial process EEG with low voltage, polymorphic delta activity that is diffusely distributed D/C IV Vancomycin, will continue cefepime for a total of 7 days of antibiotics Monitor closely  Elevated d-dimer VQ scan very low probability for PE  Diabetes mellitus type 2 Episode of hypoglycemia on 04/09/18, now with hyperglycemia Last A1c 8.8 on 04/12/18 Continue SSI, novolog TID with meal and QHS, lantus 20U QHS Hold home metformin  AKI Slowly improving Baseline creatinine normal, on admission 1.5, likely prerenal Vs NSAID/medication induced Daily BMP  Hypokalemia Replace prn  Multiple ulcers including unstageable sacral, stage I on left heel, stage II on right heel WOC nurse on board, appreciate recommendations MRI sacrum ruled out osteomyelitis Continue IV antibiotics  Hypertension Uncontrolled Continue home lopressor, imdur, hydralazine prn, started amlodipine  Hyperlipidemia Continue statins  Normocytic anemia Hemoglobin at baseline, unclear etiology Daily CBC  BPH, with recent urinary retention Start voiding trial, prn in and out if pt fails Start flomax  Chronic back pain Restart home tramadol at a reduced dose Held home flexeril, gabapentin again     Code Status: Full  Family Communication: Spoke to wife and mother in law  Disposition Plan: SNF once work-up is complete and  significantly improved   Consultants:  None  Procedures:  None  Antimicrobials:  IV cefepime  DVT prophylaxis: Heparin   Objective: Vitals:   04/12/18 2035 04/13/18 0435 04/13/18 0804 04/13/18 1356  BP:  (!) 164/88  (!) 161/87  Pulse:  85  87  Resp:  20  18  Temp:  99.1 F (37.3 C)  98.3 F (36.8 C)  TempSrc:  Oral  Oral  SpO2: 99% 99% 95% 100%  Weight:      Height:        Intake/Output Summary (Last 24 hours) at 04/13/2018 1440 Last data filed at 04/13/2018 1036 Gross per 24 hour  Intake 608.73 ml  Output 4150 ml  Net -3541.27 ml   Filed Weights   04/10/18 0500 04/11/18 0442 04/12/18 0416  Weight: 70 kg 69.9 kg 70 kg    Exam:  General: NAD, awake, alert  Cardiovascular: S1, S2 present  Respiratory: CTAB  Abdomen: Soft, nontender, nondistended, bowel sounds present  Musculoskeletal: No bilateral pedal edema noted  Skin: Normal  Psychiatry: Normal mood   Data Reviewed: CBC: Recent Labs  Lab 04/08/18 1604 04/09/18 0343 04/10/18 0526 04/11/18 0537 04/12/18 0512 04/13/18 0518  WBC 17.6* 17.7* 16.9* 12.2* 11.8* 13.7*  NEUTROABS 14.7*  --  13.8* 8.8* 8.0* 9.6*  HGB 9.6* 10.0* 11.8* 10.0* 9.9* 9.9*  HCT 30.4* 32.3* 37.5* 31.2* 31.4* 31.7*  MCV 94.7 94.7 91.9 90.7 94.0 92.7  PLT 202 207 266 249 238 280   Basic Metabolic Panel: Recent Labs  Lab 04/09/18 0343 04/10/18 0526 04/11/18 0537 04/12/18 0512 04/13/18 0518  NA 141 141 138 138 141  K 4.1 3.8 3.9 3.7 3.2*  CL 105 102 102 100 102  CO2 25 25 26 28 28   GLUCOSE 85 198* 294* 310* 174*  BUN 32* 25* 30* 28* 29*  CREATININE 1.45* 1.53* 1.48* 1.48* 1.33*  CALCIUM 8.5* 8.7* 8.4* 8.7* 8.8*   GFR: Estimated Creatinine Clearance: 49.7 mL/min (A) (by C-G formula based on SCr of 1.33 mg/dL (H)). Liver Function Tests: No results for input(s): AST, ALT, ALKPHOS, BILITOT, PROT, ALBUMIN in the last 168 hours. No results for input(s): LIPASE, AMYLASE in the last 168 hours. Recent Labs    Lab 04/08/18 1604  AMMONIA 15   Coagulation Profile: No results for input(s): INR, PROTIME in the last 168 hours. Cardiac Enzymes: Recent Labs  Lab 04/08/18 1604  CKTOTAL 48*   BNP (last 3 results) No results for input(s): PROBNP in the last 8760 hours. HbA1C: Recent Labs    04/12/18 0512  HGBA1C 8.8*   CBG: Recent Labs  Lab 04/12/18 1041 04/12/18 1145 04/12/18 2200 04/13/18 0718 04/13/18 1134  GLUCAP 232* 231* 175* 141* 166*   Lipid Profile: No results for input(s): CHOL, HDL, LDLCALC, TRIG, CHOLHDL, LDLDIRECT in the last 72 hours. Thyroid Function Tests: No results for input(s): TSH, T4TOTAL, FREET4, T3FREE, THYROIDAB in the last 72 hours. Anemia Panel: No results for input(s): VITAMINB12, FOLATE, FERRITIN, TIBC, IRON, RETICCTPCT in the last 72 hours. Urine analysis:    Component Value Date/Time   COLORURINE YELLOW 04/08/2018 1802   APPEARANCEUR CLOUDY (A) 04/08/2018 1802   LABSPEC 1.010 04/08/2018 1802   PHURINE 6.0 04/08/2018 1802   GLUCOSEU 50 (A) 04/08/2018 1802   GLUCOSEU NEGATIVE 09/30/2008 0851   HGBUR LARGE (A) 04/08/2018 1802   HGBUR trace-lysed 03/29/2010 0940   BILIRUBINUR NEGATIVE 04/08/2018 1802   BILIRUBINUR neg 01/13/2014 1141   KETONESUR NEGATIVE 04/08/2018 1802   PROTEINUR 100 (A) 04/08/2018  1802   UROBILINOGEN 1.0 01/13/2014 1141   UROBILINOGEN 0.2 03/29/2010 0940   NITRITE NEGATIVE 04/08/2018 1802   LEUKOCYTESUR LARGE (A) 04/08/2018 1802   Sepsis Labs: @LABRCNTIP (procalcitonin:4,lacticidven:4)  ) Recent Results (from the past 240 hour(s))  Urine culture     Status: Abnormal   Collection Time: 04/08/18  6:02 PM  Result Value Ref Range Status   Specimen Description   Final    URINE, CATHETERIZED Performed at [facility], [address] [place], [place] [address]    Special Requests   Final    NONE Performed at [facility], [address] [place], [place] [address]    Culture (A)   Final    <10,000 COLONIES/mL INSIGNIFICANT GROWTH Performed at [facility], [address], [place], [place] [address]    Report Status 04/10/2018 FINAL  Final  Culture, blood (routine x 2)     Status: None (Preliminary result)   Collection Time: 04/08/18 11:19 PM  Result Value Ref Range Status   Specimen Description   Final    BLOOD RIGHT HAND Performed at [facility], [address] [place], [place] [address]    Special Requests   Final    BOTTLES DRAWN AEROBIC AND ANAEROBIC Blood Culture adequate volume Performed at [facility], [address], [place], [place] [address]    Culture   Final    NO GROWTH 4 DAYS Performed at [facility], [address], [place], [place] [address]    Report Status PENDING  Incomplete  Culture, blood (routine x 2)     Status: None (Preliminary result)   Collection Time: 04/08/18 11:19 PM  Result Value Ref Range Status   Specimen Description   Final    BLOOD RIGHT FOREARM Performed at [facility], [address], [place], [place] [address]    Special Requests   Final    AEROBIC BOTTLE ONLY Blood Culture adequate volume Performed at [facility], [address], [place], [place] [address]    Culture   Final    NO GROWTH 4 DAYS Performed at [facility], [address] [place], [place] [address]    Report Status PENDING  Incomplete      Studies: No results found.  Scheduled Meds: . amLODipine  5 mg Oral Daily  . aspirin EC  81 mg Oral Daily  . atorvastatin  40 mg Oral Daily  . DULoxetine  60 mg Oral Daily  . feeding supplement (GLUCERNA SHAKE)  237 mL Oral TID BM  . heparin  5,000 Units Subcutaneous Q8H  . insulin aspart  0-15 Units Subcutaneous TID WC  . insulin aspart  0-5 Units Subcutaneous QHS  . insulin aspart  5 Units Subcutaneous TID WC  . insulin glargine  20 Units Subcutaneous QHS  . isosorbide mononitrate  30 mg Oral Daily  . metoprolol tartrate  50  mg Oral BID  . mometasone-formoterol  2 puff Inhalation BID  . sodium chloride flush  3 mL Intravenous Q12H  . tamsulosin  0.4 mg Oral QPC breakfast  . vitamin B-12  1,000 mcg Oral Daily    Continuous Infusions: . sodium chloride 10 mL/hr at 04/12/18 2200  . ceFEPime (MAXIPIME) IV 1 g (04/13/18 1031)     LOS: 4 days     Briant Cedar, MD [facility]   If 7PM-7AM, please contact night-coverage [email or web address] 04/13/2018, 2:40 PM

## 2018-04-13 NOTE — Progress Notes (Signed)
Pharmacy Antibiotic Note  Jeremy Gill is a 72 y.o. male with hx urinary retention requiring foley cath and sacral/right heel ulcers presented to the on 04/08/2018 with AMS. Broad abx with vancomycin and cefepime were started on admission for suspected sepsis.  - 11/14 sacrum MRI: no sacral or coccygeal Osteomyelitis - 11/14 abd CT: concerning for PNA or aspiration; pulmonary nodule   Today, 04/13/2018: - day #5 abx - afeb, wbc 13.7 - scr labile, 1.33 (crcl~49) - blood cultures have been negative thus far   Plan: - continue vancomycin 1000 mg IV q24h - continuecefepime dose to 1gm IV q12h - Please indicate plan/LOT for abx.  If to continue with vancomycin, pharmacy will plan on checking level in the next 24-48 hrs - f/u renal function and cultures  _________________________________  Height: 6' (182.9 cm) Weight: 154 lb 5.2 oz (70 kg) IBW/kg (Calculated) : 77.6  Temp (24hrs), Avg:98.3 F (36.8 C), Min:97.7 F (36.5 C), Max:99.1 F (37.3 C)  Recent Labs  Lab 04/09/18 0343 04/09/18 1548 04/09/18 2056 04/10/18 0526 04/11/18 0537 04/12/18 0512 04/13/18 0518  WBC 17.7*  --   --  16.9* 12.2* 11.8* 13.7*  CREATININE 1.45*  --   --  1.53* 1.48* 1.48* 1.33*  LATICACIDVEN  --  0.7 0.7  --   --   --   --     Estimated Creatinine Clearance: 49.7 mL/min (A) (by C-G formula based on SCr of 1.33 mg/dL (H)).    No Known Allergies  Antimicrobials this admission: 11/12 CTX x 1 11/13 vancomycin >>  11/13 cefepime >>      Microbiology results: 11/12 BCx x2: 11/12 UCx:  <10k colonies, insignificant growth FINAL  Thank you for allowing pharmacy to be a part of this patient's care.  Lucia Gaskinsham, Joniqua Sidle P 04/13/2018 8:48 AM

## 2018-04-13 NOTE — Progress Notes (Signed)
Dressing change done to sacrum. Pt tolerated with no problems.

## 2018-04-13 NOTE — Progress Notes (Signed)
Foley cath discontinued per order.pt tolerated with no problems. Derinda SisVera Cord Wilczynski,RN.

## 2018-04-14 ENCOUNTER — Encounter (HOSPITAL_COMMUNITY): Payer: Self-pay

## 2018-04-14 LAB — BASIC METABOLIC PANEL
ANION GAP: 11 (ref 5–15)
BUN: 33 mg/dL — ABNORMAL HIGH (ref 8–23)
CALCIUM: 8.6 mg/dL — AB (ref 8.9–10.3)
CHLORIDE: 99 mmol/L (ref 98–111)
CO2: 28 mmol/L (ref 22–32)
CREATININE: 1.3 mg/dL — AB (ref 0.61–1.24)
GFR calc non Af Amer: 53 mL/min — ABNORMAL LOW (ref >60)
Glucose, Bld: 285 mg/dL — ABNORMAL HIGH (ref 70–99)
Potassium: 3.7 mmol/L (ref 3.5–5.1)
Sodium: 138 mmol/L (ref 135–145)

## 2018-04-14 LAB — CBC WITH DIFFERENTIAL/PLATELET
ABS IMMATURE GRANULOCYTES: 0.15 10*3/uL — AB (ref 0.00–0.07)
BASOS ABS: 0.1 10*3/uL (ref 0.0–0.1)
Basophils Relative: 0 %
Eosinophils Absolute: 0.5 10*3/uL (ref 0.0–0.5)
Eosinophils Relative: 4 %
HEMATOCRIT: 29.6 % — AB (ref 39.0–52.0)
HEMOGLOBIN: 9.3 g/dL — AB (ref 13.0–17.0)
IMMATURE GRANULOCYTES: 1 %
LYMPHS ABS: 3 10*3/uL (ref 0.7–4.0)
LYMPHS PCT: 22 %
MCH: 28.9 pg (ref 26.0–34.0)
MCHC: 31.4 g/dL (ref 30.0–36.0)
MCV: 91.9 fL (ref 80.0–100.0)
Monocytes Absolute: 1.1 10*3/uL — ABNORMAL HIGH (ref 0.1–1.0)
Monocytes Relative: 8 %
NEUTROS ABS: 8.9 10*3/uL — AB (ref 1.7–7.7)
NEUTROS PCT: 65 %
NRBC: 0 % (ref 0.0–0.2)
Platelets: 273 10*3/uL (ref 150–400)
RBC: 3.22 MIL/uL — ABNORMAL LOW (ref 4.22–5.81)
RDW: 13.5 % (ref 11.5–15.5)
WBC: 13.7 10*3/uL — AB (ref 4.0–10.5)

## 2018-04-14 LAB — GLUCOSE, CAPILLARY
GLUCOSE-CAPILLARY: 184 mg/dL — AB (ref 70–99)
GLUCOSE-CAPILLARY: 217 mg/dL — AB (ref 70–99)
Glucose-Capillary: 280 mg/dL — ABNORMAL HIGH (ref 70–99)
Glucose-Capillary: 270 mg/dL — ABNORMAL HIGH (ref 70–99)

## 2018-04-14 LAB — CULTURE, BLOOD (ROUTINE X 2)
Culture: NO GROWTH
Culture: NO GROWTH
Special Requests: ADEQUATE
Special Requests: ADEQUATE

## 2018-04-14 MED ORDER — INSULIN GLARGINE 100 UNIT/ML ~~LOC~~ SOLN
25.0000 [IU] | Freq: Every day | SUBCUTANEOUS | Status: DC
  Administered 2018-04-14: 25 [IU] via SUBCUTANEOUS
  Filled 2018-04-14 (×2): qty 0.25

## 2018-04-14 NOTE — Progress Notes (Signed)
Clinical Social Worker following patient for support and discharge needs. CSW contacted Lanora ManisElizabeth at [employer] to start authorization with insurance. Lanora Manislizabeth stated that due to the high risk of the patient his insurance claims were flagged and and his insurance authorization was sent to the medical director of the company to be reviewed. Lanora Manislizabeth stated she will reach out to CSW to let her know when medical director has made a decision.   Marrianne MoodAshley Mame Twombly, MSW,  Theresia MajorsLCSWA [phone]

## 2018-04-14 NOTE — Progress Notes (Signed)
PROGRESS NOTE  Jeremy Gill ZOX:[id number] DOB: 20-Jul-1945 DOA: 04/08/2018 PCP: [place], [name], MD  HPI/Recap of past 24 hours: HPI from Dr Rema Jasmine This is a 72 year old man with medical problems including insulin-dependent diabetes (A1c of 9.1% in 12/2017), hypertension, BPH with most recently urinary retention requiring Foley catheter placement, chronic low back pain, who presents from his home with confusion and decreased responsiveness. Significant recent events include hospital stay from October 17 through March 18, 2018 where acute problems included pneumonia, NSTEMI medically managed, urinary retention requiring placement of Foley, acute on chronic anemia requiring 2 units of packed red blood cells. Transferred to a skilled nursing facility for rehab, and was discharged home about 3 days ago.  History was obtained via discussion with the patient's spouse as the patient currently is confused. Upon arrival home from his rehab facility, he was walking with a 4 wheeled walker and needing some help with his ADLs.  The spouse reports 2 months ago he was independent in his ADLs and enjoyed doing work around the house. Prior to presentation, he was found to be lethargic and could not walk.  His spouse reports he slid out of his walker onto the ground, and was minimally responsive therefore prompting EMS call. In the ED, found to have leukocytosis with white count of 17.6, creatinine of 1.5, baseline creatinine appears to be less than 1.  Urinalysis with large leukocytes.  Chest x-ray without obvious new focal infiltrate. Pt admitted for further management.   Today, pt more awake, seems much oriented. Participating in conversations. No new complaints  Assessment/Plan: Active Problems:   UTI (urinary tract infection)  Acute metabolic encephalopathy likely 2/2 sepsis from ??unstageable sacral ulcer Vs UTI Vs Polypharmacy (on multiple sedating meds with poor renal function) Last temp spike was  04/09/18, currently afebrile, with fluctuating leukocytosis Lactic acid WNL Procalcitonin 0.52-->0.35-->0.36 UA showed large leukocytes, >50 WBC UC with <10,000 colonies, insignificant growth BC x2 NGTD Chest x-ray shows improvement in left lower lobe pneumonia, atelectasis CT abdomen/pelvis: Bilateral kidney stones without obstruction, distended urinary bladder despite Foley catheter with bladder wall thickening.  Nonspecific pulmonary nodules, noncontrast chest CT 3 to 6 months is recommended CT head negative for any acute intracranial process MRI of the brain negative for any acute intracranial process EEG with low voltage, polymorphic delta activity that is diffusely distributed D/C IV Vancomycin, will continue cefepime for a total of 7 days of antibiotics Monitor closely  Elevated d-dimer VQ scan very low probability for PE  Diabetes mellitus type 2 Episode of hypoglycemia on 04/09/18, now with hyperglycemia Last A1c 8.8 on 04/12/18 Continue SSI, novolog TID with meal and QHS, increase lantus 25U QHS Hold home metformin  AKI Slowly improving Baseline creatinine normal, on admission 1.5, likely prerenal Vs NSAID/medication induced Daily BMP  Hypokalemia Replace prn  Multiple ulcers including unstageable sacral, stage I on left heel, stage II on right heel WOC nurse on board, appreciate recommendations MRI sacrum ruled out osteomyelitis Continue IV antibiotics  Hypertension Uncontrolled Continue home lopressor, imdur, hydralazine prn, started amlodipine  Hyperlipidemia Continue statins  Normocytic anemia Hemoglobin at baseline, unclear etiology Daily CBC  BPH, with recent urinary retention Successful voiding trial, removed foley on 04/13/18 Continue flomax  Chronic back pain Restart home tramadol at a reduced dose Held home flexeril, gabapentin again     Code Status: Full  Family Communication: Spoke to wife and mother in law  Disposition Plan: SNF Vs  home depending on insurance authorization. SW on  board   Consultants:  None  Procedures:  None  Antimicrobials:  IV cefepime  DVT prophylaxis: Heparin   Objective: Vitals:   04/14/18 0453 04/14/18 0801 04/14/18 0823 04/14/18 1446  BP: (!) 147/79  (!) 162/85 110/62  Pulse: 81  86 84  Resp: 18   12  Temp: 97.9 F (36.6 C)   98.6 F (37 C)  TempSrc: Oral   Oral  SpO2: 98% 98%  97%  Weight:      Height:        Intake/Output Summary (Last 24 hours) at 04/14/2018 1710 Last data filed at 04/14/2018 1500 Gross per 24 hour  Intake 1046.35 ml  Output 1125 ml  Net -78.65 ml   Filed Weights   04/10/18 0500 04/11/18 0442 04/12/18 0416  Weight: 70 kg 69.9 kg 70 kg    Exam:  General: NAD   Cardiovascular: S1, S2 present  Respiratory: CTAB  Abdomen: Soft, nontender, nondistended, bowel sounds present  Musculoskeletal: No bilateral pedal edema noted  Skin: Normal  Psychiatry: Normal mood   Data Reviewed: CBC: Recent Labs  Lab 04/10/18 0526 04/11/18 0537 04/12/18 0512 04/13/18 0518 04/14/18 0531  WBC 16.9* 12.2* 11.8* 13.7* 13.7*  NEUTROABS 13.8* 8.8* 8.0* 9.6* 8.9*  HGB 11.8* 10.0* 9.9* 9.9* 9.3*  HCT 37.5* 31.2* 31.4* 31.7* 29.6*  MCV 91.9 90.7 94.0 92.7 91.9  PLT 266 249 238 280 273   Basic Metabolic Panel: Recent Labs  Lab 04/10/18 0526 04/11/18 0537 04/12/18 0512 04/13/18 0518 04/14/18 0531  NA 141 138 138 141 138  K 3.8 3.9 3.7 3.2* 3.7  CL 102 102 100 102 99  CO2 25 26 28 28 28   GLUCOSE 198* 294* 310* 174* 285*  BUN 25* 30* 28* 29* 33*  CREATININE 1.53* 1.48* 1.48* 1.33* 1.30*  CALCIUM 8.7* 8.4* 8.7* 8.8* 8.6*   GFR: Estimated Creatinine Clearance: 50.9 mL/min (A) (by C-G formula based on SCr of 1.3 mg/dL (H)). Liver Function Tests: No results for input(s): AST, ALT, ALKPHOS, BILITOT, PROT, ALBUMIN in the last 168 hours. No results for input(s): LIPASE, AMYLASE in the last 168 hours. Recent Labs  Lab 04/08/18 1604  AMMONIA  15   Coagulation Profile: No results for input(s): INR, PROTIME in the last 168 hours. Cardiac Enzymes: Recent Labs  Lab 04/08/18 1604  CKTOTAL 48*   BNP (last 3 results) No results for input(s): PROBNP in the last 8760 hours. HbA1C: Recent Labs    04/12/18 0512  HGBA1C 8.8*   CBG: Recent Labs  Lab 04/13/18 1616 04/13/18 2135 04/14/18 0733 04/14/18 1232 04/14/18 1617  GLUCAP 215* 283* 280* 184* 217*   Lipid Profile: No results for input(s): CHOL, HDL, LDLCALC, TRIG, CHOLHDL, LDLDIRECT in the last 72 hours. Thyroid Function Tests: No results for input(s): TSH, T4TOTAL, FREET4, T3FREE, THYROIDAB in the last 72 hours. Anemia Panel: No results for input(s): VITAMINB12, FOLATE, FERRITIN, TIBC, IRON, RETICCTPCT in the last 72 hours. Urine analysis:    Component Value Date/Time   COLORURINE YELLOW 04/08/2018 1802   APPEARANCEUR CLOUDY (A) 04/08/2018 1802   LABSPEC 1.010 04/08/2018 1802   PHURINE 6.0 04/08/2018 1802   GLUCOSEU 50 (A) 04/08/2018 1802   GLUCOSEU NEGATIVE 09/30/2008 0851   HGBUR LARGE (A) 04/08/2018 1802   HGBUR trace-lysed 03/29/2010 0940   BILIRUBINUR NEGATIVE 04/08/2018 1802   BILIRUBINUR neg 01/13/2014 1141   KETONESUR NEGATIVE 04/08/2018 1802   PROTEINUR 100 (A) 04/08/2018 1802   UROBILINOGEN 1.0 01/13/2014 1141   UROBILINOGEN 0.2 03/29/2010 0940  NITRITE NEGATIVE 04/08/2018 1802   LEUKOCYTESUR LARGE (A) 04/08/2018 1802   Sepsis Labs: @LABRCNTIP (procalcitonin:4,lacticidven:4)  ) Recent Results (from the past 240 hour(s))  Urine culture     Status: Abnormal   Collection Time: 04/08/18  6:02 PM  Result Value Ref Range Status   Specimen Description   Final    URINE, CATHETERIZED Performed at [facility], [address], [place], [place] [address]    Special Requests   Final    NONE Performed at [facility], [address] [place], [place] [address]    Culture (A)  Final    <10,000 COLONIES/mL  INSIGNIFICANT GROWTH Performed at [facility], [address] [place], [place] [address]    Report Status 04/10/2018 FINAL  Final  Culture, blood (routine x 2)     Status: None   Collection Time: 04/08/18 11:19 PM  Result Value Ref Range Status   Specimen Description   Final    BLOOD RIGHT HAND Performed at [facility], [address], [place], [place] [address]    Special Requests   Final    BOTTLES DRAWN AEROBIC AND ANAEROBIC Blood Culture adequate volume Performed at [facility], [address] [place], [place] [address]    Culture   Final    NO GROWTH 5 DAYS Performed at [facility], [address] [place], [place] [address]    Report Status 04/14/2018 FINAL  Final  Culture, blood (routine x 2)     Status: None   Collection Time: 04/08/18 11:19 PM  Result Value Ref Range Status   Specimen Description   Final    BLOOD RIGHT FOREARM Performed at [facility], [address] [place], [place] [address]    Special Requests   Final    AEROBIC BOTTLE ONLY Blood Culture adequate volume Performed at [facility], [address], [place], [place] [address]    Culture   Final    NO GROWTH 5 DAYS Performed at [facility], [address] [place], [place] [address]    Report Status 04/14/2018 FINAL  Final      Studies: No results found.  Scheduled Meds: . amLODipine  5 mg Oral Daily  . aspirin EC  81 mg Oral Daily  . atorvastatin  40 mg Oral Daily  . DULoxetine  60 mg Oral Daily  . feeding supplement (GLUCERNA SHAKE)  237 mL Oral TID BM  . heparin  5,000 Units Subcutaneous Q8H  . insulin aspart  0-15 Units Subcutaneous TID WC  . insulin aspart  0-5 Units Subcutaneous QHS  . insulin aspart  5 Units Subcutaneous TID WC  . insulin glargine  20 Units Subcutaneous QHS  . isosorbide mononitrate  30 mg Oral Daily  . metoprolol tartrate  50 mg Oral BID  . mometasone-formoterol  2 puff Inhalation BID  .  sodium chloride flush  3 mL Intravenous Q12H  . tamsulosin  0.4 mg Oral QPC breakfast  . vitamin B-12  1,000 mcg Oral Daily    Continuous Infusions: . sodium chloride 10 mL/hr at 04/12/18 2200  . ceFEPime (MAXIPIME) IV 1 g (04/14/18 2440)     LOS: 5 days     Briant Cedar, MD [facility]   If 7PM-7AM, please contact night-coverage [email or web address] 04/14/2018, 5:10 PM

## 2018-04-14 NOTE — Care Management Important Message (Signed)
Important Message  Patient Details  Name: Jeremy Gill MRN: [id number] Date of Birth: 12/15/1945   Medicare Important Message Given:  Yes    Caren MacadamFuller, Romulus Hanrahan 04/14/2018, 1:34 PMImportant Message  Patient Details  Name: Jeremy Gill MRN: [id number] Date of Birth: 12/15/1945   Medicare Important Message Given:  Yes    Caren MacadamFuller, Damian Hofstra 04/14/2018, 1:34 PM

## 2018-04-14 NOTE — NC FL2 (Signed)
[place] MEDICAID FL2 LEVEL OF CARE SCREENING TOOL     IDENTIFICATION  Patient Name: Jeremy Gill Birthdate: [birth year] Sex: male Admission Date (Current Location): 04/08/2018  [facility] and [place] Number:  Producer, television/film/video and Address:  [facility],  [address] [place], [place] [address]      Provider Number: [id number]  Attending Physician Name and Address:  Briant Cedar, MD  Relative Name and Phone Number:  Tylik Treese: [phone]    Current Level of Care: Hospital Recommended Level of Care: Skilled Nursing Facility Prior Approval Number:    Date Approved/Denied:   PASRR Number: [id number]  Discharge Plan: SNF    Current Diagnoses: Patient Active Problem List   Diagnosis Date Noted  . UTI (urinary tract infection) 04/09/2018  . Community acquired pneumonia 04/03/2018  . NSTEMI (non-ST elevated myocardial infarction) (HCC) 03/25/2018  . Multi-infarct dementia (HCC) 03/25/2018  . Hypomagnesemia 03/25/2018  . Folate deficiency 03/16/2018  . Elevated troponin 03/14/2018  . Abnormal EKG 03/14/2018  . Chest pain 03/14/2018  . Oral candidiasis 03/14/2018  . Dysphagia 03/14/2018  . Malnutrition of moderate degree 03/14/2018  . HCAP (healthcare-associated pneumonia) 03/13/2018  . Abnormality of gait 02/27/2018  . Bilateral lower extremity edema 02/05/2018  . Pruritus of skin 02/05/2018  . Protein-calorie malnutrition, severe 01/16/2018  . Acute urinary retention 01/13/2018  . Acute encephalopathy 01/13/2018  . Obstipation 01/13/2018  . Chronic pain syndrome 11/01/2017  . Pressure injury of skin 10/02/2017  . Anemia   . Depression 09/30/2017  . Acute renal failure superimposed on stage 3 chronic kidney disease (HCC) 09/30/2017  . Sepsis (HCC) 09/30/2017  . Hypokalemia 09/30/2017  . Hyperlipidemia associated with type 2 diabetes mellitus (HCC) 02/05/2017  . AKI (acute kidney injury) (HCC) 12/19/2016  . Acute metabolic  encephalopathy 12/19/2016  . DKA (diabetic ketoacidoses) (HCC) 12/18/2016  . Hyperkalemia 12/18/2016  . Anxiety disorder, unspecified 02/08/2016  . Low back pain with radiation 01/23/2016  . Chronic pain disorder 11/30/2015  . Generalized osteoarthritis of multiple sites 11/30/2015  . DYSHIDROSIS 06/08/2008  . VENOUS INSUFFICIENCY 11/25/2007  . 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 TOBACCO ABUSE 07/08/2007  . Diabetic polyneuropathy (HCC) 04/08/2007  . 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 Hypertension associated with diabetes (HCC) 10/25/2006  . COPD (chronic obstructive pulmonary disease) (HCC) 10/25/2006    Orientation RESPIRATION BLADDER Height & Weight     Self  O2(1L) Incontinent, Indwelling catheter Weight: 154 lb 5.2 oz (70 kg) Height:  6' (182.9 cm)  BEHAVIORAL SYMPTOMS/MOOD NEUROLOGICAL BOWEL NUTRITION STATUS      Continent Diet(carb modified)  AMBULATORY STATUS COMMUNICATION OF NEEDS Skin   Extensive Assist Verbally                         Personal Care Assistance Level of Assistance  Bathing, Feeding, Dressing Bathing Assistance: Limited assistance Feeding assistance: Limited assistance Dressing Assistance: Limited assistance     Functional Limitations Info  Sight, Hearing, Speech Sight Info: Adequate Hearing Info: Adequate Speech Info: Adequate    SPECIAL CARE FACTORS FREQUENCY  PT (By licensed PT), OT (By licensed OT)     PT Frequency: 5x wk OT Frequency: 5x wk            Contractures Contractures Info: Not present    Additional Factors Info  Code Status, Allergies, Psychotropic Code Status Info: Full Code Allergies Info: NKA Psychotropic Info: Cymbalta  Current Medications (04/14/2018):  This is the current hospital active medication list Current Facility-Administered Medications  Medication Dose Route Frequency Provider Last Rate Last Dose  . 0.9 %  sodium chloride infusion    Intravenous PRN Briant Cedar, MD 10 mL/hr at 04/12/18 2200    . acetaminophen (TYLENOL) suppository 650 mg  650 mg Rectal Q8H PRN Briant Cedar, MD   650 mg at 04/09/18 1905  . amLODipine (NORVASC) tablet 5 mg  5 mg Oral Daily Briant Cedar, MD   5 mg at 04/14/18 0825  . aspirin EC tablet 81 mg  81 mg Oral Daily Elder Love, MD   81 mg at 04/14/18 1610  . atorvastatin (LIPITOR) tablet 40 mg  40 mg Oral Daily Elder Love, MD   40 mg at 04/14/18 9604  . ceFEPIme (MAXIPIME) 1 g in sodium chloride 0.9 % 100 mL IVPB  1 g Intravenous Q12H Pham, Anh P, RPH 200 mL/hr at 04/14/18 0832 1 g at 04/14/18 0832  . DULoxetine (CYMBALTA) DR capsule 60 mg  60 mg Oral Daily Elder Love, MD   60 mg at 04/13/18 1022  . feeding supplement (GLUCERNA SHAKE) (GLUCERNA SHAKE) liquid 237 mL  237 mL Oral TID BM Briant Cedar, MD   237 mL at 04/13/18 1937  . heparin injection 5,000 Units  5,000 Units Subcutaneous Q8H Elder Love, MD   5,000 Units at 04/14/18 [phone]  . hydrALAZINE (APRESOLINE) injection 10 mg  10 mg Intravenous Q8H PRN Briant Cedar, MD   10 mg at 04/10/18 0105  . insulin aspart (novoLOG) injection 0-15 Units  0-15 Units Subcutaneous TID WC Briant Cedar, MD   8 Units at 04/14/18 [phone]  . insulin aspart (novoLOG) injection 0-5 Units  0-5 Units Subcutaneous QHS Briant Cedar, MD   3 Units at 04/13/18 2151  . insulin aspart (novoLOG) injection 5 Units  5 Units Subcutaneous TID WC Briant Cedar, MD   5 Units at 04/14/18 [phone]  . insulin glargine (LANTUS) injection 20 Units  20 Units Subcutaneous QHS Briant Cedar, MD   20 Units at 04/13/18 2137  . ipratropium-albuterol (DUONEB) 0.5-2.5 (3) MG/3ML nebulizer solution 3 mL  3 mL Nebulization Q6H PRN Elder Love, MD      . isosorbide mononitrate (IMDUR) 24 hr tablet 30 mg  30 mg Oral Daily Briant Cedar, MD   30 mg at 04/14/18 0824  . metoprolol tartrate (LOPRESSOR)  tablet 50 mg  50 mg Oral BID Briant Cedar, MD   50 mg at 04/14/18 0825  . mometasone-formoterol (DULERA) 200-5 MCG/ACT inhaler 2 puff  2 puff Inhalation BID Elder Love, MD   2 puff at 04/14/18 0801  . sodium chloride flush (NS) 0.9 % injection 3 mL  3 mL Intravenous Q12H Elder Love, MD   3 mL at 04/13/18 2153  . tamsulosin (FLOMAX) capsule 0.4 mg  0.4 mg Oral QPC breakfast Briant Cedar, MD   0.4 mg at 04/14/18 0825  . traMADol (ULTRAM) tablet 25 mg  25 mg Oral Q8H PRN Briant Cedar, MD   25 mg at 04/13/18 1234  . vitamin B-12 (CYANOCOBALAMIN) tablet 1,000 mcg  1,000 mcg Oral Daily Elder Love, MD   1,000 mcg at 04/14/18 2956     Discharge Medications: Please see discharge summary for a list of discharge medications.  Relevant Imaging Results:  Relevant Lab Results:   Additional Information SSN:  [id number]  Althea CharonAshley C Shinita Mac, LCSW

## 2018-04-14 NOTE — Clinical Social Work Note (Signed)
Clinical Social Work Assessment  Patient Details  Name: Jeremy Gill MRN: [id number] Date of Birth: 1945-06-21  Date of referral:  04/14/18               Reason for consult:  Facility Placement                Permission sought to share information with:  Facility Art therapist granted to share information::  Yes, Verbal Permission Granted  Name::     Marquise Wicke  Agency::  SNF  Relationship::  spouse  Contact Information:  [phone]  Housing/Transportation Living arrangements for the past 2 months:  Single Family Home Source of Information:  Patient, Spouse Patient Interpreter Needed:  None Criminal Activity/Legal Involvement Pertinent to Current Situation/Hospitalization:  No - Comment as needed Significant Relationships:  Adult Children, Other Family Members, Spouse Lives with:  Spouse Do you feel safe going back to the place where you live?  Yes Need for family participation in patient care:  Yes (Comment)  Care giving concerns:  CSW met patient and family at bedside. Patient lives at home with wife. Patients wife did assessment with CSW as patient listened intently.    Social Worker assessment / plan:  CSW spoke with family about discharge plans. Patient and family aware that they have already used 20 days of there rehab stay at [facility]. Family aware that if they go back to [facility] they will have a copay starting the first day. Patients spouse stated she will be agreeable with patient still discharging to rehab because she will be unable to take care of him at home. Family gave CSW verbal permission to start authorization through insurance .  Employment status:  Retired Nurse, adult PT Recommendations:  [place] / Referral to community resources:  [place]  Patient/Family's Response to care:  Family supportive of patients needs  Patient/Family's Understanding of and  Emotional Response to Diagnosis, Current Treatment, and Prognosis:  Patient wanting to go to snf  Emotional Assessment Appearance:  Appears stated age Attitude/Demeanor/Rapport:  Engaged Affect (typically observed):  Accepting Orientation:  Oriented to Self Alcohol / Substance use:  Not Applicable Psych involvement (Current and /or in the community):  No (Comment)  Discharge Needs  Concerns to be addressed:  Care Coordination Readmission within the last 30 days:  No Current discharge risk:  Physical Impairment Barriers to Discharge:  Continued Medical Work up, [place], LCSW 04/14/2018, 11:33 AM

## 2018-04-15 ENCOUNTER — Encounter (HOSPITAL_COMMUNITY): Payer: Self-pay

## 2018-04-15 DIAGNOSIS — E1149 Type 2 diabetes mellitus with other diabetic neurological complication: Secondary | ICD-10-CM | POA: Diagnosis not present

## 2018-04-15 DIAGNOSIS — R2681 Unsteadiness on feet: Secondary | ICD-10-CM | POA: Diagnosis not present

## 2018-04-15 DIAGNOSIS — Z7401 Bed confinement status: Secondary | ICD-10-CM | POA: Diagnosis not present

## 2018-04-15 DIAGNOSIS — Z515 Encounter for palliative care: Secondary | ICD-10-CM

## 2018-04-15 DIAGNOSIS — I2510 Atherosclerotic heart disease of native coronary artery without angina pectoris: Secondary | ICD-10-CM | POA: Diagnosis not present

## 2018-04-15 DIAGNOSIS — I959 Hypotension, unspecified: Secondary | ICD-10-CM | POA: Diagnosis not present

## 2018-04-15 DIAGNOSIS — E1140 Type 2 diabetes mellitus with diabetic neuropathy, unspecified: Secondary | ICD-10-CM | POA: Diagnosis not present

## 2018-04-15 DIAGNOSIS — I10 Essential (primary) hypertension: Secondary | ICD-10-CM | POA: Diagnosis not present

## 2018-04-15 DIAGNOSIS — L89610 Pressure ulcer of right heel, unstageable: Secondary | ICD-10-CM | POA: Diagnosis not present

## 2018-04-15 DIAGNOSIS — J189 Pneumonia, unspecified organism: Secondary | ICD-10-CM

## 2018-04-15 DIAGNOSIS — E1151 Type 2 diabetes mellitus with diabetic peripheral angiopathy without gangrene: Secondary | ICD-10-CM | POA: Diagnosis not present

## 2018-04-15 DIAGNOSIS — F0390 Unspecified dementia without behavioral disturbance: Secondary | ICD-10-CM | POA: Diagnosis not present

## 2018-04-15 DIAGNOSIS — A419 Sepsis, unspecified organism: Secondary | ICD-10-CM | POA: Diagnosis not present

## 2018-04-15 DIAGNOSIS — N183 Chronic kidney disease, stage 3 (moderate): Secondary | ICD-10-CM | POA: Diagnosis not present

## 2018-04-15 DIAGNOSIS — R000 Tachycardia, unspecified: Secondary | ICD-10-CM | POA: Diagnosis not present

## 2018-04-15 DIAGNOSIS — L89612 Pressure ulcer of right heel, stage 2: Secondary | ICD-10-CM | POA: Diagnosis not present

## 2018-04-15 DIAGNOSIS — L89153 Pressure ulcer of sacral region, stage 3: Secondary | ICD-10-CM | POA: Diagnosis not present

## 2018-04-15 DIAGNOSIS — J181 Lobar pneumonia, unspecified organism: Secondary | ICD-10-CM | POA: Diagnosis not present

## 2018-04-15 DIAGNOSIS — E43 Unspecified severe protein-calorie malnutrition: Secondary | ICD-10-CM | POA: Diagnosis not present

## 2018-04-15 DIAGNOSIS — I129 Hypertensive chronic kidney disease with stage 1 through stage 4 chronic kidney disease, or unspecified chronic kidney disease: Secondary | ICD-10-CM | POA: Diagnosis not present

## 2018-04-15 DIAGNOSIS — R509 Fever, unspecified: Secondary | ICD-10-CM | POA: Diagnosis not present

## 2018-04-15 DIAGNOSIS — J690 Pneumonitis due to inhalation of food and vomit: Secondary | ICD-10-CM | POA: Diagnosis not present

## 2018-04-15 DIAGNOSIS — L89621 Pressure ulcer of left heel, stage 1: Secondary | ICD-10-CM | POA: Diagnosis not present

## 2018-04-15 DIAGNOSIS — E876 Hypokalemia: Secondary | ICD-10-CM | POA: Diagnosis not present

## 2018-04-15 DIAGNOSIS — E7849 Other hyperlipidemia: Secondary | ICD-10-CM | POA: Diagnosis not present

## 2018-04-15 DIAGNOSIS — F17290 Nicotine dependence, other tobacco product, uncomplicated: Secondary | ICD-10-CM | POA: Diagnosis not present

## 2018-04-15 DIAGNOSIS — R404 Transient alteration of awareness: Secondary | ICD-10-CM | POA: Diagnosis not present

## 2018-04-15 DIAGNOSIS — J180 Bronchopneumonia, unspecified organism: Secondary | ICD-10-CM | POA: Diagnosis not present

## 2018-04-15 DIAGNOSIS — R402232 Coma scale, best verbal response, inappropriate words, at arrival to emergency department: Secondary | ICD-10-CM | POA: Diagnosis not present

## 2018-04-15 DIAGNOSIS — E1142 Type 2 diabetes mellitus with diabetic polyneuropathy: Secondary | ICD-10-CM | POA: Diagnosis not present

## 2018-04-15 DIAGNOSIS — I69828 Other speech and language deficits following other cerebrovascular disease: Secondary | ICD-10-CM | POA: Diagnosis not present

## 2018-04-15 DIAGNOSIS — Z833 Family history of diabetes mellitus: Secondary | ICD-10-CM | POA: Diagnosis not present

## 2018-04-15 DIAGNOSIS — D649 Anemia, unspecified: Secondary | ICD-10-CM | POA: Diagnosis not present

## 2018-04-15 DIAGNOSIS — N390 Urinary tract infection, site not specified: Secondary | ICD-10-CM | POA: Diagnosis not present

## 2018-04-15 DIAGNOSIS — R4182 Altered mental status, unspecified: Secondary | ICD-10-CM | POA: Diagnosis not present

## 2018-04-15 DIAGNOSIS — J9601 Acute respiratory failure with hypoxia: Secondary | ICD-10-CM | POA: Diagnosis not present

## 2018-04-15 DIAGNOSIS — R279 Unspecified lack of coordination: Secondary | ICD-10-CM | POA: Diagnosis not present

## 2018-04-15 DIAGNOSIS — M6281 Muscle weakness (generalized): Secondary | ICD-10-CM | POA: Diagnosis not present

## 2018-04-15 DIAGNOSIS — M159 Polyosteoarthritis, unspecified: Secondary | ICD-10-CM | POA: Diagnosis not present

## 2018-04-15 DIAGNOSIS — E1122 Type 2 diabetes mellitus with diabetic chronic kidney disease: Secondary | ICD-10-CM | POA: Diagnosis not present

## 2018-04-15 DIAGNOSIS — Z8701 Personal history of pneumonia (recurrent): Secondary | ICD-10-CM | POA: Diagnosis not present

## 2018-04-15 DIAGNOSIS — R338 Other retention of urine: Secondary | ICD-10-CM | POA: Diagnosis not present

## 2018-04-15 DIAGNOSIS — E11649 Type 2 diabetes mellitus with hypoglycemia without coma: Secondary | ICD-10-CM | POA: Diagnosis not present

## 2018-04-15 DIAGNOSIS — G9341 Metabolic encephalopathy: Secondary | ICD-10-CM | POA: Diagnosis not present

## 2018-04-15 DIAGNOSIS — R402142 Coma scale, eyes open, spontaneous, at arrival to emergency department: Secondary | ICD-10-CM | POA: Diagnosis not present

## 2018-04-15 DIAGNOSIS — R0689 Other abnormalities of breathing: Secondary | ICD-10-CM | POA: Diagnosis not present

## 2018-04-15 DIAGNOSIS — M549 Dorsalgia, unspecified: Secondary | ICD-10-CM | POA: Diagnosis not present

## 2018-04-15 DIAGNOSIS — Z9181 History of falling: Secondary | ICD-10-CM | POA: Diagnosis not present

## 2018-04-15 DIAGNOSIS — N401 Enlarged prostate with lower urinary tract symptoms: Secondary | ICD-10-CM | POA: Diagnosis not present

## 2018-04-15 DIAGNOSIS — R531 Weakness: Secondary | ICD-10-CM | POA: Diagnosis not present

## 2018-04-15 DIAGNOSIS — Z7982 Long term (current) use of aspirin: Secondary | ICD-10-CM | POA: Diagnosis not present

## 2018-04-15 DIAGNOSIS — E1169 Type 2 diabetes mellitus with other specified complication: Secondary | ICD-10-CM | POA: Diagnosis not present

## 2018-04-15 DIAGNOSIS — R0902 Hypoxemia: Secondary | ICD-10-CM | POA: Diagnosis not present

## 2018-04-15 DIAGNOSIS — R1312 Dysphagia, oropharyngeal phase: Secondary | ICD-10-CM | POA: Diagnosis not present

## 2018-04-15 DIAGNOSIS — Z809 Family history of malignant neoplasm, unspecified: Secondary | ICD-10-CM | POA: Diagnosis not present

## 2018-04-15 DIAGNOSIS — M2550 Pain in unspecified joint: Secondary | ICD-10-CM | POA: Diagnosis not present

## 2018-04-15 DIAGNOSIS — Z7951 Long term (current) use of inhaled steroids: Secondary | ICD-10-CM | POA: Diagnosis not present

## 2018-04-15 DIAGNOSIS — E1159 Type 2 diabetes mellitus with other circulatory complications: Secondary | ICD-10-CM | POA: Diagnosis not present

## 2018-04-15 DIAGNOSIS — E1165 Type 2 diabetes mellitus with hyperglycemia: Secondary | ICD-10-CM | POA: Diagnosis not present

## 2018-04-15 DIAGNOSIS — J449 Chronic obstructive pulmonary disease, unspecified: Secondary | ICD-10-CM | POA: Diagnosis not present

## 2018-04-15 DIAGNOSIS — Z87440 Personal history of urinary (tract) infections: Secondary | ICD-10-CM | POA: Diagnosis not present

## 2018-04-15 DIAGNOSIS — L89150 Pressure ulcer of sacral region, unstageable: Secondary | ICD-10-CM | POA: Diagnosis not present

## 2018-04-15 DIAGNOSIS — N138 Other obstructive and reflux uropathy: Secondary | ICD-10-CM | POA: Diagnosis not present

## 2018-04-15 DIAGNOSIS — G8929 Other chronic pain: Secondary | ICD-10-CM | POA: Diagnosis not present

## 2018-04-15 DIAGNOSIS — I252 Old myocardial infarction: Secondary | ICD-10-CM | POA: Diagnosis not present

## 2018-04-15 DIAGNOSIS — R402312 Coma scale, best motor response, none, at arrival to emergency department: Secondary | ICD-10-CM | POA: Diagnosis not present

## 2018-04-15 DIAGNOSIS — N189 Chronic kidney disease, unspecified: Secondary | ICD-10-CM | POA: Diagnosis not present

## 2018-04-15 DIAGNOSIS — N179 Acute kidney failure, unspecified: Secondary | ICD-10-CM | POA: Diagnosis not present

## 2018-04-15 DIAGNOSIS — Z794 Long term (current) use of insulin: Secondary | ICD-10-CM | POA: Diagnosis not present

## 2018-04-15 DIAGNOSIS — D529 Folate deficiency anemia, unspecified: Secondary | ICD-10-CM | POA: Diagnosis not present

## 2018-04-15 DIAGNOSIS — L409 Psoriasis, unspecified: Secondary | ICD-10-CM | POA: Diagnosis not present

## 2018-04-15 LAB — CBC WITH DIFFERENTIAL/PLATELET
Abs Immature Granulocytes: 0.1 10*3/uL — ABNORMAL HIGH (ref 0.00–0.07)
BASOS ABS: 0.1 10*3/uL (ref 0.0–0.1)
Basophils Relative: 0 %
EOS ABS: 0.6 10*3/uL — AB (ref 0.0–0.5)
EOS PCT: 5 %
HCT: 27 % — ABNORMAL LOW (ref 39.0–52.0)
Hemoglobin: 8.7 g/dL — ABNORMAL LOW (ref 13.0–17.0)
IMMATURE GRANULOCYTES: 1 %
LYMPHS ABS: 2.6 10*3/uL (ref 0.7–4.0)
LYMPHS PCT: 21 %
MCH: 29.2 pg (ref 26.0–34.0)
MCHC: 32.2 g/dL (ref 30.0–36.0)
MCV: 90.6 fL (ref 80.0–100.0)
Monocytes Absolute: 0.7 10*3/uL (ref 0.1–1.0)
Monocytes Relative: 6 %
NEUTROS PCT: 67 %
NRBC: 0 % (ref 0.0–0.2)
Neutro Abs: 8.4 10*3/uL — ABNORMAL HIGH (ref 1.7–7.7)
Platelets: 320 10*3/uL (ref 150–400)
RBC: 2.98 MIL/uL — ABNORMAL LOW (ref 4.22–5.81)
RDW: 13.4 % (ref 11.5–15.5)
WBC: 12.5 10*3/uL — ABNORMAL HIGH (ref 4.0–10.5)

## 2018-04-15 LAB — BASIC METABOLIC PANEL
Anion gap: 8 (ref 5–15)
BUN: 34 mg/dL — ABNORMAL HIGH (ref 8–23)
CALCIUM: 8.5 mg/dL — AB (ref 8.9–10.3)
CO2: 28 mmol/L (ref 22–32)
CREATININE: 1.34 mg/dL — AB (ref 0.61–1.24)
Chloride: 99 mmol/L (ref 98–111)
GFR, EST AFRICAN AMERICAN: 59 mL/min — AB (ref >60)
GFR, EST NON AFRICAN AMERICAN: 51 mL/min — AB (ref >60)
Glucose, Bld: 275 mg/dL — ABNORMAL HIGH (ref 70–99)
Potassium: 3.6 mmol/L (ref 3.5–5.1)
SODIUM: 135 mmol/L (ref 135–145)

## 2018-04-15 LAB — GLUCOSE, CAPILLARY
GLUCOSE-CAPILLARY: 381 mg/dL — AB (ref 70–99)
GLUCOSE-CAPILLARY: 319 mg/dL — AB (ref 70–99)
Glucose-Capillary: 156 mg/dL — ABNORMAL HIGH (ref 70–99)
Glucose-Capillary: 267 mg/dL — ABNORMAL HIGH (ref 70–99)
Glucose-Capillary: 293 mg/dL — ABNORMAL HIGH (ref 70–99)

## 2018-04-15 MED ORDER — TRAMADOL HCL 50 MG PO TABS: 25.0000 mg | ORAL_TABLET | Freq: Three times a day (TID) | ORAL | 0 refills | Status: DC | PRN

## 2018-04-15 MED ORDER — AMOXICILLIN-POT CLAVULANATE 875-125 MG PO TABS: 1.0000 | ORAL_TABLET | Freq: Two times a day (BID) | ORAL | 0 refills | Status: AC

## 2018-04-15 MED ORDER — BACLOFEN 10 MG PO TABS: 5.0000 mg | ORAL_TABLET | Freq: Three times a day (TID) | ORAL | 1 refills | Status: AC | PRN

## 2018-04-15 MED ORDER — HYDROCODONE-ACETAMINOPHEN 5-325 MG PO TABS
2.0000 | ORAL_TABLET | ORAL | Status: AC
  Administered 2018-04-15: 2 via ORAL
  Filled 2018-04-15: qty 2

## 2018-04-15 MED ORDER — HYDROCODONE-ACETAMINOPHEN 5-325 MG PO TABS: 1.0000 | ORAL_TABLET | Freq: Three times a day (TID) | ORAL | 0 refills | Status: DC

## 2018-04-15 MED ORDER — AMLODIPINE BESYLATE 5 MG PO TABS: 5.0000 mg | ORAL_TABLET | Freq: Every day | ORAL | Status: DC

## 2018-04-15 NOTE — Care Management Note (Signed)
Case Management Note  Patient Details  Name: Lazarus SalinesJames E Richart MRN: [id number] Date of Birth: 04-02-1946  Subjective/Objective:                    Action/Plan:D/C SNF.   Expected Discharge Date:  04/15/18               Expected Discharge Plan:  Skilled Nursing Facility  In-House Referral:  Clinical Social Work  Discharge planning Services     Post Acute Care Choice:    Choice offered to:     DME Arranged:    DME Agency:     HH Arranged:    HH Agency:     Status of Service:  Completed, signed off  If discussed at [employer] Meetings, dates discussed:    Additional Comments:  Lanier ClamMahabir, Auriel Kist, RN 04/15/2018, 1:15 PM

## 2018-04-15 NOTE — Progress Notes (Signed)
Pt removed Prevalon boots, this RN atempted to place boots back on the patients feet but the patient refused stating, "Don't put those back on my feet!" Pt was agreeable to floating heels on a pillow. Will continue to monitor.

## 2018-04-15 NOTE — Discharge Summary (Signed)
Discharge Summary  Jeremy Gill:[id number] DOB: Dec 18, 1945  PCP: [place], [name], MD  Admit date: 04/08/2018 Discharge date: 04/15/2018  Time spent: 35 mins  Recommendations for Outpatient Follow-up:  1. PCP   Discharge Diagnoses:  Active Hospital Problems   Diagnosis Date Noted  . UTI (urinary tract infection) 04/09/2018    Resolved Hospital Problems  No resolved problems to display.    Discharge Condition: Stable  Diet recommendation: Heart healthy/mod carb  Vitals:   04/15/18 0432 04/15/18 1022  BP: (!) 149/86   Pulse: 87 79  Resp: 16 18  Temp: 98.2 F (36.8 C)   SpO2: 97% 97%    History of present illness:  This is a 72 year old man with medical problems including insulin-dependent diabetes(A1c of 9.1% in 12/2017),hypertension, BPH with most recently urinary retention requiring Foley catheter placement, chronic low back pain, who presents from his home with confusion and decreased responsiveness. Significant recent events include hospital stay from October 17 through March 18, 2018 where acute problems included pneumonia,NSTEMImedically managed, urinary retention requiring placement of Foley, acute on chronic anemia requiring 2 units of packed red blood cells. Transferred to a skilled nursing facility for rehab, and was discharged home about 3 days ago. History was obtained via discussion with the patient's spouse as the patient currently is confused. Upon arrival home from his rehab facility, he was walking with a 4 wheeled walker and needing some help with his ADLs. The spouse reports 2 months ago he was independent in his ADLs and enjoyed doing work around the house. Prior to presentation, he wasfound to be lethargic and could not walk. His spouse reports he slid out of his walker onto the ground, and was minimally responsive therefore prompting EMS call. In the ED, found to have leukocytosis with white count of 17.6, creatinine of 1.5, baseline creatinine  appears to be less than 1. Urinalysis with large leukocytes. Chest x-ray without obvious new focal infiltrate. Pt admitted for further management.   Pt remains more awake, alert, oriented, able to have discussions with me and family members. No new complaints. Stable to d/c to SNF for further rehab.  Hospital Course:  Active Problems:   UTI (urinary tract infection)  Acute metabolic encephalopathy likely 2/2 sepsis from ??unstageable sacral ulcer Vs HCAP Vs UTI Vs Polypharmacy (on multiple sedating meds with poor renal function) Last temp spike was 04/09/18, currently afebrile, with fluctuating leukocytosis Lactic acid WNL Procalcitonin 0.52-->0.35-->0.36 UA showed large leukocytes, >50 WBC UC with <10,000 colonies, insignificant growth BC x2 NGTD Chest x-ray shows improvement in left lower lobe pneumonia, atelectasis CT abdomen/pelvis: Bilateral kidney stones without obstruction, distended urinary bladder despite Foley catheter with bladder wall thickening.  Nonspecific pulmonary nodules, noncontrast chest CT 3 to 6 months is recommended CT head negative for any acute intracranial process MRI of the brain negative for any acute intracranial process EEG with low voltage, polymorphic delta activity that is diffusely distributed S/P IV Vancomycin and Cefepime, will switch to PO Augmentin for for a total of 7 days of antibiotics, last dose on 04/17/18  Elevated d-dimer VQ scan very low probability for PE  Diabetes mellitus type 2 Episode of hypoglycemia on 04/09/18, now with hyperglycemia Last A1c 8.8 on 04/12/18 Continue SSI, novolog TID with meal and QHS, increase lantus 25U QHS Discontinued home metformin due to AKI  AKI Slowly improving Baseline creatinine normal, on admission 1.5, likely prerenal Vs NSAID/medication induced S/P IVF Follow up with repeat labs  Hypokalemia Replaced prn  Multiple  ulcers including unstageable sacral, stage I on left heel, stage II on  right heel WOC nurse on board, appreciate recommendations MRI sacrum ruled out osteomyelitis Continue antibiotics  Hypertension Stable Continue home lopressor, imdur, started amlodipine  Hyperlipidemia Continue statins  Normocytic anemia Hemoglobin at baseline, unclear etiology Daily CBC  BPH, with recent urinary retention Successful voiding trial, removed foley on 04/13/18 Continue flomax  Chronic back pain Restart home tramadol and flexeril at a reduced dose Discontinue gabapentin and diclofenac due to AKI      Procedures:  None  Consultations:  None   Discharge Exam: BP (!) 149/86 (BP Location: Left Arm)   Pulse 79   Temp 98.2 F (36.8 C) (Oral)   Resp 18   Ht 6' (1.829 m)   Wt 70 kg   SpO2 97%   BMI 20.93 kg/m   General: NAD, alert, awake  Cardiovascular: S1, S2 present Respiratory: CTAB  Discharge Instructions You were cared for by a hospitalist during your hospital stay. If you have any questions about your discharge medications or the care you received while you were in the hospital after you are discharged, you can call the unit and asked to speak with the hospitalist on call if the hospitalist that took care of you is not available. Once you are discharged, your primary care physician will handle any further medical issues. Please note that NO REFILLS for any discharge medications will be authorized once you are discharged, as it is imperative that you return to your primary care physician (or establish a relationship with a primary care physician if you do not have one) for your aftercare needs so that they can reassess your need for medications and monitor your lab values.   Allergies as of 04/15/2018   No Known Allergies     Medication List    STOP taking these medications   calcium carbonate 500 MG chewable tablet Commonly known as:  TUMS - dosed in mg elemental calcium   diclofenac 50 MG EC tablet Commonly known as:  VOLTAREN     gabapentin 600 MG tablet Commonly known as:  NEURONTIN   loratadine 10 MG tablet Commonly known as:  CLARITIN   metFORMIN 500 MG tablet Commonly known as:  GLUCOPHAGE   multivitamin with minerals Tabs tablet   Potassium Chloride ER 20 MEQ Tbcr   triamcinolone ointment 0.1 % Commonly known as:  KENALOG   vitamin C 500 MG tablet Commonly known as:  ASCORBIC ACID     TAKE these medications   ACCU-CHEK SOFTCLIX LANCETS lancets Use as instructed   amLODipine 5 MG tablet Commonly known as:  NORVASC Take 1 tablet (5 mg total) by mouth daily. Start taking on:  04/16/2018   amoxicillin-clavulanate 875-125 MG tablet Commonly known as:  AUGMENTIN Take 1 tablet by mouth 2 (two) times daily for 2 days.   aspirin EC 81 MG tablet Take 1 tablet (81 mg total) by mouth daily.   atorvastatin 40 MG tablet Commonly known as:  LIPITOR Take 1 tablet (40 mg total) by mouth daily.   baclofen 10 MG tablet Commonly known as:  LIORESAL Take 0.5 tablets (5 mg total) by mouth 3 (three) times daily as needed for muscle spasms. What changed:    how much to take  when to take this  reasons to take this   budesonide-formoterol 160-4.5 MCG/ACT inhaler Commonly known as:  SYMBICORT Inhale 2 puffs into the lungs 2 (two) times daily.   DULoxetine 60 MG capsule Commonly  known as:  CYMBALTA TAKE 1 CAPSULE BY MOUTH EVERY DAY What changed:    how much to take  how to take this  when to take this  additional instructions   folic acid 1 MG tablet Commonly known as:  FOLVITE Take 1 tablet (1 mg total) by mouth daily.   glucose blood test strip Use to test blood sugar three-four times daily.   insulin aspart 100 UNIT/ML injection Commonly known as:  novoLOG Inject 6 Units into the skin 3 (three) times daily with meals.   Insulin Glargine 100 UNIT/ML Solostar Pen Commonly known as:  LANTUS Inject 28 Units into the skin at bedtime. What changed:  how much to take   INSULIN  SYRINGE .5CC/31GX5/16" 31G X 5/16" 0.5 ML Misc USE AS DIRECTED WITH LANTUS   ipratropium-albuterol 0.5-2.5 (3) MG/3ML Soln Commonly known as:  DUONEB Take 3 mLs by nebulization every 6 (six) hours as needed. What changed:  reasons to take this   isosorbide mononitrate 30 MG 24 hr tablet Commonly known as:  IMDUR Take 1 tablet (30 mg total) by mouth daily.   metoprolol tartrate 50 MG tablet Commonly known as:  LOPRESSOR Take 1 tablet (50 mg total) by mouth 2 (two) times daily. What changed:    how much to take  when to take this   NONFORMULARY OR COMPOUNDED ITEM Apply 1 application topically 2 (two) times daily as needed (rash). 1:1 mixture of eucerin and triamcinolone 0.1% ointment   polyethylene glycol packet Commonly known as:  MIRALAX / GLYCOLAX Take 17 g by mouth daily. What changed:    when to take this  reasons to take this   tamsulosin 0.4 MG Caps capsule Commonly known as:  FLOMAX Take 1 capsule (0.4 mg total) by mouth daily after breakfast. NEED OV.   traMADol 50 MG tablet Commonly known as:  ULTRAM Take 0.5 tablets (25 mg total) by mouth every 8 (eight) hours as needed for up to 20 days for moderate pain. What changed:    how much to take  when to take this   vitamin B-12 1000 MCG tablet Commonly known as:  CYANOCOBALAMIN Take 1,000 mcg by mouth daily.      No Known Allergies Follow-up Information    [facility] Follow up.   Specialty:  Home Health Services Contact information: [address] [place] [address] [phone]        [place], [name], MD. Schedule an appointment as soon as possible for a visit in 1 week(s).   Specialty:  Family Medicine Contact information: [address] [place] [address] [phone]            The results of significant diagnostics from this hospitalization (including imaging, microbiology, ancillary and laboratory) are listed below for reference.     Significant Diagnostic Studies: Ct Abdomen Pelvis Wo Contrast  Result Date: 04/10/2018 CLINICAL DATA:  72 year old with flank pain and recurrent stone disease. EXAM: CT ABDOMEN AND PELVIS WITHOUT CONTRAST TECHNIQUE: Multidetector CT imaging of the abdomen and pelvis was performed following the standard protocol without IV contrast. COMPARISON:  CT 01/13/2018 FINDINGS: Lower chest: Extensive coronary artery calcifications. Small bilateral pleural effusions. 6 mm nodule in the right lower lobe on sequence 6, image 8. Patchy airspace densities in both lower lobes and a small amount of disease in the right middle lobe. Small peripheral nodular densities in left lower lobe on sequence 6, image 9. Hepatobiliary: New trace perihepatic ascites. Small low-density  structure in the anterior liver on sequence 2, image 32 is nonspecific but could represent a small cyst. No significant gallbladder distension. Pancreas: Unremarkable. No pancreatic ductal dilatation or surrounding inflammatory changes. Spleen: Normal in size without focal abnormality. Adrenals/Urinary Tract: Adrenal glands are within normal limits. Least 2 punctate nonobstructive right kidney stones. Approximately 4 punctate left kidney stones without hydronephrosis. No evidence for ureter dilatation. No definite ureter stones. The urinary bladder is moderately distended with wall thickening measuring roughly 6 mm. There is a Foley catheter within the urinary bladder. There may be calcifications along the posterior wall of the urinary bladder on sequence 2, image 69. Kidneys are mildly lobular and similar to the prior examination. Stomach/Bowel: Stomach is unremarkable. There is no significant bowel dilatation and no evidence for obstruction. Limited evaluation for bowel inflammation on this noncontrast examination. Study also has technical limitations due to streak artifact from the patient's arms and mild motion artifact. Vascular/Lymphatic: Large amount  of calcium at the origin of the SMA. Large amount of calcium at the origin of the right renal artery. Atherosclerotic calcifications in the abdominal aorta without aneurysm. Calcifications in the bilateral femoral arteries. No significant lymph node enlargement in the abdomen or pelvis. Reproductive: Prostate calcifications. Other: Diffuse subcutaneous edema. Evidence for mesenteric edema. A small amount of ascites in the pelvis and around the liver. Musculoskeletal: Mild disc space narrowing at L5-S1. No suspicious bone findings. IMPRESSION: 1. Small bilateral kidney stones without obstruction. 2. Distended urinary bladder despite having a Foley catheter. Bladder wall thickening is nonspecific but could be related to chronic bladder outlet obstruction or inflammation. 3. Subcutaneous edema with a small amount of ascites. Findings could be related to fluid overload or anasarca. 4. Small bilateral pleural effusions with patchy airspace and nodular disease at the lung bases. Findings are concerning for pneumonia or aspiration. 5. **An incidental finding of potential clinical significance has been found. Nonspecific pulmonary nodules. Largest pulmonary nodule measures 6 mm in the right lower lobe. Non-contrast chest CT at 3-6 months is recommended. If the nodules are stable at time of repeat CT, then future CT at 18-24 months (from today's scan) is considered optional for low-risk patients, but is recommended for high-risk patients. This recommendation follows the consensus statement: Guidelines for Management of Incidental Pulmonary Nodules Detected on CT Images: From the Fleischner Society 2017; Radiology 2017; 284:228-243.** 6. Aortic Atherosclerosis (ICD10-I70.0). Coronary artery calcifications. Electronically Signed   By: Richarda Overlie M.D.   On: 04/10/2018 09:35   Ct Head Wo Contrast  Result Date: 04/09/2018 CLINICAL DATA:  Altered level of consciousness. EXAM: CT HEAD WITHOUT CONTRAST TECHNIQUE: Contiguous  axial images were obtained from the base of the skull through the vertex without intravenous contrast. COMPARISON:  CT scan of March 13, 2018. FINDINGS: Brain: Mild diffuse cortical atrophy is noted. Mild chronic ischemic white matter disease is noted. No mass effect or midline shift is noted. Ventricular size is within normal limits. There is no evidence of mass lesion, hemorrhage or acute infarction. Vascular: No hyperdense vessel or unexpected calcification. Skull: Normal. Negative for fracture or focal lesion. Sinuses/Orbits: No acute finding. Other: None. IMPRESSION: Mild diffuse cortical atrophy. Mild chronic ischemic white matter disease. No acute intracranial abnormality seen. Electronically Signed   By: Lupita Raider, M.D.   On: 04/09/2018 13:11   Mr Laqueta Jean ZO Contrast  Result Date: 04/09/2018 CLINICAL DATA:  Altered mental status EXAM: MRI HEAD WITHOUT AND WITH CONTRAST TECHNIQUE: Multiplanar, multiecho pulse sequences of the brain  and surrounding structures were obtained without and with intravenous contrast. CONTRAST:  7 mL Gadavist COMPARISON:  Head CT 04/09/2018 FINDINGS: BRAIN: There is no acute infarct, acute hemorrhage, hydrocephalus or extra-axial collection. The midline structures are normal. No midline shift or other mass effect. 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 Diffuse confluent hyperintense T2-weighted signal within the periventricular, deep and juxtacortical white matter, most commonly due to chronic ischemic microangiopathy. Advanced atrophy for age. Susceptibility-sensitive sequences show no chronic microhemorrhage or superficial siderosis. VASCULAR: Major intracranial arterial and venous sinus flow voids are normal. SKULL AND UPPER CERVICAL SPINE: Calvarial bone marrow signal is normal. There is no skull base mass. Visualized upper cervical spine and soft tissues are normal. SINUSES/ORBITS: No fluid levels or advanced mucosal thickening. No mastoid  or middle ear effusion. The orbits are normal. IMPRESSION: 1. No acute intracranial abnormality. 2. Multiple old small vessel infarcts and advanced findings of chronic ischemic microangiopathy. Electronically Signed   By: Deatra Robinson M.D.   On: 04/09/2018 19:18   Mr Sacrum Si Joints Wo Contrast  Result Date: 04/10/2018 CLINICAL DATA:  Sepsis.  Sacral ulcer EXAM: MRI SACRUM WITHOUT CONTRAST TECHNIQUE: Multiplanar multisequence MR imaging of the pelvis was performed. No intravenous contrast was administered. COMPARISON:  CT scan 04/10/2018 FINDINGS: Marrow heterogeneity is observed in general with areas of accentuated T2 signal in the iliac bones and sacrum likewise having accentuated T1 signal. Some of this may be from red marrow redistribution. Given the high T1 signal this would not be a characteristic appearance for infiltrative malignancy or osteomyelitis, both of which would tend to have low T1 signal. No bony destructive findings characteristic of osteomyelitis. There is mild thinning of the soft tissues overlying the lower sacrum with some bandaging in this vicinity possibly reflecting a wound. Again, no definite bony destructive findings. There is edema tracking in a generalized fashion along fascia planes in the pelvis, including the gluteal musculature, hip adductor musculature, presacral region, and pelvic floor. This is ascribed to third spacing of fluid. Foley catheter is present in the urinary bladder. Trace pelvic ascites. IMPRESSION: 1. No current compelling findings of sacral or coccygeal osteomyelitis. There is potentially some thinning of the dorsal soft tissues overlying the lower sacrum. 2. Marrow heterogeneity is present. The most common causes include anemia, smoking, obesity, or advancing age. Third spacing of fluid along tissue planes along with a small amount of pelvic ascites. Electronically Signed   By: Gaylyn Rong M.D.   On: 04/10/2018 18:45   Nm Pulmonary Perf And  Vent  Result Date: 04/10/2018 CLINICAL DATA:  Shortness of breath with elevated D-dimer EXAM: NUCLEAR MEDICINE VENTILATION - PERFUSION LUNG SCAN VIEWS: Anterior, posterior, left lateral, right lateral, RPO, LPO, RAO, LAO-ventilation and perfusion RADIOPHARMACEUTICALS:  29.14 mCi of Tc-29m DTPA aerosol inhalation and 4.3 mCi Tc32m MAA IV COMPARISON:  Chest radiograph April 08, 2018 FINDINGS: Ventilation: Radiotracer uptake is homogeneous and symmetric bilaterally. No ventilation defects are evident. Perfusion: Radiotracer uptake is homogeneous and symmetric bilaterally. No perfusion defects are evident. IMPRESSION: No appreciable ventilation or perfusion defects. Very low probability of pulmonary embolus. Electronically Signed   By: Bretta Bang III M.D.   On: 04/10/2018 10:17   Dg Chest Port 1 View  Result Date: 04/08/2018 CLINICAL DATA:  Confusion beyond normal. Possible urinary tract infection. EXAM: PORTABLE CHEST 1 VIEW COMPARISON:  03/13/2018 FINDINGS: Stable heart size and mediastinal contours allowing for patient rotation. Nonaneurysmal thoracic aorta. Interval improvement and partial clearing of left-sided lower  lobe pneumonia since prior. Small retrocardiac infiltrate or atelectasis medially within the right lung base is suggested on current exam. No acute osseous abnormality. IMPRESSION: 1. Interval improvement in left lower lobe pneumonia. 2. Small retrocardiac infiltrate or atelectasis medially within the right lung base is suggested on current exam. Electronically Signed   By: Tollie Eth M.D.   On: 04/08/2018 16:59   Dg Swallowing Func-speech Pathology  Result Date: 03/17/2018 Objective Swallowing Evaluation: Type of Study: MBS-Modified Barium Swallow Study  Patient Details Name: ELYJAH HAZAN MRN: [id number] Date of Birth: 1946/02/27 Today's Date: 03/17/2018 Time: SLP Start Time (ACUTE ONLY): 1145 -SLP Stop Time (ACUTE ONLY): 1214 SLP Time Calculation (min) (ACUTE ONLY): 29 min  Past Medical History: Past Medical History: Diagnosis Date . ANKLE SPRAIN 12/16/2006  Qualifier: Diagnosis of  By: Tawanna Cooler MD, Eugenio Hoes  . ANXIETY 10/25/2006 . CELLULITIS, LEG, RIGHT 09/08/2007  Qualifier: Diagnosis of  By: Tawanna Cooler MD, Eugenio Hoes  . Chronic pain disorder 11/30/2015 . COPD 10/25/2006 . Diabetes mellitus type 2 with neurological manifestations (HCC) 10/25/2006  Qualifier: Diagnosis of  By: Rosette Reveal RN, Jorene Minors  . DIABETES MELLITUS, TYPE I 10/25/2006 . Diabetic polyneuropathy (HCC) 04/08/2007  Qualifier: Diagnosis of  By: Tawanna Cooler MD, Eugenio Hoes  . 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 Essential hypertension 10/25/2006  Qualifier: Diagnosis of  By: Rosette Reveal RN, Jorene Minors  . Generalized osteoarthritis of multiple sites 11/30/2015 . HYPERTENSION 10/25/2006 . 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 Low back pain with radiation 01/23/2016 . Polyneuropathy in diabetes(357.2) 04/08/2007 . Psoriasis  . TOBACCO ABUSE 07/08/2007 . VENOUS INSUFFICIENCY 11/25/2007 Past Surgical History: Past Surgical History: Procedure Laterality Date . TESTICLE REMOVAL    R testicle HPI: 72 yo male adm to [facility] with HCAP.  PMH + for HTN, DM, chronic back pain - progressive encephalopathy over the last few weeks - wife ?s mild dementia per MD note.  Pt found to have increased troponins also and cardiology consult conducted.  Pt CT head negative.  CXR showed LLL posterior density = ? septic emboli vs multifocal pna.  ? oral thrush pt being treated= also as h/o hypertrophic tongue pallilae.  Swallow evaluation ordered.   Subjective: pt awake in chair Assessment / Plan / Recommendation CHL IP CLINICAL IMPRESSIONS 03/14/2018 Clinical Impression Pt presents with moderate oropharyngeal dysphagia with sensorimotor deficits.  Pt did have audible aspiration of thin and nectar with sequential swallows due to decreased adequacy of airway closure/protection.  Aspirates were too deep to allow clearance.  Pt also with  residuals in pharynx that he does NOT sense nor clears with "hock" or liquid swallows.  Dry swallows were not able to be performed by pt = ? due to motor planning and/or cognition.  Head turn right did not decrease pharyngeal residuals that were worse with increased viscocity.  Chin tuck worsened airway protection as it spilled barium into larynx from pyriform sinus.  Also cues of counting "1,2,3" did not help pt to initiate a swallow or orally transit more efficiently.   Suspect pt has some level of chronic dysphagia/aspiration.  Mitigation strategies reviewed with wife and pt using images from study during and immediately post-test.  Recommend follow up at SNF for RMST to help strengthen swallow musculature.   SLP Visit Diagnosis Dysphagia, oropharyngeal phase (R13.12) Attention and concentration deficit following -- Frontal lobe and executive function deficit following -- Impact on safety and function Moderate aspiration risk   CHL IP TREATMENT  RECOMMENDATION 03/14/2018 Treatment Recommendations Therapy as outlined in treatment plan below   Prognosis 03/17/2018 Prognosis for Safe Diet Advancement Fair Barriers to Reach Goals Time post onset Barriers/Prognosis Comment -- CHL IP DIET RECOMMENDATION 03/14/2018 SLP Diet Recommendations Dysphagia 2 (Fine chop) solids;Nectar thick liquid;Other (Comment) Liquid Administration via Cup;No straw;Spoon Medication Administration Whole meds with puree Compensations Minimize environmental distractions;Slow rate;Small sips/bites;Other (Comment);Effortful swallow;Follow solids with liquid Postural Changes Remain semi-upright after after feeds/meals (Comment);Seated upright at 90 degrees   CHL IP OTHER RECOMMENDATIONS 03/14/2018 Recommended Consults -- Oral Care Recommendations Oral care BID Other Recommendations Order thickener from pharmacy   CHL IP FOLLOW UP RECOMMENDATIONS 03/14/2018 Follow up Recommendations Skilled Nursing facility   [facility] IP FREQUENCY AND DURATION 03/14/2018  Speech Therapy Frequency (ACUTE ONLY) min 2x/week Treatment Duration 1 week      CHL IP ORAL PHASE 03/17/2018 Oral Phase Impaired Oral - Pudding Teaspoon -- Oral - Pudding Cup -- Oral - Honey Teaspoon -- Oral - Honey Cup -- Oral - Nectar Teaspoon Decreased bolus cohesion;Piecemeal swallowing;Weak lingual manipulation;Reduced posterior propulsion Oral - Nectar Cup Decreased bolus cohesion;Premature spillage;Piecemeal swallowing;Weak lingual manipulation;Lingual pumping Oral - Nectar Straw Decreased bolus cohesion;Weak lingual manipulation;Premature spillage;Piecemeal swallowing Oral - Thin Teaspoon Decreased bolus cohesion;Premature spillage;Lingual pumping;Weak lingual manipulation;Piecemeal swallowing Oral - Thin Cup Decreased bolus cohesion;Lingual pumping;Premature spillage;Weak lingual manipulation;Piecemeal swallowing Oral - Thin Straw Decreased bolus cohesion;Weak lingual manipulation;Lingual pumping;Premature spillage;Piecemeal swallowing Oral - Puree Decreased bolus cohesion;Weak lingual manipulation;Lingual pumping Oral - Mech Soft Decreased bolus cohesion;Impaired mastication;Weak lingual manipulation;Lingual pumping Oral - Regular -- Oral - Multi-Consistency -- Oral - Pill -- Oral Phase - Comment counting 1,2,3 did not help pt to initiate a swallow or orally transit more efficiently   CHL IP PHARYNGEAL PHASE 03/17/2018 Pharyngeal Phase Impaired Pharyngeal- Pudding Teaspoon -- Pharyngeal -- Pharyngeal- Pudding Cup -- Pharyngeal -- Pharyngeal- Honey Teaspoon -- Pharyngeal -- Pharyngeal- Honey Cup -- Pharyngeal -- Pharyngeal- Nectar Teaspoon Reduced pharyngeal peristalsis;Reduced epiglottic inversion;Reduced laryngeal elevation;Reduced airway/laryngeal closure;Pharyngeal residue - valleculae;Pharyngeal residue - pyriform Pharyngeal -- Pharyngeal- Nectar Cup Reduced laryngeal elevation;Reduced airway/laryngeal closure;Reduced pharyngeal peristalsis;Reduced tongue base retraction;Penetration/Aspiration during  swallow;Pharyngeal residue - valleculae;Pharyngeal residue - pyriform Pharyngeal Material enters airway, remains ABOVE vocal cords and not ejected out Pharyngeal- Nectar Straw Reduced epiglottic inversion;Reduced anterior laryngeal mobility;Reduced laryngeal elevation;Reduced airway/laryngeal closure;Reduced tongue base retraction;Pharyngeal residue - valleculae;Pharyngeal residue - pyriform;Penetration/Apiration after swallow Pharyngeal Material enters airway, passes BELOW cords and not ejected out despite cough attempt by patient Pharyngeal- Thin Teaspoon Reduced tongue base retraction;Reduced airway/laryngeal closure;Reduced pharyngeal peristalsis;Reduced laryngeal elevation;Pharyngeal residue - valleculae;Pharyngeal residue - pyriform Pharyngeal -- Pharyngeal- Thin Cup Reduced laryngeal elevation;Reduced pharyngeal peristalsis;Reduced tongue base retraction;Reduced epiglottic inversion;Penetration/Aspiration during swallow;Reduced airway/laryngeal closure;Pharyngeal residue - valleculae;Pharyngeal residue - pyriform Pharyngeal Material enters airway, CONTACTS cords and not ejected out Pharyngeal- Thin Straw -- Pharyngeal -- Pharyngeal- Puree Reduced pharyngeal peristalsis;Reduced epiglottic inversion;Reduced laryngeal elevation;Reduced airway/laryngeal closure;Reduced tongue base retraction;Pharyngeal residue - valleculae Pharyngeal -- Pharyngeal- Mechanical Soft Pharyngeal residue - valleculae;Pharyngeal residue - pyriform;Reduced pharyngeal peristalsis;Reduced tongue base retraction;Reduced laryngeal elevation;Reduced airway/laryngeal closure;Reduced epiglottic inversion Pharyngeal -- Pharyngeal- Regular -- Pharyngeal -- Pharyngeal- Multi-consistency -- Pharyngeal -- Pharyngeal- Pill -- Pharyngeal -- Pharyngeal Comment head turn right did not decrease residuals, chin tuck worsened airway protection as it spilled barium into larynx   CHL IP CERVICAL ESOPHAGEAL PHASE 03/17/2018 Cervical Esophageal Phase WFL  Pudding Teaspoon -- Pudding Cup -- Honey Teaspoon -- Honey Cup -- Nectar Teaspoon -- Nectar Cup -- Nectar Straw -- Thin Teaspoon -- Thin Cup -- Thin Straw --  Puree -- Mechanical Soft -- Regular -- Multi-consistency -- Pill -- Cervical Esophageal Comment upon esophageal sweep, pt appeared with mild backflow of nectar, ? dysmotility Donavan Burnet, MS [facility] SLP Acute Rehab Services Pager [phone] Office [phone] Chales Abrahams 03/17/2018, 1:27 PM               Microbiology: Recent Results (from the past 240 hour(s))  Urine culture     Status: Abnormal   Collection Time: 04/08/18  6:02 PM  Result Value Ref Range Status   Specimen Description   Final    URINE, CATHETERIZED Performed at [facility], [address] [place], [place] [address]    Special Requests   Final    NONE Performed at [facility], [address], [place], [place] [address]    Culture (A)  Final    <10,000 COLONIES/mL INSIGNIFICANT GROWTH Performed at [facility], [address], [place], [place] [address]    Report Status 04/10/2018 FINAL  Final  Culture, blood (routine x 2)     Status: None   Collection Time: 04/08/18 11:19 PM  Result Value Ref Range Status   Specimen Description   Final    BLOOD RIGHT HAND Performed at [facility], [address] [place], [place] [address]    Special Requests   Final    BOTTLES DRAWN AEROBIC AND ANAEROBIC Blood Culture adequate volume Performed at [facility], [address], [place], [place] [address]    Culture   Final    NO GROWTH 5 DAYS Performed at [facility], [address], [place], [place] [address]    Report Status 04/14/2018 FINAL  Final  Culture, blood (routine x 2)     Status: None   Collection Time: 04/08/18 11:19 PM  Result Value Ref Range Status   Specimen Description   Final    BLOOD RIGHT FOREARM Performed at [facility], [address], [place],  [place] [address]    Special Requests   Final    AEROBIC BOTTLE ONLY Blood Culture adequate volume Performed at [facility], [address] [place], [place] [address]    Culture   Final    NO GROWTH 5 DAYS Performed at [facility], [address], [place], [place] [address]    Report Status 04/14/2018 FINAL  Final     Labs: Basic Metabolic Panel: Recent Labs  Lab 04/11/18 0537 04/12/18 0512 04/13/18 0518 04/14/18 0531 04/15/18 0547  NA 138 138 141 138 135  K 3.9 3.7 3.2* 3.7 3.6  CL 102 100 102 99 99  CO2 26 28 28 28 28   GLUCOSE 294* 310* 174* 285* 275*  BUN 30* 28* 29* 33* 34*  CREATININE 1.48* 1.48* 1.33* 1.30* 1.34*  CALCIUM 8.4* 8.7* 8.8* 8.6* 8.5*   Liver Function Tests: No results for input(s): AST, ALT, ALKPHOS, BILITOT, PROT, ALBUMIN in the last 168 hours. No results for input(s): LIPASE, AMYLASE in the last 168 hours. Recent Labs  Lab 04/08/18 1604  AMMONIA 15   CBC: Recent Labs  Lab 04/11/18 0537 04/12/18 0512 04/13/18 0518 04/14/18 0531 04/15/18 0547  WBC 12.2* 11.8* 13.7* 13.7* 12.5*  NEUTROABS 8.8* 8.0* 9.6* 8.9* 8.4*  HGB 10.0* 9.9* 9.9* 9.3* 8.7*  HCT 31.2* 31.4* 31.7* 29.6* 27.0*  MCV 90.7 94.0 92.7 91.9 90.6  PLT 249 238 280 273 320   Cardiac Enzymes: Recent Labs  Lab 04/08/18  1604  CKTOTAL 48*   BNP: BNP (last 3 results) No results for input(s): BNP in the last 8760 hours.  ProBNP (last 3 results) No results for input(s): PROBNP in the last 8760 hours.  CBG: Recent Labs  Lab 04/14/18 1232 04/14/18 1617 04/14/18 2113 04/15/18 0710 04/15/18 1127  GLUCAP 184* 217* 270* 267* 293*       Signed:  Briant Cedar, MD [facility] 04/15/2018, 12:50 PM

## 2018-04-15 NOTE — Progress Notes (Signed)
CSW received patients insurance authorization for SN[facility]- authorization number is [id number] and is good for 7 days. CSW will notify MD.   Stacy GardnerErin Thadius Smisek, LCSW Clinical Social Worker  [employer]  [phone]

## 2018-04-15 NOTE — Consult Note (Signed)
   [facility]   04/15/2018  Jeremy Gill 1946-05-23 [id number]    [facility].  Chart reviewed. Noted Jeremy Gill will [facility]/18. [facility] Coordinator aware.  [facility] remains on file.  Landis Martinsatricia Emile (wife) endorses that she should be contacted for post discharge calls at [phone].   Raiford NobleAtika , MSN-Ed, RN,BSN [facility] Care Management Hospital Liaison [phone]

## 2018-04-15 NOTE — Consult Note (Addendum)
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  I met with both Jeremy Gill and his wife prior to discharge to a skilled nursing facility.  He was admitted with altered mental status and urinary retention.  He also has multiple wounds including a diabetic foot ulcer and a stage II sacral decubitus, chronic.  His wife reports that his mobility is limited by the neuropathy in his lower extremities and peripheral vascular disease.  Goals of Care: 1. Short Term Rehab then home with hospice care- his wife knows that he is declining and wants his pain controlled as first priority in hopes that it will help him rehab better- this will make it easier for her to care for him at home.   2. We completed a MOST form indicating DNR, limited interventions and try to avoid rehospitalization unless necessary for treatment of a clearly reversible condition.  3. Quality of life is important to both patient and his wife- she is processing that he is declining.  Symptom Management  Chronic Neuropathic Pain: He does not have a history of substance use disorder, not had neck dosing of opioids in the past, he has however been placed on many non-opioid drugs for neuropathy that caused sedation and multiple side effects and because of his stage III chronic kidney disease he is not a candidate for NSAIDs.  Do not believe his altered mental status was soley related to medications, likely related to his urinary tract infection and urinary retention.  Now that his infection has been treated his mental status is beginning to clear however  he does not have capacity for decision-making and demonstrated anxiety while discussing treatment plans and his goals of care.  I am recommending using low dose opioids to control his pain- hydrocodone 5/325 TID and at bedtime as needed for pain.   His wife is agreeable to this plan.  Will request palliative see in SNF as soon as possible after discharge.  Lane Hacker, DO Palliative Medicine  50 min Greater than 50%  of this time was spent counseling and coordinating care related to the above assessment and plan.

## 2018-04-16 ENCOUNTER — Other Ambulatory Visit: Payer: Self-pay

## 2018-04-16 ENCOUNTER — Encounter: Payer: Self-pay | Admitting: Internal Medicine

## 2018-04-16 ENCOUNTER — Non-Acute Institutional Stay (SKILLED_NURSING_FACILITY): Payer: PPO | Admitting: Internal Medicine

## 2018-04-16 DIAGNOSIS — E785 Hyperlipidemia, unspecified: Secondary | ICD-10-CM

## 2018-04-16 DIAGNOSIS — E1149 Type 2 diabetes mellitus with other diabetic neurological complication: Secondary | ICD-10-CM

## 2018-04-16 DIAGNOSIS — L89612 Pressure ulcer of right heel, stage 2: Secondary | ICD-10-CM | POA: Diagnosis not present

## 2018-04-16 DIAGNOSIS — N179 Acute kidney failure, unspecified: Secondary | ICD-10-CM | POA: Diagnosis not present

## 2018-04-16 DIAGNOSIS — E1159 Type 2 diabetes mellitus with other circulatory complications: Secondary | ICD-10-CM

## 2018-04-16 DIAGNOSIS — G9341 Metabolic encephalopathy: Secondary | ICD-10-CM

## 2018-04-16 DIAGNOSIS — L89621 Pressure ulcer of left heel, stage 1: Secondary | ICD-10-CM | POA: Diagnosis not present

## 2018-04-16 DIAGNOSIS — N390 Urinary tract infection, site not specified: Secondary | ICD-10-CM

## 2018-04-16 DIAGNOSIS — N401 Enlarged prostate with lower urinary tract symptoms: Secondary | ICD-10-CM

## 2018-04-16 DIAGNOSIS — J189 Pneumonia, unspecified organism: Secondary | ICD-10-CM | POA: Diagnosis not present

## 2018-04-16 DIAGNOSIS — I10 Essential (primary) hypertension: Secondary | ICD-10-CM

## 2018-04-16 DIAGNOSIS — E1169 Type 2 diabetes mellitus with other specified complication: Secondary | ICD-10-CM | POA: Diagnosis not present

## 2018-04-16 DIAGNOSIS — N138 Other obstructive and reflux uropathy: Secondary | ICD-10-CM

## 2018-04-16 DIAGNOSIS — L89150 Pressure ulcer of sacral region, unstageable: Secondary | ICD-10-CM

## 2018-04-16 DIAGNOSIS — N183 Chronic kidney disease, stage 3 (moderate): Secondary | ICD-10-CM

## 2018-04-16 NOTE — Progress Notes (Signed)
:   Location:  [facility] Room Number: [phone] Place of Service:  SNF (31)  Jeremy Gill. [name], MD  Patient Care Team: [place], [name], MD as PCP - General [facility])  Extended Emergency Contact Information Primary Emergency Contact: [facility] Address: [address]          [place] [address] [place] Home Phone: [phone] Mobile Phone: [phone] Relation: Spouse     Allergies: Patient has no known allergies.  Chief Complaint  Patient presents with  . Readmit To SNF    Admit to [facility]    HPI: Patient is 72 y.o. male diabetes mellitus type 2 insulin-dependent, A1c of 9.1 on 12/2017, hypertension, BPH with recent urinary retention requiring Foley, chronic low back pain, who presented from his home with confusion and decreased responsiveness.  Significant was recent hospital stay from October 17 through October 22 where patient's acute problem included pneumonia, and STEMI, urinary retention, acute on chronic anemia requiring transfusion,.  History was obtained via discussion with patient and spouse secondary to patient's confusion.  Prior to his recent hospitalization patient was walking with four-wheel walker needing some help with his ADLs, patient 2 months later lethargic and slid out of his walker onto the ground.  In the ED patient was found to have a leukocytosis of 17.6 and creatinine of 1.5 urinalysis with large leukocyte esterase and chest x-ray without obvious focal infiltrate patient was admitted to [facility] long hospital from 11/12-19 where he was treated for acute metabolic encephalopathy secondary to sepsis from unstageable ulcer versus H CAP versus UTI versus polypharmacy.  Patient was started on IV vancomycin and cefepime then switched to p.o. Augmentin for total 7 days of antibiotic last dose on 11/21.  Work-up was largely negative with fluctuating leukocytosis, normal lactic acid, pro calcitonin from  0.52 down to 0.36, UA with large leukocytes but urine culture negative blood culture negative chest x-ray showing improvement of left lower lobe pneumonia versus atelectasis, CT abdomen pelvis with bilateral stones without obstruction, nonspecific pulmonary nodules with CT 36 months follow-up recommended, CT head negative for acute process MRI of the brain negative for acute process hospital course was also complicated by acute kidney injury with creatinine on admission 1.5, as well as acute lower urinary retention secondary to BPH requiring a Foley and multiple ulcers including unstageable ulcer on the sacrum stage I left heel and stage II on right heel.  Patient is admitted to skilled nursing facility for OT/PT.  While at skilled nursing facility patient will be followed for hypertension treated with Norvasc, Imdur, Lopressor, hyperlipidemia treated with Lipitor and diabetes treated with insulin.  Past Medical History:  Diagnosis Date  . ANKLE SPRAIN 12/16/2006   Qualifier: Diagnosis of  By: Tawanna Cooler MD, Eugenio Hoes   . ANXIETY 10/25/2006  . CELLULITIS, LEG, RIGHT 09/08/2007   Qualifier: Diagnosis of  By: Tawanna Cooler MD, Eugenio Hoes   . Chronic pain disorder 11/30/2015  . COPD 10/25/2006  . 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 DIABETES MELLITUS, TYPE I 10/25/2006  . Diabetic polyneuropathy (HCC) 04/08/2007   Qualifier: Diagnosis of  By: Tawanna Cooler MD, Eugenio Hoes   . 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 Essential hypertension 10/25/2006   Qualifier: Diagnosis of  By: Rosette Reveal RN, Jorene Minors   . Generalized osteoarthritis of multiple sites 11/30/2015  . HYPERTENSION 10/25/2006  . Hypertrophy of tongue  papillae 11/11/2007   Qualifier: Diagnosis of  By: Tawanna Cooler MD, Eugenio Hoes   . Low back pain with radiation 01/23/2016  . Polyneuropathy in diabetes(357.2) 04/08/2007  . Psoriasis   . TOBACCO ABUSE 07/08/2007  . VENOUS INSUFFICIENCY 11/25/2007      Past Surgical History:  Procedure Laterality Date  . TESTICLE REMOVAL     R testicle    Allergies as of 04/16/2018   No Known Allergies     Medication List        Accurate as of 04/16/18  9:39 AM. Always use your most recent med list.          ACCU-CHEK SOFTCLIX LANCETS lancets Use as instructed   amLODipine 5 MG tablet Commonly known as:  NORVASC Take 1 tablet (5 mg total) by mouth daily.   amoxicillin-clavulanate 875-125 MG tablet Commonly known as:  AUGMENTIN Take 1 tablet by mouth 2 (two) times daily for 2 days.   aspirin EC 81 MG tablet Take 1 tablet (81 mg total) by mouth daily.   atorvastatin 40 MG tablet Commonly known as:  LIPITOR Take 1 tablet (40 mg total) by mouth daily.   baclofen 10 MG tablet Commonly known as:  LIORESAL Take 0.5 tablets (5 mg total) by mouth 3 (three) times daily as needed for muscle spasms.   budesonide-formoterol 160-4.5 MCG/ACT inhaler Commonly known as:  SYMBICORT Inhale 2 puffs into the lungs 2 (two) times daily.   DULoxetine 60 MG capsule Commonly known as:  CYMBALTA TAKE 1 CAPSULE BY MOUTH EVERY DAY   folic acid 1 MG tablet Commonly known as:  FOLVITE Take 1 tablet (1 mg total) by mouth daily.   glucose blood test strip Use to test blood sugar three-four times daily.   HYDROcodone-acetaminophen 5-325 MG tablet Commonly known as:  NORCO/VICODIN Take 1 tablet by mouth 4 (four) times daily -  with meals and at bedtime.   insulin aspart 100 UNIT/ML injection Commonly known as:  novoLOG Inject 6 Units into the skin 3 (three) times daily with meals.   Insulin Glargine 100 UNIT/ML Solostar Pen Commonly known as:  LANTUS Inject 28 Units into the skin at bedtime.   INSULIN SYRINGE .5CC/31GX5/16" 31G X 5/16" 0.5 ML Misc USE AS DIRECTED WITH LANTUS   ipratropium-albuterol 0.5-2.5 (3) MG/3ML Soln Commonly known as:  DUONEB Take 3 mLs by nebulization every 6 (six) hours as needed.   isosorbide mononitrate 30 MG  24 hr tablet Commonly known as:  IMDUR Take 1 tablet (30 mg total) by mouth daily.   metoprolol tartrate 50 MG tablet Commonly known as:  LOPRESSOR Take 1 tablet (50 mg total) by mouth 2 (two) times daily.   NONFORMULARY OR COMPOUNDED ITEM Apply 1 application topically 2 (two) times daily as needed (rash). 1:1 mixture of eucerin and triamcinolone 0.1% ointment   polyethylene glycol packet Commonly known as:  MIRALAX / GLYCOLAX Take 17 g by mouth daily.   tamsulosin 0.4 MG Caps capsule Commonly known as:  FLOMAX Take 1 capsule (0.4 mg total) by mouth daily after breakfast. NEED OV.   traMADol 50 MG tablet Commonly known as:  ULTRAM Take 0.5 tablets (25 mg total) by mouth every 8 (eight) hours as needed for up to 20 days for moderate pain.   vitamin B-12 1000 MCG tablet Commonly known as:  CYANOCOBALAMIN Take 1,000 mcg by mouth daily.       No orders of the defined types were placed in this encounter.   Immunization History  Administered Date(s) Administered  . Influenza Split 04/26/2011, 03/12/2012  . Influenza Whole 05/28/2004, 04/03/2007, 03/10/2008, 02/23/2009, 04/05/2010  . Influenza, High Dose Seasonal PF 03/29/2015, 04/16/2017  . Influenza,inj,Quad PF,6+ Mos 03/24/2013, 01/19/2014  . Pneumococcal Conjugate-13 01/19/2014  . Pneumococcal Polysaccharide-23 05/28/2001, 04/08/2007, 02/08/2016  . Td 05/28/2001  . Tdap 08/07/2011  . Zoster 12/12/2012    Social History   Tobacco Use  . Smoking status: Current Every Day Smoker    Types: Pipe    Last attempt to quit: 05/29/2011    Years since quitting: 6.8  . Smokeless tobacco: Never Used  Substance Use Topics  . Alcohol use: No    Family history is   Family History  Problem Relation Age of Onset  . Cancer Mother        Unknown   . Diabetes Sister       Review of Systems  DATA OBTAINED: from patient, nurse GENERAL:  no fevers, fatigue, appetite changes SKIN: No itching, or rash EYES: No eye pain,  redness, discharge EARS: No earache, tinnitus, change in hearing NOSE: No congestion, drainage or bleeding  MOUTH/THROAT: No mouth or tooth pain, No sore throat RESPIRATORY: No cough, wheezing, SOB CARDIAC: No chest pain, palpitations, lower extremity edema  GI: No abdominal pain, No N/V/D or constipation, No heartburn or reflux  GU: No dysuria, frequency or urgency, or incontinence  MUSCULOSKELETAL: No unrelieved bone/joint pain NEUROLOGIC: No headache, dizziness or focal weakness PSYCHIATRIC: No c/o anxiety or sadness   Vitals:   04/16/18 0934  BP: 131/77  Pulse: 82  Resp: 17  Temp: (!) 97.1 F (36.2 C)    SpO2 Readings from Last 1 Encounters:  04/15/18 97%   Body mass index is 22.1 kg/m.     Physical Exam  GENERAL APPEARANCE: Alert, conversant,  No acute distress.  SKIN: No diaphoresis rash HEAD: Normocephalic, atraumatic  EYES: Conjunctiva/lids clear. Pupils round, reactive. EOMs intact.  EARS: External exam WNL, canals clear. Hearing grossly normal.  NOSE: No deformity or discharge.  MOUTH/THROAT: Lips w/o lesions  RESPIRATORY: Breathing is even, unlabored. Lung sounds are clear   CARDIOVASCULAR: Heart RRR no murmurs, rubs or gallops. No peripheral edema.   GASTROINTESTINAL: Abdomen is soft, non-tender, not distended w/ normal bowel sounds. GENITOURINARY: Bladder non tender, not distended  MUSCULOSKELETAL: No abnormal joints or musculature NEUROLOGIC:  Cranial nerves 2-12 grossly intact. Moves all extremities  PSYCHIATRIC: Mood and affect appropriate to situation, no behavioral issues  Patient Active Problem List   Diagnosis Date Noted  . Palliative care patient 04/15/2018  . UTI (urinary tract infection) 04/09/2018  . Community acquired pneumonia 04/03/2018  . NSTEMI (non-ST elevated myocardial infarction) (HCC) 03/25/2018  . Multi-infarct dementia (HCC) 03/25/2018  . Hypomagnesemia 03/25/2018  . Folate deficiency 03/16/2018  . Elevated troponin 03/14/2018   . Abnormal EKG 03/14/2018  . Chest pain 03/14/2018  . Oral candidiasis 03/14/2018  . Dysphagia 03/14/2018  . Malnutrition of moderate degree 03/14/2018  . HCAP (healthcare-associated pneumonia) 03/13/2018  . Abnormality of gait 02/27/2018  . Bilateral lower extremity edema 02/05/2018  . Pruritus of skin 02/05/2018  . Protein-calorie malnutrition, severe 01/16/2018  . Acute urinary retention 01/13/2018  . Acute encephalopathy 01/13/2018  . Obstipation 01/13/2018  . Chronic pain syndrome 11/01/2017  . Pressure injury of skin 10/02/2017  . Anemia   . Depression 09/30/2017  . Acute renal failure superimposed on stage 3 chronic kidney disease (HCC) 09/30/2017  . Sepsis (HCC) 09/30/2017  . Hypokalemia 09/30/2017  . Hyperlipidemia associated with  type 2 diabetes mellitus (HCC) 02/05/2017  . AKI (acute kidney injury) (HCC) 12/19/2016  . Acute metabolic encephalopathy 12/19/2016  . DKA (diabetic ketoacidoses) (HCC) 12/18/2016  . Hyperkalemia 12/18/2016  . Anxiety disorder, unspecified 02/08/2016  . Low back pain with radiation 01/23/2016  . Chronic pain disorder 11/30/2015  . Generalized osteoarthritis of multiple sites 11/30/2015  . DYSHIDROSIS 06/08/2008  . VENOUS INSUFFICIENCY 11/25/2007  . 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 TOBACCO ABUSE 07/08/2007  . Diabetic polyneuropathy (HCC) 04/08/2007  . 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 Hypertension associated with diabetes (HCC) 10/25/2006  . COPD (chronic obstructive pulmonary disease) (HCC) 10/25/2006      Labs reviewed: Basic Metabolic Panel:    Component Value Date/Time   NA 135 04/15/2018 0547   NA 139 03/26/2018   K 3.6 04/15/2018 0547   CL 99 04/15/2018 0547   CO2 28 04/15/2018 0547   GLUCOSE 275 (H) 04/15/2018 0547   GLUCOSE 79 03/05/2006 1042   BUN 34 (H) 04/15/2018 0547   BUN 15 03/26/2018   CREATININE 1.34 (H) 04/15/2018 0547   CALCIUM 8.5 (L) 04/15/2018 0547    PROT 5.9 (L) 03/14/2018 0334   PROT 7.1 02/26/2017 1630   ALBUMIN 2.3 (L) 03/14/2018 0334   ALBUMIN 4.4 02/26/2017 1630   AST 19 03/14/2018 0334   ALT 14 03/14/2018 0334   ALKPHOS 36 (L) 03/14/2018 0334   BILITOT 0.9 03/14/2018 0334   BILITOT 0.4 02/26/2017 1630   GFRNONAA 51 (L) 04/15/2018 0547   GFRAA 59 (L) 04/15/2018 0547    Recent Labs    10/03/17 0516 10/04/17 0511  01/14/18 0328  03/16/18 0103 03/17/18 0450  03/18/18 0619  04/13/18 0518 04/14/18 0531 04/15/18 0547  NA 139 141   < > 146*   < > 139 141  --  140   < > 141 138 135  K 3.4* 3.5   < > 4.5   < > 3.4* 3.0*   < > 3.3*   < > 3.2* 3.7 3.6  CL 101 106   < > 108   < > 109 108  --  107   < > 102 99 99  CO2 21* 24   < > 30   < > 22 24  --  27   < > 28 28 28   GLUCOSE 258* 168*   < > 74   < > 443* 328*  --  329*   < > 174* 285* 275*  BUN 15 13   < > 27*   < > 26* 26*  --  26*   < > 29* 33* 34*  CREATININE 0.94 0.83   < > 1.21   < > 1.18 1.07  --  0.99   < > 1.33* 1.30* 1.34*  CALCIUM 9.0 8.6*   < > 9.2   < > 7.9* 8.1*  --  7.9*   < > 8.8* 8.6* 8.5*  MG 1.1* 1.6*  --  1.7   < > 1.8 1.7  --  1.9  --   --   --   --   PHOS 3.6 3.3  --  4.7*  --   --   --   --   --   --   --   --   --    < > = values in this interval not displayed.   Liver Function Tests: Recent Labs    01/14/18 0328 03/13/18 1317 03/14/18  0334  AST 21 25 19   ALT 13 16 14   ALKPHOS 35* 43 36*  BILITOT 0.8 1.2 0.9  PROT 6.7 6.7 5.9*  ALBUMIN 3.2* 2.8* 2.3*   No results for input(s): LIPASE, AMYLASE in the last 8760 hours. Recent Labs    09/30/17 2101 10/15/17 0953 04/08/18 1604  AMMONIA 12 <9* 15   CBC: Recent Labs    04/13/18 0518 04/14/18 0531 04/15/18 0547  WBC 13.7* 13.7* 12.5*  NEUTROABS 9.6* 8.9* 8.4*  HGB 9.9* 9.3* 8.7*  HCT 31.7* 29.6* 27.0*  MCV 92.7 91.9 90.6  PLT 280 273 320   Lipid Recent Labs    03/14/18 0928  CHOL 67  HDL 29*  LDLCALC 28  TRIG 49    Cardiac Enzymes: Recent Labs    03/13/18 1317   03/14/18 0928 03/14/18 1552 03/15/18 0827 04/08/18 1604  CKTOTAL 94  --   --   --   --  48*  TROPONINI  --    < > 1.23* 1.11* 0.81*  --    < > = values in this interval not displayed.   BNP: No results for input(s): BNP in the last 8760 hours. Lab Results  Component Value Date   MICROALBUR <0.7 04/16/2017   Lab Results  Component Value Date   HGBA1C 8.8 (H) 04/12/2018   Lab Results  Component Value Date   TSH 0.760 01/14/2018   Lab Results  Component Value Date   VITAMINB12 (NOTE) 03/16/2018   Lab Results  Component Value Date   FOLATE 3.7 (L) 03/16/2018   Lab Results  Component Value Date   IRON 21 (L) 03/16/2018   TIBC 132 (L) 03/16/2018   FERRITIN 169 03/16/2018    Imaging and Procedures obtained prior to SNF admission: Ct Abdomen Pelvis Wo Contrast  Result Date: 04/10/2018 CLINICAL DATA:  72 year old with flank pain and recurrent stone disease. EXAM: CT ABDOMEN AND PELVIS WITHOUT CONTRAST TECHNIQUE: Multidetector CT imaging of the abdomen and pelvis was performed following the standard protocol without IV contrast. COMPARISON:  CT 01/13/2018 FINDINGS: Lower chest: Extensive coronary artery calcifications. Small bilateral pleural effusions. 6 mm nodule in the right lower lobe on sequence 6, image 8. Patchy airspace densities in both lower lobes and a small amount of disease in the right middle lobe. Small peripheral nodular densities in left lower lobe on sequence 6, image 9. Hepatobiliary: New trace perihepatic ascites. Small low-density structure in the anterior liver on sequence 2, image 32 is nonspecific but could represent a small cyst. No significant gallbladder distension. Pancreas: Unremarkable. No pancreatic ductal dilatation or surrounding inflammatory changes. Spleen: Normal in size without focal abnormality. Adrenals/Urinary Tract: Adrenal glands are within normal limits. Least 2 punctate nonobstructive right kidney stones. Approximately 4 punctate left kidney  stones without hydronephrosis. No evidence for ureter dilatation. No definite ureter stones. The urinary bladder is moderately distended with wall thickening measuring roughly 6 mm. There is a Foley catheter within the urinary bladder. There may be calcifications along the posterior wall of the urinary bladder on sequence 2, image 69. Kidneys are mildly lobular and similar to the prior examination. Stomach/Bowel: Stomach is unremarkable. There is no significant bowel dilatation and no evidence for obstruction. Limited evaluation for bowel inflammation on this noncontrast examination. Study also has technical limitations due to streak artifact from the patient's arms and mild motion artifact. Vascular/Lymphatic: Large amount of calcium at the origin of the SMA. Large amount of calcium at the origin of the right renal artery.  Atherosclerotic calcifications in the abdominal aorta without aneurysm. Calcifications in the bilateral femoral arteries. No significant lymph node enlargement in the abdomen or pelvis. Reproductive: Prostate calcifications. Other: Diffuse subcutaneous edema. Evidence for mesenteric edema. A small amount of ascites in the pelvis and around the liver. Musculoskeletal: Mild disc space narrowing at L5-S1. No suspicious bone findings. IMPRESSION: 1. Small bilateral kidney stones without obstruction. 2. Distended urinary bladder despite having a Foley catheter. Bladder wall thickening is nonspecific but could be related to chronic bladder outlet obstruction or inflammation. 3. Subcutaneous edema with a small amount of ascites. Findings could be related to fluid overload or anasarca. 4. Small bilateral pleural effusions with patchy airspace and nodular disease at the lung bases. Findings are concerning for pneumonia or aspiration. 5. **An incidental finding of potential clinical significance has been found. Nonspecific pulmonary nodules. Largest pulmonary nodule measures 6 mm in the right lower lobe.  Non-contrast chest CT at 3-6 months is recommended. If the nodules are stable at time of repeat CT, then future CT at 18-24 months (from today's scan) is considered optional for low-risk patients, but is recommended for high-risk patients. This recommendation follows the consensus statement: Guidelines for Management of Incidental Pulmonary Nodules Detected on CT Images: From the Fleischner Society 2017; Radiology 2017; 284:228-243.** 6. Aortic Atherosclerosis (ICD10-I70.0). Coronary artery calcifications. Electronically Signed   By: Richarda Overlie M.D.   On: 04/10/2018 09:35   Ct Head Wo Contrast  Result Date: 04/09/2018 CLINICAL DATA:  Altered level of consciousness. EXAM: CT HEAD WITHOUT CONTRAST TECHNIQUE: Contiguous axial images were obtained from the base of the skull through the vertex without intravenous contrast. COMPARISON:  CT scan of March 13, 2018. FINDINGS: Brain: Mild diffuse cortical atrophy is noted. Mild chronic ischemic white matter disease is noted. No mass effect or midline shift is noted. Ventricular size is within normal limits. There is no evidence of mass lesion, hemorrhage or acute infarction. Vascular: No hyperdense vessel or unexpected calcification. Skull: Normal. Negative for fracture or focal lesion. Sinuses/Orbits: No acute finding. Other: None. IMPRESSION: Mild diffuse cortical atrophy. Mild chronic ischemic white matter disease. No acute intracranial abnormality seen. Electronically Signed   By: Lupita Raider, M.D.   On: 04/09/2018 13:11   Mr Laqueta Jean ZO Contrast  Result Date: 04/09/2018 CLINICAL DATA:  Altered mental status EXAM: MRI HEAD WITHOUT AND WITH CONTRAST TECHNIQUE: Multiplanar, multiecho pulse sequences of the brain and surrounding structures were obtained without and with intravenous contrast. CONTRAST:  7 mL Gadavist COMPARISON:  Head CT 04/09/2018 FINDINGS: BRAIN: There is no acute infarct, acute hemorrhage, hydrocephalus or extra-axial collection. The midline  structures are normal. No midline shift or other mass effect. 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 Diffuse confluent hyperintense T2-weighted signal within the periventricular, deep and juxtacortical white matter, most commonly due to chronic ischemic microangiopathy. Advanced atrophy for age. Susceptibility-sensitive sequences show no chronic microhemorrhage or superficial siderosis. VASCULAR: Major intracranial arterial and venous sinus flow voids are normal. SKULL AND UPPER CERVICAL SPINE: Calvarial bone marrow signal is normal. There is no skull base mass. Visualized upper cervical spine and soft tissues are normal. SINUSES/ORBITS: No fluid levels or advanced mucosal thickening. No mastoid or middle ear effusion. The orbits are normal. IMPRESSION: 1. No acute intracranial abnormality. 2. Multiple old small vessel infarcts and advanced findings of chronic ischemic microangiopathy. Electronically Signed   By: Deatra Robinson M.D.   On: 04/09/2018 19:18   Mr Sacrum Si Joints Wo Contrast  Result Date: 04/10/2018 CLINICAL DATA:  Sepsis.  Sacral ulcer EXAM: MRI SACRUM WITHOUT CONTRAST TECHNIQUE: Multiplanar multisequence MR imaging of the pelvis was performed. No intravenous contrast was administered. COMPARISON:  CT scan 04/10/2018 FINDINGS: Marrow heterogeneity is observed in general with areas of accentuated T2 signal in the iliac bones and sacrum likewise having accentuated T1 signal. Some of this may be from red marrow redistribution. Given the high T1 signal this would not be a characteristic appearance for infiltrative malignancy or osteomyelitis, both of which would tend to have low T1 signal. No bony destructive findings characteristic of osteomyelitis. There is mild thinning of the soft tissues overlying the lower sacrum with some bandaging in this vicinity possibly reflecting a wound. Again, no definite bony destructive findings. There is edema tracking in a generalized  fashion along fascia planes in the pelvis, including the gluteal musculature, hip adductor musculature, presacral region, and pelvic floor. This is ascribed to third spacing of fluid. Foley catheter is present in the urinary bladder. Trace pelvic ascites. IMPRESSION: 1. No current compelling findings of sacral or coccygeal osteomyelitis. There is potentially some thinning of the dorsal soft tissues overlying the lower sacrum. 2. Marrow heterogeneity is present. The most common causes include anemia, smoking, obesity, or advancing age. Third spacing of fluid along tissue planes along with a small amount of pelvic ascites. Electronically Signed   By: Gaylyn Rong M.D.   On: 04/10/2018 18:45   Nm Pulmonary Perf And Vent  Result Date: 04/10/2018 CLINICAL DATA:  Shortness of breath with elevated D-dimer EXAM: NUCLEAR MEDICINE VENTILATION - PERFUSION LUNG SCAN VIEWS: Anterior, posterior, left lateral, right lateral, RPO, LPO, RAO, LAO-ventilation and perfusion RADIOPHARMACEUTICALS:  29.14 mCi of Tc-4m DTPA aerosol inhalation and 4.3 mCi Tc27m MAA IV COMPARISON:  Chest radiograph April 08, 2018 FINDINGS: Ventilation: Radiotracer uptake is homogeneous and symmetric bilaterally. No ventilation defects are evident. Perfusion: Radiotracer uptake is homogeneous and symmetric bilaterally. No perfusion defects are evident. IMPRESSION: No appreciable ventilation or perfusion defects. Very low probability of pulmonary embolus. Electronically Signed   By: Bretta Bang III M.D.   On: 04/10/2018 10:17     Not all labs, radiology exams or other studies done during hospitalization come through on my EPIC note; however they are reviewed by me.    Assessment and Plan  Encephalopathy/UTI/HCAP- patient was treated with vancomycin and cefepime then switched to p.o. Augmentin for total of 7 days of antibiotics last dose 11/21; leukocytosis was fluctuating, lactic acid was normal, procalcitonin was 0.52-0.36, UA  with large leukocytes but negative culture blood culture negative chest x-ray showed improvement of left lower lobe pneumonia CT abdomen pelvis with kidney stones no obstruction and nonspecific pulmonary nodules follow-up CT 3 to 6 months, CT head negative MRI brain negative SNF- admitted for OT/PT; continue Augmentin 875 1 p.o. twice daily for 2 more days  Acute kidney injury- admission creatinine 1.5 SNF- follow-up BMP  BPH with urinary retention- successful voiding trial on 11/17 SNF- continue Flomax 0.4 mg daily  Hypertension  SNF-continue metoprolol 50 mg twice daily, and Imdur 30 mg daily; patient was started on Norvasc 5 mg daily  Diabetes mellitus type 2- patient had both hypoglycemia and hyperglycemia while hospitalized; last A1c 8.8 on 11/16; insulin continue, metformin was DC'd secondary to acute kidney injury SNF- continue Lantus insulin insulin 25 units nightly with sliding scale insulin before meals and nightly  Hyperlipidemia SNF- not stated as uncontrolled; continue Lipitor 40 mg daily  Multiple ulcers/unstageable sacral/stage I left  heel/stage II right heel; MRI sacrum ruled out osteomyelitis SNF- wound care nurse to follow   Time spent greater than 45 minutes;> 50% of time with patient was spent reviewing records, labs, tests and studies, counseling and developing plan of care  Thurston Hole D. [name], MD

## 2018-04-16 NOTE — Patient Outreach (Signed)
[facility] Care Management  04/16/2018  Jeremy Gill Oct 03, 1945 [id number]   72 year old  [facility] for a 30 day post discharge medication review.  PMHx includes, but not limited to, hypertension, dementia, COPD, type 2 diabetes mellitus and hyperlipidemia.   Successful outreach to Jeremy Gill's wife, Jeremy Gill.  HIPAA identifiers verified.   Jeremy Gill reports that Jeremy Gill is in rehab at [facility].    Plan: Will outreach to Jeremy Gill when he is discharged home.     Berlin HunJennifer Adalaide Jaskolski, PharmD Clinical Pharmacist [facility] [phone]

## 2018-04-18 ENCOUNTER — Other Ambulatory Visit: Payer: Self-pay | Admitting: Internal Medicine

## 2018-04-18 ENCOUNTER — Encounter: Payer: Self-pay | Admitting: Internal Medicine

## 2018-04-18 DIAGNOSIS — L89621 Pressure ulcer of left heel, stage 1: Secondary | ICD-10-CM | POA: Insufficient documentation

## 2018-04-18 DIAGNOSIS — F0280 Dementia in other diseases classified elsewhere without behavioral disturbance: Secondary | ICD-10-CM

## 2018-04-18 DIAGNOSIS — L89612 Pressure ulcer of right heel, stage 2: Secondary | ICD-10-CM | POA: Insufficient documentation

## 2018-04-18 DIAGNOSIS — N138 Other obstructive and reflux uropathy: Secondary | ICD-10-CM | POA: Insufficient documentation

## 2018-04-18 DIAGNOSIS — E1149 Type 2 diabetes mellitus with other diabetic neurological complication: Secondary | ICD-10-CM

## 2018-04-18 DIAGNOSIS — N401 Enlarged prostate with lower urinary tract symptoms: Secondary | ICD-10-CM

## 2018-04-18 DIAGNOSIS — G3183 Dementia with Lewy bodies: Secondary | ICD-10-CM

## 2018-04-18 NOTE — Progress Notes (Signed)
Palliative care referral placed for ambulatory setting.

## 2018-04-21 DIAGNOSIS — L89153 Pressure ulcer of sacral region, stage 3: Secondary | ICD-10-CM | POA: Diagnosis not present

## 2018-04-21 DIAGNOSIS — L89610 Pressure ulcer of right heel, unstageable: Secondary | ICD-10-CM | POA: Diagnosis not present

## 2018-04-21 LAB — BASIC METABOLIC PANEL
BUN: 31 — AB (ref 4–21)
Creatinine: 1.3 (ref 0.6–1.3)
Glucose: 135
Potassium: 4.5 (ref 3.4–5.3)
Sodium: 136 — AB (ref 137–147)

## 2018-04-21 LAB — CBC AND DIFFERENTIAL
HEMATOCRIT: 26 — AB (ref 41–53)
HEMOGLOBIN: 8.9 — AB (ref 13.5–17.5)
Platelets: 504 — AB (ref 150–399)
WBC: 12.4

## 2018-04-22 ENCOUNTER — Telehealth: Payer: Self-pay | Admitting: *Deleted

## 2018-04-22 DIAGNOSIS — Z515 Encounter for palliative care: Secondary | ICD-10-CM | POA: Diagnosis not present

## 2018-04-22 DIAGNOSIS — E1151 Type 2 diabetes mellitus with diabetic peripheral angiopathy without gangrene: Secondary | ICD-10-CM | POA: Diagnosis not present

## 2018-04-22 DIAGNOSIS — Z8701 Personal history of pneumonia (recurrent): Secondary | ICD-10-CM | POA: Diagnosis not present

## 2018-04-22 DIAGNOSIS — E1140 Type 2 diabetes mellitus with diabetic neuropathy, unspecified: Secondary | ICD-10-CM | POA: Diagnosis not present

## 2018-04-22 DIAGNOSIS — E1122 Type 2 diabetes mellitus with diabetic chronic kidney disease: Secondary | ICD-10-CM | POA: Diagnosis not present

## 2018-04-22 DIAGNOSIS — N189 Chronic kidney disease, unspecified: Secondary | ICD-10-CM | POA: Diagnosis not present

## 2018-04-22 DIAGNOSIS — I2510 Atherosclerotic heart disease of native coronary artery without angina pectoris: Secondary | ICD-10-CM | POA: Diagnosis not present

## 2018-04-22 DIAGNOSIS — I129 Hypertensive chronic kidney disease with stage 1 through stage 4 chronic kidney disease, or unspecified chronic kidney disease: Secondary | ICD-10-CM | POA: Diagnosis not present

## 2018-04-22 NOTE — Telephone Encounter (Signed)
His lower back shows chronic degenerative changes, but no acute findings.  If she would like the actual reports she can request them through medical records.  Thanks.  They should also be able to treat his back pain at the SNF.

## 2018-04-22 NOTE — Telephone Encounter (Signed)
Mrs Jeremy Gill called requesting information about Jeremy Gill's back pain.  He has been in the hospital and is now at [facility] rehab and is refusing PT because his back hurts.  She is asking about the results of x rays and scans that he has had done since seeing Dr Allena KatzPatel in 01/2016. I told her I would have to pass message on to Dr Allena KatzPatel to answer.

## 2018-04-22 NOTE — Telephone Encounter (Signed)
Mrs Borgmeyer notified.

## 2018-04-28 ENCOUNTER — Encounter: Payer: Self-pay | Admitting: Interventional Cardiology

## 2018-04-28 ENCOUNTER — Ambulatory Visit: Payer: PPO | Admitting: Interventional Cardiology

## 2018-04-28 VITALS — BP 142/80 | HR 69 | Ht 70.0 in | Wt 158.8 lb

## 2018-04-28 DIAGNOSIS — E1159 Type 2 diabetes mellitus with other circulatory complications: Secondary | ICD-10-CM | POA: Diagnosis not present

## 2018-04-28 DIAGNOSIS — L89153 Pressure ulcer of sacral region, stage 3: Secondary | ICD-10-CM | POA: Diagnosis not present

## 2018-04-28 DIAGNOSIS — I252 Old myocardial infarction: Secondary | ICD-10-CM | POA: Diagnosis not present

## 2018-04-28 DIAGNOSIS — R000 Tachycardia, unspecified: Secondary | ICD-10-CM

## 2018-04-28 DIAGNOSIS — L89610 Pressure ulcer of right heel, unstageable: Secondary | ICD-10-CM | POA: Diagnosis not present

## 2018-04-28 NOTE — Progress Notes (Signed)
Cardiology Office Note   Date:  04/28/2018   ID:  Jeremy Gill, DOB [birth year], MRN [id number]  PCP:  [place], [name], MD    No chief complaint on file.  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 male  Who has had DM since 2000.  He has had HTN as well, controled on meds.    I saw him in 2018.  It was noted that: "He has had a long h/o a high HR.  He now noted that his heart beat skips on occasion, lasting a few seconds.  It can then follow with a pressure in his chest that lasts a few seconds.  Walking is his most strenuous activity.  THis does not cause any discomfort in his chest.  Chronic back problems prevent him from doing any intentional walking.    His son passed away from a heart attack.  He does not know of anyone else in his family having heart trouble."  Low risk stress test in 7/18.  Hospital stay in 10/19: "Elevated troponin/chest pain/EKG changes/NSTEMI On admission patient was noted to have a point-of-care troponin at 0.83. Cardiac enzymes were cycled with troponins rising to as high as 1.64. Patient did have some chest pain early on this morning which has since resolved. EKG done showed some ST depressions in leads V3 through V6. Elevated troponin EKG changes may be secondary to demand ischemia however patient with history of COPD, diabetes, hyperlipidemia are concerning for possible acute coronary syndrome. 2D echo with a EF of 60 to 65%, grade 1 diastolic dysfunction. Serial cardiac enzymes slowly trending down. Fasting lipid panel with a LDL of 28. Patient currently on a heparin drip which was discontinued per cardiology today 03/16/2018. Patient has been seen in consultation by cardiology who feel that given patient's acute illness, anemia, acute kidney injury, confusion, probable  underlying dementia and overall poor shape we will treat medically at this time. Per cardiology after stabilization would consider cardiac catheterization at a later date in the outpatient setting.  Patient maintained on aspirin, beta-blocker, imdur, statin.   Outpatient follow-up with cardiology."    He was hospitalized in 11/19.  He had multiple medical issues at that time and was in the hospital for 10 days.  ECG showed sinus tach so was sent here.  He still has back pain, and is taking tramadol.   He is in rehab currently at [facility].   Past Medical History:  Diagnosis Date  . ANKLE SPRAIN 12/16/2006   Qualifier: Diagnosis of  By: Tawanna Cooler MD, Eugenio Hoes   . ANXIETY 10/25/2006  . CELLULITIS, LEG, RIGHT 09/08/2007   Qualifier: Diagnosis of  By: Tawanna Cooler MD, Eugenio Hoes   . Chronic pain disorder 11/30/2015  . COPD 10/25/2006  . 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 DIABETES MELLITUS, TYPE I 10/25/2006  . Diabetic polyneuropathy (HCC) 04/08/2007   Qualifier: Diagnosis of  By: Tawanna Cooler MD, Eugenio Hoes   . 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 Essential hypertension 10/25/2006   Qualifier: Diagnosis of  By: Rosette Reveal RN, Jorene Minors   . Generalized osteoarthritis of multiple sites 11/30/2015  . HYPERTENSION 10/25/2006  .  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 Low back pain with radiation 01/23/2016  . Polyneuropathy in diabetes(357.2) 04/08/2007  . Psoriasis   . TOBACCO ABUSE 07/08/2007  . VENOUS INSUFFICIENCY 11/25/2007    Past Surgical History:  Procedure Laterality Date  . TESTICLE REMOVAL     R testicle     Current Outpatient Medications  Medication Sig Dispense Refill  . ACCU-CHEK SOFTCLIX LANCETS lancets Use as instructed 100 each 12  . acetaminophen (TYLENOL) 325 MG tablet Take 650 mg by mouth every 6 (six) hours as needed.    Marland Kitchen amLODipine (NORVASC) 5  MG tablet Take 1 tablet (5 mg total) by mouth daily.    Marland Kitchen aspirin EC 81 MG tablet Take 1 tablet (81 mg total) by mouth daily. 90 tablet 3  . atorvastatin (LIPITOR) 40 MG tablet Take 1 tablet (40 mg total) by mouth daily. 90 tablet 3  . baclofen (LIORESAL) 10 MG tablet Take 0.5 tablets (5 mg total) by mouth 3 (three) times daily as needed for muscle spasms. 90 tablet 1  . budesonide-formoterol (SYMBICORT) 160-4.5 MCG/ACT inhaler Inhale 2 puffs into the lungs 2 (two) times daily. 1 Inhaler 0  . DULoxetine (CYMBALTA) 60 MG capsule TAKE 1 CAPSULE BY MOUTH EVERY DAY (Patient taking differently: Take 60 mg by mouth daily. ) 30 capsule 0  . folic acid (FOLVITE) 1 MG tablet Take 1 tablet (1 mg total) by mouth daily.    Marland Kitchen gabapentin (NEURONTIN) 800 MG tablet Take 800 mg by mouth 3 (three) times daily.    Marland Kitchen glucose blood (ONETOUCH VERIO) test strip Use to test blood sugar three-four times daily. 400 each 3  . insulin aspart (NOVOLOG) 100 UNIT/ML injection Inject 6 Units into the skin 3 (three) times daily with meals. 10 mL 0  . Insulin Glargine (LANTUS SOLOSTAR) 100 UNIT/ML Solostar Pen Inject 28 Units into the skin at bedtime. (Patient taking differently: Inject 20 Units into the skin at bedtime. ) 5 pen 0  . Insulin Syringe-Needle U-100 (INSULIN SYRINGE .5CC/31GX5/16") 31G X 5/16" 0.5 ML MISC USE AS DIRECTED WITH LANTUS 100 each 5  . ipratropium-albuterol (DUONEB) 0.5-2.5 (3) MG/3ML SOLN Take 3 mLs by nebulization every 6 (six) hours as needed. (Patient taking differently: Take 3 mLs by nebulization every 6 (six) hours as needed (wheezing). ) 360 mL 0  . isosorbide mononitrate (IMDUR) 30 MG 24 hr tablet Take 1 tablet (30 mg total) by mouth daily. 30 tablet 0  . metoprolol tartrate (LOPRESSOR) 50 MG tablet Take 1 tablet (50 mg total) by mouth 2 (two) times daily. (Patient taking differently: Take 25 mg by mouth daily. ) 60 tablet 0  . NONFORMULARY OR COMPOUNDED ITEM Apply 1 application topically 2 (two) times  daily as needed (rash). 1:1 mixture of eucerin and triamcinolone 0.1% ointment    . polyethylene glycol (MIRALAX / GLYCOLAX) packet Take 17 g by mouth daily. (Patient taking differently: Take 17 g by mouth daily as needed for mild constipation. ) 14 each 0  . tamsulosin (FLOMAX) 0.4 MG CAPS capsule Take 1 capsule (0.4 mg total) by mouth daily after breakfast. NEED OV. 30 capsule 0  . traMADol (ULTRAM) 50 MG tablet Take 0.5 tablets (25 mg total) by mouth every 8 (eight) hours as needed for up to 20 days for moderate pain. 30 tablet 0  . vitamin B-12 (CYANOCOBALAMIN) 1000 MCG tablet Take 1,000 mcg by mouth daily.  No current facility-administered medications for this visit.     Allergies:   Patient has no known allergies.    Social History:  The patient  reports that he has been smoking pipe. He has never used smokeless tobacco. He reports that he does not drink alcohol or use drugs.   Family History:  The patient's family history includes Cancer in his mother; Diabetes in his sister.    ROS:  Please see the history of present illness.   Otherwise, review of systems are positive for back pain.   All other systems are reviewed and negative.    PHYSICAL EXAM: VS:  BP (!) 142/80   Pulse 69   Ht 5\' 10"  (1.778 m)   Wt 158 lb 12.8 oz (72 kg)   SpO2 99%   BMI 22.79 kg/m  , BMI Body mass index is 22.79 kg/m. GEN: Frail HEENT: normal  Neck: no JVD, carotid bruits, or masses Cardiac: RRR; no murmurs, rubs, or gallops,no edema  Respiratory:  clear to auscultation bilaterally, normal work of breathing GI: soft, nontender, nondistended, + BS MS: no deformity or atrophy  Skin: warm and dry, no rash Neuro:  Strength and sensation are intact Psych: euthymic mood, full affect   EKG:   The ekg ordered 11/19 today demonstrates sinus tach with ST depression diffusely   Recent Labs: 01/14/2018: TSH 0.760 03/14/2018: ALT 14 03/18/2018: Magnesium 1.9 04/21/2018: BUN 31; Creatinine 1.3;  Hemoglobin 8.9; Platelets 504; Potassium 4.5; Sodium 136   Lipid Panel    Component Value Date/Time   CHOL 67 03/14/2018 0928   CHOL 108 02/26/2017 1630   TRIG 49 03/14/2018 0928   TRIG 20 03/05/2006 1042   HDL 29 (L) 03/14/2018 0928   HDL 58 02/26/2017 1630   CHOLHDL 2.3 03/14/2018 0928   VLDL 10 03/14/2018 0928   LDLCALC 28 03/14/2018 0928   LDLCALC 43 02/26/2017 1630   LDLDIRECT 141.8 09/30/2008 0851     Other studies Reviewed: Additional studies/ records that were reviewed today with results demonstrating: hospital records reviewed.   ASSESSMENT AND PLAN:  1. Old MI:  Troponin elevation in 10/19.  Was likely demand ischemia in the setting of sepsis.  He was hospitalized again in November 2019 with mental status changes in the setting of UTI.  He also had severe back pain.  He denies any chest discomfort.  He has had chronic issues with heart rates above 100, up to 115.  He denies palpitations.  His most limiting symptom is back pain.  His narcotics have been stopped and he was put on tramadol, and his pain is increased.  Walking is limited by his back pain as he came to the clinic today in a wheelchair.  Echocardiogram done in October 2019 showed normal LV function, which supports the diagnosis that his elevated troponin was more from demand ischemia rather than acute coronary syndrome.  Given his comorbidities and lack of symptoms, would not pursue cardiac cath at this time. 2. Diabetes: A1c was 9.1 in August 2019.  Exercise limited.  He will have to try to eat healthy.  Managed by primary care. 3. Tachycardia: Noted in the hospital.  Today, heart rate is 69.  His pain is better today.  Perhaps the higher heart rates were related to his UTI or sepsis or severe pain.   Current medicines are reviewed at length with the patient today.  The patient concerns regarding his medicines were addressed.  The following changes have been made:  No  change  Labs/ tests ordered today include:    No orders of the defined types were placed in this encounter.   Recommend 150 minutes/week of aerobic exercise Low fat, low carb, high fiber diet recommended  Disposition:   FU in 1 year   Signed, Lance Muss, MD  04/28/2018 3:21 PM    [facility] [address] [place], [place], [place]  [address] Phone: [phone]; Fax: [phone]

## 2018-04-28 NOTE — Patient Instructions (Signed)

## 2018-05-01 ENCOUNTER — Other Ambulatory Visit: Payer: Self-pay | Admitting: *Deleted

## 2018-05-01 DIAGNOSIS — R338 Other retention of urine: Secondary | ICD-10-CM | POA: Diagnosis not present

## 2018-05-01 NOTE — Patient Outreach (Signed)
[facility]  05/01/2018  Jeremy SalinesJames E Gill Oct 23, 1945 [id number]   Facility site visit at [facility].  Spoke with Marylin CrosbyKristy Goolsby SW/discharge planner.  [name] states patient plans to go home when he is stronger.  Areas to sacrum and heel are healing according to nurses notes.  Patient continues to have a foley.  Patient has been seen by Palliative care while at the facility but the notes were not available to Ms. Goolsby at this time.  [name] states patient was brought back to the facility after a hospitalization for UTI related to urinary retention.  No discharge date planned at this time.   Went by to see patient in room 507.  Patient was resting in bed with eyes closed.  Patient roused slightly when I spoke his name, but was too drowsy to talk.  Left patient a [facility] Packet with my contact information at the bedside for patient and spouse.   Placed a call to spouse and main caregiver Ms. [name].  [name] made me aware patient had attended the urologist today and it had made him very tired.  She states the patient continued to have urinary retention according to the MD appointment today and will have to continue to use foley catheter.  She states patient continues to be weak but she is hopeful he will be able to make gains in PT. [name] states she is also the caregiver to her mother who is 90y/o. She states she does not have any close by family to assist her, one of her sons passed away in 2016 and the other son lives in [place].  Spouse familiar with [facility] Care Management services and continues to be agreeable to services.  Made her aware contact information was left at the patient's bedside if she needed anything to please reach out to me.   Plan to continue to monitor patient for discharge date and discharge needs.  Plan to make [name] aware of discharge date when known.   Costella HatcherJanci Lashunda Greis RN,  BSN [facility]  Post Acute Care Coordinator [phone]) Business Mobile [phone] Toll free office

## 2018-05-04 ENCOUNTER — Inpatient Hospital Stay (HOSPITAL_COMMUNITY)
Admission: EM | Admit: 2018-05-04 | Discharge: 2018-05-09 | DRG: 871 | Disposition: A | Discharge status: Home / Self Care | Payer: PPO | Source: Skilled Nursing Facility | Attending: Internal Medicine | Admitting: Internal Medicine

## 2018-05-04 ENCOUNTER — Emergency Department (HOSPITAL_COMMUNITY): Payer: PPO

## 2018-05-04 ENCOUNTER — Encounter (HOSPITAL_COMMUNITY): Payer: Self-pay | Admitting: *Deleted

## 2018-05-04 ENCOUNTER — Other Ambulatory Visit: Payer: Self-pay

## 2018-05-04 DIAGNOSIS — E1165 Type 2 diabetes mellitus with hyperglycemia: Secondary | ICD-10-CM | POA: Diagnosis not present

## 2018-05-04 DIAGNOSIS — R0902 Hypoxemia: Secondary | ICD-10-CM | POA: Diagnosis not present

## 2018-05-04 DIAGNOSIS — R000 Tachycardia, unspecified: Secondary | ICD-10-CM | POA: Diagnosis not present

## 2018-05-04 DIAGNOSIS — Z7982 Long term (current) use of aspirin: Secondary | ICD-10-CM | POA: Diagnosis not present

## 2018-05-04 DIAGNOSIS — D72829 Elevated white blood cell count, unspecified: Secondary | ICD-10-CM | POA: Diagnosis not present

## 2018-05-04 DIAGNOSIS — R402142 Coma scale, eyes open, spontaneous, at arrival to emergency department: Secondary | ICD-10-CM | POA: Diagnosis present

## 2018-05-04 DIAGNOSIS — Z7951 Long term (current) use of inhaled steroids: Secondary | ICD-10-CM | POA: Diagnosis not present

## 2018-05-04 DIAGNOSIS — D631 Anemia in chronic kidney disease: Secondary | ICD-10-CM | POA: Diagnosis present

## 2018-05-04 DIAGNOSIS — A419 Sepsis, unspecified organism: Secondary | ICD-10-CM | POA: Diagnosis present

## 2018-05-04 DIAGNOSIS — R0689 Other abnormalities of breathing: Secondary | ICD-10-CM | POA: Diagnosis not present

## 2018-05-04 DIAGNOSIS — R402232 Coma scale, best verbal response, inappropriate words, at arrival to emergency department: Secondary | ICD-10-CM | POA: Diagnosis not present

## 2018-05-04 DIAGNOSIS — N183 Chronic kidney disease, stage 3 (moderate): Secondary | ICD-10-CM | POA: Diagnosis present

## 2018-05-04 DIAGNOSIS — J9601 Acute respiratory failure with hypoxia: Secondary | ICD-10-CM | POA: Diagnosis present

## 2018-05-04 DIAGNOSIS — R404 Transient alteration of awareness: Secondary | ICD-10-CM | POA: Diagnosis not present

## 2018-05-04 DIAGNOSIS — J181 Lobar pneumonia, unspecified organism: Secondary | ICD-10-CM | POA: Diagnosis not present

## 2018-05-04 DIAGNOSIS — N138 Other obstructive and reflux uropathy: Secondary | ICD-10-CM | POA: Diagnosis not present

## 2018-05-04 DIAGNOSIS — Z794 Long term (current) use of insulin: Secondary | ICD-10-CM

## 2018-05-04 DIAGNOSIS — L409 Psoriasis, unspecified: Secondary | ICD-10-CM | POA: Diagnosis not present

## 2018-05-04 DIAGNOSIS — R402312 Coma scale, best motor response, none, at arrival to emergency department: Secondary | ICD-10-CM | POA: Diagnosis not present

## 2018-05-04 DIAGNOSIS — G8929 Other chronic pain: Secondary | ICD-10-CM | POA: Diagnosis present

## 2018-05-04 DIAGNOSIS — Z7401 Bed confinement status: Secondary | ICD-10-CM | POA: Diagnosis not present

## 2018-05-04 DIAGNOSIS — M159 Polyosteoarthritis, unspecified: Secondary | ICD-10-CM | POA: Diagnosis not present

## 2018-05-04 DIAGNOSIS — N179 Acute kidney failure, unspecified: Secondary | ICD-10-CM | POA: Diagnosis present

## 2018-05-04 DIAGNOSIS — I129 Hypertensive chronic kidney disease with stage 1 through stage 4 chronic kidney disease, or unspecified chronic kidney disease: Secondary | ICD-10-CM | POA: Diagnosis not present

## 2018-05-04 DIAGNOSIS — J189 Pneumonia, unspecified organism: Secondary | ICD-10-CM | POA: Diagnosis present

## 2018-05-04 DIAGNOSIS — F17290 Nicotine dependence, other tobacco product, uncomplicated: Secondary | ICD-10-CM | POA: Diagnosis present

## 2018-05-04 DIAGNOSIS — Z833 Family history of diabetes mellitus: Secondary | ICD-10-CM | POA: Diagnosis not present

## 2018-05-04 DIAGNOSIS — J690 Pneumonitis due to inhalation of food and vomit: Secondary | ICD-10-CM | POA: Diagnosis present

## 2018-05-04 DIAGNOSIS — R509 Fever, unspecified: Secondary | ICD-10-CM | POA: Diagnosis not present

## 2018-05-04 DIAGNOSIS — J180 Bronchopneumonia, unspecified organism: Secondary | ICD-10-CM | POA: Diagnosis not present

## 2018-05-04 DIAGNOSIS — M2550 Pain in unspecified joint: Secondary | ICD-10-CM | POA: Diagnosis not present

## 2018-05-04 DIAGNOSIS — E876 Hypokalemia: Secondary | ICD-10-CM | POA: Diagnosis not present

## 2018-05-04 DIAGNOSIS — F0390 Unspecified dementia without behavioral disturbance: Secondary | ICD-10-CM | POA: Diagnosis not present

## 2018-05-04 DIAGNOSIS — I10 Essential (primary) hypertension: Secondary | ICD-10-CM | POA: Diagnosis not present

## 2018-05-04 DIAGNOSIS — Z87440 Personal history of urinary (tract) infections: Secondary | ICD-10-CM | POA: Diagnosis not present

## 2018-05-04 DIAGNOSIS — E785 Hyperlipidemia, unspecified: Secondary | ICD-10-CM | POA: Diagnosis present

## 2018-05-04 DIAGNOSIS — R4182 Altered mental status, unspecified: Secondary | ICD-10-CM | POA: Diagnosis not present

## 2018-05-04 DIAGNOSIS — E1122 Type 2 diabetes mellitus with diabetic chronic kidney disease: Secondary | ICD-10-CM | POA: Diagnosis present

## 2018-05-04 DIAGNOSIS — Z809 Family history of malignant neoplasm, unspecified: Secondary | ICD-10-CM | POA: Diagnosis not present

## 2018-05-04 DIAGNOSIS — I252 Old myocardial infarction: Secondary | ICD-10-CM

## 2018-05-04 DIAGNOSIS — E11649 Type 2 diabetes mellitus with hypoglycemia without coma: Secondary | ICD-10-CM | POA: Diagnosis present

## 2018-05-04 DIAGNOSIS — N401 Enlarged prostate with lower urinary tract symptoms: Secondary | ICD-10-CM | POA: Diagnosis present

## 2018-05-04 DIAGNOSIS — Z66 Do not resuscitate: Secondary | ICD-10-CM | POA: Diagnosis present

## 2018-05-04 DIAGNOSIS — G9341 Metabolic encephalopathy: Secondary | ICD-10-CM | POA: Diagnosis present

## 2018-05-04 DIAGNOSIS — J449 Chronic obstructive pulmonary disease, unspecified: Secondary | ICD-10-CM | POA: Diagnosis present

## 2018-05-04 DIAGNOSIS — E1142 Type 2 diabetes mellitus with diabetic polyneuropathy: Secondary | ICD-10-CM | POA: Diagnosis present

## 2018-05-04 LAB — BLOOD GAS, ARTERIAL
Acid-base deficit: 0.4 mmol/L (ref 0.0–2.0)
Bicarbonate: 23.6 mmol/L (ref 20.0–28.0)
Drawn by: 331471
O2 Content: 10 L/min
O2 Saturation: 98.1 %
PCO2 ART: 38.3 mmHg (ref 32.0–48.0)
Patient temperature: 98.6
pH, Arterial: 7.406 (ref 7.350–7.450)
pO2, Arterial: 118 mmHg — ABNORMAL HIGH (ref 83.0–108.0)

## 2018-05-04 LAB — CBC WITH DIFFERENTIAL/PLATELET
ABS IMMATURE GRANULOCYTES: 0.08 10*3/uL — AB (ref 0.00–0.07)
BASOS PCT: 0 %
Basophils Absolute: 0 10*3/uL (ref 0.0–0.1)
EOS PCT: 0 %
Eosinophils Absolute: 0 10*3/uL (ref 0.0–0.5)
HCT: 27.2 % — ABNORMAL LOW (ref 39.0–52.0)
HEMOGLOBIN: 8.6 g/dL — AB (ref 13.0–17.0)
IMMATURE GRANULOCYTES: 1 %
Lymphocytes Relative: 17 %
Lymphs Abs: 2.4 10*3/uL (ref 0.7–4.0)
MCH: 29.6 pg (ref 26.0–34.0)
MCHC: 31.6 g/dL (ref 30.0–36.0)
MCV: 93.5 fL (ref 80.0–100.0)
MONO ABS: 0.7 10*3/uL (ref 0.1–1.0)
MONOS PCT: 5 %
Neutro Abs: 10.9 10*3/uL — ABNORMAL HIGH (ref 1.7–7.7)
Neutrophils Relative %: 77 %
Platelets: 212 10*3/uL (ref 150–400)
RBC: 2.91 MIL/uL — AB (ref 4.22–5.81)
RDW: 14.6 % (ref 11.5–15.5)
WBC: 14 10*3/uL — AB (ref 4.0–10.5)
nRBC: 0 % (ref 0.0–0.2)

## 2018-05-04 LAB — COMPREHENSIVE METABOLIC PANEL
ALBUMIN: 2.7 g/dL — AB (ref 3.5–5.0)
ALT: 41 U/L (ref 0–44)
AST: 85 U/L — AB (ref 15–41)
Alkaline Phosphatase: 73 U/L (ref 38–126)
Anion gap: 13 (ref 5–15)
BUN: 59 mg/dL — AB (ref 8–23)
CHLORIDE: 103 mmol/L (ref 98–111)
CO2: 23 mmol/L (ref 22–32)
CREATININE: 2.1 mg/dL — AB (ref 0.61–1.24)
Calcium: 8.7 mg/dL — ABNORMAL LOW (ref 8.9–10.3)
GFR calc Af Amer: 35 mL/min — ABNORMAL LOW (ref >60)
GFR, EST NON AFRICAN AMERICAN: 31 mL/min — AB (ref >60)
GLUCOSE: 58 mg/dL — AB (ref 70–99)
Potassium: 3.4 mmol/L — ABNORMAL LOW (ref 3.5–5.1)
Sodium: 139 mmol/L (ref 135–145)
Total Bilirubin: 1 mg/dL (ref 0.3–1.2)
Total Protein: 7.3 g/dL (ref 6.5–8.1)

## 2018-05-04 LAB — URINALYSIS, ROUTINE W REFLEX MICROSCOPIC
BILIRUBIN URINE: NEGATIVE
GLUCOSE, UA: NEGATIVE mg/dL
Ketones, ur: NEGATIVE mg/dL
NITRITE: NEGATIVE
PH: 6 (ref 5.0–8.0)
Protein, ur: 100 mg/dL — AB
SPECIFIC GRAVITY, URINE: 1.017 (ref 1.005–1.030)

## 2018-05-04 LAB — GLUCOSE, CAPILLARY
Glucose-Capillary: 61 mg/dL — ABNORMAL LOW (ref 70–99)
Glucose-Capillary: 106 mg/dL — ABNORMAL HIGH (ref 70–99)

## 2018-05-04 LAB — I-STAT CG4 LACTIC ACID, ED
Lactic Acid, Venous: 0.69 mmol/L (ref 0.5–1.9)
Lactic Acid, Venous: 0.65 mmol/L (ref 0.5–1.9)

## 2018-05-04 LAB — BRAIN NATRIURETIC PEPTIDE: B Natriuretic Peptide: 1141.1 pg/mL — ABNORMAL HIGH (ref 0.0–100.0)

## 2018-05-04 LAB — MRSA PCR SCREENING: MRSA by PCR: NEGATIVE

## 2018-05-04 MED ORDER — ATORVASTATIN CALCIUM 40 MG PO TABS
40.0000 mg | ORAL_TABLET | Freq: Every day | ORAL | Status: DC
  Administered 2018-05-05 – 2018-05-09 (×5): 40 mg via ORAL
  Filled 2018-05-04 (×5): qty 1

## 2018-05-04 MED ORDER — ACETAMINOPHEN 650 MG RE SUPP
650.0000 mg | Freq: Once | RECTAL | Status: AC
  Administered 2018-05-04: 650 mg via RECTAL
  Filled 2018-05-04: qty 1

## 2018-05-04 MED ORDER — VANCOMYCIN HCL IN DEXTROSE 1-5 GM/200ML-% IV SOLN
1000.0000 mg | Freq: Once | INTRAVENOUS | Status: AC
  Administered 2018-05-04: 1000 mg via INTRAVENOUS
  Filled 2018-05-04: qty 200

## 2018-05-04 MED ORDER — FOLIC ACID 1 MG PO TABS
1.0000 mg | ORAL_TABLET | Freq: Every day | ORAL | Status: DC
  Administered 2018-05-05 – 2018-05-09 (×5): 1 mg via ORAL
  Filled 2018-05-04 (×5): qty 1

## 2018-05-04 MED ORDER — MOMETASONE FURO-FORMOTEROL FUM 200-5 MCG/ACT IN AERO
2.0000 | INHALATION_SPRAY | Freq: Two times a day (BID) | RESPIRATORY_TRACT | Status: DC
  Administered 2018-05-06 – 2018-05-09 (×6): 2 via RESPIRATORY_TRACT
  Filled 2018-05-04 (×2): qty 8.8

## 2018-05-04 MED ORDER — ONDANSETRON HCL 4 MG PO TABS: 4.0000 mg | ORAL_TABLET | Freq: Four times a day (QID) | ORAL | Status: DC | PRN

## 2018-05-04 MED ORDER — SODIUM CHLORIDE 0.9 % IV SOLN
1000.0000 mL | INTRAVENOUS | Status: DC
  Administered 2018-05-04: 1000 mL via INTRAVENOUS

## 2018-05-04 MED ORDER — ONDANSETRON HCL 4 MG/2ML IJ SOLN: 4.0000 mg | Freq: Four times a day (QID) | INTRAMUSCULAR | Status: DC | PRN

## 2018-05-04 MED ORDER — INSULIN ASPART 100 UNIT/ML ~~LOC~~ SOLN
0.0000 [IU] | Freq: Three times a day (TID) | SUBCUTANEOUS | Status: DC
  Administered 2018-05-05: 1 [IU] via SUBCUTANEOUS
  Administered 2018-05-05: 9 [IU] via SUBCUTANEOUS

## 2018-05-04 MED ORDER — DEXTROSE 50 % IV SOLN
INTRAVENOUS | Status: AC
  Administered 2018-05-04: 25 mL
  Filled 2018-05-04: qty 50

## 2018-05-04 MED ORDER — SODIUM CHLORIDE 0.9 % IV SOLN
INTRAVENOUS | Status: DC
  Administered 2018-05-04 – 2018-05-06 (×4): via INTRAVENOUS

## 2018-05-04 MED ORDER — ACETAMINOPHEN 325 MG PO TABS: 650.0000 mg | ORAL_TABLET | Freq: Four times a day (QID) | ORAL | Status: DC | PRN

## 2018-05-04 MED ORDER — TAMSULOSIN HCL 0.4 MG PO CAPS
0.4000 mg | ORAL_CAPSULE | Freq: Every day | ORAL | Status: DC
  Administered 2018-05-05 – 2018-05-09 (×5): 0.4 mg via ORAL
  Filled 2018-05-04 (×5): qty 1

## 2018-05-04 MED ORDER — DULOXETINE HCL 30 MG PO CPEP
60.0000 mg | ORAL_CAPSULE | Freq: Every day | ORAL | Status: DC
  Administered 2018-05-05 – 2018-05-09 (×5): 60 mg via ORAL
  Filled 2018-05-04 (×5): qty 2

## 2018-05-04 MED ORDER — ACETAMINOPHEN 650 MG RE SUPP: 650.0000 mg | Freq: Four times a day (QID) | RECTAL | Status: DC | PRN

## 2018-05-04 MED ORDER — BACLOFEN 10 MG PO TABS: 5.0000 mg | ORAL_TABLET | Freq: Three times a day (TID) | ORAL | Status: DC | PRN

## 2018-05-04 MED ORDER — SODIUM CHLORIDE 0.9 % IV SOLN
3.0000 g | Freq: Three times a day (TID) | INTRAVENOUS | Status: DC
  Administered 2018-05-04 – 2018-05-08 (×12): 3 g via INTRAVENOUS
  Filled 2018-05-04 (×13): qty 3

## 2018-05-04 MED ORDER — GABAPENTIN 400 MG PO CAPS
800.0000 mg | ORAL_CAPSULE | Freq: Three times a day (TID) | ORAL | Status: DC
  Administered 2018-05-05 – 2018-05-06 (×6): 800 mg via ORAL
  Filled 2018-05-04 (×5): qty 2
  Filled 2018-05-04: qty 8

## 2018-05-04 MED ORDER — IPRATROPIUM-ALBUTEROL 0.5-2.5 (3) MG/3ML IN SOLN: 3.0000 mL | RESPIRATORY_TRACT | Status: DC | PRN

## 2018-05-04 MED ORDER — ASPIRIN EC 81 MG PO TBEC
81.0000 mg | DELAYED_RELEASE_TABLET | Freq: Every day | ORAL | Status: DC
  Administered 2018-05-05 – 2018-05-09 (×5): 81 mg via ORAL
  Filled 2018-05-04 (×5): qty 1

## 2018-05-04 MED ORDER — SODIUM CHLORIDE 0.9 % IV SOLN
2.0000 g | Freq: Once | INTRAVENOUS | Status: AC
  Administered 2018-05-04: 2 g via INTRAVENOUS
  Filled 2018-05-04: qty 2

## 2018-05-04 MED ORDER — POLYETHYLENE GLYCOL 3350 17 G PO PACK
17.0000 g | PACK | Freq: Every day | ORAL | Status: DC | PRN
  Filled 2018-05-04: qty 1

## 2018-05-04 MED ORDER — SODIUM CHLORIDE 0.9 % IV SOLN
500.0000 mg | INTRAVENOUS | Status: DC
  Administered 2018-05-04: 500 mg via INTRAVENOUS
  Filled 2018-05-04: qty 500

## 2018-05-04 NOTE — ED Notes (Signed)
ED TO INPATIENT HANDOFF REPORT  Name/Age/Gender Jeremy Gill 72 y.o. male  Code Status Code Status History    Date Active Date Inactive Code Status Order ID Comments User Context   04/08/2018 2058 04/15/2018 2000 Full Code [id number]  Elder Love, MD ED   03/13/2018 1537 03/18/2018 1944 Full Code [id number]  Leatha Gilding, MD ED   01/14/2018 0009 01/17/2018 1935 Full Code [id number]  Therisa Doyne, MD Inpatient   10/01/2017 0016 10/04/2017 1808 Full Code [id number]  Lorretta Harp, MD ED   12/18/2016 1924 12/20/2016 1635 Full Code [id number]  Pearson Grippe, MD Inpatient    Advance Directive Documentation     Most Recent Value  Type of Advance Directive  Out of facility DNR (pink MOST or yellow form)  Pre-existing out of facility DNR order (yellow form or pink MOST form)  -  "MOST" Form in Place?  -      Home/SNF/Other Given to floor  Chief Complaint sepsis  Level of Care/Admitting Diagnosis ED Disposition    ED Disposition Condition Comment   Admit  Hospital Area: [facility] [[id number]]  Level of Care: Med-Surg [16]  Diagnosis: Pneumonia [227785]  Admitting Physician: Coralie Keens [[id number]]  Attending Physician: Coralie Keens [[id number]]  Estimated length of stay: 3 - 4 days  Certification:: I certify this patient will need inpatient services for at least 2 midnights  PT Class (Do Not Modify): Inpatient [101]  PT Acc Code (Do Not Modify): Private [1]       Medical History Past Medical History:  Diagnosis Date  . ANKLE SPRAIN 12/16/2006   Qualifier: Diagnosis of  By: Tawanna Cooler MD, Eugenio Hoes   . ANXIETY 10/25/2006  . CELLULITIS, LEG, RIGHT 09/08/2007   Qualifier: Diagnosis of  By: Tawanna Cooler MD, Eugenio Hoes   . Chronic pain disorder 11/30/2015  . COPD 10/25/2006  . 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 DIABETES MELLITUS, TYPE I 10/25/2006  .  Diabetic polyneuropathy (HCC) 04/08/2007   Qualifier: Diagnosis of  By: Tawanna Cooler MD, Eugenio Hoes   . 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 Essential hypertension 10/25/2006   Qualifier: Diagnosis of  By: Rosette Reveal RN, Jorene Minors   . Generalized osteoarthritis of multiple sites 11/30/2015  . HYPERTENSION 10/25/2006  . 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 Low back pain with radiation 01/23/2016  . Polyneuropathy in diabetes(357.2) 04/08/2007  . Psoriasis   . TOBACCO ABUSE 07/08/2007  . VENOUS INSUFFICIENCY 11/25/2007    Allergies No Known Allergies  IV Location/Drains/Wounds Patient Lines/Drains/Airways Status   Active Line/Drains/Airways    Name:   Placement date:   Placement time:   Site:   Days:   Peripheral IV 05/04/18 Left Forearm   05/04/18    1058    Forearm   less than 1   Peripheral IV 05/04/18 Right Forearm   05/04/18    1129    Forearm   less than 1   External Urinary Catheter   04/13/18    1756    -   21   Pressure Injury 04/08/18 Stage II -  Partial thickness loss of dermis presenting as a shallow open ulcer with a red, pink wound bed without slough.   04/08/18    2301     26   Pressure Injury 04/08/18 Stage II -  Partial  thickness loss of dermis presenting as a shallow open ulcer with a red, pink wound bed without slough.   04/08/18    2242     26   Pressure Injury 04/09/18 Unstageable - Full thickness tissue loss in which the base of the ulcer is covered by slough (yellow, tan, gray, green or brown) and/or eschar (tan, brown or black) in the wound bed. wound bed obscured with slough   04/09/18    1059     25   Pressure Injury 04/09/18 Stage II -  Partial thickness loss of dermis presenting as a shallow open ulcer with a red, pink wound bed without slough.   04/09/18    1102     25   Wound / Incision (Open or Dehisced) 04/08/18 Other (Comment) Buttocks Mid   04/08/18    2330    Buttocks   26          Labs/Imaging Results for orders  placed or performed during the hospital encounter of 05/04/18 (from the past 48 hour(s))  Comprehensive metabolic panel     Status: Abnormal   Collection Time: 05/04/18 11:28 AM  Result Value Ref Range   Sodium 139 135 - 145 mmol/L   Potassium 3.4 (L) 3.5 - 5.1 mmol/L   Chloride 103 98 - 111 mmol/L   CO2 23 22 - 32 mmol/L   Glucose, Bld 58 (L) 70 - 99 mg/dL   BUN 59 (H) 8 - 23 mg/dL   Creatinine, Ser 4.09 (H) 0.61 - 1.24 mg/dL   Calcium 8.7 (L) 8.9 - 10.3 mg/dL   Total Protein 7.3 6.5 - 8.1 g/dL   Albumin 2.7 (L) 3.5 - 5.0 g/dL   AST 85 (H) 15 - 41 U/L   ALT 41 0 - 44 U/L   Alkaline Phosphatase 73 38 - 126 U/L   Total Bilirubin 1.0 0.3 - 1.2 mg/dL   GFR calc non Af Amer 31 (L) >60 mL/min   GFR calc Af Amer 35 (L) >60 mL/min   Anion gap 13 5 - 15    Comment: Performed at [facility], [address], [place], [place] [address]  CBC WITH DIFFERENTIAL     Status: Abnormal   Collection Time: 05/04/18 11:28 AM  Result Value Ref Range   WBC 14.0 (H) 4.0 - 10.5 K/uL   RBC 2.91 (L) 4.22 - 5.81 MIL/uL   Hemoglobin 8.6 (L) 13.0 - 17.0 g/dL   HCT 47.8 (L) 29.5 - 62.1 %   MCV 93.5 80.0 - 100.0 fL   MCH 29.6 26.0 - 34.0 pg   MCHC 31.6 30.0 - 36.0 g/dL   RDW 30.8 65.7 - 84.6 %   Platelets 212 150 - 400 K/uL   nRBC 0.0 0.0 - 0.2 %   Neutrophils Relative % 77 %   Neutro Abs 10.9 (H) 1.7 - 7.7 K/uL   Lymphocytes Relative 17 %   Lymphs Abs 2.4 0.7 - 4.0 K/uL   Monocytes Relative 5 %   Monocytes Absolute 0.7 0.1 - 1.0 K/uL   Eosinophils Relative 0 %   Eosinophils Absolute 0.0 0.0 - 0.5 K/uL   Basophils Relative 0 %   Basophils Absolute 0.0 0.0 - 0.1 K/uL   Immature Granulocytes 1 %   Abs Immature Granulocytes 0.08 (H) 0.00 - 0.07 K/uL   Dohle Bodies PRESENT     Comment: Performed at [facility], [address] [place], [place] [address]  Brain natriuretic peptide  Status: Abnormal   Collection Time: 05/04/18 11:33 AM  Result Value Ref Range    B Natriuretic Peptide 1,141.1 (H) 0.0 - 100.0 pg/mL    Comment: Performed at [facility], [address] [place], [place] [address]  I-Stat CG4 Lactic Acid, ED     Status: None   Collection Time: 05/04/18 11:35 AM  Result Value Ref Range   Lactic Acid, Venous 0.69 0.5 - 1.9 mmol/L  Urinalysis, Routine w reflex microscopic     Status: Abnormal   Collection Time: 05/04/18 11:56 AM  Result Value Ref Range   Color, Urine AMBER (A) YELLOW    Comment: BIOCHEMICALS MAY BE AFFECTED BY COLOR   APPearance CLOUDY (A) CLEAR   Specific Gravity, Urine 1.017 1.005 - 1.030   pH 6.0 5.0 - 8.0   Glucose, UA NEGATIVE NEGATIVE mg/dL   Hgb urine dipstick MODERATE (A) NEGATIVE   Bilirubin Urine NEGATIVE NEGATIVE   Ketones, ur NEGATIVE NEGATIVE mg/dL   Protein, ur 956 (A) NEGATIVE mg/dL   Nitrite NEGATIVE NEGATIVE   Leukocytes, UA LARGE (A) NEGATIVE   RBC / HPF 11-20 0 - 5 RBC/hpf   WBC, UA >50 (H) 0 - 5 WBC/hpf   Bacteria, UA FEW (A) NONE SEEN   Squamous Epithelial / LPF 0-5 0 - 5   WBC Clumps PRESENT    Mucus PRESENT     Comment: Performed at [facility], [address] [place], [place] [address]  I-Stat CG4 Lactic Acid, ED     Status: None   Collection Time: 05/04/18  1:13 PM  Result Value Ref Range   Lactic Acid, Venous 0.65 0.5 - 1.9 mmol/L  Blood gas, arterial     Status: Abnormal   Collection Time: 05/04/18  2:49 PM  Result Value Ref Range   O2 Content 10.0 L/min   Delivery systems OXYGEN MASK    pH, Arterial 7.406 7.350 - 7.450   pCO2 arterial 38.3 32.0 - 48.0 mmHg   pO2, Arterial 118 (H) 83.0 - 108.0 mmHg   Bicarbonate 23.6 20.0 - 28.0 mmol/L   Acid-base deficit 0.4 0.0 - 2.0 mmol/L   O2 Saturation 98.1 %   Patient temperature 98.6    Collection site BRACHIAL ARTERY    Drawn by [id number]    Allens test (pass/fail) PASS PASS    Comment: Performed at [facility], [address] [place], [place] [address]   Dg Chest Port 1  View  Result Date: 05/04/2018 CLINICAL DATA:  Altered mental status beginning today. Abnormal oxygen saturation. EXAM: PORTABLE CHEST 1 VIEW COMPARISON:  04/08/2018 FINDINGS: Heart size is normal. Chronic aortic atherosclerosis. There is bronchopneumonia within the mid and lower lungs bilaterally. Upper lungs are clear. No visible effusion. No significant bone finding. IMPRESSION: Bilateral mid and lower lung bronchopneumonia. Electronically Signed   By: Paulina Fusi M.D.   On: 05/04/2018 11:42    Pending Labs Unresulted Labs (From admission, onward)    Start     Ordered   05/04/18 1058  Blood Culture (routine x 2)  BLOOD CULTURE X 2,   STAT     05/04/18 1057   05/04/18 1058  Urine culture  ONCE - STAT,   STAT     05/04/18 1057   Signed and Held  CBC  (enoxaparin (LOVENOX)    CrCl >/= 30 ml/min)  Once,   R    Comments:  Baseline for enoxaparin therapy IF NOT ALREADY DRAWN.  Notify MD  if PLT < 100 K.    Signed and Held   Signed and Held  Creatinine, serum  (enoxaparin (LOVENOX)    CrCl >/= 30 ml/min)  Once,   R    Comments:  Baseline for enoxaparin therapy IF NOT ALREADY DRAWN.    Signed and Held   Signed and Held  Creatinine, serum  (enoxaparin (LOVENOX)    CrCl >/= 30 ml/min)  Weekly,   R    Comments:  while on enoxaparin therapy    Signed and Held   Signed and Held  Basic metabolic panel  Tomorrow morning,   R     Signed and Held   Signed and Held  CBC  Tomorrow morning,   R     Signed and Held          Vitals/Pain Today's Vitals   05/04/18 1430 05/04/18 1500 05/04/18 1515 05/04/18 1530  BP: 96/67 102/62  100/63  Pulse: 91 91  89  Resp: 15 17  16   Temp:      TempSrc:      SpO2: 91% 99%  99%  Weight:      Height:      PainSc:   Asleep     Isolation Precautions No active isolations  Medications Medications  0.9 %  sodium chloride infusion ( Intravenous Rate/Dose Verify 05/04/18 1314)  azithromycin (ZITHROMAX) 500 mg in sodium chloride 0.9 % 250 mL IVPB (0 mg  Intravenous Stopped 05/04/18 1444)  Ampicillin-Sulbactam (UNASYN) 3 g in sodium chloride 0.9 % 100 mL IVPB (3 g Intravenous New Bag/Given 05/04/18 1601)  acetaminophen (TYLENOL) suppository 650 mg (650 mg Rectal Given 05/04/18 1143)  vancomycin (VANCOCIN) IVPB 1000 mg/200 mL premix (1,000 mg Intravenous New Bag/Given 05/04/18 1445)  ceFEPIme (MAXIPIME) 2 g in sodium chloride 0.9 % 100 mL IVPB (0 g Intravenous Stopped 05/04/18 1238)    Mobility non-ambulatory

## 2018-05-04 NOTE — Progress Notes (Signed)
Pharmacy Antibiotic Note  Jeremy Gill is a 72 y.o. male admitted on 05/04/2018 with sepsis due to aspiration PNA.  Pharmacy has been consulted for Unasyn dosing.  Plan: Unasyn 3 g IV q 8 hours.   Height: 5\' 10"  (177.8 cm) Weight: 158 lb 11.7 oz (72 kg) IBW/kg (Calculated) : 73  Temp (24hrs), Avg:100.4 F (38 C), Min:99.6 F (37.6 C), Max:101.1 F (38.4 C)  Recent Labs  Lab 05/04/18 1128 05/04/18 1135 05/04/18 1313  WBC 14.0*  --   --   CREATININE 2.10*  --   --   LATICACIDVEN  --  0.69 0.65    Estimated Creatinine Clearance: 32.4 mL/min (A) (by C-G formula based on SCr of 2.1 mg/dL (H)).    No Known Allergies  Antimicrobials this admission: 12/8  Cefepime/vancomycin x 1 12/8 azithromycin x 1 12/8 Unasyn >>  Dose adjustments this admission:   Microbiology results: 12/8 BCx:  12/8 UCx:    Thank you for allowing pharmacy to be a part of this patient's care.  Luisa HartChristy, [place] Noyce D 05/04/2018 3:30 PM

## 2018-05-04 NOTE — Progress Notes (Signed)
Pt. Not alert enough to perform MDI.

## 2018-05-04 NOTE — ED Notes (Signed)
RT made aware of ABG collection.

## 2018-05-04 NOTE — ED Notes (Signed)
Both sets of blood cultures collected prior to start of antibiotics. 

## 2018-05-04 NOTE — H&P (Signed)
History and Physical    CULVER FEIGHNER ZOX:[id number] DOB: [birth year] DOA: 05/04/2018  PCP: [place], [name], MD   Patient coming from: SNF  Chief Complaint: Dyspnea and hypoxemia.   HPI: CORWYN VORA is a 72 y.o. male with medical history significant of COPD, hypertension, type 2 diabetes mellitus, moderate to severe dementia, nonambulatory.  Patient was recently discharged to a skilled nursing facility November 19 for acute metabolic encephalopathy due to urinary tract infection, possible healthcare associated pneumonia and polypharmacy.  Apparently patient had been doing reasonably well at the skilled nursing facility, yesterday was an extraordinarily good day for him per his wife who is at bedside.  This morning his wife received a call about patient being severely hypoxic and being transferred to the hospital.  Patient has been unresponsive to pain stimuli, unable to get any further history.  Patient had a full neurologic work-up on last admission including electroencephalography and brain MRI.  Apparently he recovered back to his baseline.  Per his wife patient dementia has been rapidly declining, currently is moderate to severe in intensity.   ED Course: Patient was found ill looking appearing, hypoxic, placed on a nonrebreather, imaging showed bilateral lower lobe pneumonia, received IV antibiotic therapy and referred for admission for evaluation.  Patient is a DNR.  Review of Systems: Unable to obtain due to patient's encephalopathy.  All information from his wife at the bedside.   Past Medical History:  Diagnosis Date  . ANKLE SPRAIN 12/16/2006   Qualifier: Diagnosis of  By: Tawanna Cooler MD, Eugenio Hoes   . ANXIETY 10/25/2006  . CELLULITIS, LEG, RIGHT 09/08/2007   Qualifier: Diagnosis of  By: Tawanna Cooler MD, Eugenio Hoes   . Chronic pain disorder 11/30/2015  . COPD 10/25/2006  . Diabetes mellitus type 2 with neurological manifestations (HCC) 10/25/2006   Qualifier: Diagnosis of  By: Rosette Reveal RN,  Jorene Minors   . DIABETES MELLITUS, TYPE I 10/25/2006  . Diabetic polyneuropathy (HCC) 04/08/2007   Qualifier: Diagnosis of  By: Tawanna Cooler MD, Eugenio Hoes   . 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 Essential hypertension 10/25/2006   Qualifier: Diagnosis of  By: Rosette Reveal RN, Jorene Minors   . Generalized osteoarthritis of multiple sites 11/30/2015  . HYPERTENSION 10/25/2006  . 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 Low back pain with radiation 01/23/2016  . Polyneuropathy in diabetes(357.2) 04/08/2007  . Psoriasis   . TOBACCO ABUSE 07/08/2007  . VENOUS INSUFFICIENCY 11/25/2007    Past Surgical History:  Procedure Laterality Date  . TESTICLE REMOVAL     R testicle     reports that he has been smoking pipe. He has never used smokeless tobacco. He reports that he does not drink alcohol or use drugs.  No Known Allergies  Family History  Problem Relation Age of Onset  . Cancer Mother        Unknown   . Diabetes Sister      Prior to Admission medications   Medication Sig Start Date End Date Taking? Authorizing Provider  acetaminophen (TYLENOL) 325 MG tablet Take 650 mg by mouth every 6 (six) hours as needed.   Yes [provider]  amLODipine (NORVASC) 5 MG tablet Take 1 tablet (5 mg total) by mouth daily. 04/16/18  Yes Briant Cedar, MD  aspirin EC 81 MG tablet Take 1 tablet (81 mg total) by mouth daily. 04/03/18  Yes Margit Hanks, MD  baclofen (LIORESAL) 10 MG tablet  Take 0.5 tablets (5 mg total) by mouth 3 (three) times daily as needed for muscle spasms. 04/15/18  Yes Briant Cedar, MD  budesonide-formoterol [facility]) 160-4.5 MCG/ACT inhaler Inhale 2 puffs into the lungs 2 (two) times daily. 04/03/18  Yes Margit Hanks, MD  DULoxetine (CYMBALTA) 60 MG capsule TAKE 1 CAPSULE BY MOUTH EVERY DAY Patient taking differently: Take 60 mg by mouth daily.  04/03/18  Yes Margit Hanks, MD  folic acid (FOLVITE) 1 MG tablet  Take 1 tablet (1 mg total) by mouth daily. 04/03/18  Yes Margit Hanks, MD  gabapentin (NEURONTIN) 800 MG tablet Take 800 mg by mouth 3 (three) times daily.   Yes [provider]  insulin aspart (NOVOLOG) 100 UNIT/ML injection Inject 6 Units into the skin 3 (three) times daily with meals. 03/18/18  Yes Rodolph Bong, MD  Insulin Glargine (LANTUS SOLOSTAR) 100 UNIT/ML Solostar Pen Inject 28 Units into the skin at bedtime. Patient taking differently: Inject 20 Units into the skin at bedtime.  04/03/18  Yes Margit Hanks, MD  ipratropium-albuterol (DUONEB) 0.5-2.5 (3) MG/3ML SOLN Take 3 mLs by nebulization every 6 (six) hours as needed. Patient taking differently: Take 3 mLs by nebulization every 6 (six) hours as needed (wheezing).  04/03/18  Yes Margit Hanks, MD  isosorbide mononitrate (IMDUR) 30 MG 24 hr tablet Take 1 tablet (30 mg total) by mouth daily. 04/03/18  Yes Margit Hanks, MD  metoprolol tartrate (LOPRESSOR) 50 MG tablet Take 1 tablet (50 mg total) by mouth 2 (two) times daily. Patient taking differently: Take 25 mg by mouth daily.  04/03/18  Yes Margit Hanks, MD  polyethylene glycol [facility] Ethelene Hal) packet Take 17 g by mouth daily. Patient taking differently: Take 17 g by mouth daily as needed for mild constipation.  04/03/18  Yes Margit Hanks, MD  tamsulosin (FLOMAX) 0.4 MG CAPS capsule Take 1 capsule (0.4 mg total) by mouth daily after breakfast. NEED OV. 04/03/18  Yes Margit Hanks, MD  traMADol (ULTRAM) 50 MG tablet Take 0.5 tablets (25 mg total) by mouth every 8 (eight) hours as needed for up to 20 days for moderate pain. Patient taking differently: Take 50 mg by mouth every 8 (eight) hours as needed for moderate pain.  04/15/18 05/05/18 Yes Briant Cedar, MD  vitamin B-12 (CYANOCOBALAMIN) 1000 MCG tablet Take 1,000 mcg by mouth daily.   Yes [provider]  ACCU-CHEK SOFTCLIX LANCETS lancets Use as instructed 01/08/18   [place],  [name], MD  atorvastatin (LIPITOR) 40 MG tablet Take 1 tablet (40 mg total) by mouth daily. 04/03/18 07/02/18  Margit Hanks, MD  glucose blood (ONETOUCH VERIO) test strip Use to test blood sugar three-four times daily. 04/03/18   Margit Hanks, MD  Insulin Syringe-Needle U-100 (INSULIN SYRINGE .5CC/31GX5/16") 31G X 5/16" 0.5 ML MISC USE AS DIRECTED WITH LANTUS 04/03/18   Margit Hanks, MD    Physical Exam: Vitals:   05/04/18 1200 05/04/18 1230 05/04/18 1300 05/04/18 1307  BP: 113/69 106/65 101/60 101/60  Pulse: (!) 101 96 93 93  Resp: (!) 21 17 15 17   Temp:      TempSrc:      SpO2: 99% 97% 98% 98%  Weight:      Height:        Vitals:   05/04/18 1200 05/04/18 1230 05/04/18 1300 05/04/18 1307  BP: 113/69 106/65 101/60 101/60  Pulse: (!) 101 96 93 93  Resp: Marland Kitchen)  21 17 15 17   Temp:      TempSrc:      SpO2: 99% 97% 98% 98%  Weight:      Height:       General: deconditioned and ill looking appearing Neurology: patient is not responsive, to voice, touch or pain stimuli.  Head and Neck. Head normocephalic. Neck supple with no adenopathy or thyromegaly.   E ENT: positive pallor, no icterus, oral mucosa dry Cardiovascular: No JVD. S1-S2 present, rhythmic, no gallops, rubs, or murmurs. No lower extremity edema. Pulmonary: positive breath sounds bilaterally, poor air movement, no wheezing, positive diffuse bilateral rhonchi and scattered rales bilaterally. Gastrointestinal. Abdomen with no organomegaly, non tender, no rebound or guarding Skin. Reported pressure ulcers in his right heel and sacrum.  Musculoskeletal: no joint deformities    Labs on Admission: I have personally reviewed following labs and imaging studies  CBC: Recent Labs  Lab 05/04/18 1128  WBC 14.0*  NEUTROABS 10.9*  HGB 8.6*  HCT 27.2*  MCV 93.5  PLT 212   Basic Metabolic Panel: Recent Labs  Lab 05/04/18 1128  NA 139  K 3.4*  CL 103  CO2 23  GLUCOSE 58*  BUN 59*  CREATININE 2.10*  CALCIUM  8.7*   GFR: Estimated Creatinine Clearance: 32.4 mL/min (A) (by C-G formula based on SCr of 2.1 mg/dL (H)). Liver Function Tests: Recent Labs  Lab 05/04/18 1128  AST 85*  ALT 41  ALKPHOS 73  BILITOT 1.0  PROT 7.3  ALBUMIN 2.7*   No results for input(s): LIPASE, AMYLASE in the last 168 hours. No results for input(s): AMMONIA in the last 168 hours. Coagulation Profile: No results for input(s): INR, PROTIME in the last 168 hours. Cardiac Enzymes: No results for input(s): CKTOTAL, CKMB, CKMBINDEX, TROPONINI in the last 168 hours. BNP (last 3 results) No results for input(s): PROBNP in the last 8760 hours. HbA1C: No results for input(s): HGBA1C in the last 72 hours. CBG: No results for input(s): GLUCAP in the last 168 hours. Lipid Profile: No results for input(s): CHOL, HDL, LDLCALC, TRIG, CHOLHDL, LDLDIRECT in the last 72 hours. Thyroid Function Tests: No results for input(s): TSH, T4TOTAL, FREET4, T3FREE, THYROIDAB in the last 72 hours. Anemia Panel: No results for input(s): VITAMINB12, FOLATE, FERRITIN, TIBC, IRON, RETICCTPCT in the last 72 hours. Urine analysis:    Component Value Date/Time   COLORURINE AMBER (A) 05/04/2018 1156   APPEARANCEUR CLOUDY (A) 05/04/2018 1156   LABSPEC 1.017 05/04/2018 1156   PHURINE 6.0 05/04/2018 1156   GLUCOSEU NEGATIVE 05/04/2018 1156   GLUCOSEU NEGATIVE 09/30/2008 0851   HGBUR MODERATE (A) 05/04/2018 1156   HGBUR trace-lysed 03/29/2010 0940   BILIRUBINUR NEGATIVE 05/04/2018 1156   BILIRUBINUR neg 01/13/2014 1141   KETONESUR NEGATIVE 05/04/2018 1156   PROTEINUR 100 (A) 05/04/2018 1156   UROBILINOGEN 1.0 01/13/2014 1141   UROBILINOGEN 0.2 03/29/2010 0940   NITRITE NEGATIVE 05/04/2018 1156   LEUKOCYTESUR LARGE (A) 05/04/2018 1156    Radiological Exams on Admission: Dg Chest Port 1 View  Result Date: 05/04/2018 CLINICAL DATA:  Altered mental status beginning today. Abnormal oxygen saturation. EXAM: PORTABLE CHEST 1 VIEW COMPARISON:   04/08/2018 FINDINGS: Heart size is normal. Chronic aortic atherosclerosis. There is bronchopneumonia within the mid and lower lungs bilaterally. Upper lungs are clear. No visible effusion. No significant bone finding. IMPRESSION: Bilateral mid and lower lung bronchopneumonia. Electronically Signed   By: Paulina FusiMark  Shogry M.D.   On: 05/04/2018 11:42    EKG: Independently reviewed.  EKG  sinus tachycardia, 98 bpm, normal axis, normal intervals, ST depressions V4, V5 and V6, repolarization changes.  Assessment/Plan Active Problems:   Pneumonia  72 year old male with moderate to severe dementia, nonambulatory, recently discharged to a skilled nursing facility after being treated for metabolic encephalopathy and what looks like sepsis from pulmonary or urinary origin.  Patient developed acute hypoxic respiratory failure and worsening mentation, currently unresponsive.  On the initial physical examination his blood pressure has been 93/60, heart rate 92, respiratory rate 16, oxygen saturation 95%.  Dry mucous membranes, lungs with bilateral diffuse rhonchi and rails, no wheezing, abdomen soft nontender, no lower extremity edema, reported pressure ulcers right heel and sacrum.  Sodium 139, potassium 3.4, chloride 103, bicarb 23, glucose 58, BUN 59, creatinine 2.10, BNP 1114, white cell count 14.0, hemoglobin 8.6, hematocrit 27.2, platelets 212.  Urinalysis with greater than 50 white cells, 11-20 red cells, 100 protein.  Chest radiograph with bilateral patchy infiltrates predominantly at the right base.   Patient will be admitted to the hospital working diagnosis of sepsis due to aspiration pneumonia, bilateral lower lobes, complicated by severe metabolic encephalopathy and acute kidney injury.  1.  Sepsis due to bilateral aspiration pneumonia, lower lobes, complicated by acute hypoxic respiratory failure.  Patient will be admitted to the stepdown unit, continue oximetry monitoring and suplemmental oxygen per  nonrebreather, due to encephalopathy likely not candidate for noninvasive mechanical ventilation.  Will check arterial blood gas for signs of CO2 retention.  Has poor prognosis due to his dementia and acute encephalopathy, this was discussed with his wife at bedside, she is against invasive mechanical ventilation.  Continue aspiration precautions, hydration with isotonic saline and IV antibiotic therapy with Unasyn.  2.  Acute metabolic encephalopathy.   Severe symptoms in the setting of progressive dementia, aspiration precautions, neurochecks per unit protocol.  No aggressive measures.  His current Glasgow Coma Scale is down to 3  3.  Acute kidney injury on chronic kidney disease stage 3.  Sinew IV fluids with isotonic saline, follow-up kidney function in the morning, avoid hypotension nephrotoxic agents.  4.  Hypertension.  Will hold on antihypertensive agents, patient at home on amlodipine, isosorbide and metoprolol.   5.  Type 2 diabetes mellitus with hypoglycemia.  Will hold on basal insulin, continue glucose coverage and monitoring with insulin sliding scale.  Patient encephalopathic and no p.o. Intake.  6.  COPD.  No signs of exacerbation, continue bronchodilator therapy.  7.  Dyslipidemia.  Continue atorvastatin.  DVT prophylaxis: heparin sq Code Status: DNR  Family Communication: I spoke with patient's family at the bedside and all questions were addressed.   Disposition Plan: Stepdown unit  Consults called: none   Admission status: Inpatient.     Zebbie Ace Annett Gula MD [facility] Pager [phone]  If 7PM-7AM, please contact night-coverage [email or web address] Password [email or web address]  05/04/2018, 1:44 PM

## 2018-05-04 NOTE — ED Triage Notes (Signed)
Pt bib EMS and coming [facility] with altered mental status that was noticed this morning when staff attempted to wake up. Pt normally alert and oriented to his norm; able to help with his own care. Pt hx of dementia.  Pt had an O2 sat in the 70s upon [employer] arrival.  Pt placed on non-rebreather and his O2 is now 96% on arrival to ED.  EMS found pt's temp to be 103F.   Hx DM, UTI, chronic foley cath user.

## 2018-05-04 NOTE — Progress Notes (Signed)
Pt was revieved to 5E room 12 and nurse reviewed chart. Pt was to be sent to step down [facility] ER note in chart. MD notified and pt transferred to bed 1233. [facility] Pt on 100 per cent NRB. Foley cath. 2 PIV's Left and right arm.

## 2018-05-04 NOTE — ED Notes (Signed)
Bed: WA04 Expected date:  Expected time:  Means of arrival:  Comments: EMS/?sepsis? 

## 2018-05-04 NOTE — Progress Notes (Signed)
RN offered water to patient prior to giving PO medications. Immediate wet cough and signs of aspiration. Patient not given PO medications. MD notified.

## 2018-05-04 NOTE — Progress Notes (Signed)
A consult was received from an ED physician for Vancomycin & Cefepime per pharmacy dosing.  The patient's profile has been reviewed for ht/wt/allergies/indication/available labs.    A one time order has been placed for Vancomycin 1gm and Cefepime 2gm.  Further antibiotics/pharmacy consults should be ordered by admitting physician if indicated.                       Thank you,  Otho BellowsGreen, Mikalah Skyles L 05/04/2018  11:38 AM

## 2018-05-04 NOTE — ED Provider Notes (Signed)
[facility]-EMERGENCY DEPT Provider Note   CSN: [id number] Arrival date & time: 05/04/18  1043     History   Chief Complaint No chief complaint on file.   HPI Lazarus SalinesJames E Char is a 72 y.o. male.  [id number] year old male with history of dementia presents from nursing home with altered mental status times this morning.  Patient found to be febrile per EMS at 103.2.  He has had increasing cough and does have also have an indwelling Foley and has history of having prior UTIs.  No reported emesis or diarrhea.  No treatment of the fever prior to arrival.  No further history obtainable due to his current state     Past Medical History:  Diagnosis Date  . ANKLE SPRAIN 12/16/2006   Qualifier: Diagnosis of  By: Tawanna Coolerodd MD, Eugenio HoesJeffrey A   . ANXIETY 10/25/2006  . CELLULITIS, LEG, RIGHT 09/08/2007   Qualifier: Diagnosis of  By: Tawanna Coolerodd MD, Eugenio HoesJeffrey A   . Chronic pain disorder 11/30/2015  . COPD 10/25/2006  . 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 DIABETES MELLITUS, TYPE I 10/25/2006  . Diabetic polyneuropathy (HCC) 04/08/2007   Qualifier: Diagnosis of  By: Tawanna Coolerodd MD, Eugenio HoesJeffrey A   . 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 Essential hypertension 10/25/2006   Qualifier: Diagnosis of  By: Rosette Revealempleton, RN, Jorene MinorsAngela Dawn   . Generalized osteoarthritis of multiple sites 11/30/2015  . HYPERTENSION 10/25/2006  . 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 Low back pain with radiation 01/23/2016  . Polyneuropathy in diabetes(357.2) 04/08/2007  . Psoriasis   . TOBACCO ABUSE 07/08/2007  . VENOUS INSUFFICIENCY 11/25/2007    Patient Active Problem List   Diagnosis Date Noted  . 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 Stage I pressure ulcer of left heel 04/18/2018  . Stage II pressure ulcer of right heel (HCC) 04/18/2018  . Palliative care patient  04/15/2018  . UTI (urinary tract infection) 04/09/2018  . Community acquired pneumonia 04/03/2018  . NSTEMI (non-ST elevated myocardial infarction) (HCC) 03/25/2018  . Multi-infarct dementia (HCC) 03/25/2018  . Hypomagnesemia 03/25/2018  . Folate deficiency 03/16/2018  . Elevated troponin 03/14/2018  . Abnormal EKG 03/14/2018  . Chest pain 03/14/2018  . Oral candidiasis 03/14/2018  . Dysphagia 03/14/2018  . Malnutrition of moderate degree 03/14/2018  . HCAP (healthcare-associated pneumonia) 03/13/2018  . Abnormality of gait 02/27/2018  . Bilateral lower extremity edema 02/05/2018  . Pruritus of skin 02/05/2018  . Protein-calorie malnutrition, severe 01/16/2018  . Acute urinary retention 01/13/2018  . Acute encephalopathy 01/13/2018  . Obstipation 01/13/2018  . Chronic pain syndrome 11/01/2017  . Unstageable pressure ulcer of sacral region (HCC) 10/02/2017  . Anemia   . Depression 09/30/2017  . Acute renal failure superimposed on stage 3 chronic kidney disease (HCC) 09/30/2017  . Sepsis (HCC) 09/30/2017  . Hypokalemia 09/30/2017  . Hyperlipidemia associated with type 2 diabetes mellitus (HCC) 02/05/2017  . AKI (acute kidney injury) (HCC) 12/19/2016  . Acute metabolic encephalopathy 12/19/2016  . DKA (diabetic ketoacidoses) (HCC) 12/18/2016  . Hyperkalemia 12/18/2016  . Anxiety disorder, unspecified 02/08/2016  . Low back pain with radiation 01/23/2016  . Chronic pain disorder 11/30/2015  . Generalized osteoarthritis of multiple sites 11/30/2015  . DYSHIDROSIS 06/08/2008  . VENOUS INSUFFICIENCY 11/25/2007  . 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 TOBACCO ABUSE 07/08/2007  .  Diabetic polyneuropathy (HCC) 04/08/2007  . 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 Hypertension associated with diabetes (HCC) 10/25/2006  . COPD (chronic obstructive pulmonary disease) (HCC) 10/25/2006    Past Surgical History:  Procedure Laterality Date    . TESTICLE REMOVAL     R testicle        Home Medications    Prior to Admission medications   Medication Sig Start Date End Date Taking? Authorizing Provider  ACCU-CHEK SOFTCLIX LANCETS lancets Use as instructed 01/08/18   [place], [name], MD  acetaminophen (TYLENOL) 325 MG tablet Take 650 mg by mouth every 6 (six) hours as needed.    [provider]  amLODipine (NORVASC) 5 MG tablet Take 1 tablet (5 mg total) by mouth daily. 04/16/18   Briant Cedar, MD  aspirin EC 81 MG tablet Take 1 tablet (81 mg total) by mouth daily. 04/03/18   Margit Hanks, MD  atorvastatin (LIPITOR) 40 MG tablet Take 1 tablet (40 mg total) by mouth daily. 04/03/18 07/02/18  Margit Hanks, MD  baclofen (LIORESAL) 10 MG tablet Take 0.5 tablets (5 mg total) by mouth 3 (three) times daily as needed for muscle spasms. 04/15/18   Briant Cedar, MD  budesonide-formoterol (SYMBICORT) 160-4.5 MCG/ACT inhaler Inhale 2 puffs into the lungs 2 (two) times daily. 04/03/18   Margit Hanks, MD  DULoxetine (CYMBALTA) 60 MG capsule TAKE 1 CAPSULE BY MOUTH EVERY DAY Patient taking differently: Take 60 mg by mouth daily.  04/03/18   Margit Hanks, MD  folic acid (FOLVITE) 1 MG tablet Take 1 tablet (1 mg total) by mouth daily. 04/03/18   Margit Hanks, MD  gabapentin (NEURONTIN) 800 MG tablet Take 800 mg by mouth 3 (three) times daily.    [provider]  glucose blood (ONETOUCH VERIO) test strip Use to test blood sugar three-four times daily. 04/03/18   Margit Hanks, MD  insulin aspart (NOVOLOG) 100 UNIT/ML injection Inject 6 Units into the skin 3 (three) times daily with meals. 03/18/18   Rodolph Bong, MD  Insulin Glargine (LANTUS SOLOSTAR) 100 UNIT/ML Solostar Pen Inject 28 Units into the skin at bedtime. Patient taking differently: Inject 20 Units into the skin at bedtime.  04/03/18   Margit Hanks, MD  Insulin Syringe-Needle U-100 (INSULIN SYRINGE .5CC/31GX5/16") 31G X  5/16" 0.5 ML MISC USE AS DIRECTED WITH LANTUS 04/03/18   Margit Hanks, MD  ipratropium-albuterol (DUONEB) 0.5-2.5 (3) MG/3ML SOLN Take 3 mLs by nebulization every 6 (six) hours as needed. Patient taking differently: Take 3 mLs by nebulization every 6 (six) hours as needed (wheezing).  04/03/18   Margit Hanks, MD  isosorbide mononitrate (IMDUR) 30 MG 24 hr tablet Take 1 tablet (30 mg total) by mouth daily. 04/03/18   Margit Hanks, MD  metoprolol tartrate (LOPRESSOR) 50 MG tablet Take 1 tablet (50 mg total) by mouth 2 (two) times daily. Patient taking differently: Take 25 mg by mouth daily.  04/03/18   Margit Hanks, MD  NONFORMULARY OR COMPOUNDED ITEM Apply 1 application topically 2 (two) times daily as needed (rash). 1:1 mixture of eucerin and triamcinolone 0.1% ointment    [provider]  polyethylene glycol (MIRALAX / GLYCOLAX) packet Take 17 g by mouth daily. Patient taking differently: Take 17 g by mouth daily as needed for mild constipation.  04/03/18   Margit Hanks, MD  tamsulosin (FLOMAX) 0.4 MG CAPS capsule Take 1 capsule (0.4  mg total) by mouth daily after breakfast. NEED OV. 04/03/18   Margit Hanks, MD  traMADol (ULTRAM) 50 MG tablet Take 0.5 tablets (25 mg total) by mouth every 8 (eight) hours as needed for up to 20 days for moderate pain. 04/15/18 05/05/18  Briant Cedar, MD  vitamin B-12 (CYANOCOBALAMIN) 1000 MCG tablet Take 1,000 mcg by mouth daily.    [provider]    Family History Family History  Problem Relation Age of Onset  . Cancer Mother        Unknown   . Diabetes Sister     Social History Social History   Tobacco Use  . Smoking status: Current Every Day Smoker    Types: Pipe    Last attempt to quit: 05/29/2011    Years since quitting: 6.9  . Smokeless tobacco: Never Used  Substance Use Topics  . Alcohol use: No  . Drug use: No     Allergies   Patient has no known allergies.   Review of Systems Review of  Systems  Unable to perform ROS: Dementia     Physical Exam Updated Vital Signs There were no vitals taken for this visit.  Physical Exam  Constitutional: He appears well-developed and well-nourished. He appears lethargic.  Non-toxic appearance. No distress.  HENT:  Head: Normocephalic and atraumatic.  Eyes: Pupils are equal, round, and reactive to light. Conjunctivae, EOM and lids are normal.  Neck: Normal range of motion. Neck supple. No tracheal deviation present. No thyroid mass present.  Cardiovascular: Normal rate, regular rhythm and normal heart sounds. Exam reveals no gallop.  No murmur heard. Pulmonary/Chest: Effort normal and breath sounds normal. No stridor. No respiratory distress. He has no decreased breath sounds. He has no wheezes. He has no rhonchi. He has no rales.  Abdominal: Soft. Normal appearance and bowel sounds are normal. He exhibits no distension. There is no tenderness. There is no rebound and no CVA tenderness.  Musculoskeletal: Normal range of motion. He exhibits no edema or tenderness.  Neurological: He appears lethargic. He displays atrophy. GCS eye subscore is 1. GCS verbal subscore is 3. GCS motor subscore is 4.  Pt can't cooperate with exam  Skin: Skin is warm and dry. No abrasion and no rash noted.  Psychiatric: He is inattentive.  Nursing note and vitals reviewed.    ED Treatments / Results  Labs (all labs ordered are listed, but only abnormal results are displayed) Labs Reviewed  CULTURE, BLOOD (ROUTINE X 2)  CULTURE, BLOOD (ROUTINE X 2)  URINE CULTURE  COMPREHENSIVE METABOLIC PANEL  CBC WITH DIFFERENTIAL/PLATELET  URINALYSIS, ROUTINE W REFLEX MICROSCOPIC  I-STAT CG4 LACTIC ACID, ED    EKG EKG Interpretation  Date/Time:  Sunday May 04 2018 11:18:23 EST Ventricular Rate:  98 PR Interval:    QRS Duration: 111 QT Interval:  349 QTC Calculation: 446 R Axis:   68 Text Interpretation:  Sinus rhythm Consider left atrial enlargement  RSR' in V1 or V2, right VCD or RVH Borderline ST depression, lateral leads Confirmed by Lorre Nick ([id number]) on 05/04/2018 1:34:16 PM   Radiology No results found.  Procedures Procedures (including critical care time)  Medications Ordered in ED Medications  0.9 %  sodium chloride infusion (has no administration in time range)     Initial Impression / Assessment and Plan / ED Course  I have reviewed the triage vital signs and the nursing notes.  Pertinent labs & imaging results that were available during my care of the  patient were reviewed by me and considered in my medical decision making (see chart for details).     Patient with evidence of infection on arrival here with temperature.  Suspect pneumonia.  Empirically started on antibiotics.  Chest x-ray confirmed pneumonia.  Also has evidence of UTI.  Patient does have a valid DNR.  Will admit to the hospitalist service  CRITICAL CARE Performed by: Toy Baker Total critical care time: 45 minutes Critical care time was exclusive of separately billable procedures and treating other patients. Critical care was necessary to treat or prevent imminent or life-threatening deterioration. Critical care was time spent personally by me on the following activities: development of treatment plan with patient and/or surrogate as well as nursing, discussions with consultants, evaluation of patient's response to treatment, examination of patient, obtaining history from patient or surrogate, ordering and performing treatments and interventions, ordering and review of laboratory studies, ordering and review of radiographic studies, pulse oximetry and re-evaluation of patient's condition.   Final Clinical Impressions(s) / ED Diagnoses   Final diagnoses:  None    ED Discharge Orders    None       Lorre Nick, MD 05/04/18 1334

## 2018-05-05 DIAGNOSIS — J9601 Acute respiratory failure with hypoxia: Secondary | ICD-10-CM

## 2018-05-05 LAB — BASIC METABOLIC PANEL
Anion gap: 12 (ref 5–15)
BUN: 57 mg/dL — ABNORMAL HIGH (ref 8–23)
CALCIUM: 8.3 mg/dL — AB (ref 8.9–10.3)
CO2: 22 mmol/L (ref 22–32)
Chloride: 106 mmol/L (ref 98–111)
Creatinine, Ser: 1.84 mg/dL — ABNORMAL HIGH (ref 0.61–1.24)
GFR calc Af Amer: 42 mL/min — ABNORMAL LOW (ref >60)
GFR calc non Af Amer: 36 mL/min — ABNORMAL LOW (ref >60)
Glucose, Bld: 108 mg/dL — ABNORMAL HIGH (ref 70–99)
Potassium: 3.3 mmol/L — ABNORMAL LOW (ref 3.5–5.1)
SODIUM: 140 mmol/L (ref 135–145)

## 2018-05-05 LAB — CBC
HCT: 29 % — ABNORMAL LOW (ref 39.0–52.0)
Hemoglobin: 9 g/dL — ABNORMAL LOW (ref 13.0–17.0)
MCH: 29.4 pg (ref 26.0–34.0)
MCHC: 31 g/dL (ref 30.0–36.0)
MCV: 94.8 fL (ref 80.0–100.0)
PLATELETS: 194 10*3/uL (ref 150–400)
RBC: 3.06 MIL/uL — ABNORMAL LOW (ref 4.22–5.81)
RDW: 14.7 % (ref 11.5–15.5)
WBC: 17.8 10*3/uL — ABNORMAL HIGH (ref 4.0–10.5)
nRBC: 0 % (ref 0.0–0.2)

## 2018-05-05 LAB — GLUCOSE, CAPILLARY
GLUCOSE-CAPILLARY: 361 mg/dL — AB (ref 70–99)
Glucose-Capillary: 100 mg/dL — ABNORMAL HIGH (ref 70–99)
Glucose-Capillary: 103 mg/dL — ABNORMAL HIGH (ref 70–99)
Glucose-Capillary: 144 mg/dL — ABNORMAL HIGH (ref 70–99)
Glucose-Capillary: 381 mg/dL — ABNORMAL HIGH (ref 70–99)

## 2018-05-05 LAB — URINE CULTURE: Culture: <10000 — AB

## 2018-05-05 MED ORDER — JUVEN PO PACK
1.0000 | PACK | Freq: Two times a day (BID) | ORAL | Status: DC
  Administered 2018-05-05 – 2018-05-09 (×6): 1 via ORAL
  Filled 2018-05-05 (×9): qty 1

## 2018-05-05 MED ORDER — ENSURE ENLIVE PO LIQD
237.0000 mL | Freq: Two times a day (BID) | ORAL | Status: DC
  Administered 2018-05-05 (×2): 237 mL via ORAL

## 2018-05-05 MED ORDER — ADULT MULTIVITAMIN W/MINERALS CH
1.0000 | ORAL_TABLET | Freq: Every day | ORAL | Status: DC
  Administered 2018-05-05 – 2018-05-06 (×2): 1 via ORAL
  Filled 2018-05-05 (×2): qty 1

## 2018-05-05 MED ORDER — POTASSIUM CHLORIDE CRYS ER 20 MEQ PO TBCR
40.0000 meq | EXTENDED_RELEASE_TABLET | Freq: Once | ORAL | Status: AC
  Administered 2018-05-05: 40 meq via ORAL
  Filled 2018-05-05: qty 2

## 2018-05-05 NOTE — Progress Notes (Signed)
Initial Nutrition Assessment  DOCUMENTATION CODES:   Severe malnutrition in context of acute illness/injury, Non-severe (moderate) malnutrition in context of chronic illness  INTERVENTION:  - Will order Ensure Enlive BID, each supplement provides 350 kcal and 20 grams of protein. - Will order Juven BID, each packet provides 80 calories and 14 grams of amino acids; supplement contains CaHMB, glutamine, and arginine, to promote wound healing. - Will order daily multivitamin with minerals. - Continue to encourage PO intakes.    NUTRITION DIAGNOSIS:   Severe Malnutrition related to acute illness, chronic illness as evidenced by moderate fat depletion, moderate muscle depletion, severe muscle depletion.  GOAL:   Patient will meet greater than or equal to 90% of their needs  MONITOR:   PO intake, Supplement acceptance, Weight trends, Labs, Skin  REASON FOR ASSESSMENT:   Other (Comment)(Pressure Injury report)  ASSESSMENT:   72 year old male with history of COPD, HTN, type 2 DM, moderate to severe dementia, non-ambulatory, multiple recent admission. He was admitted to SNF after hospitalization in mid-November. He presented to the ED on 12/8 with dyspnea and hypoxemia.  He was placed on a nonrebreather, imaging showed bilateral lower lobe pneumonia and he was started on IV antibiotics.  BMI indicates normal weight. Patient seen by this RD on 03/14/18 and NFPE from that date today has shown progression of muscle and fat depletion. Patient noted to be a/o to self only but was able to indicate that he is not experiencing any abdominal pain or nausea and that he was feeling thirsty.   Family, who is at beside, provides information. At the facility patient was eating 3 meals/day and family was always present for lunch meal. During that meal patient always ate very well. He did not have any chewing or swallowing issues while at the facility. He was provided protein shakes, such as Ensure, while  he was there, and family is interested in him receiving them during hospitalization.  Per chart review, current weight is 159 lb and it appears that weight has been stable over the past 3 months. Patient seen by SLP this AM at which time diet was advanced from NPO to Dysphagia 3, Carb Modified, thin liquids. Will need to ask at follow-up if patient is able to self-feed or needs feeding assistance.    Medications reviewed; 1 mg Folvite/day, sliding scale Novolog, 40 mEq K-Dur x1 dose 12/9. Labs reviewed; CBG: 100 mg/dL, K: 3.3 mmol/L, BUN: 57 mg/dL, creatinine: 9.60 mg/dL, Ca: 8.3 mg/dL, GFR: 36 mL/min. IVF; NS @ 75 mL/hr.      NUTRITION - FOCUSED PHYSICAL EXAM:    Most Recent Value  Orbital Region  Mild depletion  Upper Arm Region  Moderate depletion  Thoracic and Lumbar Region  Unable to assess  Buccal Region  Moderate depletion  Temple Region  Severe depletion  Clavicle Bone Region  Moderate depletion  Clavicle and Acromion Bone Region  Severe depletion  Scapular Bone Region  Unable to assess  Dorsal Hand  Moderate depletion  Patellar Region  Moderate depletion  Anterior Thigh Region  Unable to assess  Posterior Calf Region  Unable to assess  Edema (RD Assessment)  Unable to assess  Hair  Reviewed  Eyes  Reviewed  Mouth  Reviewed  Skin  Reviewed  Nails  Reviewed       Diet Order:   Diet Order            DIET DYS 3 Room service appropriate? Yes; Fluid consistency: Thin  Diet effective now  EDUCATION NEEDS:   Not appropriate for education at this time  Skin:  Skin Integrity Issues:: Stage I, Stage II, Unstageable Stage I: L heel Stage II: buttocks and R heel Unstageable: full thickness to coccyx  Last BM:  12/8  Height:   Ht Readings from Last 1 Encounters:  05/04/18 5\' 10"  (1.778 m)    Weight:   Wt Readings from Last 1 Encounters:  05/04/18 72 kg    Ideal Body Weight:  75.45 kg  BMI:  Body mass index is 22.78 kg/m.  Estimated  Nutritional Needs:   Kcal:  2015-2305 kcal  Protein:  110-122 grams  Fluid:  >/= 2 L/day     Trenton GammonJessica Aleksei Goodlin, MS, RD, LDN, [facility] Inpatient Clinical Dietitian Pager # [phone] After hours/weekend pager # [phone]

## 2018-05-05 NOTE — Progress Notes (Signed)
Patient ID: Jeremy Gill, male   DOB: 02-15-1946, 72 y.o.   MRN: [id number]  PROGRESS NOTE    Jeremy Gill  WUJ:[id number] DOB: Oct 03, 1945 DOA: 05/04/2018 PCP: [place], [name], MD   Brief Narrative:  72 year old male with history of COPD, hypertension, diabetes mellitus type 2, moderate to severe dementia, nonambulatory, multiple recent admissions to the hospital including recent admission and discharge to skilled nursing facility on April 15, 2018 for acute metabolic encephalopathy due to urinary tract infection with possible pneumonia and polypharmacy presented on 05/04/2018 with dyspnea and hypoxemia.  He was placed on a nonrebreather, imaging showed bilateral lower lobe pneumonia.  He was started on intravenous antibiotics.   Assessment & Plan:   Active Problems:   Sepsis (HCC)   Pneumonia   Sepsis secondary to pneumonia -Hemodynamically improving.  Continue antibiotics.  Follow cultures  Bilateral lower lobe probable aspiration pneumonia -Continue Unasyn.  Follow cultures.  SLP evaluation.  Aspiration precautions  Acute hypoxic respiratory failure -Probably secondary to above.  Initially required nonrebreather.  Currently on 6 L nasal cannula. -Wean off as able -Incentive spirometry  Acute metabolic encephalopathy in a patient with moderate to severe dementia -Probably secondary to above.  More awake this morning.  Continue mental status monitoring.  Fall precautions.  Acute kidney injury on chronic kidney disease stage III -Creatinine improving  Hypokalemia -Replace.  Repeat a.m. labs  Leukocytosis -Worsening.  Monitor  Hypertension -Monitor blood pressure.  Currently antihypertensives are on hold  Diabetes mellitus type 2 with hypoglycemia -Basal insulin on hold.  Continue Accu-Cheks.  Encourage patient's oral intake  COPD -No signs of exacerbation.  Continue bronchodilator therapy  Generalized deconditioning -Overall prognosis is guarded to poor.   Patient has had multiple hospitalizations recently and mental status is progressively getting worse.  If condition does not improve, consider hospice/comfort measures  Dyslipidemia -Continue atorvastatin  DVT prophylaxis: Heparin Code Status: DNR Family Communication: None at bedside Disposition Plan: Depends on clinical outcome  Consultants: None  Procedures: None  Antimicrobials: Unasyn from 05/04/2018 onwards 1 dose of cefepime, Zithromax and vancomycin on 05/04/2018  Subjective: Patient seen and examined at bedside.  He is awake and pleasantly confused.  Denies worsening shortness of breath or cough.  No overnight fever or vomiting reported  Objective: Vitals:   05/05/18 0700 05/05/18 0800 05/05/18 0900 05/05/18 1000  BP: (!) 107/54 (!) 141/78 124/61 (!) 108/46  Pulse: 99 (!) 103 (!) 104 (!) 107  Resp: 15 16 18 18   Temp:  98.6 F (37 C)    TempSrc:  Oral    SpO2: 99% 98% 98% 96%  Weight:      Height:        Intake/Output Summary (Last 24 hours) at 05/05/2018 1051 Last data filed at 05/05/2018 1011 Gross per 24 hour  Intake 2268.51 ml  Output 1325 ml  Net 943.51 ml   Filed Weights   05/04/18 1113  Weight: 72 kg    Examination:  General exam: Elderly male lying in bed.  No distress.  Confused.  Looks chronically ill Respiratory system: Bilateral decreased breath sounds at bases, scattered crackles Cardiovascular system: S1 & S2 heard, Rate controlled Gastrointestinal system: Abdomen is nondistended, soft and nontender. Normal bowel sounds heard. Extremities: No cyanosis, clubbing, edema  Central nervous system: Awake, answers some questions but is confused.  No focal neurological deficits. Moving extremities Skin: No rashes, lesions or ulcers Psychiatry: Could not be assessed because of mental status.    Data Reviewed: I  have personally reviewed following labs and imaging studies  CBC: Recent Labs  Lab 05/04/18 1128 05/05/18 0330  WBC 14.0* 17.8*    NEUTROABS 10.9*  --   HGB 8.6* 9.0*  HCT 27.2* 29.0*  MCV 93.5 94.8  PLT 212 194   Basic Metabolic Panel: Recent Labs  Lab 05/04/18 1128 05/05/18 0330  NA 139 140  K 3.4* 3.3*  CL 103 106  CO2 23 22  GLUCOSE 58* 108*  BUN 59* 57*  CREATININE 2.10* 1.84*  CALCIUM 8.7* 8.3*   GFR: Estimated Creatinine Clearance: 37 mL/min (A) (by C-G formula based on SCr of 1.84 mg/dL (H)). Liver Function Tests: Recent Labs  Lab 05/04/18 1128  AST 85*  ALT 41  ALKPHOS 73  BILITOT 1.0  PROT 7.3  ALBUMIN 2.7*   No results for input(s): LIPASE, AMYLASE in the last 168 hours. No results for input(s): AMMONIA in the last 168 hours. Coagulation Profile: No results for input(s): INR, PROTIME in the last 168 hours. Cardiac Enzymes: No results for input(s): CKTOTAL, CKMB, CKMBINDEX, TROPONINI in the last 168 hours. BNP (last 3 results) No results for input(s): PROBNP in the last 8760 hours. HbA1C: No results for input(s): HGBA1C in the last 72 hours. CBG: Recent Labs  Lab 05/04/18 2159 05/04/18 2250 05/05/18 0334  GLUCAP 61* 106* 100*   Lipid Profile: No results for input(s): CHOL, HDL, LDLCALC, TRIG, CHOLHDL, LDLDIRECT in the last 72 hours. Thyroid Function Tests: No results for input(s): TSH, T4TOTAL, FREET4, T3FREE, THYROIDAB in the last 72 hours. Anemia Panel: No results for input(s): VITAMINB12, FOLATE, FERRITIN, TIBC, IRON, RETICCTPCT in the last 72 hours. Sepsis Labs: Recent Labs  Lab 05/04/18 1135 05/04/18 1313  LATICACIDVEN 0.69 0.65    Recent Results (from the past 240 hour(s))  MRSA PCR Screening     Status: None   Collection Time: 05/04/18  5:56 PM  Result Value Ref Range Status   MRSA by PCR NEGATIVE NEGATIVE Final    Comment:        The GeneXpert MRSA Assay (FDA approved for NASAL specimens only), is one component of a comprehensive MRSA colonization surveillance program. It is not intended to diagnose MRSA infection nor to guide or monitor treatment  for MRSA infections. Performed at [facility], [address], [place], [place] [address]          Radiology Studies: Dg Chest Port 1 View  Result Date: 05/04/2018 CLINICAL DATA:  Altered mental status beginning today. Abnormal oxygen saturation. EXAM: PORTABLE CHEST 1 VIEW COMPARISON:  04/08/2018 FINDINGS: Heart size is normal. Chronic aortic atherosclerosis. There is bronchopneumonia within the mid and lower lungs bilaterally. Upper lungs are clear. No visible effusion. No significant bone finding. IMPRESSION: Bilateral mid and lower lung bronchopneumonia. Electronically Signed   By: Paulina Fusi M.D.   On: 05/04/2018 11:42        Scheduled Meds: . aspirin EC  81 mg Oral Daily  . atorvastatin  40 mg Oral Daily  . DULoxetine  60 mg Oral Daily  . folic acid  1 mg Oral Daily  . gabapentin  800 mg Oral TID  . insulin aspart  0-9 Units Subcutaneous TID WC  . mometasone-formoterol  2 puff Inhalation BID  . tamsulosin  0.4 mg Oral QPC breakfast   Continuous Infusions: . sodium chloride 75 mL/hr at 05/05/18 0820  . ampicillin-sulbactam (UNASYN) IV Stopped (05/05/18 0013)     LOS: 1 day  Glade LloydKshitiz Domanique Huesman, MD [facility] Pager [phone]  If 7PM-7AM, please contact night-coverage [email or web address] Password [email or web address] 05/05/2018, 10:51 AM

## 2018-05-05 NOTE — Consult Note (Addendum)
   [facility]   05/05/2018  Jeremy SalinesJames E Gill Jul 12, 1945 [id number]    [facility]. [facility]. [facility] Post Acute Coordinator has outre[facility] patient's [facility]  Chart reviewed. Jeremy Gill currently on stepdown unit with transfer orders for med-surg. Also noted palliative consult pending.   Will continue to follow along for disposition plans and progression. Made inpatient RNCM aware [facility] is following.   Raiford NobleAtika Kena Limon, MSN-Ed, RN,BSN [facility] Hospital Liaison [phone]

## 2018-05-05 NOTE — Progress Notes (Signed)
Patient was transferred from ICU at 1842. Alert and oriented x 1 (to person). No pain complained. Vital signs was taken. Call light is in patient reach. Family is notified. RN will check HR in 30 minutes.

## 2018-05-05 NOTE — Evaluation (Signed)
Clinical/Bedside Swallow Evaluation Patient Details  Name: Jeremy Gill MRN: [id number] Date of Birth: 04-Apr-1946  Today's Date: 05/05/2018 Time: SLP Start Time (ACUTE ONLY): 0945 SLP Stop Time (ACUTE ONLY): 1020 SLP Time Calculation (min) (ACUTE ONLY): 35 min  Past Medical History:  Past Medical History:  Diagnosis Date  . ANKLE SPRAIN 12/16/2006   Qualifier: Diagnosis of  By: Tawanna Cooler MD, Eugenio Hoes   . ANXIETY 10/25/2006  . CELLULITIS, LEG, RIGHT 09/08/2007   Qualifier: Diagnosis of  By: Tawanna Cooler MD, Eugenio Hoes   . Chronic pain disorder 11/30/2015  . COPD 10/25/2006  . 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 DIABETES MELLITUS, TYPE I 10/25/2006  . Diabetic polyneuropathy (HCC) 04/08/2007   Qualifier: Diagnosis of  By: Tawanna Cooler MD, Eugenio Hoes   . 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 Essential hypertension 10/25/2006   Qualifier: Diagnosis of  By: Rosette Reveal RN, Jorene Minors   . Generalized osteoarthritis of multiple sites 11/30/2015  . HYPERTENSION 10/25/2006  . 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 Low back pain with radiation 01/23/2016  . Polyneuropathy in diabetes(357.2) 04/08/2007  . Psoriasis   . TOBACCO ABUSE 07/08/2007  . VENOUS INSUFFICIENCY 11/25/2007   Past Surgical History:  Past Surgical History:  Procedure Laterality Date  . TESTICLE REMOVAL     R testicle   HPI:  72 yo male adm to [facility] with AMS, respiratory difficulties - noted to have bilateral pna and required NRB - Today he is alert and talking and helping to feed self.  Pt also PMH + for HTN, DM, prior smoker, bedridden, dementia, recent pnas with hospitalization x4 in six months and rehab stay, chronic back pain.  Concern noted for possible excessive pharmaceutical provided - pt with chronic pain.  Swallow evaluation ordered.  Pt has MBS during past hospitalization 02/2018 revealing  moderately severe dysphagia with sensorimotor deficits with recommendation for soft/nectar diet and follow up.  Per family, pt has been on a regular/thin diet at SNF and tolerating.  Per notes in chart, pt is not for aggressive measures.  SLP advised pt have a palliative consult during prior admission.     Assessment / Plan / Recommendation Clinical Impression  Pt's today are consistent with prior evaluation results.   Suspect cognitive and motor planning deficits impacting swallow again.  Pt with difficulties following directions, ? some oral motor planning difficulties.  Extensive oral care today needed due to dried secretions retained in oral cavity- oral breather with dryness.  Today pt with weak subtle cough after intake of water - but no indications of aspiration today with intake of pudding, graham cracker.  Pt self fed with assistance.  He is slow to swallow and orally manipulate.  Throat clearing noted with liquids which he consumes in sequential boluses.  Family reports after Oct MBS, pt would not consume nectar liquids.  Recommend pt initiiate a dys3/thin diet - with preference of water consumption - family agreeable to plan.  Advised pt/family to recommendations to mitigate aspiration/dysphagia and advised suspicion for episodic aspiration - ? if pt's AMS s/p medications could have contributed to pna.  Family agreeable to plan.   Again recommend a palliative referral given pt multiple admits in the last six months.  Skilled intervention included educating pt/family to prior MBS, aspiration precautions.  Will follow up for dysphagia management.  At this  time, do not recommend MBS, as do not anticipate will change pt's outcomes.  SLP Visit Diagnosis: Dysphagia, oropharyngeal phase (R13.12)    Aspiration Risk  Moderate aspiration risk    Diet Recommendation Dysphagia 3 (Mech soft);Thin liquid   Medication Administration: Whole meds with puree Supervision: Full supervision/cueing for  compensatory strategies Compensations: Minimize environmental distractions;Slow rate;Small sips/bites;Follow solids with liquid    Other  Recommendations Oral Care Recommendations: Oral care BID   Follow up Recommendations (tbd)      Frequency and Duration min 1 x/week  1 week       Prognosis Prognosis for Safe Diet Advancement: Fair Barriers to Reach Goals: Time post onset;Cognitive deficits      Swallow Study   General Date of Onset: 05/05/18 HPI: 72 yo male adm to [facility] with AMS, respiratory difficulties - noted to have bilateral pna and required NRB - Today he is alert and talking and helping to feed self.  Pt also PMH + for HTN, DM, prior smoker, bedridden, dementia, recent pnas with hospitalization x4 in six months and rehab stay, chronic back pain.  Concern noted for possible excessive pharmaceutical provided - pt with chronic pain.  Swallow evaluation ordered.  Pt has MBS during past hospitalization 02/2018 revealing moderately severe dysphagia with sensorimotor deficits with recommendation for soft/nectar diet and follow up.  Per family, pt has been on a regular/thin diet at SNF and tolerating.  Per notes in chart, pt is not for aggressive measures.  SLP advised pt have a palliative consult during prior admission.   Type of Study: Bedside Swallow Evaluation Diet Prior to this Study: NPO Temperature Spikes Noted: No Respiratory Status: Nasal cannula History of Recent Intubation: No Behavior/Cognition: Alert;Cooperative;Pleasant mood Oral Cavity Assessment: Dry;Dried secretions Oral Care Completed by SLP: Yes Oral Cavity - Dentition: Poor condition Self-Feeding Abilities: Needs assist Patient Positioning: Upright in bed Baseline Vocal Quality: Normal Volitional Cough: Weak Volitional Swallow: Unable to elicit    Oral/Motor/Sensory Function Overall Oral Motor/Sensory Function: Generalized oral weakness Facial ROM: Reduced left;Suspected CN VII (facial) dysfunction(OLD from  prior exams)   Ice Chips Ice chips: Within functional limits Presentation: Spoon   Thin Liquid Thin Liquid: Impaired Pharyngeal  Phase Impairments: Cough - Delayed(subtle cough)    Nectar Thick Nectar Thick Liquid: Not tested   Honey Thick Honey Thick Liquid: Not tested   Puree Puree: Within functional limits Presentation: Self Fed;Spoon Other Comments: minimal delay in oral transiting suspected   Solid     Solid: Impaired Presentation: Self Fed Oral Phase Impairments: Reduced lingual movement/coordination;Impaired mastication Oral Phase Functional Implications: Impaired mastication Pharyngeal Phase Impairments: Suspected delayed Swallow      Chales AbrahamsKimball, Demarr Kluever Ann 05/05/2018,11:27 AM  Donavan Burnetamara Brindy Higginbotham, MS [facility] SLP Acute Rehab Services Pager [phone] Office [phone]

## 2018-05-06 DIAGNOSIS — D72829 Elevated white blood cell count, unspecified: Secondary | ICD-10-CM

## 2018-05-06 DIAGNOSIS — F0390 Unspecified dementia without behavioral disturbance: Secondary | ICD-10-CM

## 2018-05-06 LAB — CBC WITH DIFFERENTIAL/PLATELET
Abs Immature Granulocytes: 0.08 10*3/uL — ABNORMAL HIGH (ref 0.00–0.07)
BASOS ABS: 0 10*3/uL (ref 0.0–0.1)
Basophils Relative: 0 %
Eosinophils Absolute: 0 10*3/uL (ref 0.0–0.5)
Eosinophils Relative: 0 %
HEMATOCRIT: 28.6 % — AB (ref 39.0–52.0)
Hemoglobin: 9 g/dL — ABNORMAL LOW (ref 13.0–17.0)
Immature Granulocytes: 1 %
Lymphocytes Relative: 9 %
Lymphs Abs: 1.6 10*3/uL (ref 0.7–4.0)
MCH: 29 pg (ref 26.0–34.0)
MCHC: 31.5 g/dL (ref 30.0–36.0)
MCV: 92.3 fL (ref 80.0–100.0)
Monocytes Absolute: 0.6 10*3/uL (ref 0.1–1.0)
Monocytes Relative: 3 %
Neutro Abs: 15.4 10*3/uL — ABNORMAL HIGH (ref 1.7–7.7)
Neutrophils Relative %: 87 %
Platelets: 238 10*3/uL (ref 150–400)
RBC: 3.1 MIL/uL — ABNORMAL LOW (ref 4.22–5.81)
RDW: 14.7 % (ref 11.5–15.5)
WBC: 17.7 10*3/uL — ABNORMAL HIGH (ref 4.0–10.5)
nRBC: 0 % (ref 0.0–0.2)

## 2018-05-06 LAB — BASIC METABOLIC PANEL
Anion gap: 12 (ref 5–15)
BUN: 48 mg/dL — ABNORMAL HIGH (ref 8–23)
CO2: 19 mmol/L — ABNORMAL LOW (ref 22–32)
Calcium: 8.5 mg/dL — ABNORMAL LOW (ref 8.9–10.3)
Chloride: 107 mmol/L (ref 98–111)
Creatinine, Ser: 1.55 mg/dL — ABNORMAL HIGH (ref 0.61–1.24)
GFR calc Af Amer: 51 mL/min — ABNORMAL LOW (ref >60)
GFR calc non Af Amer: 44 mL/min — ABNORMAL LOW (ref >60)
Glucose, Bld: 421 mg/dL — ABNORMAL HIGH (ref 70–99)
Potassium: 3.5 mmol/L (ref 3.5–5.1)
Sodium: 138 mmol/L (ref 135–145)

## 2018-05-06 LAB — COMPREHENSIVE METABOLIC PANEL
ALBUMIN: 2.2 g/dL — AB (ref 3.5–5.0)
ALT: 74 U/L — ABNORMAL HIGH (ref 0–44)
AST: 93 U/L — ABNORMAL HIGH (ref 15–41)
Alkaline Phosphatase: 68 U/L (ref 38–126)
Anion gap: 14 (ref 5–15)
BUN: 57 mg/dL — ABNORMAL HIGH (ref 8–23)
CO2: 19 mmol/L — ABNORMAL LOW (ref 22–32)
Calcium: 8.5 mg/dL — ABNORMAL LOW (ref 8.9–10.3)
Chloride: 106 mmol/L (ref 98–111)
Creatinine, Ser: 1.72 mg/dL — ABNORMAL HIGH (ref 0.61–1.24)
GFR calc Af Amer: 45 mL/min — ABNORMAL LOW (ref >60)
GFR calc non Af Amer: 39 mL/min — ABNORMAL LOW (ref >60)
GLUCOSE: 500 mg/dL — AB (ref 70–99)
Potassium: 3.3 mmol/L — ABNORMAL LOW (ref 3.5–5.1)
Sodium: 139 mmol/L (ref 135–145)
Total Bilirubin: 1.2 mg/dL (ref 0.3–1.2)
Total Protein: 6.5 g/dL (ref 6.5–8.1)

## 2018-05-06 LAB — GLUCOSE, CAPILLARY
Glucose-Capillary: 512 mg/dL (ref 70–99)
Glucose-Capillary: 339 mg/dL — ABNORMAL HIGH (ref 70–99)
Glucose-Capillary: 129 mg/dL — ABNORMAL HIGH (ref 70–99)
Glucose-Capillary: 425 mg/dL — ABNORMAL HIGH (ref 70–99)

## 2018-05-06 LAB — GLUCOSE, RANDOM: Glucose, Bld: 500 mg/dL — ABNORMAL HIGH (ref 70–99)

## 2018-05-06 LAB — MAGNESIUM: Magnesium: 2 mg/dL (ref 1.7–2.4)

## 2018-05-06 MED ORDER — INSULIN ASPART 100 UNIT/ML ~~LOC~~ SOLN
20.0000 [IU] | Freq: Once | SUBCUTANEOUS | Status: AC
  Administered 2018-05-06: 20 [IU] via SUBCUTANEOUS

## 2018-05-06 MED ORDER — INSULIN ASPART 100 UNIT/ML ~~LOC~~ SOLN: 0.0000 [IU] | Freq: Every day | SUBCUTANEOUS | Status: DC

## 2018-05-06 MED ORDER — INSULIN ASPART 100 UNIT/ML ~~LOC~~ SOLN
6.0000 [IU] | Freq: Three times a day (TID) | SUBCUTANEOUS | Status: DC
  Administered 2018-05-07: 6 [IU] via SUBCUTANEOUS

## 2018-05-06 MED ORDER — INSULIN ASPART 100 UNIT/ML ~~LOC~~ SOLN
0.0000 [IU] | Freq: Three times a day (TID) | SUBCUTANEOUS | Status: DC
  Administered 2018-05-06: 2 [IU] via SUBCUTANEOUS
  Administered 2018-05-06: 11 [IU] via SUBCUTANEOUS
  Administered 2018-05-07: 5 [IU] via SUBCUTANEOUS
  Administered 2018-05-07: 8 [IU] via SUBCUTANEOUS
  Administered 2018-05-07: 3 [IU] via SUBCUTANEOUS
  Administered 2018-05-08: 2 [IU] via SUBCUTANEOUS
  Administered 2018-05-08: 3 [IU] via SUBCUTANEOUS
  Administered 2018-05-08: 5 [IU] via SUBCUTANEOUS

## 2018-05-06 MED ORDER — INSULIN GLARGINE 100 UNIT/ML ~~LOC~~ SOLN
20.0000 [IU] | Freq: Every day | SUBCUTANEOUS | Status: DC
  Administered 2018-05-06: 20 [IU] via SUBCUTANEOUS
  Filled 2018-05-06: qty 0.2

## 2018-05-06 MED ORDER — POTASSIUM CHLORIDE CRYS ER 20 MEQ PO TBCR
40.0000 meq | EXTENDED_RELEASE_TABLET | Freq: Once | ORAL | Status: AC
  Administered 2018-05-06: 40 meq via ORAL
  Filled 2018-05-06: qty 2

## 2018-05-06 NOTE — Progress Notes (Signed)
PT Cancellation Note  Patient Details Name: Jeremy Gill MRN: [id number] DOB: 1946/04/17   Cancelled Treatment:    Reason Eval/Treat Not Completed: Fatigue/lethargy limiting ability to participate(per family pt was awake during the night and is now sleeping soundly, they requested PT attempt tomorrow.  Will follow. )   Tamala SerUhlenberg, Lacrystal Barbe Kistler PT 05/06/2018  Acute Rehabilitation Services Pager [phone] Office [phone]

## 2018-05-06 NOTE — Progress Notes (Signed)
Blood glucose was 512 this am. MD paged for orders.  Awaiting response

## 2018-05-06 NOTE — Progress Notes (Signed)
Inpatient Diabetes Program Recommendations  AACE/ADA: New Consensus Statement on Inpatient Glycemic Control (2015)  Target Ranges:  Prepandial:   less than 140 mg/dL      Peak postprandial:   less than 180 mg/dL (1-2 hours)      Critically ill patients:  140 - 180 mg/dL   Lab Results  Component Value Date   GLUCAP 339 (H) 05/06/2018   HGBA1C 8.8 (H) 04/12/2018    Review of Glycemic Control  Diabetes history: DM2 Outpatient Diabetes medications: Lantus 20 units QHS, Novolog 6 units tidwc  Current orders for Inpatient glycemic control: Lantus 20 units QHS, Novolog 0-15 units tidwc and hs + 6 units tidwc  HgbA1C - 8.8% Only received Novolog 10 units total on 12/09.  Inpatient Diabetes Program Recommendations:     Agree with orders.  Will follow trends daily. May need increase in meal coverage if post-prandials > 180 mg/dL.  Thank you. Ailene Ardshonda Abbey Veith, RD, LDN, CDE Inpatient Diabetes Coordinator [phone]

## 2018-05-06 NOTE — Progress Notes (Signed)
Patient ID: Jeremy Gill, male   DOB: 1945/11/30, 72 y.o.   MRN: [id number]  PROGRESS NOTE    Jeremy Gill  XIP:[id number] DOB: [birth year] DOA: 05/04/2018 PCP: [place], [name], MD   Brief Narrative:  72 year old male with history of COPD, hypertension, diabetes mellitus type 2, moderate to severe dementia, nonambulatory, multiple recent admissions to the hospital including recent admission and discharge to skilled nursing facility on April 15, 2018 for acute metabolic encephalopathy due to urinary tract infection with possible pneumonia and polypharmacy presented on 05/04/2018 with dyspnea and hypoxemia.  He was placed on a nonrebreather, imaging showed bilateral lower lobe pneumonia.  He was started on intravenous antibiotics.   Assessment & Plan:   Active Problems:   Sepsis (HCC)   Pneumonia   Sepsis secondary to pneumonia -Hemodynamically improving.  Continue antibiotics.    Bilateral lower lobe probable aspiration pneumonia -Continue Unasyn.  Cultures negative so far.  Diet as per SLP recommendations.  Aspiration precautions  Acute hypoxic respiratory failure -Probably secondary to above.  Initially required nonrebreather.  Improving.  Currently on 2 L nasal cannula. -Wean off as able -Incentive spirometry  Acute metabolic encephalopathy in a patient with moderate to severe dementia -Probably secondary to above.   -Improving.  Mental status probably back to baseline.  Monitor.  Fall precautions  Acute kidney injury on chronic kidney disease stage III -Creatinine improving.  Monitor.  DC IV fluids  Hypokalemia -Replace.  Repeat a.m. labs  Leukocytosis -Monitor.  Hypertension -Monitor blood pressure.  Currently antihypertensives are on hold  Diabetes mellitus type 2 with hypoglycemia and hyperglycemia -Blood sugar extremely elevated today.  Will resume long-acting insulin along with short-acting insulin with meals.  Also continue CBGs with sliding scale insulin.   Carb modified diet  COPD -No signs of exacerbation.  Continue bronchodilator therapy  Generalized deconditioning -Overall prognosis is guarded to poor.  Patient has had multiple hospitalizations recently and dementia is also progressive getting worse as per the family member.  If condition does not improve, consider hospice/comfort measures.  Palliative care consult pending.  Dyslipidemia -Continue atorvastatin  DVT prophylaxis: Heparin Code Status: DNR Family Communication: None at bedside Disposition Plan: Discharge in the next 1 to 3 days if respiratory status improves Consultants: None  Procedures: None  Antimicrobials: Unasyn from 05/04/2018 onwards 1 dose of cefepime, Zithromax and vancomycin on 05/04/2018  Subjective: Patient seen and examined at bedside.  He is awake and pleasantly confused.  No overnight fever, nausea or vomiting.  No worsening shortness of breath or cough.  Objective: Vitals:   05/05/18 1600 05/05/18 1841 05/05/18 2016 05/06/18 0537  BP: (!) 109/54 122/77 103/74 118/75  Pulse: (!) 118 (!) 120 (!) 116 (!) 115  Resp: 20 20 19 20   Temp: 99.1 F (37.3 C) 98 F (36.7 C) 97.6 F (36.4 C) 98 F (36.7 C)  TempSrc: Oral Oral    SpO2: 97% 99% 100% 98%  Weight:      Height:        Intake/Output Summary (Last 24 hours) at 05/06/2018 1119 Last data filed at 05/06/2018 0546 Gross per 24 hour  Intake 1766 ml  Output 1800 ml  Net -34 ml   Filed Weights   05/04/18 1113  Weight: 72 kg    Examination:  General exam: Elderly male lying in bed.  No acute distress.  Pleasantly confused.  Looks chronically ill Respiratory system: Bilateral decreased breath sounds at bases, scattered crackles.  No wheezing Cardiovascular system: S1 &  S2 heard, tachycardic Gastrointestinal system: Abdomen is nondistended, soft and nontender. Normal bowel sounds heard. Extremities: No cyanosis, clubbing, edema   Data Reviewed: I have personally reviewed following labs and  imaging studies  CBC: Recent Labs  Lab 05/04/18 1128 05/05/18 0330 05/06/18 0606  WBC 14.0* 17.8* 17.7*  NEUTROABS 10.9*  --  15.4*  HGB 8.6* 9.0* 9.0*  HCT 27.2* 29.0* 28.6*  MCV 93.5 94.8 92.3  PLT 212 194 238   Basic Metabolic Panel: Recent Labs  Lab 05/04/18 1128 05/05/18 0330 05/06/18 0606 05/06/18 0755  NA 139 140 139  --   K 3.4* 3.3* 3.3*  --   CL 103 106 106  --   CO2 23 22 19*  --   GLUCOSE 58* 108* 500* 500*  BUN 59* 57* 57*  --   CREATININE 2.10* 1.84* 1.72*  --   CALCIUM 8.7* 8.3* 8.5*  --   MG  --   --  2.0  --    GFR: Estimated Creatinine Clearance: 39.5 mL/min (A) (by C-G formula based on SCr of 1.72 mg/dL (H)). Liver Function Tests: Recent Labs  Lab 05/04/18 1128 05/06/18 0606  AST 85* 93*  ALT 41 74*  ALKPHOS 73 68  BILITOT 1.0 1.2  PROT 7.3 6.5  ALBUMIN 2.7* 2.2*   No results for input(s): LIPASE, AMYLASE in the last 168 hours. No results for input(s): AMMONIA in the last 168 hours. Coagulation Profile: No results for input(s): INR, PROTIME in the last 168 hours. Cardiac Enzymes: No results for input(s): CKTOTAL, CKMB, CKMBINDEX, TROPONINI in the last 168 hours. BNP (last 3 results) No results for input(s): PROBNP in the last 8760 hours. HbA1C: No results for input(s): HGBA1C in the last 72 hours. CBG: Recent Labs  Lab 05/05/18 1127 05/05/18 1620 05/05/18 2018 05/06/18 0727 05/06/18 1116  GLUCAP 144* 361* 381* 512* 339*   Lipid Profile: No results for input(s): CHOL, HDL, LDLCALC, TRIG, CHOLHDL, LDLDIRECT in the last 72 hours. Thyroid Function Tests: No results for input(s): TSH, T4TOTAL, FREET4, T3FREE, THYROIDAB in the last 72 hours. Anemia Panel: No results for input(s): VITAMINB12, FOLATE, FERRITIN, TIBC, IRON, RETICCTPCT in the last 72 hours. Sepsis Labs: Recent Labs  Lab 05/04/18 1135 05/04/18 1313  LATICACIDVEN 0.69 0.65    Recent Results (from the past 240 hour(s))  Blood Culture (routine x 2)     Status: None  (Preliminary result)   Collection Time: 05/04/18 11:28 AM  Result Value Ref Range Status   Specimen Description   Final    RIGHT ANTECUBITAL Performed at [facility], [address] [place], [place] [address]    Special Requests   Final    BOTTLES DRAWN AEROBIC AND ANAEROBIC Blood Culture results may not be optimal due to an excessive volume of blood received in culture bottles Performed at [facility], [address] [place], [place] [address]    Culture   Final    NO GROWTH < 24 HOURS Performed at [facility], [address] [place], [place] [address]    Report Status PENDING  Incomplete  Blood Culture (routine x 2)     Status: None (Preliminary result)   Collection Time: 05/04/18 11:56 AM  Result Value Ref Range Status   Specimen Description   Final    BLOOD WRIST LEFT Performed at [facility], [address] [place], [place] [address]    Special Requests   Final    BOTTLES DRAWN  AEROBIC AND ANAEROBIC Blood Culture adequate volume Performed at [facility], [address], [place], [place] [address]    Culture   Final    NO GROWTH < 24 HOURS Performed at [facility], [address], [place], [place] [address]    Report Status PENDING  Incomplete  Urine culture     Status: Abnormal   Collection Time: 05/04/18 11:56 AM  Result Value Ref Range Status   Specimen Description   Final    URINE, RANDOM Performed at [facility], [address] [place], [place] [address]    Special Requests   Final    NONE Performed at [facility], [address] [place], [place] [address]    Culture (A)  Final    <10,000 COLONIES/mL INSIGNIFICANT GROWTH Performed at [facility], [address], [place], [place] [address]    Report Status 05/05/2018 FINAL  Final  MRSA PCR Screening     Status: None   Collection Time: 05/04/18  5:56 PM  Result  Value Ref Range Status   MRSA by PCR NEGATIVE NEGATIVE Final    Comment:        The GeneXpert MRSA Assay (FDA approved for NASAL specimens only), is one component of a comprehensive MRSA colonization surveillance program. It is not intended to diagnose MRSA infection nor to guide or monitor treatment for MRSA infections. Performed at [facility], [address] [place], [place] [address]          Radiology Studies: Dg Chest Port 1 View  Result Date: 05/04/2018 CLINICAL DATA:  Altered mental status beginning today. Abnormal oxygen saturation. EXAM: PORTABLE CHEST 1 VIEW COMPARISON:  04/08/2018 FINDINGS: Heart size is normal. Chronic aortic atherosclerosis. There is bronchopneumonia within the mid and lower lungs bilaterally. Upper lungs are clear. No visible effusion. No significant bone finding. IMPRESSION: Bilateral mid and lower lung bronchopneumonia. Electronically Signed   By: Paulina Fusi M.D.   On: 05/04/2018 11:42        Scheduled Meds: . aspirin EC  81 mg Oral Daily  . atorvastatin  40 mg Oral Daily  . DULoxetine  60 mg Oral Daily  . feeding supplement (ENSURE ENLIVE)  237 mL Oral BID BM  . folic acid  1 mg Oral Daily  . gabapentin  800 mg Oral TID  . insulin aspart  0-15 Units Subcutaneous TID WC  . insulin aspart  0-5 Units Subcutaneous QHS  . insulin aspart  6 Units Subcutaneous TID WC  . insulin glargine  20 Units Subcutaneous QHS  . mometasone-formoterol  2 puff Inhalation BID  . multivitamin with minerals  1 tablet Oral Daily  . nutrition supplement (JUVEN)  1 packet Oral BID BM  . potassium chloride  40 mEq Oral Once  . tamsulosin  0.4 mg Oral QPC breakfast   Continuous Infusions: . sodium chloride 75 mL/hr at 05/06/18 0309  . ampicillin-sulbactam (UNASYN) IV 3 g (05/06/18 0745)     LOS: 2 days        Glade Lloyd, MD [facility] Pager [phone]  If 7PM-7AM, please contact  night-coverage [email or web address] Password [email or web address] 05/06/2018, 11:19 AM

## 2018-05-07 ENCOUNTER — Other Ambulatory Visit: Payer: Self-pay | Admitting: *Deleted

## 2018-05-07 LAB — CBC WITH DIFFERENTIAL/PLATELET
Abs Immature Granulocytes: 0.13 10*3/uL — ABNORMAL HIGH (ref 0.00–0.07)
Basophils Absolute: 0 10*3/uL (ref 0.0–0.1)
Basophils Relative: 0 %
Eosinophils Absolute: 0.1 10*3/uL (ref 0.0–0.5)
Eosinophils Relative: 1 %
HCT: 25.8 % — ABNORMAL LOW (ref 39.0–52.0)
Hemoglobin: 8.3 g/dL — ABNORMAL LOW (ref 13.0–17.0)
Immature Granulocytes: 1 %
Lymphocytes Relative: 13 %
Lymphs Abs: 2.2 10*3/uL (ref 0.7–4.0)
MCH: 29.9 pg (ref 26.0–34.0)
MCHC: 32.2 g/dL (ref 30.0–36.0)
MCV: 92.8 fL (ref 80.0–100.0)
MONO ABS: 0.8 10*3/uL (ref 0.1–1.0)
Monocytes Relative: 5 %
Neutro Abs: 13.5 10*3/uL — ABNORMAL HIGH (ref 1.7–7.7)
Neutrophils Relative %: 80 %
Platelets: 231 10*3/uL (ref 150–400)
RBC: 2.78 MIL/uL — ABNORMAL LOW (ref 4.22–5.81)
RDW: 14.7 % (ref 11.5–15.5)
WBC: 16.7 10*3/uL — ABNORMAL HIGH (ref 4.0–10.5)
nRBC: 0 % (ref 0.0–0.2)

## 2018-05-07 LAB — BASIC METABOLIC PANEL
Anion gap: 12 (ref 5–15)
BUN: 42 mg/dL — ABNORMAL HIGH (ref 8–23)
CO2: 20 mmol/L — ABNORMAL LOW (ref 22–32)
Calcium: 8.4 mg/dL — ABNORMAL LOW (ref 8.9–10.3)
Chloride: 109 mmol/L (ref 98–111)
Creatinine, Ser: 1.47 mg/dL — ABNORMAL HIGH (ref 0.61–1.24)
GFR calc Af Amer: 54 mL/min — ABNORMAL LOW (ref >60)
GFR, EST NON AFRICAN AMERICAN: 47 mL/min — AB (ref >60)
Glucose, Bld: 315 mg/dL — ABNORMAL HIGH (ref 70–99)
Potassium: 3.2 mmol/L — ABNORMAL LOW (ref 3.5–5.1)
SODIUM: 141 mmol/L (ref 135–145)

## 2018-05-07 LAB — MAGNESIUM: Magnesium: 1.8 mg/dL (ref 1.7–2.4)

## 2018-05-07 LAB — GLUCOSE, CAPILLARY
Glucose-Capillary: 282 mg/dL — ABNORMAL HIGH (ref 70–99)
Glucose-Capillary: 228 mg/dL — ABNORMAL HIGH (ref 70–99)
Glucose-Capillary: 200 mg/dL — ABNORMAL HIGH (ref 70–99)
Glucose-Capillary: 155 mg/dL — ABNORMAL HIGH (ref 70–99)

## 2018-05-07 MED ORDER — MORPHINE SULFATE (CONCENTRATE) 10 MG/0.5ML PO SOLN: 10.0000 mg | ORAL | Status: DC | PRN

## 2018-05-07 MED ORDER — HYDROCODONE-ACETAMINOPHEN 7.5-325 MG PO TABS
1.0000 | ORAL_TABLET | Freq: Three times a day (TID) | ORAL | Status: DC
  Administered 2018-05-07 – 2018-05-09 (×6): 1 via ORAL
  Filled 2018-05-07 (×7): qty 1

## 2018-05-07 MED ORDER — INSULIN GLARGINE 100 UNIT/ML ~~LOC~~ SOLN
25.0000 [IU] | Freq: Every day | SUBCUTANEOUS | Status: DC
  Administered 2018-05-07 – 2018-05-08 (×2): 25 [IU] via SUBCUTANEOUS
  Filled 2018-05-07 (×2): qty 0.25

## 2018-05-07 MED ORDER — INSULIN ASPART 100 UNIT/ML ~~LOC~~ SOLN
10.0000 [IU] | Freq: Once | SUBCUTANEOUS | Status: AC
  Administered 2018-05-07: 10 [IU] via SUBCUTANEOUS

## 2018-05-07 MED ORDER — POTASSIUM CHLORIDE CRYS ER 20 MEQ PO TBCR
40.0000 meq | EXTENDED_RELEASE_TABLET | ORAL | Status: AC
  Administered 2018-05-07 (×2): 40 meq via ORAL
  Filled 2018-05-07 (×2): qty 2

## 2018-05-07 MED ORDER — INSULIN ASPART 100 UNIT/ML ~~LOC~~ SOLN
8.0000 [IU] | Freq: Three times a day (TID) | SUBCUTANEOUS | Status: DC
  Administered 2018-05-07 – 2018-05-09 (×6): 8 [IU] via SUBCUTANEOUS

## 2018-05-07 NOTE — Progress Notes (Addendum)
Met with Jeremy Gill and was able to get their son Jeremy Gill on the phone to discuss goals of care. Jeremy Gill sees her husband decline and is increasingly frustrated with multiple rehospitalization. Her goal is comfort and dignity for him. We discussed taking him home with hospice care - she worries she cannot handle him but believes with the support of hospice she could care for him. Pain control is important to the patient and to his wife- she does not believe further attempts at rehabilitation will be successful.  1. Will focus on stabilizing any medical issues as much as possible prior to discharge. 2. Will treat his pain with low dose opioids and titrate as needed. 3. Home with hospice-will need a hospital bed and for hospice to admit on the day of discharge. 4. Minimize medication regimen 5. DNR, avoid rehospitalization 6. Maintain foley catheter log term for urinary retension.  Time: 70 min Greater than 50%  of this time was spent counseling and coordinating care related to the above assessment and plan. Lane Hacker, DO Palliative Medicine

## 2018-05-07 NOTE — Progress Notes (Signed)
PT Cancellation Note  Patient Details Name: Lazarus SalinesJames E Kaczmarczyk MRN: [id number] DOB: May 02, 1946   Cancelled Treatment:    Reason Eval/Treat Not Completed: PT screened, no needs identified, will sign off. Spoke with family who report pt is going home with hospice. They are not interested in PT services.  Will sign off.   Enzo MontgomeryKaren L Avanti Jetter 05/07/2018, 10:37 AM

## 2018-05-07 NOTE — Progress Notes (Signed)
Pt[facility] at bedside [facility] [phone].  Plan for discharge in am or Friday.

## 2018-05-07 NOTE — Progress Notes (Signed)
Patient ID: Jeremy Gill, male   DOB: 05-06-1946, 72 y.o.   MRN: [id number]  PROGRESS NOTE    Jeremy Gill  WUJ:[id number] DOB: Sep 03, 1945 DOA: 05/04/2018 PCP: [place], Jeremy Gill   Brief Narrative:  72 year old male with history of COPD, hypertension, diabetes mellitus type 2, moderate to severe dementia, nonambulatory, multiple recent admissions to the hospital including recent admission and discharge to skilled nursing facility on April 15, 2018 for acute metabolic encephalopathy due to urinary tract infection with possible pneumonia and polypharmacy presented on 05/04/2018 with dyspnea and hypoxemia.  He was placed on a nonrebreather, imaging showed bilateral lower lobe pneumonia.  He was started on intravenous antibiotics.   Assessment & Plan:   Active Problems:   Sepsis (HCC)   Pneumonia   Sepsis secondary to pneumonia -Hemodynamically improving.  Continue antibiotics.    Bilateral lower lobe probable aspiration pneumonia -Continue Unasyn.  Cultures negative so far.  Diet as per SLP recommendations.  Aspiration precautions  Acute hypoxic respiratory failure -Probably secondary to above.  Initially required nonrebreather.  Improving.  Currently on 2 L nasal cannula. -Wean off as able -Incentive spirometry  Acute metabolic encephalopathy in a patient with moderate to severe dementia -Probably secondary to above.   -Improving.  Mental status probably back to baseline.  Monitor.  Fall precautions  Acute kidney injury on chronic kidney disease stage III -Creatinine improving.  Monitor.  Off IV fluids  Hypokalemia -Replace.  Repeat a.m. labs  Leukocytosis -Improving.  Monitor.  Hypertension -Monitor blood pressure.  Currently antihypertensives are on hold  Diabetes mellitus type 2 with hypoglycemia and hyperglycemia -Still hyperglycemic.  Increase Lantus to 25 units nightly and NovoLog to 8 units 3 times a day with meals along with sliding scale  coverage  COPD -No signs of exacerbation.  Continue bronchodilator therapy  Generalized deconditioning -Overall prognosis is guarded to poor.  Patient has had multiple hospitalizations recently and dementia is also progressive getting worse as per the family member. -Palliative care evaluation appreciated.  Patient will probably go home with home hospice on discharge  Dyslipidemia -Continue atorvastatin  DVT prophylaxis: Heparin Code Status: DNR Family Communication: None at bedside Disposition Plan: Discharge in the next 1 to 2 days with home hospice if respiratory status remains stable  consultants: Palliative care  Procedures: None  Antimicrobials: Unasyn from 05/04/2018 onwards 1 dose of cefepime, Zithromax and vancomycin on 05/04/2018  Subjective: Patient seen and examined at bedside.  He is sleepy, wakes up only slightly on calling his name and is confused, does not answer much questions.  No overnight fever or vomiting reported.  Objective: Vitals:   05/06/18 1947 05/06/18 2024 05/06/18 2044 05/07/18 0437  BP: (!) 93/59 110/64  120/78  Pulse: (!) 106   (!) 110  Resp: 20   18  Temp: 98.5 F (36.9 C)   98.3 F (36.8 C)  TempSrc: Oral   Oral  SpO2: 92%  94% 97%  Weight:      Height:        Intake/Output Summary (Last 24 hours) at 05/07/2018 0858 Last data filed at 05/07/2018 0546 Gross per 24 hour  Intake 360 ml  Output 2200 ml  Net -1840 ml   Filed Weights   05/04/18 1113  Weight: 72 kg    Examination:  General exam: Elderly male lying in bed.  No distress.  Sleepy, wakes up only slightly, does not answer much questions.   Respiratory system: Bilateral decreased breath sounds at bases, scattered  crackles. Cardiovascular system: S1 & S2 heard, tachycardic Gastrointestinal system: Abdomen is nondistended, soft and nontender. Normal bowel sounds heard. Extremities: No cyanosis, edema   Data Reviewed: I have personally reviewed following labs and imaging  studies  CBC: Recent Labs  Lab 05/04/18 1128 05/05/18 0330 05/06/18 0606 05/07/18 0604  WBC 14.0* 17.8* 17.7* 16.7*  NEUTROABS 10.9*  --  15.4* 13.5*  HGB 8.6* 9.0* 9.0* 8.3*  HCT 27.2* 29.0* 28.6* 25.8*  MCV 93.5 94.8 92.3 92.8  PLT 212 194 238 231   Basic Metabolic Panel: Recent Labs  Lab 05/04/18 1128 05/05/18 0330 05/06/18 0606 05/06/18 0755 05/06/18 2154 05/07/18 0604  NA 139 140 139  --  138 141  K 3.4* 3.3* 3.3*  --  3.5 3.2*  CL 103 106 106  --  107 109  CO2 23 22 19*  --  19* 20*  GLUCOSE 58* 108* 500* 500* 421* 315*  BUN 59* 57* 57*  --  48* 42*  CREATININE 2.10* 1.84* 1.72*  --  1.55* 1.47*  CALCIUM 8.7* 8.3* 8.5*  --  8.5* 8.4*  MG  --   --  2.0  --   --  1.8   GFR: Estimated Creatinine Clearance: 46.3 mL/min (A) (by C-Gill formula based on SCr of 1.47 mg/dL (H)). Liver Function Tests: Recent Labs  Lab 05/04/18 1128 05/06/18 0606  AST 85* 93*  ALT 41 74*  ALKPHOS 73 68  BILITOT 1.0 1.2  PROT 7.3 6.5  ALBUMIN 2.7* 2.2*   No results for input(s): LIPASE, AMYLASE in the last 168 hours. No results for input(s): AMMONIA in the last 168 hours. Coagulation Profile: No results for input(s): INR, PROTIME in the last 168 hours. Cardiac Enzymes: No results for input(s): CKTOTAL, CKMB, CKMBINDEX, TROPONINI in the last 168 hours. BNP (last 3 results) No results for input(s): PROBNP in the last 8760 hours. HbA1C: No results for input(s): HGBA1C in the last 72 hours. CBG: Recent Labs  Lab 05/06/18 0727 05/06/18 1116 05/06/18 1641 05/06/18 1942 05/07/18 0747  GLUCAP 512* 339* 129* 425* 282*   Lipid Profile: No results for input(s): CHOL, HDL, LDLCALC, TRIG, CHOLHDL, LDLDIRECT in the last 72 hours. Thyroid Function Tests: No results for input(s): TSH, T4TOTAL, FREET4, T3FREE, THYROIDAB in the last 72 hours. Anemia Panel: No results for input(s): VITAMINB12, FOLATE, FERRITIN, TIBC, IRON, RETICCTPCT in the last 72 hours. Sepsis Labs: Recent Labs  Lab  05/04/18 1135 05/04/18 1313  LATICACIDVEN 0.69 0.65    Recent Results (from the past 240 hour(s))  Blood Culture (routine x 2)     Status: None (Preliminary result)   Collection Time: 05/04/18 11:28 AM  Result Value Ref Range Status   Specimen Description   Final    RIGHT ANTECUBITAL Performed at [facility], [address], [place], [place] [address]    Special Requests   Final    BOTTLES DRAWN AEROBIC AND ANAEROBIC Blood Culture results may not be optimal due to an excessive volume of blood received in culture bottles Performed at [facility], [address], [place], [place] [address]    Culture   Final    NO GROWTH 2 DAYS Performed at [facility], [address], [place], [place] [address]    Report Status PENDING  Incomplete  Blood Culture (routine x 2)     Status: None (Preliminary result)   Collection Time: 05/04/18 11:56 AM  Result Value Ref Range Status   Specimen Description  Final    BLOOD WRIST LEFT Performed at [facility], [address] [place], [place] [address]    Special Requests   Final    BOTTLES DRAWN AEROBIC AND ANAEROBIC Blood Culture adequate volume Performed at [facility], [address] [place], [place] [address]    Culture   Final    NO GROWTH 2 DAYS Performed at [facility], [address] [place], [place] [address]    Report Status PENDING  Incomplete  Urine culture     Status: Abnormal   Collection Time: 05/04/18 11:56 AM  Result Value Ref Range Status   Specimen Description   Final    URINE, RANDOM Performed at [facility], [address] [place], [place] [address]    Special Requests   Final    NONE Performed at [facility], [address] [place], [place] [address]    Culture (A)  Final    <10,000 COLONIES/mL INSIGNIFICANT GROWTH Performed at [facility], [address]  [place], [place] [address]    Report Status 05/05/2018 FINAL  Final  MRSA PCR Screening     Status: None   Collection Time: 05/04/18  5:56 PM  Result Value Ref Range Status   MRSA by PCR NEGATIVE NEGATIVE Final    Comment:        The GeneXpert MRSA Assay (FDA approved for NASAL specimens only), is one component of a comprehensive MRSA colonization surveillance program. It is not intended to diagnose MRSA infection nor to guide or monitor treatment for MRSA infections. Performed at [facility], [address] [place], [place] [address]          Radiology Studies: No results found.      Scheduled Meds: . aspirin EC  81 mg Oral Daily  . atorvastatin  40 mg Oral Daily  . DULoxetine  60 mg Oral Daily  . feeding supplement (ENSURE ENLIVE)  237 mL Oral BID BM  . folic acid  1 mg Oral Daily  . HYDROcodone-acetaminophen  1 tablet Oral Q8H  . insulin aspart  0-15 Units Subcutaneous TID WC  . insulin aspart  0-5 Units Subcutaneous QHS  . insulin aspart  6 Units Subcutaneous TID WC  . insulin glargine  20 Units Subcutaneous QHS  . mometasone-formoterol  2 puff Inhalation BID  . nutrition supplement (JUVEN)  1 packet Oral BID BM  . potassium chloride  40 mEq Oral Q4H  . tamsulosin  0.4 mg Oral QPC breakfast   Continuous Infusions: . ampicillin-sulbactam (UNASYN) IV 3 Gill (05/06/18 2355)     LOS: 3 days        Jeremy LloydKshitiz Jakorian [place], Gill [facility] Pager [phone]  If 7PM-7AM, please contact night-coverage [email or web address] Password [email or web address] 05/07/2018, 8:58 AM

## 2018-05-07 NOTE — Progress Notes (Addendum)
Pt's MEWS score has been in the yellow most of this admission d/t his pulse rate. Not an acute change. Has been running 100-115 regularly. Will continue to monitor and notify provider as needed.

## 2018-05-07 NOTE — Care Management Important Message (Signed)
Important Message  Patient Details  Name: Jeremy Gill MRN: [id number] Date of Birth: 02-05-1946   Medicare Important Message Given:  Yes    Caren MacadamFuller, Jacynda Brunke 05/07/2018, 12:02 PMImportant Message  Patient Details  Name: Jeremy Gill MRN: [id number] Date of Birth: 02-05-1946   Medicare Important Message Given:  Yes    Caren MacadamFuller, Jacey Pelc 05/07/2018, 12:02 PM

## 2018-05-07 NOTE — Consult Note (Signed)
   [facility]   05/07/2018  Jeremy Gill 07-23-1945 [id number]     [facility] follow up.  Spoke with inpatient RNCM to confirm discharge plan is for home with hospice services.      Raiford NobleAtika Hall, MSN-Ed, RN,BSN [facility] Care [facility] Hospital Liaison [phone]

## 2018-05-07 NOTE — Patient Outreach (Signed)
[name]  05/07/2018  Jeremy SalinesJames E Gill 01-19-1946 [id number]  Made aware by [facility] Hospital Liaison Atika patient's discharge plan is home with hospice services.   Alerted involved [facility] team members.   Patient is not appropriate for [facility] CM services at this time. If patient's discharge disposition should change please place a [facility] CM referral.   Arrionna Serena RN, BSN [facility]  Post Acute Care Coordinator [phone]) Business Mobile [phone]) Toll free office

## 2018-05-08 ENCOUNTER — Telehealth: Payer: Self-pay

## 2018-05-08 DIAGNOSIS — E1165 Type 2 diabetes mellitus with hyperglycemia: Secondary | ICD-10-CM

## 2018-05-08 DIAGNOSIS — J690 Pneumonitis due to inhalation of food and vomit: Secondary | ICD-10-CM

## 2018-05-08 LAB — BASIC METABOLIC PANEL
Anion gap: 9 (ref 5–15)
BUN: 42 mg/dL — ABNORMAL HIGH (ref 8–23)
CO2: 24 mmol/L (ref 22–32)
Calcium: 8.9 mg/dL (ref 8.9–10.3)
Chloride: 111 mmol/L (ref 98–111)
Creatinine, Ser: 1.33 mg/dL — ABNORMAL HIGH (ref 0.61–1.24)
GFR calc Af Amer: >60 mL/min (ref >60)
GFR calc non Af Amer: 53 mL/min — ABNORMAL LOW (ref >60)
Glucose, Bld: 223 mg/dL — ABNORMAL HIGH (ref 70–99)
Potassium: 3.8 mmol/L (ref 3.5–5.1)
Sodium: 144 mmol/L (ref 135–145)

## 2018-05-08 LAB — GLUCOSE, CAPILLARY
GLUCOSE-CAPILLARY: 94 mg/dL (ref 70–99)
Glucose-Capillary: 233 mg/dL — ABNORMAL HIGH (ref 70–99)
Glucose-Capillary: 182 mg/dL — ABNORMAL HIGH (ref 70–99)
Glucose-Capillary: 130 mg/dL — ABNORMAL HIGH (ref 70–99)
Glucose-Capillary: 69 mg/dL — ABNORMAL LOW (ref 70–99)

## 2018-05-08 MED ORDER — AMOXICILLIN-POT CLAVULANATE 875-125 MG PO TABS
1.0000 | ORAL_TABLET | Freq: Two times a day (BID) | ORAL | Status: DC
  Administered 2018-05-08 – 2018-05-09 (×3): 1 via ORAL
  Filled 2018-05-08 (×3): qty 1

## 2018-05-08 NOTE — Telephone Encounter (Signed)
Copied from CRM [id number]. Topic: Quick Communication - Home Health Verbal Orders >> May 08, 2018  2:42 PM Waymon AmatoBurton, Donna F wrote: Caller/Agency: Drenda FreezeFran with Hospice  Callback Number: [phone] Requesting OT/PT/Skilled Nursing/Social Work: wants to know if Dr. [place]Jordan  will be the attending provider for this pt in hospice  Frequency: no frequency

## 2018-05-08 NOTE — Progress Notes (Signed)
Pt's CBG is 94 after drinking orange juice. Asymptomatic. No new complaints. Will continue to monitor.

## 2018-05-08 NOTE — Progress Notes (Signed)
[facility] [employer] Met with pt and family to discuss hospice care and philosophy. The pt is awake does make conversation with repeating what one says at times. He has a foley cath and we are able to care for his in the home-- He has been approved for hospice services in the home upon discharge. We will order from [facility] to have delivered hospital bed, oxygen, OBT. He has in home a walker and wheelchair which they own. He will need to go home by ambulance. Webb Silversmith RN [phone]

## 2018-05-08 NOTE — Progress Notes (Signed)
CBG this evening is 69. Giving pt orange juice and will recheck CBG in 15 min.

## 2018-05-08 NOTE — Progress Notes (Signed)
Patient ID: Jeremy Gill, male   DOB: [birth year], 72 y.o.   MRN: [id number]  PROGRESS NOTE    Jeremy Gill  OZH:[id number] DOB: [birth year] DOA: 05/04/2018 PCP: [place], [name], MD   Brief Narrative:  72 year old male with history of COPD, hypertension, diabetes mellitus type 2, moderate to severe dementia, nonambulatory, multiple recent admissions to the hospital including recent admission and discharge to skilled nursing facility on April 15, 2018 for acute metabolic encephalopathy due to urinary tract infection with possible pneumonia and polypharmacy presented on 05/04/2018 with dyspnea and hypoxemia.  He was placed on a nonrebreather, imaging showed bilateral lower lobe pneumonia.  He was started on intravenous antibiotics.   Assessment & Plan:   Sepsis secondary to pneumonia Patient has improved.  He is afebrile.  He remains on Unasyn.  Will be transitioned to Augmentin today.  Bilateral lower lobe probable aspiration pneumonia Cultures negative so far.  Diet as per SLP recommendations.  Aspiration precautions.  Change to Augmentin today.  Acute hypoxic respiratory failure -Probably secondary to above.  Initially required nonrebreather.  Improving.  Currently on 2 L nasal cannula. -Wean off as able -Incentive spirometry  Acute metabolic encephalopathy in a patient with moderate to severe dementia -Probably secondary to above.   Mental status probably back to baseline.  Monitor.  Fall precautions  Acute kidney injury on chronic kidney disease stage III -Creatinine improving.  Monitor urine output.  Off IV fluids  Hypokalemia This was repleted.  Potassium normal this morning.  Leukocytosis WBC had been improving.  Normocytic anemia No evidence of overt bleeding.  Hemoglobin has been stable.  Likely due to chronic disease.  Essential hypertension Blood pressure reasonably well controlled.  Antihypertensives were placed on hold.  Diabetes mellitus type 2 uncontrolled,  with hyperglycemia CBGs remain poorly controlled.  Dose of Lantus was increased yesterday.  We will continue to watch for now without any further changes today.  Continue sliding scale coverage as well.  HbA1c 8.8 in November.  COPD -No signs of exacerbation.  Continue bronchodilator therapy  Generalized deconditioning -Overall prognosis is guarded to poor.  Patient has had multiple hospitalizations recently and dementia is also progressive getting worse as per the family member. -Palliative care evaluation appreciated.  Patient will go home with home hospice on discharge  Dyslipidemia -Continue atorvastatin  DVT prophylaxis: Heparin Code Status: DNR Family Communication: No family at bedside Disposition Plan: Change to oral antibiotics today.  Anticipate discharge to home with hospice tomorrow.  Consultants: Palliative care  Procedures: None  Antimicrobials:  Unasyn from 05/04/2018 onwards Changed to Augmentin on 12/12 1 dose of cefepime, Zithromax and vancomycin on 05/04/2018  Subjective: Patient awake alert.  Distracted.  Does not really communicate much.  Confused.  Seems to be at his baseline.  Objective: Vitals:   05/07/18 2041 05/07/18 2152 05/08/18 0455 05/08/18 1016  BP: 118/75  124/77   Pulse: (!) 113  (!) 108   Resp: 20  19   Temp: 99.8 F (37.7 C)  (!) 97.5 F (36.4 C)   TempSrc:      SpO2: 98% 97% 98% 96%  Weight:      Height:        Intake/Output Summary (Last 24 hours) at 05/08/2018 1350 Last data filed at 05/08/2018 1051 Gross per 24 hour  Intake 240 ml  Output 900 ml  Net -660 ml   Filed Weights   05/04/18 1113  Weight: 72 kg    Examination:  Awake alert.  Distracted.  Confused. Coarse breath sounds bilaterally.  Normal effort at rest.  Few crackles at the bases.  No wheezing or rhonchi S1-S2 is normal regular.  No S3-S4.  No rubs murmurs or bruit Abdomen is soft.  Nontender nondistended Patient is awake alert.  Confused.  No obvious focal  neurological deficits.  Data Reviewed: I have personally reviewed following labs and imaging studies  CBC: Recent Labs  Lab 05/04/18 1128 05/05/18 0330 05/06/18 0606 05/07/18 0604  WBC 14.0* 17.8* 17.7* 16.7*  NEUTROABS 10.9*  --  15.4* 13.5*  HGB 8.6* 9.0* 9.0* 8.3*  HCT 27.2* 29.0* 28.6* 25.8*  MCV 93.5 94.8 92.3 92.8  PLT 212 194 238 231   Basic Metabolic Panel: Recent Labs  Lab 05/05/18 0330 05/06/18 0606 05/06/18 0755 05/06/18 2154 05/07/18 0604 05/08/18 0606  NA 140 139  --  138 141 144  K 3.3* 3.3*  --  3.5 3.2* 3.8  CL 106 106  --  107 109 111  CO2 22 19*  --  19* 20* 24  GLUCOSE 108* 500* 500* 421* 315* 223*  BUN 57* 57*  --  48* 42* 42*  CREATININE 1.84* 1.72*  --  1.55* 1.47* 1.33*  CALCIUM 8.3* 8.5*  --  8.5* 8.4* 8.9  MG  --  2.0  --   --  1.8  --    GFR: Estimated Creatinine Clearance: 51.1 mL/min (A) (by C-G formula based on SCr of 1.33 mg/dL (H)). Liver Function Tests: Recent Labs  Lab 05/04/18 1128 05/06/18 0606  AST 85* 93*  ALT 41 74*  ALKPHOS 73 68  BILITOT 1.0 1.2  PROT 7.3 6.5  ALBUMIN 2.7* 2.2*   CBG: Recent Labs  Lab 05/07/18 1123 05/07/18 1634 05/07/18 2044 05/08/18 0737 05/08/18 1122  GLUCAP 228* 200* 155* 233* 182*   Sepsis Labs: Recent Labs  Lab 05/04/18 1135 05/04/18 1313  LATICACIDVEN 0.69 0.65    Recent Results (from the past 240 hour(s))  Blood Culture (routine x 2)     Status: None (Preliminary result)   Collection Time: 05/04/18 11:28 AM  Result Value Ref Range Status   Specimen Description   Final    RIGHT ANTECUBITAL Performed at [facility], [address], [place], [place] [address]    Special Requests   Final    BOTTLES DRAWN AEROBIC AND ANAEROBIC Blood Culture results may not be optimal due to an excessive volume of blood received in culture bottles Performed at [facility], [address], [place], [place] [address]    Culture   Final    NO GROWTH 3  DAYS Performed at [facility], [address], [place], [place] [address]    Report Status PENDING  Incomplete  Blood Culture (routine x 2)     Status: None (Preliminary result)   Collection Time: 05/04/18 11:56 AM  Result Value Ref Range Status   Specimen Description   Final    BLOOD WRIST LEFT Performed at [facility], [address], [place], [place] [address]    Special Requests   Final    BOTTLES DRAWN AEROBIC AND ANAEROBIC Blood Culture adequate volume Performed at [facility], [address], [place], [place] [address]    Culture   Final    NO GROWTH 3 DAYS Performed at [facility], [address], [place], [place] [address]    Report Status PENDING  Incomplete  Urine culture  Status: Abnormal   Collection Time: 05/04/18 11:56 AM  Result Value Ref Range Status   Specimen Description   Final    URINE, RANDOM Performed at [facility], [address] [place], [place] [address]    Special Requests   Final    NONE Performed at [facility], [address] [place], [place] [address]    Culture (A)  Final    <10,000 COLONIES/mL INSIGNIFICANT GROWTH Performed at [facility], [address], [place], [place] [address]    Report Status 05/05/2018 FINAL  Final  MRSA PCR Screening     Status: None   Collection Time: 05/04/18  5:56 PM  Result Value Ref Range Status   MRSA by PCR NEGATIVE NEGATIVE Final    Comment:        The GeneXpert MRSA Assay (FDA approved for NASAL specimens only), is one component of a comprehensive MRSA colonization surveillance program. It is not intended to diagnose MRSA infection nor to guide or monitor treatment for MRSA infections. Performed at [facility], [address], [place], [place] [address]          Radiology Studies: No results found.      Scheduled Meds: . aspirin EC  81 mg Oral Daily  .  atorvastatin  40 mg Oral Daily  . DULoxetine  60 mg Oral Daily  . feeding supplement (ENSURE ENLIVE)  237 mL Oral BID BM  . folic acid  1 mg Oral Daily  . HYDROcodone-acetaminophen  1 tablet Oral Q8H  . insulin aspart  0-15 Units Subcutaneous TID WC  . insulin aspart  0-5 Units Subcutaneous QHS  . insulin aspart  8 Units Subcutaneous TID WC  . insulin glargine  25 Units Subcutaneous QHS  . mometasone-formoterol  2 puff Inhalation BID  . nutrition supplement (JUVEN)  1 packet Oral BID BM  . tamsulosin  0.4 mg Oral QPC breakfast   Continuous Infusions: . ampicillin-sulbactam (UNASYN) IV 3 g (05/08/18 0854)     LOS: 4 days    Osvaldo Shipper, MD [facility] Pager [phone]  If 7PM-7AM, please contact night-coverage [email or web address] Password [email or web address] 05/08/2018, 1:50 PM

## 2018-05-09 ENCOUNTER — Other Ambulatory Visit: Payer: Self-pay | Admitting: *Deleted

## 2018-05-09 LAB — CULTURE, BLOOD (ROUTINE X 2)
CULTURE: NO GROWTH
CULTURE: NO GROWTH
SPECIAL REQUESTS: ADEQUATE

## 2018-05-09 LAB — GLUCOSE, CAPILLARY
GLUCOSE-CAPILLARY: 78 mg/dL (ref 70–99)
Glucose-Capillary: 114 mg/dL — ABNORMAL HIGH (ref 70–99)

## 2018-05-09 MED ORDER — AMOXICILLIN-POT CLAVULANATE 875-125 MG PO TABS: 1.0000 | ORAL_TABLET | Freq: Two times a day (BID) | ORAL | 0 refills | Status: AC

## 2018-05-09 MED ORDER — INSULIN GLARGINE 100 UNIT/ML SOLOSTAR PEN: 20.0000 [IU] | PEN_INJECTOR | Freq: Every day | SUBCUTANEOUS | Status: AC

## 2018-05-09 MED ORDER — MORPHINE SULFATE (CONCENTRATE) 10 MG /0.5 ML PO SOLN: 10.0000 mg | ORAL | 0 refills | Status: AC | PRN

## 2018-05-09 MED ORDER — HYDROCODONE-ACETAMINOPHEN 7.5-325 MG PO TABS: 1.0000 | ORAL_TABLET | Freq: Three times a day (TID) | ORAL | 0 refills | Status: AC

## 2018-05-09 MED ORDER — LORAZEPAM 2 MG/ML PO CONC: 0.6000 mg | Freq: Three times a day (TID) | ORAL | 0 refills | Status: AC | PRN

## 2018-05-09 NOTE — Discharge Summary (Signed)
[facility]  Physician Discharge Summary   Patient ID: Jeremy Gill MRN: [id number] DOB/AGE: [birth year] 72 y.o.  Admit date: 05/04/2018 Discharge date: 05/09/2018  PCP: [place], [name], MD  DISCHARGE DIAGNOSES:  Advanced dementia, now hospice candidate Sepsis secondary to aspiration pneumonia, improved Aspiration pneumonia Normocytic anemia Essential hypertension Diabetes mellitus type 2 uncontrolled Chronic kidney disease stage III   RECOMMENDATIONS FOR OUTPATIENT FOLLOW UP: 1. Patient being discharged home with hospice services   DISCHARGE CONDITION: fair  Diet recommendation: Dysphagia 3 diet with thin liquids  Filed Weights   05/04/18 1113  Weight: 72 kg    INITIAL HISTORY: 72 year old male with history of COPD, hypertension, diabetes mellitus type 2, moderate to severe dementia, nonambulatory, multiple recent admissions to the hospital including recent admission and discharge to skilled nursing facility on April 15, 2018 for acute metabolic encephalopathy due to urinary tract infection with possible pneumonia and polypharmacy presented on 05/04/2018 with dyspnea and hypoxemia.  He was placed on a nonrebreather, imaging showed bilateral lower lobe pneumonia.  He was started on intravenous antibiotics.  Consultations:  Palliative medicine  Procedures:  None   HOSPITAL COURSE:   Sepsis secondary to pneumonia Patient was treated with antibiotics.  Sepsis physiology resolved.  He was transitioned to Unasyn and then transitioned to Augmentin.    Bilateral lower lobe probable aspiration pneumonia Cultures negative so far.    Dysphagia 3 diet recommended by speech therapy.  Aspiration precautions.  Augmentin as discussed above.    Acute hypoxic respiratory failure -Probably secondary to above.  Initially required nonrebreather.  Subsequently improved.  Currently on 2 L of oxygen by nasal cannula.  Incentive spirometry.  Acute metabolic  encephalopathy in a patient with moderate to severe dementia Encephalopathy was secondary to his infection.  Mental status appears to be back to baseline.  He does have advanced dementia.  Acute kidney injury on chronic kidney disease stage III Creatinine improving.   Hypokalemia This was repleted.  Potassium normal this morning.  Leukocytosis WBC had been improving.  Normocytic anemia No evidence of overt bleeding.  Hemoglobin has been stable.  Likely due to chronic disease.  Essential hypertension Antihypertensives were initially placed on hold.  Medications will be adjusted at discharge.  Diabetes mellitus type 2 uncontrolled, with hyperglycemia CBGs have been uncontrolled during this hospital stay including episodes of hyper and hypoglycemia.  Will tolerate higher levels of glucose in this individual.  Will not strive for tight control.  Will be discharged on a lower dose of Lantus.  HbA1c was 8.8 in November.    COPD No signs of exacerbation.  Continue bronchodilator therapy  Generalized deconditioning -Overall prognosis is guarded to poor.  Patient has had multiple hospitalizations recently and dementia is also progressive getting worse as per the family member. -Allergy medicine was consulted.  Discussions were held with family.  Due to his worsening dementia frequent hospitalization he is thought to be a candidate for hospice.  He will be discharged home with hospice services.    Dyslipidemia -Continue atorvastatin  Patient with history of BPH.  Had urinary retention during previous hospitalization.  Has a Foley which can be cared for by his hospice providers.  Overall stable.  Okay for discharge from the hospital today.  He is DNR.   PERTINENT LABS:  The results of significant diagnostics from this hospitalization (including imaging, microbiology, ancillary and laboratory) are listed below for reference.    Microbiology: Recent Results (from the past 240  hour(s))  Blood Culture (  routine x 2)     Status: None   Collection Time: 05/04/18 11:28 AM  Result Value Ref Range Status   Specimen Description   Final    RIGHT ANTECUBITAL Performed at [facility], [address] [place], [place] [address]    Special Requests   Final    BOTTLES DRAWN AEROBIC AND ANAEROBIC Blood Culture results may not be optimal due to an excessive volume of blood received in culture bottles Performed at [facility], [address], [place], [place] [address]    Culture   Final    NO GROWTH 5 DAYS Performed at [facility], [address] [place], [place] [address]    Report Status 05/09/2018 FINAL  Final  Blood Culture (routine x 2)     Status: None   Collection Time: 05/04/18 11:56 AM  Result Value Ref Range Status   Specimen Description   Final    BLOOD WRIST LEFT Performed at [facility], [address], [place], [place] [address]    Special Requests   Final    BOTTLES DRAWN AEROBIC AND ANAEROBIC Blood Culture adequate volume Performed at [facility], [address] [place], [place] [address]    Culture   Final    NO GROWTH 5 DAYS Performed at [facility], [address], [place], [place] [address]    Report Status 05/09/2018 FINAL  Final  Urine culture     Status: Abnormal   Collection Time: 05/04/18 11:56 AM  Result Value Ref Range Status   Specimen Description   Final    URINE, RANDOM Performed at [facility], [address] [place], [place] [address]    Special Requests   Final    NONE Performed at [facility], [address] [place], [place] [address]    Culture (A)  Final    <10,000 COLONIES/mL INSIGNIFICANT GROWTH Performed at [facility], [address], [place], [place] [address]    Report Status 05/05/2018 FINAL  Final  MRSA PCR Screening     Status: None   Collection Time: 05/04/18  5:56 PM   Result Value Ref Range Status   MRSA by PCR NEGATIVE NEGATIVE Final    Comment:        The GeneXpert MRSA Assay (FDA approved for NASAL specimens only), is one component of a comprehensive MRSA colonization surveillance program. It is not intended to diagnose MRSA infection nor to guide or monitor treatment for MRSA infections. Performed at [facility], [address], [place], [place] [address]      Labs: Basic Metabolic Panel: Recent Labs  Lab 05/05/18 0330 05/06/18 0606 05/06/18 0755 05/06/18 2154 05/07/18 0604 05/08/18 0606  NA 140 139  --  138 141 144  K 3.3* 3.3*  --  3.5 3.2* 3.8  CL 106 106  --  107 109 111  CO2 22 19*  --  19* 20* 24  GLUCOSE 108* 500* 500* 421* 315* 223*  BUN 57* 57*  --  48* 42* 42*  CREATININE 1.84* 1.72*  --  1.55* 1.47* 1.33*  CALCIUM 8.3* 8.5*  --  8.5* 8.4* 8.9  MG  --  2.0  --   --  1.8  --    Liver Function Tests: Recent Labs  Lab 05/04/18 1128 05/06/18 0606  AST 85* 93*  ALT 41 74*  ALKPHOS 73 68  BILITOT 1.0 1.2  PROT 7.3 6.5  ALBUMIN 2.7* 2.2*   CBC: Recent Labs  Lab 05/04/18 1128 05/05/18 0330 05/06/18 0606 05/07/18 0604  WBC 14.0* 17.8* 17.7* 16.7*  NEUTROABS 10.9*  --  15.4* 13.5*  HGB 8.6* 9.0* 9.0* 8.3*  HCT 27.2* 29.0* 28.6* 25.8*  MCV 93.5 94.8 92.3 92.8  PLT 212 194 238 231   BNP: BNP (last 3 results) Recent Labs    05/04/18 1133  BNP 1,141.1*     CBG: Recent Labs  Lab 05/08/18 1122 05/08/18 1651 05/08/18 2018 05/08/18 2123 05/09/18 0726  GLUCAP 182* 130* 69* 94 114*     IMAGING STUDIES  Dg Chest Port 1 View  Result Date: 05/04/2018 CLINICAL DATA:  Altered mental status beginning today. Abnormal oxygen saturation. EXAM: PORTABLE CHEST 1 VIEW COMPARISON:  04/08/2018 FINDINGS: Heart size is normal. Chronic aortic atherosclerosis. There is bronchopneumonia within the mid and lower lungs bilaterally. Upper lungs are clear. No visible effusion. No significant bone  finding. IMPRESSION: Bilateral mid and lower lung bronchopneumonia. Electronically Signed   By: Paulina Fusi M.D.   On: 05/04/2018 11:42    DISCHARGE EXAMINATION: Vitals:   05/08/18 2120 05/09/18 0509 05/09/18 0555 05/09/18 0925  BP:  121/75    Pulse:  (!) 113 (!) 101   Resp:  19    Temp:  97.6 F (36.4 C)    TempSrc:  Oral    SpO2: 99% 99%  98%  Weight:      Height:       General appearance: alert, cooperative and distracted Resp: Coarse breath sounds bilaterally.  Few crackles at the bases.  Normal effort at rest Cardio: regular rate and rhythm, S1, S2 normal, no murmur, click, rub or gallop GI: soft, non-tender; bowel sounds normal; no masses,  no organomegaly  DISPOSITION: Home with hospice  Discharge Instructions    Call MD for:  difficulty breathing, headache or visual disturbances   Complete by:  As directed    Call MD for:  extreme fatigue   Complete by:  As directed    Call MD for:  persistant dizziness or light-headedness   Complete by:  As directed    Call MD for:  persistant nausea and vomiting   Complete by:  As directed    Call MD for:  severe uncontrolled pain   Complete by:  As directed    Call MD for:  temperature >100.4   Complete by:  As directed    Discharge diet:   Complete by:  As directed    Dysphagia 3: Mechanical soft diet   Discharge instructions   Complete by:  As directed    You were cared for by a hospitalist during your hospital stay. If you have any questions about your discharge medications or the care you received while you were in the hospital after you are discharged, you can call the unit and asked to speak with the hospitalist on call if the hospitalist that took care of you is not available. Once you are discharged, your primary care physician will handle any further medical issues. Please note that NO REFILLS for any discharge medications will be authorized once you are discharged, as it is imperative that you return to your primary care  physician (or establish a relationship with a primary care physician if you do not have one) for your aftercare needs so that they can reassess your need for medications and monitor your lab values. If you do not have a primary care physician, you  can call [phone] for a physician referral.   Increase activity slowly   Complete by:  As directed         Allergies as of 05/09/2018   No Known Allergies     Medication List    STOP taking these medications   amLODipine 5 MG tablet Commonly known as:  NORVASC   gabapentin 800 MG tablet Commonly known as:  NEURONTIN   isosorbide mononitrate 30 MG 24 hr tablet Commonly known as:  IMDUR   traMADol 50 MG tablet Commonly known as:  ULTRAM     TAKE these medications   ACCU-CHEK SOFTCLIX LANCETS lancets Use as instructed   acetaminophen 325 MG tablet Commonly known as:  TYLENOL Take 650 mg by mouth every 6 (six) hours as needed.   amoxicillin-clavulanate 875-125 MG tablet Commonly known as:  AUGMENTIN Take 1 tablet by mouth every 12 (twelve) hours for 5 days.   aspirin EC 81 MG tablet Take 1 tablet (81 mg total) by mouth daily.   atorvastatin 40 MG tablet Commonly known as:  LIPITOR Take 1 tablet (40 mg total) by mouth daily.   baclofen 10 MG tablet Commonly known as:  LIORESAL Take 0.5 tablets (5 mg total) by mouth 3 (three) times daily as needed for muscle spasms.   budesonide-formoterol 160-4.5 MCG/ACT inhaler Commonly known as:  SYMBICORT Inhale 2 puffs into the lungs 2 (two) times daily.   DULoxetine 60 MG capsule Commonly known as:  CYMBALTA TAKE 1 CAPSULE BY MOUTH EVERY DAY What changed:    how much to take  how to take this  when to take this  additional instructions   folic acid 1 MG tablet Commonly known as:  FOLVITE Take 1 tablet (1 mg total) by mouth daily.   glucose blood test strip Commonly known as:  ONETOUCH VERIO Use to test blood sugar three-four times daily.   HYDROcodone-acetaminophen  7.5-325 MG tablet Commonly known as:  NORCO Take 1 tablet by mouth every 8 (eight) hours.   insulin aspart 100 UNIT/ML injection Commonly known as:  novoLOG Inject 6 Units into the skin 3 (three) times daily with meals.   Insulin Glargine 100 UNIT/ML Solostar Pen Commonly known as:  LANTUS SOLOSTAR Inject 20 Units into the skin at bedtime.   INSULIN SYRINGE .5CC/31GX5/16" 31G X 5/16" 0.5 ML Misc USE AS DIRECTED WITH LANTUS   ipratropium-albuterol 0.5-2.5 (3) MG/3ML Soln Commonly known as:  DUONEB Take 3 mLs by nebulization every 6 (six) hours as needed. What changed:  reasons to take this   LORazepam 2 MG/ML concentrated solution Commonly known as:  ATIVAN Take 0.3 mLs (0.6 mg total) by mouth every 8 (eight) hours as needed for anxiety.   metoprolol tartrate 50 MG tablet Commonly known as:  LOPRESSOR Take 1 tablet (50 mg total) by mouth 2 (two) times daily. What changed:    how much to take  when to take this   morphine CONCENTRATE 10 mg / 0.5 ml concentrated solution Take 0.5 mLs (10 mg total) by mouth every 2 (two) hours as needed for moderate pain, severe pain, anxiety or shortness of breath.   polyethylene glycol packet Commonly known as:  MIRALAX / GLYCOLAX Take 17 g by mouth daily. What changed:    when to take this  reasons to take this   tamsulosin 0.4 MG Caps capsule Commonly known as:  FLOMAX Take 1 capsule (0.4 mg total) by mouth daily after breakfast. NEED OV.   vitamin B-12 1000 MCG  tablet Commonly known as:  CYANOCOBALAMIN Take 1,000 mcg by mouth daily.        Follow-up Information    SwazilandJordan, [name], MD Follow up.   Specialty:  Family Medicine Contact information: [address] [place] [id number] [phone]           TOTAL DISCHARGE TIME: 35 minutes  Osvaldo ShipperGokul Marykate Heuberger  [facility] Pager [phone]  05/09/2018, 10:31 AM

## 2018-05-09 NOTE — Discharge Instructions (Signed)
Dysphagia Diet Level 3, Mechanically Advanced °The dysphagia level 3 diet includes foods that are soft, moist, and can be chopped into 1-inch chunks. This diet is helpful for people with mild swallowing difficulties. It reduces the risk of food getting caught in the windpipe, trachea, or lungs. °What do I need to know about this diet? °· You may eat foods that are soft and moist. °· If you were on the dysphagia level 1 or level 2 diets, you may eat any of the foods included on those lists. °· Avoid foods that are dry, hard, sticky, chewy, coarse, and crunchy. Also avoid large cuts of food. °· Take small bites. Each bite should contain 1 inch or less of food. °· Thicken liquids if instructed by your health care provider. Follow your health care provider's instructions on how to do this and to what consistency. °· See your dietitian or speech language pathologist regularly for help with your dietary changes. °What foods can I eat? °Grains °Moist breads without nuts or seeds. Biscuits, muffins, pancakes, and waffles well-moistened with syrup, jelly, margarine, or butter. Smooth cereals with plenty of milk to moisten them. Moist bread stuffing. Moist rice. °Vegetables °All cooked, soft vegetables. Shredded lettuce. Tender fried potatoes. °Fruits °All canned and cooked fruits. Soft, peeled fresh fruits, such as peaches, nectarines, kiwis, cantaloupe, honeydew melon, and watermelon without seeds. Soft berries, such as strawberries. °Meat and Other Protein Sources °Moist ground or finely diced or sliced meats. Solid, tender cuts of meat. Meatloaf. Hamburger with a bun. Sausage patty. Deli thin-sliced lunch meat. Chicken, egg, or tuna salad sandwich. Sloppy joe. Moist fish. Eggs prepared any way. Casseroles with small chunks of meats, ground meats, or tender meats. °Dairy °Cheese spreads without coarse large chunks. Shredded cheese. Cheese slices. Cottage cheese. Milk at the right texture. Smooth frappes. Yogurt without  nuts or coconut. Ask your health care provider whether you can have frozen desserts (such as malts or milk shakes) and thin liquids. °Sweets/Desserts °Soft, smooth, moist desserts. Non-chewy, smooth candy. Jam. Jelly. Honey. Preserves. Ask your health care provider whether you can have frozen desserts. °Fats and Oils °Butter. Oils. Margarine. Mayonnaise. Gravy. Spreads. °Other °All seasonings and sweeteners. All sauces without large chunks. °The items listed above may not be a complete list of recommended foods or beverages. Contact your dietitian for more options. °What foods are not recommended? °Grains °Coarse or dry cereals. Dry breads. Toast. Crackers. Tough, crusty breads, such French bread and baguettes. Tough, crisp fried potatoes. Potato skins. Dry bread stuffing. Granola. Popcorn. Chips. °Vegetables °All raw vegetables except shredded lettuce. Cooked corn. Rubbery or stiff cooked vegetables. Stringy vegetables, such as celery. °Fruits °Hard fruits that are difficult to chew, such as apples or pears. Stringy, high-pulp fruits, such as pineapple, papaya, or mango. Fruits with tough skins, such as grapes. Coconut. All dried fruits. Fruit leather. Fruit roll-ups. Fruit snacks. °Meat and Other Protein Sources °Dry or tough meats or poultry. Dry fish. Fish with bones. Peanut butter. All nuts and seeds. °Dairy °Any with nuts, seeds, chocolate chips, dried fruit, coconut, or pineapple. °Sweets/Desserts °Dry cakes. Chewy or dry cookies. Any with nuts, seeds, dry fruits, coconut, pineapple, or anything dry, sticky, or hard. Chewy caramel. Licorice. Taffy-type candies. Ask your health care provider whether you can have frozen desserts. °Fats and Oils °Any with chunks, nuts, seeds, or pineapple. Olives. Pickles. °Other °Soups with tough or large chunks of meats, poultry, or vegetables. Corn or clam chowder. °The items listed above may not be a   complete list of foods and beverages to avoid. Contact your dietitian for  more information. °This information is not intended to replace advice given to you by your health care provider. Make sure you discuss any questions you have with your health care provider. °Document Released: 05/14/2005 Document Revised: 10/20/2015 Document Reviewed: 04/27/2013 °Elsevier Interactive Patient Education © 2018 Elsevier Inc. ° °

## 2018-05-09 NOTE — Consult Note (Signed)
   [facility]   05/09/2018  Jeremy Gill February 02, 1946 [id number]    Mr. Jeremy Gill discharged home today with hosp[facility]. There are no identifiable [facility] needs at this time.   Jeremy NobleAtika Lichelle Viets, MSN-Ed, RN,BSN [facility] Hospital Liaison [phone]

## 2018-05-09 NOTE — Telephone Encounter (Signed)
Variable authorization can be given for skilled nursing, social work, and PT as requested. I can sign hospice orders, if needed. Thanks, BJ

## 2018-05-09 NOTE — Progress Notes (Signed)
Patient given discharge instructions, and verbalized an understanding of all discharge instructions.  Patient agrees with discharge plan, and is being discharged in stable medical condition.  Patient given transportation via [facility]. 

## 2018-05-09 NOTE — Telephone Encounter (Signed)
Message sent to Dr. Jordan for review and approval. 

## 2018-05-09 NOTE — Telephone Encounter (Signed)
Spoke with Jeremy Gill and gave verbal orders as requested.

## 2018-05-09 NOTE — Care Management Note (Addendum)
Case Management Note  Patient Details  Name: Jeremy Gill MRN: [id number] Date of Birth: 11-14-1945  Subjective/Objective:                  Discharge planning  Action/Plan: [facility] equipment is in the home and ready for patient to be discharged to home with hospice care/will call [facility] for transport home when dcd. [id number]/[facility] for transport to home with o2 at 230pm today. Expected Discharge Date:                  Expected Discharge Plan:  Home w Hospice Care  In-House Referral:     Discharge planning Services  CM Consult  Post Acute Care Choice:    Choice offered to:     DME Arranged:    DME Agency:     HH Arranged:  RN HH Agency:  [facility]  Status of Service:  Completed, signed off  If discussed at [employer] Meetings, dates discussed:    Additional Comments:  Golda AcreDavis, Gerrod Maule Lynn, RN 05/09/2018, 10:07 AM

## 2018-05-16 ENCOUNTER — Telehealth: Payer: Self-pay | Admitting: *Deleted

## 2018-05-16 NOTE — Telephone Encounter (Signed)
FYI sent to Dr. SwazilandJordan.  Copied from CRM [id number]. Topic: General - Deceased Patient >> May 15, 2018  8:42 AM Windy KalataMichael, [place] L, NT wrote: Reason for CRM: Beth with [facility] wanted to notify the office that the patient has passed away today .  Route to department's PEC Pool.

## 2018-05-28 DEATH — deceased

## 2018-05-30 ENCOUNTER — Ambulatory Visit: Payer: PPO | Admitting: Physical Medicine & Rehabilitation

## 2019-10-23 IMAGING — CR DG CHEST 2V
4 series · 4 of 4 positions shown · non-contrast
Comparison: Chest x-ray of December 1927

CLINICAL DATA: Possible sepsis

EXAM:
CHEST - 2 VIEW

[x chest ap (1 of 2)]
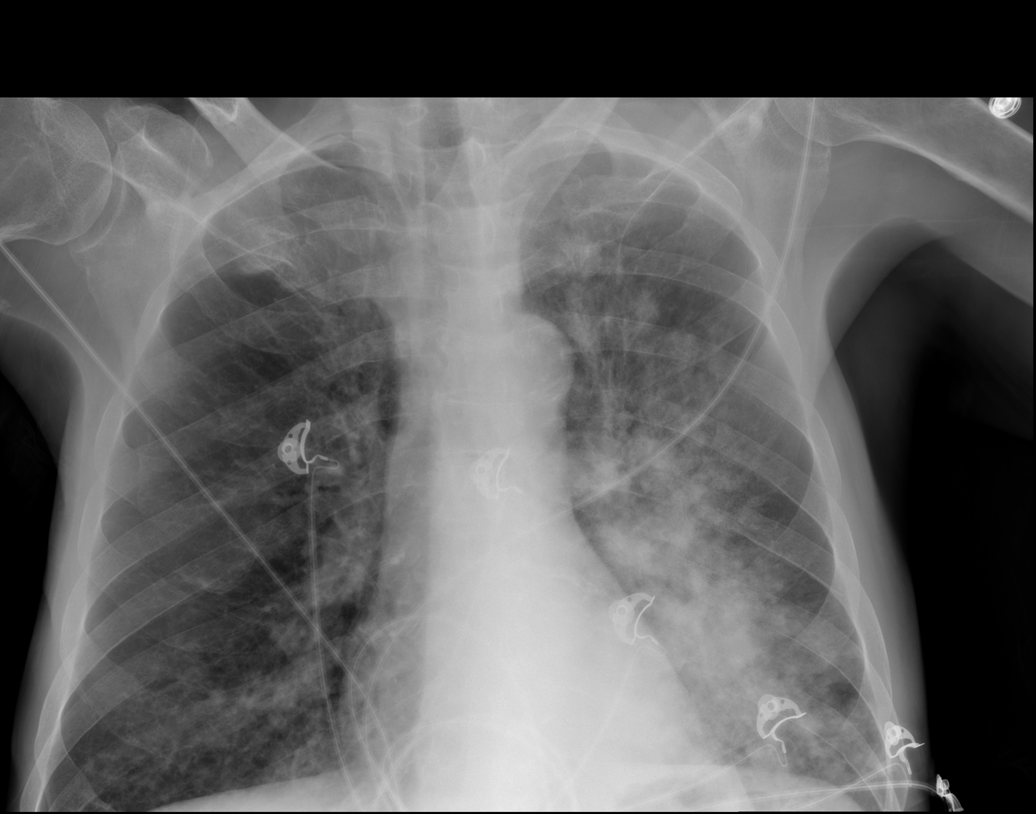

[x chest ap (2 of 2)]
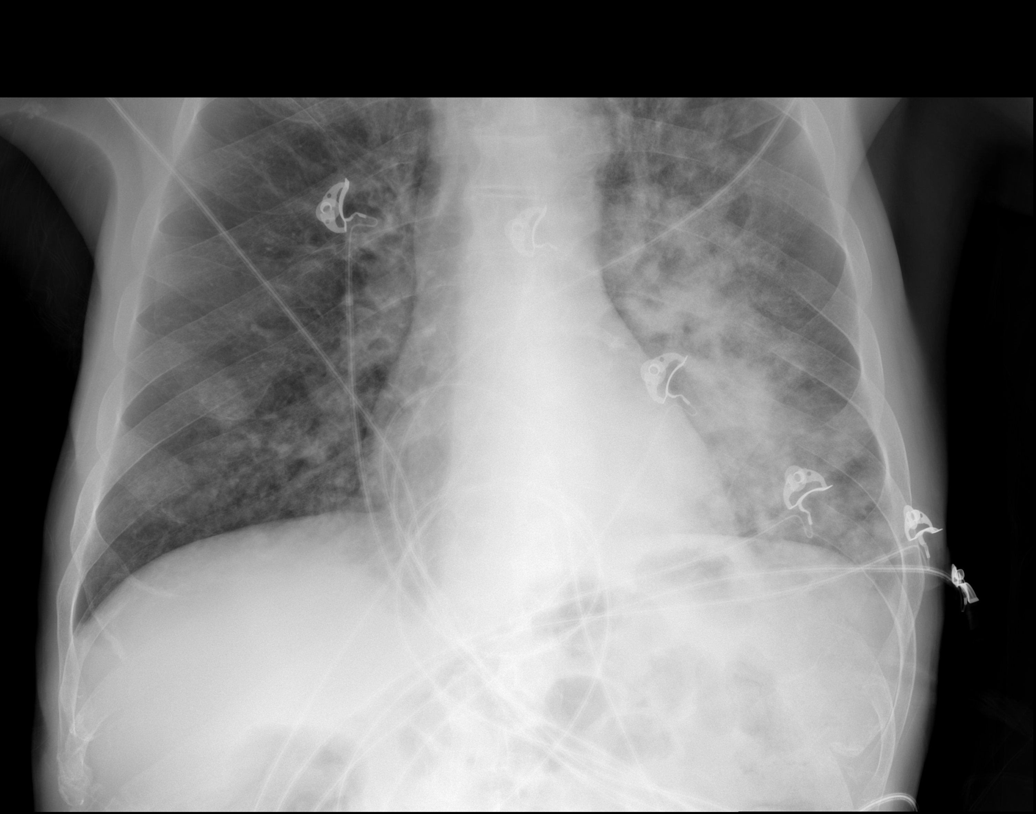

[w chest lat]
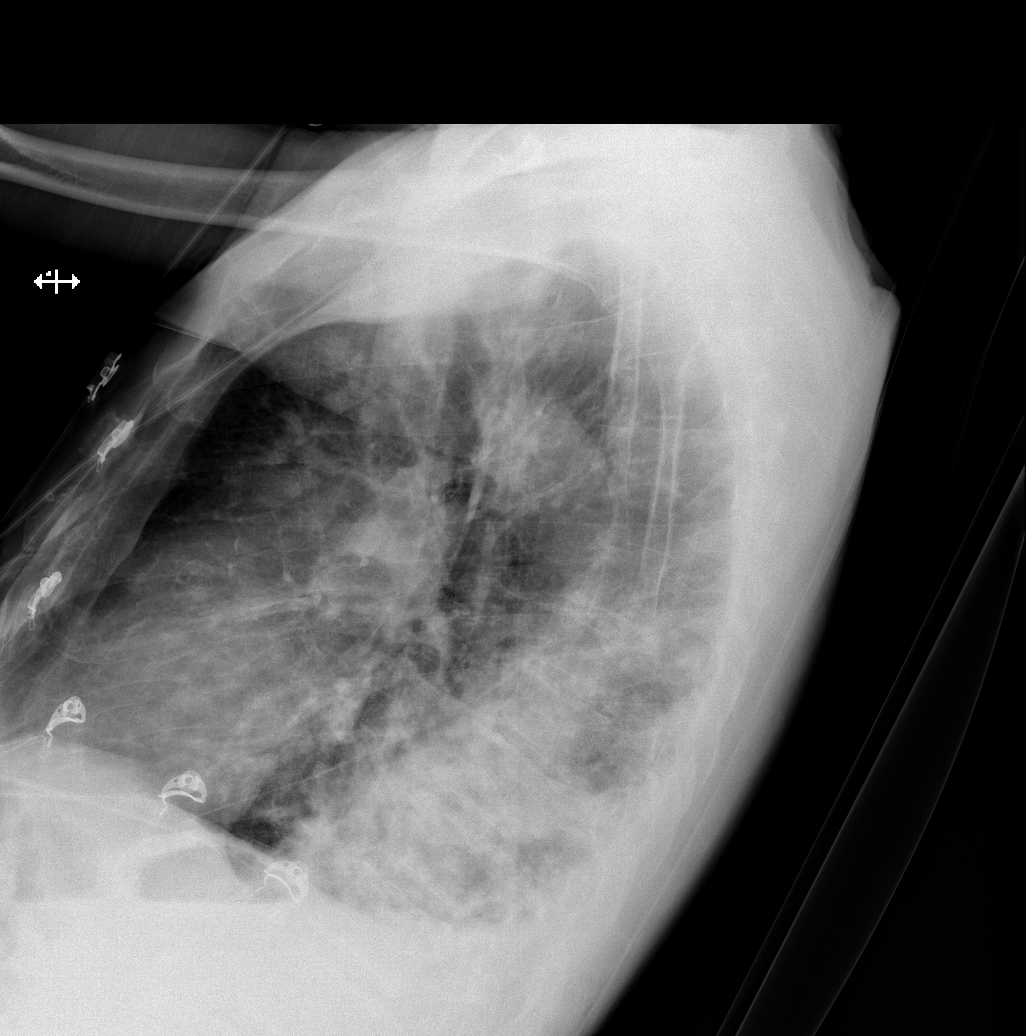

[w chest decub]
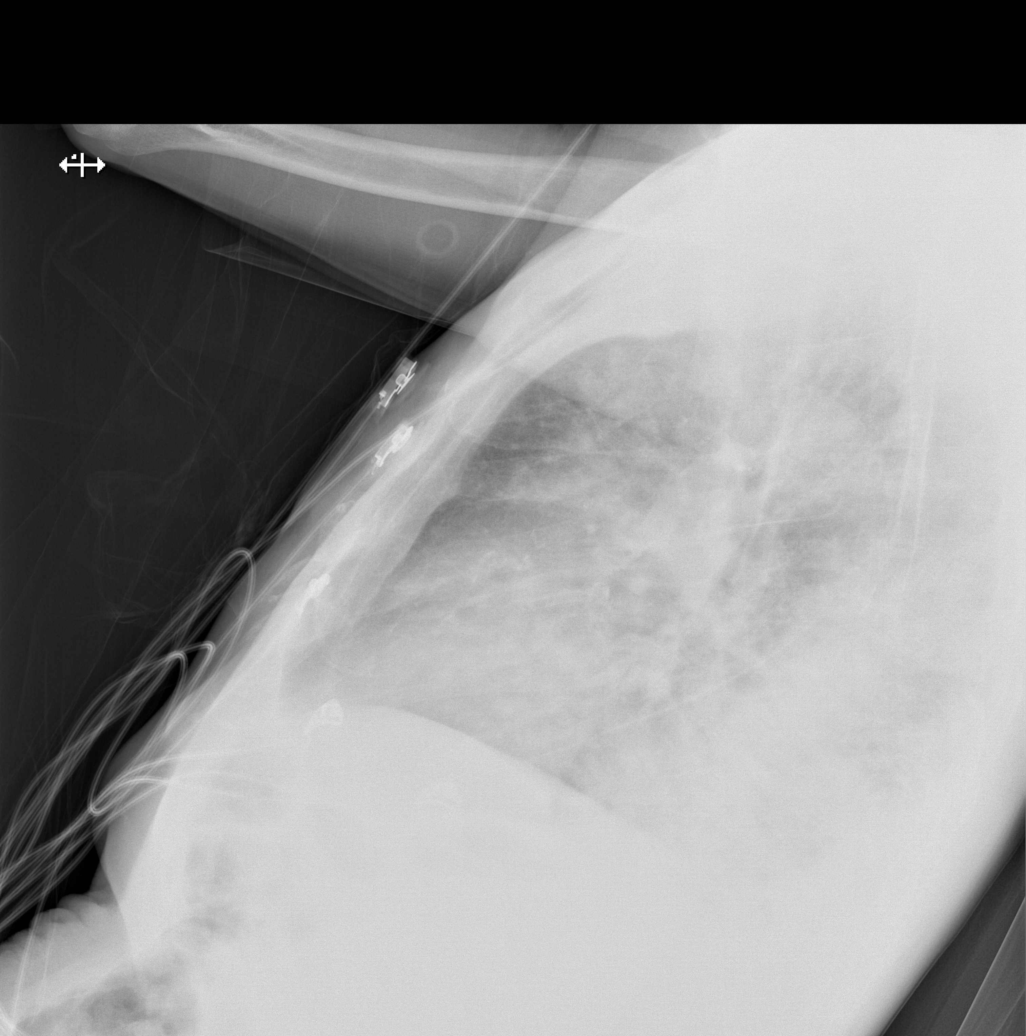

[4 of 4 positions shown; findings below may reference images not displayed]

FINDINGS: The lungs are mildly hyperinflated with hemidiaphragm flattening.
There is a new dense infiltrate in the left lower lobe posteriorly.
There are patchy airspace opacities elsewhere in the left lung and
to a lesser extent in the right lung worrisome for septic emboli.
The heart and pulmonary vascularity are normal. The mediastinum is
normal in width. There is calcification in the wall of the aortic
arch.
IMPRESSION: COPD. Left lower lobe pneumonia. Patchy airspace opacities elsewhere
in the left lung and to a lesser extent on the right worrisome for
multifocal pneumonia or septic emboli.

## 2019-10-23 IMAGING — CT CT HEAD W/O CM
3 series · 15 of 47 positions shown, 18 images · non-contrast
Comparison: Brain MRI 01/14/2018, head CT 01/13/2018 and earlier.

CLINICAL DATA: 72-year-old male with unexplained altered mental
status.

EXAM:
CT HEAD WITHOUT CONTRAST
TECHNIQUE: Contiguous axial images were obtained from the base of the skull
through the vertex without intravenous contrast.

[Series 2: head wo · axial · 0.47mm/px · z∈[+568,+703]mm · 9 of 33 slices shown, 12 images]
[im 3/33  brain]
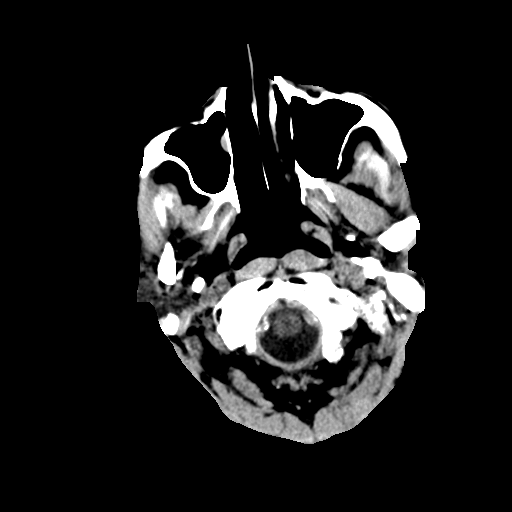
[im 3/33  bone]
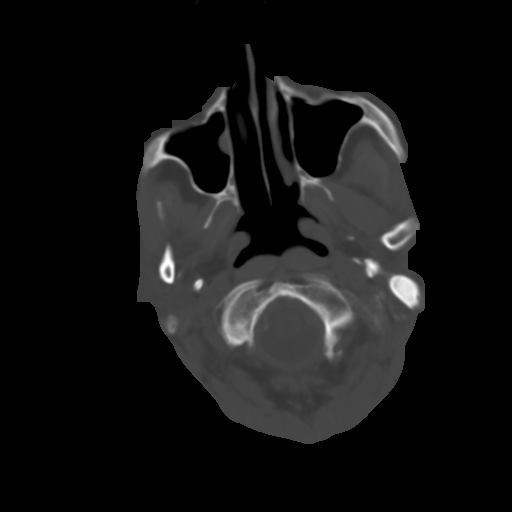
[im 6/33  brain]
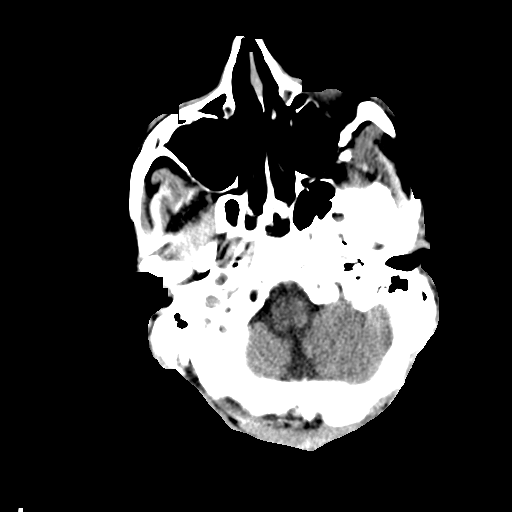
[im 9/33  brain]
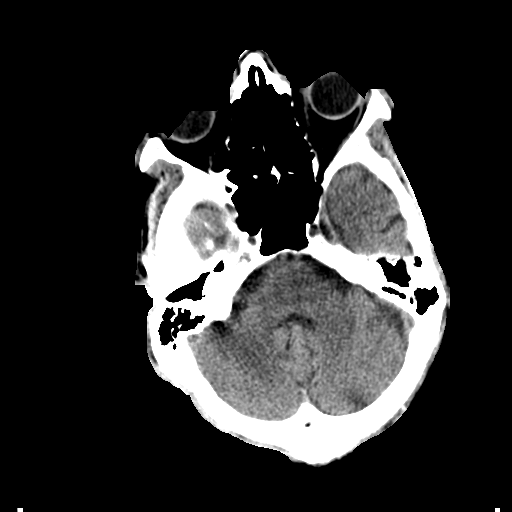
[im 13/33  brain]
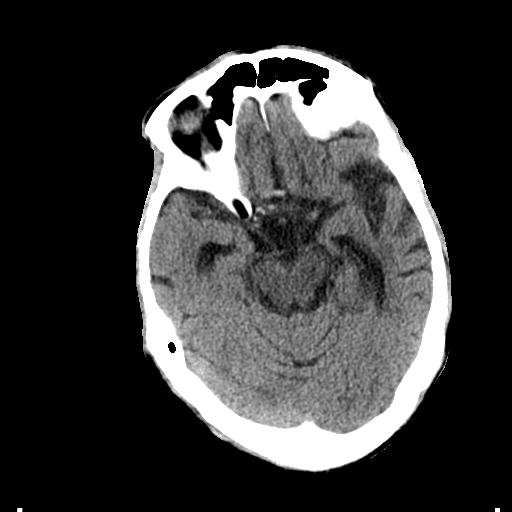
[im 17/33  brain]
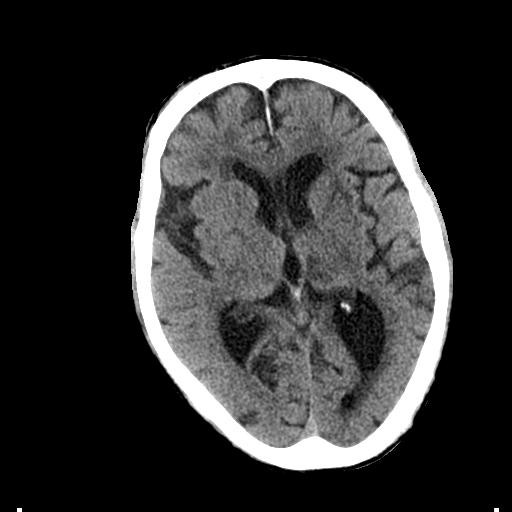
[im 17/33  bone]
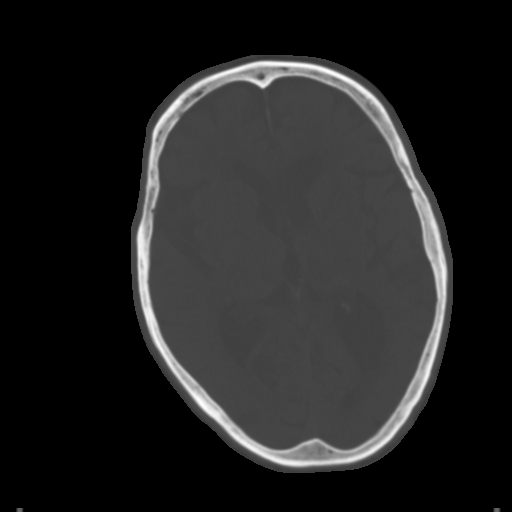
[im 20/33  brain]
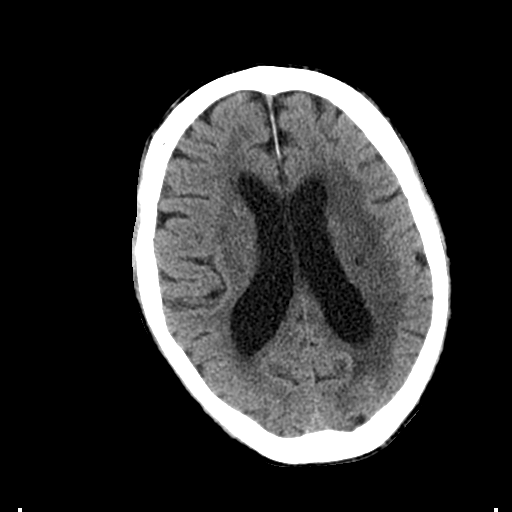
[im 24/33  brain]
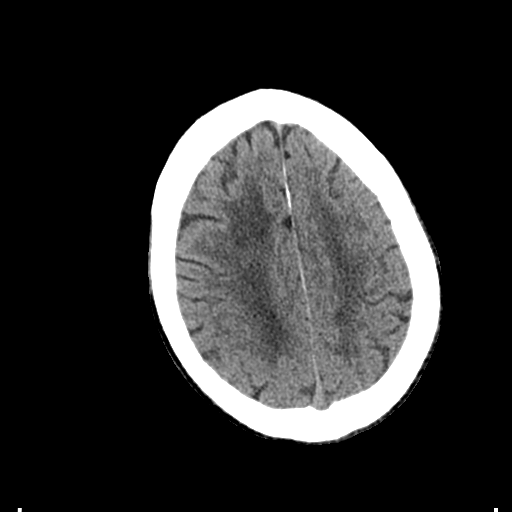
[im 27/33  brain]
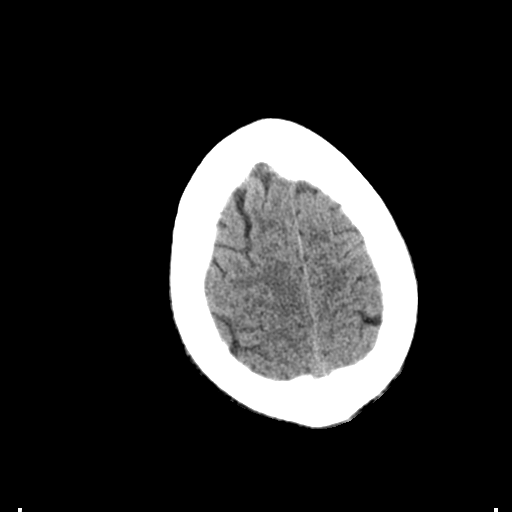
[im 30/33  brain]
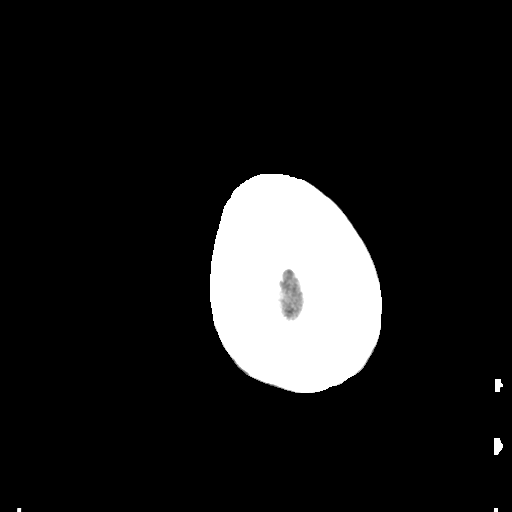
[im 30/33  bone]
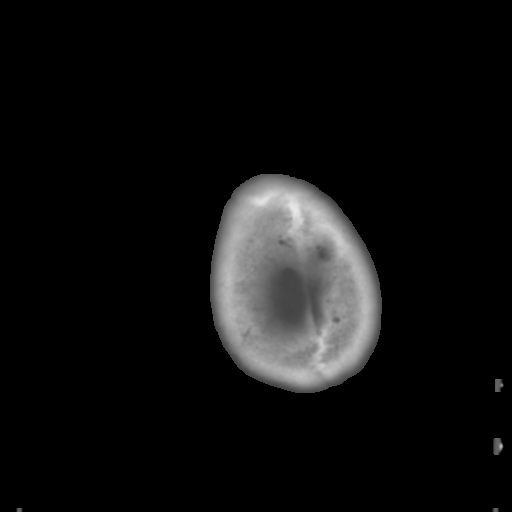

[Series 5: coronal soft tissue · coronal · 0.32mm/px · 3 of 70 slices shown]
[im 24/70  brain]
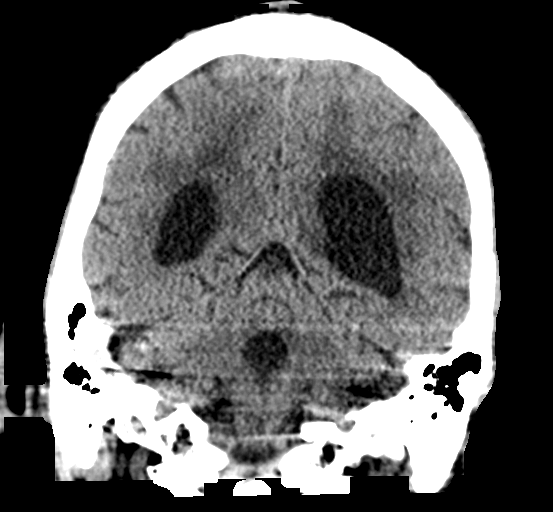
[im 31/70  brain]
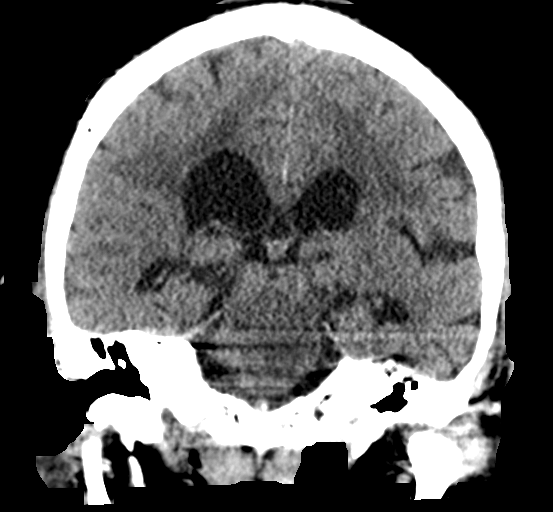
[im 39/70  brain]
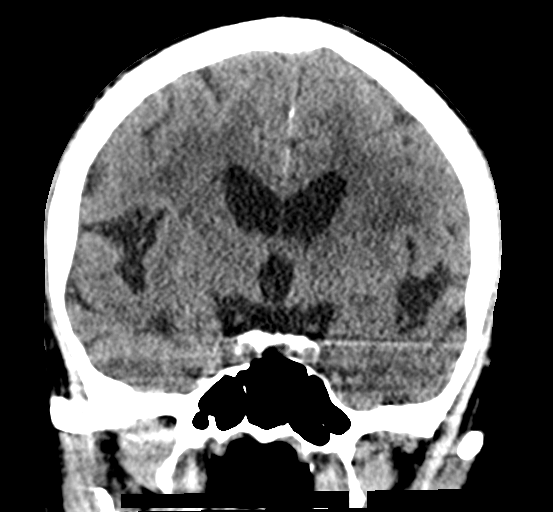

[Series 6: sagittal soft tissue · sagittal · 0.32mm/px · 3 of 59 slices shown]
[im 20/59  brain]
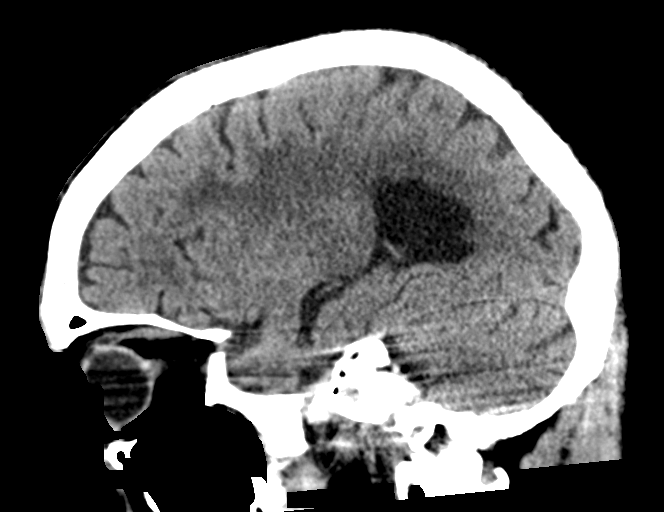
[im 30/59  brain]
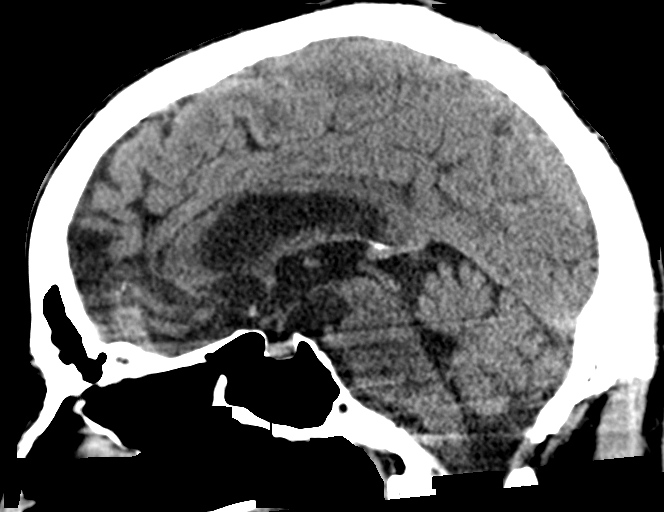
[im 39/59  brain]
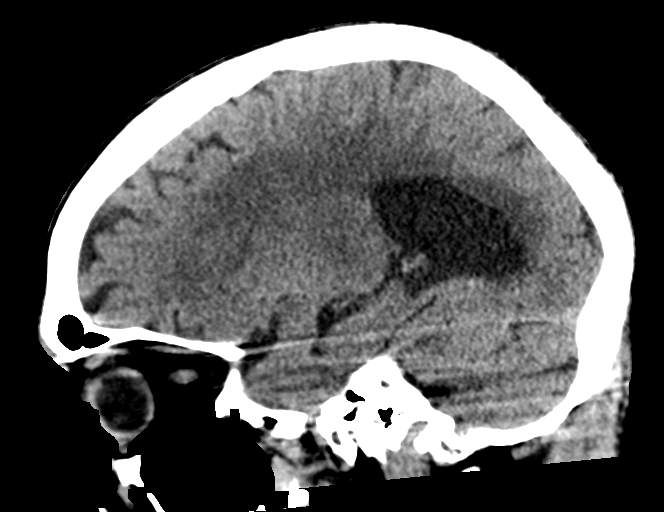

[15 of 47 positions shown; findings below may reference images not displayed]

FINDINGS: Brain: Chronic confluent cerebral white matter hypodensity, moderate
to severe heterogeneity in the left deep gray matter nuclei, and
pons. Stable gray-white matter differentiation throughout the brain.
No cortical encephalomalacia identified. No midline shift,
ventriculomegaly, mass effect, evidence of mass lesion, intracranial
hemorrhage or evidence of cortically based acute infarction.

Vascular: Calcified atherosclerosis at the skull base. No suspicious
intracranial vascular hyperdensity.

Skull: Negative.

Sinuses/Orbits: Visualized paranasal sinuses and mastoids are stable
and well pneumatized.

Other: Stable orbit and scalp soft tissues.
IMPRESSION: No acute intracranial abnormality.

Stable non contrast CT appearance of the brain with advanced chronic
small vessel disease.
